# Patient Record
Sex: Male | Born: 1950 | Race: Black or African American | Hispanic: No | Marital: Married | Smoking: Former smoker
Health system: Southern US, Community
[De-identification: ages and names within clinical notes are randomized; demographics above are authoritative.]

## PROBLEM LIST (undated history)

## (undated) DIAGNOSIS — E119 Type 2 diabetes mellitus without complications: Secondary | ICD-10-CM

## (undated) DIAGNOSIS — H43399 Other vitreous opacities, unspecified eye: Secondary | ICD-10-CM

## (undated) DIAGNOSIS — I219 Acute myocardial infarction, unspecified: Secondary | ICD-10-CM

## (undated) DIAGNOSIS — D649 Anemia, unspecified: Secondary | ICD-10-CM

## (undated) DIAGNOSIS — G20 Parkinson's disease: Secondary | ICD-10-CM

## (undated) DIAGNOSIS — G510 Bell's palsy: Secondary | ICD-10-CM

## (undated) DIAGNOSIS — M1990 Unspecified osteoarthritis, unspecified site: Secondary | ICD-10-CM

## (undated) DIAGNOSIS — I10 Essential (primary) hypertension: Secondary | ICD-10-CM

## (undated) HISTORY — DX: Type 2 diabetes mellitus without complications: E11.9

## (undated) HISTORY — DX: Essential (primary) hypertension: I10

## (undated) HISTORY — PX: BACK SURGERY: SHX140

## (undated) HISTORY — DX: Parkinson's disease: G20

## (undated) HISTORY — PX: KNEE SURGERY: SHX244

## (undated) HISTORY — PX: APPENDECTOMY: SHX54

---

## 1998-10-26 ENCOUNTER — Encounter: Payer: Self-pay | Admitting: Gastroenterology

## 1998-10-26 ENCOUNTER — Ambulatory Visit (HOSPITAL_COMMUNITY): Admission: RE | Admit: 1998-10-26 | Discharge: 1998-10-26 | Payer: Self-pay | Admitting: Gastroenterology

## 1999-11-09 ENCOUNTER — Ambulatory Visit (HOSPITAL_COMMUNITY): Admission: RE | Admit: 1999-11-09 | Discharge: 1999-11-09 | Payer: Self-pay | Admitting: *Deleted

## 1999-11-09 ENCOUNTER — Encounter: Payer: Self-pay | Admitting: Internal Medicine

## 2003-07-19 ENCOUNTER — Emergency Department (HOSPITAL_COMMUNITY): Admission: EM | Admit: 2003-07-19 | Discharge: 2003-07-19 | Payer: Self-pay | Admitting: Emergency Medicine

## 2003-07-27 ENCOUNTER — Emergency Department (HOSPITAL_COMMUNITY): Admission: EM | Admit: 2003-07-27 | Discharge: 2003-07-27 | Payer: Self-pay | Admitting: Emergency Medicine

## 2003-08-28 ENCOUNTER — Observation Stay (HOSPITAL_COMMUNITY): Admission: EM | Admit: 2003-08-28 | Discharge: 2003-08-29 | Payer: Self-pay | Admitting: Emergency Medicine

## 2004-08-02 ENCOUNTER — Ambulatory Visit (HOSPITAL_COMMUNITY): Admission: RE | Admit: 2004-08-02 | Discharge: 2004-08-02 | Payer: Self-pay | Admitting: Gastroenterology

## 2005-12-06 ENCOUNTER — Encounter: Admission: RE | Admit: 2005-12-06 | Discharge: 2005-12-06 | Payer: Self-pay | Admitting: Internal Medicine

## 2006-11-11 ENCOUNTER — Inpatient Hospital Stay (HOSPITAL_COMMUNITY): Admission: EM | Admit: 2006-11-11 | Discharge: 2006-11-14 | Payer: Self-pay | Admitting: Emergency Medicine

## 2012-06-14 DIAGNOSIS — G510 Bell's palsy: Secondary | ICD-10-CM | POA: Insufficient documentation

## 2012-08-19 HISTORY — PX: CORONARY ANGIOPLASTY WITH STENT PLACEMENT: SHX49

## 2012-09-21 DIAGNOSIS — I259 Chronic ischemic heart disease, unspecified: Secondary | ICD-10-CM | POA: Insufficient documentation

## 2013-09-05 DIAGNOSIS — N3281 Overactive bladder: Secondary | ICD-10-CM | POA: Insufficient documentation

## 2016-02-26 DIAGNOSIS — I252 Old myocardial infarction: Secondary | ICD-10-CM | POA: Insufficient documentation

## 2016-02-26 DIAGNOSIS — I25118 Atherosclerotic heart disease of native coronary artery with other forms of angina pectoris: Secondary | ICD-10-CM | POA: Insufficient documentation

## 2016-02-26 DIAGNOSIS — R0602 Shortness of breath: Secondary | ICD-10-CM | POA: Insufficient documentation

## 2016-04-27 ENCOUNTER — Encounter: Payer: Self-pay | Admitting: Neurology

## 2016-04-27 ENCOUNTER — Ambulatory Visit (INDEPENDENT_AMBULATORY_CARE_PROVIDER_SITE_OTHER): Payer: Medicare HMO | Admitting: Neurology

## 2016-04-27 VITALS — BP 142/78 | HR 72 | Resp 16 | Ht 69.0 in | Wt 200.0 lb

## 2016-04-27 DIAGNOSIS — G20 Parkinson's disease: Secondary | ICD-10-CM | POA: Diagnosis not present

## 2016-04-27 NOTE — Progress Notes (Signed)
Subjective:    Patient ID: Ronald [name] is a 65 y.o. male.  HPI     Ronald [name] , MD, PhD [facility] [address], [address] [address] [place], [place] [address]  Dear Dr. Melvyn [name],  I saw your patient, Ronald [name], upon your kind request in my neurologic clinic today for initial consultation of his Parkinson's disease. The patient is unaccompanied today. As you know, Mr. Ronald [name] is a 65 yo Left-handed gentleman with an underlying medical history of diabetes, Bell's palsy some 10 years ago, overweight state, and hypertension who was diagnosed with Parkinson's disease in 2014. Symptoms date back to beginning of 2014, and started with speech changes, was noted to dragging his feet.  He has been on Sinemet for the past 3 years, with gradual increase, currently 2 pills in the morning, 2 in the afternoon and 1 at night, was asked to reduce it, He is not sure why, seems to tolerate it well and believes it has helped. He is wondering however if there is any new medication available. He has not been on a dopamine agonist before from what I understand, no Azilect, no other MAO B inhibitor either.  He exercises regularly and is enrolled in the PD spinning class at 2 different [employer]. Lives with his wife of 37 years in [place], and they have 3 grown daughters, 2 GC. He is a nonsmoker, drinks alcohol infrequently, is a retired Environmental managerphotographer.  I reviewed your office note from 02/08/2016. He used to see Dr. Ala [name], neurologist out of [facility] by Dr. Ala [name] retired. He then saw another neurologist in the same office but she left. Prior office records from his previous neurologists are not available for my review today. We will request office records from Dr. Marily [name]'s office, patient signed a ROI form today and is agreeable.  He denies any major memory or mood issues or sleep issues, does snore some, but no apneas reported, no hx of RBD. No FHx of PD.  He varies his C/L dose, 1st dose  6-9 AM, second dose around 12 or 1 PM and last dose around 8 PM.   He also plays golf, once or twice a week. He tries to stay well-hydrated, may not always drink enough water however.  His Past Medical History Is Significant For: Past Medical History:  Diagnosis Date  . Diabetes mellitus without complication (HCC)   . Hypertension   . Parkinson's disease (HCC)     His Past Surgical History Is Significant For: Past Surgical History:  Procedure Laterality Date  . APPENDECTOMY    . CORONARY ANGIOPLASTY WITH STENT PLACEMENT  08/19/2012  . KNEE SURGERY      His Family History Is Significant For: Family History  Problem Relation Age of Onset  . Diabetes Mother   . Stroke Father     His Social History Is Significant For: Social History   Social History  . Marital status: Married    Spouse name: Ronald [name]   . Number of children: 3  . Years of education: N/A   Occupational History  . Retired     Social History Main Topics  . Smoking status: Former Games developermoker  . Smokeless tobacco: None  . Alcohol use Yes  . Drug use: No  . Sexual activity: Not Asked   Other Topics Concern  . None   Social History Narrative   Drinks about 1 cup of coffee a day     His Allergies Are:  No Known Allergies:  His Current Medications Are:  Outpatient Encounter Prescriptions as of 04/27/2016  Medication Sig  . aspirin 81 MG chewable tablet Chew by mouth.  . carbidopa-levodopa (SINEMET IR) 25-100 MG tablet Take 2 tablets by mouth. Take 2 in am, 2 afternoon, and 1 at night.  . Cholecalciferol (VITAMIN D3) 2000 units TABS Take by mouth.  Marland Kitchen lisinopril (PRINIVIL,ZESTRIL) 5 MG tablet Take 1/2 tablet daily  . meloxicam (MOBIC) 7.5 MG tablet   . metFORMIN (GLUCOPHAGE) 500 MG tablet TAKE ONE TABLET BY MOUTH ONCE DAILY WITH BREAKFAST  . metoprolol succinate (TOPROL-XL) 25 MG 24 hr tablet Take by mouth.  . oxybutynin (DITROPAN) 5 MG tablet TAKE ONE TABLET BY MOUTH TWICE DAILY NEEDS  APPT  . simvastatin  (ZOCOR) 40 MG tablet TAKE ONE TABLET BY MOUTH IN THE EVENING   No facility-administered encounter medications on file as of 04/27/2016.   :   Review of Systems:  Out of a complete 14 point review of systems, all are reviewed and negative with the exception of these symptoms as listed below:  Review of Systems  Neurological:       Patient states that he was diagnosed with Parkinson's Disease in 2014 by Ronald [name] at [facility]. Ronald [name] has since retired.  Patient currently takes Sinemet 25-100, 2 tabs in the morning, 2 in the afternoon, and 1 at night.  Patient would like to ask if there are any new treatments for Parkinson's? Patient participates in 2 cycling classes for people with Parkinson's/     Objective:  Neurologic Exam  Physical Exam Physical Examination:   Vitals:   04/27/16 1054  BP: (!) 142/78  Pulse: 72  Resp: 16    General Examination: The patient is a very pleasant 65 y.o. male in no acute distress.  HEENT: Normocephalic, atraumatic, pupils are equal, round and reactive to light and accommodation. Funduscopic exam is normal with sharp disc margins noted. Extraocular tracking shows mild saccadic breakdown without nystagmus noted. There is limitation to upper gaze. There is mild decrease in eye blink rate. Hearing is intact. Face is asymmetric with mild L facial weakness and intermittent L facial twitching. There is mild facial masking and normal facial sensation. There is no lip, neck or jaw tremor. Neck is mildly rigid with intact passive ROM. There are no carotid bruits on auscultation. Oropharynx exam reveals moderate mouth dryness. No significant airway crowding is noted. Mallampati is class II. Tongue protrudes centrally and palate elevates symmetrically. There is no drooling.   Chest: is clear to auscultation without wheezing, rhonchi or crackles noted.  Heart: sounds are regular and normal without murmurs, rubs or gallops noted.   Abdomen: is soft, non-tender  and non-distended with normal bowel sounds appreciated on auscultation.  Extremities: There is no pitting edema in the distal lower extremities bilaterally. Pedal pulses are intact.   Skin: is warm and dry with no trophic changes noted.   Musculoskeletal: exam reveals no obvious joint deformities, tenderness, joint swelling or erythema.  Neurologically:  Mental status: The patient is awake and alert, paying good  attention. He is able to completely provide the history. He is oriented to: person, place, time/date, situation, day of week, month of year and year. His memory, attention, language and knowledge are intact. There is no aphasia, agnosia, apraxia or anomia. There is a no significant bradyphrenia. Speech is mildly hypophonic with no dysarthria noted. Mood is congruent and affect is normal.   Cranial nerves are as described above under HEENT exam. In  addition, shoulder shrug is normal with equal shoulder height noted.  Motor exam: Normal bulk, and strength for age is noted. There are no dyskinesias noted.   he has a mild increase in tone on the right, left muscle tone is fairly normal appearing. He has no significant tremor today, in particular, no resting tremor is noted. Fine motor-wise, he has mild to moderate difficulties on the right with finger taps, hand movements, rapid alternating patting, foot taps and foot agility. Findings arm mild on the left side. Romberg is negative. Reflexes are 1+ throughout. Sensory exam is intact to all modalities in the upper and lower extremities.  Cerebellar testing shows no dysmetria or intention tremor on finger to nose testing. Heel to shin is unremarkable bilaterally. There is no truncal or gait ataxia.   Gait, station and balance: He stands up from the seated position withvery mild difficulty and posture is mildly stooped for age. He has decreased in arm swing while walking, right side more pronounced than left, he has no significant difficulty with  turns and balance is preserved. Tandem walk however is difficult for him.   Assessment and Plan:    In summary, Ronald [name] is a very pleasant 65 y.o.-year old male with an underlying medical history of diabetes, Bell's palsy some 10 years ago, overweight state, and hypertension who  presents for initial consultation and transfer of care for his parkinsonism. His history and physical exam are in keeping with right-sided predominant Parkinson's disease with overall fairly mild to moderate findings, fairly preserved balance, no significant mood disorder, or associated sleep disorder or motor complications. I had a long discussion with the patient about his symptoms, his diagnosis and physical findings. We talked about prognosis as well and treatment options in the future. He has been on levodopa therapy a total of 5 pills at this time. He does not take his medication on a set schedule. We talked about medical treatments and non-pharmacological approaches. We talked about maintaining a healthy lifestyle in general. I encouraged the patient to eat healthy, exercise daily and keep well hydrated, to keep a scheduled bedtime and wake time routine, to not skip any meals and eat healthy snacks in between meals and to have protein with every meal. In particular, I stressed the importance of regular exercise, within of course the patient's own mobility limitations.   As far as further diagnostic testing is concerned, I suggested:I will request office records from his previous neurologist. We may consider doing an MRI in the near future. He may have had an MRI in the remote past when he had his Bell's palsy. He recalls that he was treated for his Bell's palsy with medication including steroids at the time and he has evidence of residual facial weakness.  As far as medications are concerned, I recommended the following at this time: I suggested he continue with Sinemet at the current dose but suggested he try to adhere  to a more set schedule. To that end, I asked him to take 2 pills at 9 AM, 2 pills at 2 PM, and 2 pills at 7 PM. He is advised to stay well-hydrated with water.  There are some additional medications we can consider in the near future. For now, we mutually agreed to not rock the boat. In addition, I would like to see what he may have tried in the past. I would like to see him back in 4 months, sooner as needed. I answered all his questions today and  he was in agreement.   Thank you very much for allowing me to participate in the care of this nice patient. If I can be of any further assistance to you please do not hesitate to call me at [phone].  Sincerely,   Ronald Foley, MD, PhD

## 2016-04-27 NOTE — Patient Instructions (Addendum)
I think your Parkinson's disease has remained fairly stable, which is reassuring. Nevertheless, as you know, this disease does progress with time. It can affect your balance, your memory, your mood, your bowel and bladder function, your posture, balance and walking and your activities of daily living. However, there are good supportive treatments and symptomatic treatments available, so most patients have a change to a good quality life and life expectancy is not typically altered. Overall you are doing fairly well but I do want to suggest a few things today:  Remember to drink plenty of fluid at least 6 glasses (8 oz each), eat healthy meals and do not skip any meals. Try to eat protein with a every meal and eat a healthy snack such as fruit or nuts in between meals. Try to keep a regular sleep-wake schedule and try to exercise daily, particularly in the form of walking, 20-30 minutes a day, if you can.   Taking your medication on schedule is key.   Try to stay active physically and mentally. Engage in social activities in your community and with your family and try to keep up with current events by reading the newspaper or watching the news. Try to do word puzzles and you may like to do puzzles and brain games on the computer such as on [email or web address].   As far as your medications are concerned, I would like to suggest that you take your current medication with the following additional changes:  Take your sinemet at 9 AM, 2 PM and 7 PM.    As far as diagnostic testing, I will order: no test as yet, we may do a brain MRI in the near future.   We will request records from Dr. Marily LenteFord's office.   I would like to see you back in 4 months, sooner if we need to. Please call us with any interim questions, concerns, problems, updates or refill requests.  Our phone number is [phone]. We also have an after hours call service for urgent matters and there is a physician on-call for urgent questions, that  cannot wait till the next work day. For any emergencies you know to call 911 or go to the nearest emergency room.   You can email me through my chart and also leave a phone message for Lafonda Mossesiana, my nurse.

## 2016-06-30 DIAGNOSIS — M5136 Other intervertebral disc degeneration, lumbar region: Secondary | ICD-10-CM | POA: Insufficient documentation

## 2016-07-06 ENCOUNTER — Telehealth: Payer: Self-pay | Admitting: Neurology

## 2016-07-06 MED ORDER — CARBIDOPA-LEVODOPA 25-100 MG PO TABS: 2.0000 | ORAL_TABLET | Freq: Three times a day (TID) | ORAL | 5 refills | Status: DC

## 2016-07-06 NOTE — Telephone Encounter (Signed)
I called patient back and confirmed that he is taking Car/Levo 25-100 mg 2 tab 3 times a day. Ok to refill? Pharmacy is correct in chart.

## 2016-07-06 NOTE — Telephone Encounter (Signed)
Patient requesting refill of carb/levo 25-100mg  qty 180. This was originally filled by Rosario JacksElaine Feraru, MD. but left the practise and he now sees Dr. Frances FurbishAthar. He is good until the end of the week then will run out. He uses [facility] in [place]. Best call back is [phone]

## 2016-07-06 NOTE — Telephone Encounter (Signed)
Patient is returning your call.  

## 2016-07-06 NOTE — Telephone Encounter (Signed)
Rx placed for C/L 2 pills tid.

## 2016-07-06 NOTE — Addendum Note (Signed)
Addended by: Huston FoleyATHAR,  on: 07/06/2016 02:06 PM   Modules accepted: Orders

## 2016-07-06 NOTE — Telephone Encounter (Signed)
I LM for patient to call back. I need to confirm how the patient is taking the Carb/Levo. Qty below does not match with what he told us he was taking at last office visit.

## 2016-08-29 ENCOUNTER — Ambulatory Visit: Payer: Medicare HMO | Admitting: Neurology

## 2016-09-08 ENCOUNTER — Encounter (INDEPENDENT_AMBULATORY_CARE_PROVIDER_SITE_OTHER): Payer: Self-pay | Admitting: Orthopaedic Surgery

## 2016-09-08 ENCOUNTER — Ambulatory Visit (INDEPENDENT_AMBULATORY_CARE_PROVIDER_SITE_OTHER): Payer: Medicare HMO | Admitting: Orthopaedic Surgery

## 2016-09-08 ENCOUNTER — Ambulatory Visit (INDEPENDENT_AMBULATORY_CARE_PROVIDER_SITE_OTHER): Payer: Medicare HMO

## 2016-09-08 VITALS — Ht 69.5 in | Wt 195.0 lb

## 2016-09-08 DIAGNOSIS — M25551 Pain in right hip: Secondary | ICD-10-CM

## 2016-09-08 NOTE — Progress Notes (Signed)
   Office Visit Note   Patient: Ronald [name]           Date of Birth: 11/19/1950           MRN: [id number] Visit Date: 09/08/2016              Requested by: Ronald Chenerrance [name], MD [address] [place], [place] [address] PCP: Ronald ShearerJOHNS, TERRENCE, MD   Assessment & Plan: Visit Diagnoses: Osteoarthritis right hip. We will plan to have Dr. Alvester [name] perform right intra-articular cortisone injection. We'll see back in 1 month  Orders:  No orders of the defined types were placed in this encounter.  No orders of the defined types were placed in this encounter.     Procedures: No procedures performed   Clinical Data: No additional findings.   Subjective: No chief complaint on file.   Pt having right groin pain x 2 months. Hard to walk and all the time.  Pt also has Parkinson's    Right groin pain was insidious in onset without history of injury or trauma. Pain seems to be worse after being on his feet or walking for any length of time. There is no referred pain from his lumbar spine nor is there any pain into the right thigh or knee. Prior history of knee osteoarthritis which presently is asymptomatic.Marland Kitchen. No history of injury or trauma  Ronald [name] has a history of Parkinson's which is relatively well controlled. Review of Systems   Objective: Vital Signs: There were no vitals taken for this visit.  Physical Exam  Ortho Exam straight leg raise is negative bilaterally reflexes were symmetrical. Mild pain at the extreme of internal/external rotation of the right hip but not the left no crepitation. No obvious atrophy of one side compared to the other.  Specialty Comments:  No specialty comments available.  Imaging: No results found.   PMFS History: There are no active problems to display for this patient.  Past Medical History:  Diagnosis Date  . Diabetes mellitus without complication (HCC)   . Hypertension   . Parkinson's disease (HCC)     Family History  Problem  Relation Age of Onset  . Diabetes Mother   . Stroke Father     Past Surgical History:  Procedure Laterality Date  . APPENDECTOMY    . CORONARY ANGIOPLASTY WITH STENT PLACEMENT  08/19/2012  . KNEE SURGERY     Social History   Occupational History  . Retired     Social History Main Topics  . Smoking status: Former Games developermoker  . Smokeless tobacco: Not on file  . Alcohol use Yes  . Drug use: No  . Sexual activity: Not on file

## 2016-09-26 ENCOUNTER — Ambulatory Visit (INDEPENDENT_AMBULATORY_CARE_PROVIDER_SITE_OTHER): Payer: Medicare HMO | Admitting: Physical Medicine and Rehabilitation

## 2016-09-26 ENCOUNTER — Encounter (INDEPENDENT_AMBULATORY_CARE_PROVIDER_SITE_OTHER): Payer: Self-pay | Admitting: Physical Medicine and Rehabilitation

## 2016-09-26 VITALS — BP 155/87 | HR 61

## 2016-09-26 DIAGNOSIS — M25551 Pain in right hip: Secondary | ICD-10-CM | POA: Diagnosis not present

## 2016-09-26 NOTE — Progress Notes (Signed)
   Ronald [name] - 66 y.o. male MRN [id number]  Date of birth: 10/31/1950  Office Visit Note: Visit DatCharlett [name]: 09/26/2016 PCP: Ronald [name], TERRENCE, MD Referred by: Ronald ChenJohns, [name], MD  Subjective: Chief Complaint  Patient presents with  . Right Hip - Pain   HPI: [name] is a 66 year old gentleman with chronic worsening Right hip and groin pain for 2 to 3 months. He reports that this has become worse over time. Worse with increased activity and with getting up from seated position. "throbs like a  Toothache". His case is complicated by Parkinson's disease.    ROS Otherwise per HPI.  Assessment & Plan: Visit Diagnoses:  1. Right hip pain     Plan: Findings:  Diagnostic and hopefully therapeutic right hip anesthetic arthrogram.    Meds & Orders: No orders of the defined types were placed in this encounter.   Orders Placed This Encounter  Procedures  . Large Joint Injection/Arthrocentesis    Follow-up: Return for Scheduled follow-up with Ronald CampbellPeter [name].   Procedures: Anesthetic hip arthrogram Date/Time: 09/26/2016 4:01 PM Performed by: Ronald [name],  Authorized by: Ronald [name],    Consent Given by:  Patient Site marked: the procedure site was marked   Timeout: prior to procedure the correct patient, procedure, and site was verified   Indications:  Pain and diagnostic evaluation Location:  Hip Site:  R hip joint Prep: patient was prepped and draped in usual sterile fashion   Needle Size:  22 G Needle Length:  3.5 inches Approach:  Anterior Ultrasound Guidance: No   Fluoroscopic Guidance: No   Arthrogram: Yes   Medications:  4 mL lidocaine 2 %; 80 mg triamcinolone acetonide 40 MG/ML Aspiration Attempted: Yes   Patient tolerance:  Patient tolerated the procedure well with no immediate complications  Arthrogram demonstrated excellent flow of contrast throughout the joint surface without extravasation or obvious defect.  The patient had relief of symptoms during the  anesthetic phase of the injection.     No notes on file   Clinical History: No specialty comments available.  He reports that he has quit smoking. He has never used smokeless tobacco. No results for input(s): HGBA1C, LABURIC in the last 8760 hours.  Objective:  VS:  HT:    WT:   BMI:     BP:(!) 155/87  HR:61bpm  TEMP: ( )  RESP:  Physical Exam  Musculoskeletal:  Decreased range of motion of the right hip with concordant pain with internal rotation.    Ortho Exam Imaging: No results found.  Past Medical/Family/Surgical/Social History: Medications & Allergies reviewed per EMR There are no active problems to display for this patient.  Past Medical History:  Diagnosis Date  . Diabetes mellitus without complication (HCC)   . Hypertension   . Parkinson's disease (HCC)    Family History  Problem Relation Age of Onset  . Diabetes Mother   . Stroke Father    Past Surgical History:  Procedure Laterality Date  . APPENDECTOMY    . CORONARY ANGIOPLASTY WITH STENT PLACEMENT  08/19/2012  . KNEE SURGERY     Social History   Occupational History  . Retired     Social History Main Topics  . Smoking status: Former Games developermoker  . Smokeless tobacco: Never Used  . Alcohol use Yes  . Drug use: No  . Sexual activity: Not on file

## 2016-09-26 NOTE — Patient Instructions (Signed)

## 2016-09-27 MED ORDER — LIDOCAINE HCL 2 % IJ SOLN
4.0000 mL | INTRAMUSCULAR | Status: AC | PRN
  Administered 2016-09-26: 4 mL

## 2016-09-27 MED ORDER — TRIAMCINOLONE ACETONIDE 40 MG/ML IJ SUSP
80.0000 mg | INTRAMUSCULAR | Status: AC | PRN
  Administered 2016-09-26: 80 mg via INTRA_ARTICULAR

## 2016-10-04 ENCOUNTER — Ambulatory Visit (INDEPENDENT_AMBULATORY_CARE_PROVIDER_SITE_OTHER): Payer: Medicare HMO | Admitting: Orthopaedic Surgery

## 2016-10-04 ENCOUNTER — Encounter (INDEPENDENT_AMBULATORY_CARE_PROVIDER_SITE_OTHER): Payer: Self-pay | Admitting: Orthopaedic Surgery

## 2016-10-04 VITALS — BP 113/69 | HR 65 | Resp 14 | Ht 69.5 in | Wt 195.0 lb

## 2016-10-04 DIAGNOSIS — M1611 Unilateral primary osteoarthritis, right hip: Secondary | ICD-10-CM | POA: Diagnosis not present

## 2016-10-04 DIAGNOSIS — M25551 Pain in right hip: Secondary | ICD-10-CM | POA: Diagnosis not present

## 2016-10-04 NOTE — Progress Notes (Signed)
Office Visit Note   Patient: Ronald [name]           Date of Birth: 03-Nov-1950           MRN: [id number] Visit Date: 10/04/2016              Requested by: Ronald Chen, MD [address] [place], [place] [address] PCP: Ronald Shearer, MD   Assessment & Plan: Visit Diagnoses:  1. Unilateral primary osteoarthritis, right hip   2. Pain of right hip joint     Plan:  He will give [place] a call if you would like to have another injection. Otherwise he can follow back up with [place] when necessary.  Follow-Up Instructions: Return if symptoms worsen or fail to improve.   Orders:  No orders of the defined types were placed in this encounter.  No orders of the defined types were placed in this encounter.     Procedures: No procedures performed   Clinical Data: No additional findings.   Subjective: Chief Complaint  Patient presents with  . Lower Back - Follow-up     History is that Ronald [name] is a 66 year old gentleman with chronic worsening Right hip and groin pain for 2 to 3 months. He reported that this had become worse over time. Worse with increased activity and with getting up from seated position. "throbs like a  Toothache". His case is complicated by Parkinson's disease.  Pt referred to Dr. Alvester [name] for  right Hip injection and was seen by  Dr. Alvester [name] on 09/26/16 . Since the injection he states the pain is minimal but it gets better every day since his injection.    Review of Systems  Constitutional: Negative.   HENT: Negative.   Respiratory: Negative.   Cardiovascular:       History of myocardial infarction past  Gastrointestinal: Negative.   Endocrine:       History of diabetes mellitus  Genitourinary: Negative.   Skin: Negative.   Neurological:       History of Parkinson's  Hematological: Negative.   Psychiatric/Behavioral: Negative.      Objective: Vital Signs: BP 113/69   Pulse 65   Resp 14   Ht 5' 9.5" (1.765 m)   Wt 195 lb (88.5 kg)   BMI  28.38 kg/m   Physical Exam  Constitutional: He is oriented to person, place, and time. He appears well-developed and well-nourished.  HENT:  Head: Normocephalic and atraumatic.  Eyes: EOM are normal. Pupils are equal, round, and reactive to light.  Neck:  No carotid bruits  Cardiovascular: Normal rate.   Pulmonary/Chest: Effort normal.  Neurological: He is alert and oriented to person, place, and time.  Skin: Skin is warm and dry.  Psychiatric: He has a normal mood and affect. His behavior is normal. Judgment and thought content normal.    Right Hip Exam   Range of Motion  Flexion: 110  Internal Rotation: 15  External Rotation: 30   Comments:  Minimal pain with motion at this time.      Specialty Comments:  No specialty comments available.  Imaging: No results found.   PMFS History: There are no active problems to display for this patient.  Past Medical History:  Diagnosis Date  . Diabetes mellitus without complication (HCC)   . Hypertension   . Parkinson's disease (HCC)     Family History  Problem Relation Age of Onset  . Diabetes Mother   . Stroke Father     Past Surgical  History:  Procedure Laterality Date  . APPENDECTOMY    . CORONARY ANGIOPLASTY WITH STENT PLACEMENT  08/19/2012  . KNEE SURGERY     Social History   Occupational History  . Retired     Social History Main Topics  . Smoking status: Former Games developermoker  . Smokeless tobacco: Never Used  . Alcohol use Yes  . Drug use: No  . Sexual activity: Not on file

## 2016-10-10 ENCOUNTER — Ambulatory Visit (INDEPENDENT_AMBULATORY_CARE_PROVIDER_SITE_OTHER): Payer: Medicare HMO | Admitting: Orthopaedic Surgery

## 2016-10-27 ENCOUNTER — Ambulatory Visit: Payer: Medicare HMO | Admitting: Neurology

## 2016-10-27 ENCOUNTER — Telehealth: Payer: Self-pay | Admitting: Neurology

## 2016-10-27 DIAGNOSIS — E785 Hyperlipidemia, unspecified: Secondary | ICD-10-CM | POA: Insufficient documentation

## 2016-10-27 NOTE — Telephone Encounter (Signed)
Pt called said he is having chest pains, he has made an appt with cardiologist this morning at 9am. Says he cannot wait until next available 3/6 to see Dr Frances FurbishAthar. I suggested NP but he wants to see Dr Frances FurbishAthar. Pt said he's had to wait 3 mths to see her today. Please call

## 2016-10-27 NOTE — Telephone Encounter (Signed)
There is nothing available at this time but I will keep an eye out for cancellations.

## 2016-10-31 ENCOUNTER — Encounter: Payer: Self-pay | Admitting: Neurology

## 2016-10-31 ENCOUNTER — Ambulatory Visit (INDEPENDENT_AMBULATORY_CARE_PROVIDER_SITE_OTHER): Payer: Medicare HMO | Admitting: Neurology

## 2016-10-31 VITALS — Resp 20 | Ht 69.0 in | Wt 190.0 lb

## 2016-10-31 DIAGNOSIS — I951 Orthostatic hypotension: Secondary | ICD-10-CM | POA: Diagnosis not present

## 2016-10-31 DIAGNOSIS — G20 Parkinson's disease: Secondary | ICD-10-CM | POA: Diagnosis not present

## 2016-10-31 NOTE — Progress Notes (Signed)
Subjective:    Patient ID: Ronald [name] is a 66 y.o. male.  HPI     Interim history:    Ronald [name] is a 66 yo Left-handed gentleman with an underlying medical history of diabetes, Bell's palsy some 10 years ago, overweight state, CAD with MI in 2013 and 3 stents placed at the time, and hypertension who presents for follow-up consultation of his Parkinson's disease. The patient is unaccompanied today. I first met him on 04/27/2016 at the request of his primary care physician, at which time he reported a prior diagnosis of Parkinson's disease in 2014 and symptoms dating back to early 2014. He was exercising regularly, he was doing well on symptomatic treatment with Sinemet and I suggested he continue with the medication, at 2 pills 3 times a day. He was reminded to stay active physically and mentally and stable hydrated and well-nourished.  Today, 10/31/2016 (all dictated new, as well as above notes, some dictation done in note pad or Word, outside of chart, may appear as copied):  He reports doing okay, no significant issues today, no lightheadedness or chest pain. He denies any new symptoms with respect to his Parkinson's disease such as mood disorder, memory loss, constipation. He tries to hydrate well. Of note, he had an episode of chest pain last week. He had to cancel his appointment with [place] here because of the chest pain. He saw his Cardiologist and had an EKG. He was a little dehydrated, BP in the 110s/60s at the time and Ronald [name] was noted, started on low dose northera 100 mg tid, but has not filled it yet, waiting for ins auth. I reviewed Ronald [name] note from 10/27/16. He has an appointment with outpatient cardiology next week for FU. He has been [facility]ing[facility]. Lives in [place] and goes to the [employer] and [employer]. Looked into doing the boxing classes for PD. He has some loss of appetite and lost weight.   The patient's allergies, current medications,  family history, past medical history, past social history, past surgical history and problem list were reviewed and updated as appropriate.   Previously (copied from previous notes for reference):   04/27/2016: He was diagnosed with Parkinson's disease in 2014. Symptoms date back to beginning of 2014, and started with speech changes, was noted to dragging his feet.  He has been on Sinemet for the past 3 years, with gradual increase, currently 2 pills in the morning, 2 in the afternoon and 1 at night, was asked to reduce it, He is not sure why, seems to tolerate it well and believes it has helped. He is wondering however if there is any new medication available. He has not been on a dopamine agonist before from what I understand, no Azilect, no other MAO B inhibitor either.  He exercises regularly and is enrolled in the PD spinning class at [facility] [employer]. Lives with his wife of 93 years in [place], and they have 3 grown daughters, 2 GC. He is a nonsmoker, drinks alcohol infrequently, is a retired Geophysicist/field seismologist.  I reviewed your office note from 02/08/2016. He used to see Dr. Marijean Bravo, neurologist out of [facility] by Dr. Marijean Bravo retired. He then saw another neurologist in the same office but she left. Prior office records from his previous neurologists are not available for my review today. We will request office records from Dr. Barbera Setters office, patient signed a ROI form today and is agreeable.   He denies any major  memory or mood issues or sleep issues, does snore some, but no apneas reported, no hx of RBD. No FHx of PD.  He varies his C/L dose, 1st dose 6-9 AM, second dose around 12 or 1 PM and last dose around 8 PM.    He also plays golf, once or twice a week. He tries to stay well-hydrated, may not always drink enough water however.  His Past Medical History Is Significant For: Past Medical History:  Diagnosis Date  . Diabetes mellitus without complication ([place])   . Hypertension   . Parkinson's  disease ([place])     His Past Surgical History Is Significant For: Past Surgical History:  Procedure Laterality Date  . APPENDECTOMY    . CORONARY ANGIOPLASTY WITH STENT PLACEMENT  08/19/2012  . KNEE SURGERY      His Family History Is Significant For: Family History  Problem Relation Age of Onset  . Diabetes Mother   . Stroke Father     His Social History Is Significant For: Social History   Social History  . Marital status: Married    Spouse name: Memory Dance   . Number of children: 3  . Years of education: N/A   Occupational History  . Retired     Social History Main Topics  . Smoking status: Former Research scientist (life sciences)  . Smokeless tobacco: Never Used  . Alcohol use Yes  . Drug use: No  . Sexual activity: Not Asked   Other Topics Concern  . None   Social History Narrative   Drinks about 1 cup of coffee a day     His Allergies Are:  No Known Allergies:   His Current Medications Are:  Outpatient Encounter Prescriptions as of 10/31/2016  Medication Sig  . aspirin 81 MG chewable tablet Chew by mouth.  . carbidopa-levodopa (SINEMET IR) 25-100 MG tablet Take 2 tablets by mouth 3 (three) times daily.  Marland Kitchen lisinopril (PRINIVIL,ZESTRIL) 5 MG tablet Take 1 tablet daily.  . meloxicam (MOBIC) 7.5 MG tablet   . metFORMIN (GLUCOPHAGE) 500 MG tablet TAKE ONE TABLET BY MOUTH ONCE DAILY WITH BREAKFAST  . metoprolol succinate (TOPROL-XL) 25 MG 24 hr tablet Take by mouth.  . oxybutynin (DITROPAN) 5 MG tablet TAKE ONE TABLET BY MOUTH TWICE DAILY NEEDS  APPT  . simvastatin (ZOCOR) 40 MG tablet TAKE ONE TABLET BY MOUTH IN THE EVENING  . [DISCONTINUED] Cholecalciferol (VITAMIN D3) 2000 units TABS Take by mouth.   No facility-administered encounter medications on file as of 10/31/2016.   :  Review of Systems:  Out of a complete 14 point review of systems, all are reviewed and negative with the exception of these symptoms as listed below: Review of Systems  Neurological:       Pt presents today to  discuss his parkinson's disease. Pt says that he has not noticed any changes with his parkinson's. Pt did have chest pain last week.    Objective:  Neurologic Exam  Physical Exam Physical Examination:   Vitals:   10/31/16 0920  Resp: 20   On orthostatic blood pressure and pulse testing: lying down blood pressure was 178/84 with a pulse of 88, sitting 185/92 with a pulse of 57, standing 162/92 with a pulse of 62.   General Examination: The patient is a very pleasant 66 y.o. male in no acute distress. He appears well-developed and well-nourished and very well groomed.   HEENT: Normocephalic, atraumatic, pupils are equal, round and reactive to light and accommodation. Extraocular tracking is good without  limitation to gaze excursion or nystagmus noted. Normal smooth pursuit is noted. Hearing is grossly intact. Face is mildly asymmetric with left-sided facial weakness noted an intermittent left lower facial twitching. This appears to be stable. Overall, he has mild facial masking and normal facial sensation. Neck is mildly rigid with good range of motion passively and actively. He has mild hypophonia, no dysarthria. There is no lip, neck/head, jaw or voice tremor. Oropharynx exam reveals: mild mouth dryness, adequate dental hygiene and mild airway crowding. Mallampati is class II. Tongue protrudes centrally and palate elevates symmetrically.   Chest: Clear to auscultation without wheezing, rhonchi or crackles noted.  Heart: S1+S2+0, regular and normal without murmurs, rubs or gallops noted.   Abdomen: Soft, non-tender and non-distended with normal bowel sounds appreciated on auscultation.  Extremities: There is no pitting edema in the distal lower extremities bilaterally. Pedal pulses are intact.  Skin: Warm and dry without trophic changes noted.  Musculoskeletal: exam reveals no obvious joint deformities, tenderness or joint swelling or erythema.   Neurologically:  Mental status: The  patient is awake, alert and oriented in all 4 spheres. His immediate and remote memory, attention, language skills and fund of knowledge are appropriate. There is no evidence of aphasia, agnosia, apraxia or anomia. Speech is clear with normal prosody and enunciation. Thought process is linear. Mood is normal and affect is normal.  Cranial nerves II - XII are as described above under HEENT exam. In addition: shoulder shrug is normal with equal shoulder height noted. Motor exam: Normal bulk, strength for age, no drift or rebound, very mild intermittent resting tremor in the left upper extremity, no other resting tremor noted. He has mild to moderate difficulty with fine motor skills on the right including finger taps, hand movements and rapid alternating patting, foot taps and foot agility as well. On the left, he has mild to minimal findings. Romberg is negative. Reflexes are 1+. Sensory exam intact to light touch throughout. He stands without significant difficulty and denies any lightheadedness. Earlier with orthostatic blood pressure testing he had mild lightheadedness. He has a decreased arm swing when walking, posture is age-appropriate or mildly stooped. He has mild trouble turning, tandem walk is not testable safely.  Assessment and Plan:   In summary, Othniel [name] is a very pleasant 66 y.o.-year oldLeft-handed gentleman with an underlying medical history of coronary artery disease, status post stent placement, history of left-sided Bell's palsy, overweight state, hypertension, type 2 diabetes, who presents for follow-up consultation of his right-sided predominant Parkinson's disease, stable findings noted today but evidence of orthostatic hypotension, most likely linked to his underlying PD. We talked about this at length today. He recently saw his cardiologist a week ago and is supposed to start Northera 100 mg tid, per cards. He is still waiting on insurance authorization for this. He is also  advised to stay well-hydrated and change positions slowly. He is also encouraged to try over-the-counter compression stockings up to the knees. He is very active physically and mentally. He is commended for his overall healthy lifestyle. He has lost about 10 pounds in the past 6 months. This may very well be secondary to his Parkinson's disease and that patients with PD tends to eat slower and have slower GI motility. He may feel fuller more easily and may not be eating as much as before. We will monitor. I would not recommend any supplementation as yet. He is advised to eat healthy and focus on healthy protein intake as well.  He has been taking Sinemet 3 times a day for a total of 5 pills. He is encouraged to take 2 pills 3 times a day, at 9 AM, 2 PM and 7 PM. We can provide a 90 day prescription next time he is a refill. He has no need for a refill at this time.  I suggested a 6 month follow-up, sooner as needed. I answered all his questions today and he was in agreement.  I spent 25 minutes in total face-to-face time with the patient, more than 50% of which was spent in counseling and coordination of care, reviewing test results, reviewing medication and discussing or reviewing the diagnosis of PD, its prognosis and treatment options. Pertinent laboratory and imaging test results that were available during this visit with the patient were reviewed by me and considered in my medical decision making (see chart for details).

## 2016-10-31 NOTE — Telephone Encounter (Signed)
Patient has called office to confirm he will be at the office for an appt with Dr. Frances FurbishAthar at 9:30am.

## 2016-10-31 NOTE — Telephone Encounter (Signed)
There is an opening this morning at 9:30am for Dr. Frances FurbishAthar. I called pt, left a detailed message advising him of this opening, and asked that he call me back if he would like this appt.

## 2016-10-31 NOTE — Patient Instructions (Addendum)
Your exam is stable, I would like to continue with the Sinemet at 2 pills 3 times a day at 9 AM, 2 PM and 7 PM.  You do have a drop in the systolic blood pressure number (the top number), which can cause symptoms such as feeling dizzy, lightheaded, lethargic, or just generally unwell. Stay well hydrated and change position slowly, may consider using over the counter compression socks.  You are supposed to start Northera per Dr. Rhona Leavenshiu.

## 2016-11-01 ENCOUNTER — Encounter: Payer: Self-pay | Admitting: Neurology

## 2016-11-10 DIAGNOSIS — R42 Dizziness and giddiness: Secondary | ICD-10-CM | POA: Insufficient documentation

## 2017-01-30 ENCOUNTER — Other Ambulatory Visit: Payer: Self-pay | Admitting: Neurology

## 2017-05-02 ENCOUNTER — Ambulatory Visit (INDEPENDENT_AMBULATORY_CARE_PROVIDER_SITE_OTHER): Payer: Medicare HMO | Admitting: Neurology

## 2017-05-02 ENCOUNTER — Encounter: Payer: Self-pay | Admitting: Neurology

## 2017-05-02 VITALS — BP 120/72 | HR 72 | Ht 69.0 in | Wt 197.0 lb

## 2017-05-02 DIAGNOSIS — G8929 Other chronic pain: Secondary | ICD-10-CM | POA: Diagnosis not present

## 2017-05-02 DIAGNOSIS — G20 Parkinson's disease: Secondary | ICD-10-CM

## 2017-05-02 DIAGNOSIS — M545 Low back pain: Secondary | ICD-10-CM

## 2017-05-02 DIAGNOSIS — G20A1 Parkinson's disease without dyskinesia, without mention of fluctuations: Secondary | ICD-10-CM

## 2017-05-02 MED ORDER — CARBIDOPA-LEVODOPA 25-100 MG PO TABS: 2.0000 | ORAL_TABLET | Freq: Three times a day (TID) | ORAL | 3 refills | Status: DC

## 2017-05-02 NOTE — Progress Notes (Signed)
Subjective:    Patient ID: Ronald [name] is a 66 y.o. male.  HPI     Interim history:   Ronald [name] is a 66 yo Left-handed gentleman with an underlying medical history of diabetes, Bell's palsy some 10 years ago, overweight state, CAD with MI in 2013 and 3 stents placed at the time, and hypertension who presents for follow-up consultation of his Parkinson's disease. The patient is accompanied by 2 younger Ronald [name] today. I last saw him on 10/31/2016, at which time he reported doing okay, no recent new symptoms, in particular no significant Ronald loss or mood disorder constipation. He did have an episode of chest pain recently. He saw his cardiologist. He felt to be a little dehydrated and blood pressure was on the lower end of normal. He was noted to have some orthostatic hypotension. He was started on northera 100 mg tid, but had not filled it yet. He was trying to stay active and going to the [facility] 3 times a week. He was also interested in pursuing boxing classes for PD. He had lost a little bit of weight and reported some loss of appetite. I suggested he continue with Sinemet 2 pills 3 times a day. Advised to change positions slowly and be mindful of his blood pressure dropping potentially with sudden changes of position and he was reminded to stay well-hydrated.  Today, 05/02/2017: He reports doing okay, but has had some aching in the lower back area bilaterally, not much in the way of lateralization or sciatica type symptoms. He had lower back epidural injection last year around December. He has seen Ronald [name] for this. He also had injection into his right knee in the past. Knee does not bother him very much, he had an x-ray of both hips in December 2017 which I reviewed, had some arthritic changes on the right. He has not fallen. Overall from the Parkinson's standpoint he feels stable, takes Sinemet 2 pills 3 times a day, doesn't look like he ever started northera per cardiologist. He has no other  new symptoms, in particular no mood or Ronald related complaints. He stays active, likes to play golf. Nevertheless, low back pain does bother him and he takes Aleve nearly on a daily basis.   The patient's allergies, current medications, family history, past medical history, past social history, past surgical history and problem list were reviewed and updated as appropriate.    Previously (copied from previous notes for reference):     I first met him on 04/27/2016 at the request of his primary care physician, at which time he reported a prior diagnosis of Parkinson's disease in 2014 and symptoms dating back to early 2014. He was exercising regularly, he was doing well on symptomatic treatment with Sinemet and I suggested he continue with the medication, at 2 pills 3 times a day. He was reminded to stay active physically and mentally and stable hydrated and well-nourished.     04/27/2016: He was diagnosed with Parkinson's disease in 2014. Symptoms date back to beginning of 2014, and started with speech changes, was noted to dragging his feet.  He has been on Sinemet for the past 3 years, with gradual increase, currently 2 pills in the morning, 2 in the afternoon and 1 at night, was asked to reduce it, He is not sure why, seems to tolerate it well and believes it has helped. He is wondering however if there is any new medication available. He has not been on a dopamine agonist  before from what I understand, no Azilect, no other MAO B inhibitor either.  He exercises regularly and is enrolled in the PD spinning class [facility] [employer]. Lives with his wife of 20 years in [place], and they have 3 grown daughters, 2 GC. He is a nonsmoker, drinks alcohol infrequently, is a retired Geophysicist/field seismologist.  I reviewed your office note from 02/08/2016. He used to see Dr. Marijean Bravo, neurologist out of [facility] by Dr. Marijean Bravo retired. He then saw another neurologist in the same office but she left. Prior office records  from his previous neurologists are not available for my review today. We will request office records from Dr. Barbera Setters office, patient signed a ROI form today and is agreeable.   He denies any major Ronald or mood issues or sleep issues, does snore some, but no apneas reported, no hx of RBD. No FHx of PD.  He varies his C/L dose, 1st dose 6-9 AM, second dose around 12 or 1 PM and last dose around 8 PM.    He also plays golf, once or twice a week. He tries to stay well-hydrated, may not always drink enough water however.   His Past Medical History Is Significant For: Past Medical History:  Diagnosis Date  . Diabetes mellitus without complication ([place])   . Hypertension   . Parkinson's disease ([place])     His Past Surgical History Is Significant For: Past Surgical History:  Procedure Laterality Date  . APPENDECTOMY    . CORONARY ANGIOPLASTY WITH STENT PLACEMENT  08/19/2012  . KNEE SURGERY      His Family History Is Significant For: Family History  Problem Relation Age of Onset  . Diabetes Mother   . Stroke Father     His Social History Is Significant For: Social History   Social History  . Marital status: Married    Spouse name: Ronald [name]   . Number of children: 3  . Years of education: N/A   Occupational History  . Retired     Social History Main Topics  . Smoking status: Former Research scientist (life sciences)  . Smokeless tobacco: Never Used  . Alcohol use Yes  . Drug use: No  . Sexual activity: Not Asked   Other Topics Concern  . None   Social History Narrative   Drinks about 1 cup of coffee a day     His Allergies Are:  No Known Allergies:   His Current Medications Are:  Outpatient Encounter Prescriptions as of 05/02/2017  Medication Sig  . aspirin 81 MG chewable tablet Chew by mouth.  . carbidopa-levodopa (SINEMET IR) 25-100 MG tablet TAKE TWO TABLETS BY MOUTH THREE TIMES DAILY  . lisinopril (PRINIVIL,ZESTRIL) 5 MG tablet Take 1 tablet daily.  . meloxicam (MOBIC) 7.5 MG tablet   .  metFORMIN (GLUCOPHAGE) 500 MG tablet TAKE ONE TABLET BY MOUTH ONCE DAILY WITH BREAKFAST  . metoprolol succinate (TOPROL-XL) 25 MG 24 hr tablet Take by mouth.  . oxybutynin (DITROPAN) 5 MG tablet TAKE ONE TABLET BY MOUTH TWICE DAILY NEEDS  APPT  . sertraline (ZOLOFT) 50 MG tablet Take 50 mg by mouth at bedtime.  . simvastatin (ZOCOR) 40 MG tablet TAKE ONE TABLET BY MOUTH IN THE EVENING   No facility-administered encounter medications on file as of 05/02/2017.   :  Review of Systems:  Out of a complete 14 point review of systems, all are reviewed and negative with the exception of these symptoms as listed below:  Review of Systems  Neurological:  Pt presents today to discuss his PD. Pt reports muscle aches and pains.    Objective:  Neurological Exam  Physical Exam Physical Examination:   Vitals:   05/02/17 0929  BP: 120/72  Pulse: 72    General Examination: The patient is a very pleasant 66 y.o. male in no acute distress. He appears well-developed and well-nourished and well groomed.   HEENT: Normocephalic, atraumatic, pupils are equal, round and reactive to light and accommodation. Extraocular tracking is good without limitation to gaze excursion or nystagmus noted. Normal smooth pursuit is noted. Hearing is grossly intact. Face is mildly asymmetric with left-sided facial weakness noted an intermittent left lower facial twitching. This appears to be stable from before. Overall, he has mild facial masking and normal facial sensation. Neck is mildly rigid with good range of motion passively and actively. He has mild hypophonia, no dysarthria. There is no lip, neck/head, jaw or voice tremor. Oropharynx exam reveals: mild mouth dryness, adequate dental hygiene and mild airway crowding. Mallampati is class II. Tongue protrudes centrally and palate elevates symmetrically.   Chest: Clear to auscultation without wheezing, rhonchi or crackles noted.  Heart: S1+S2+0, regular and normal  without murmurs, rubs or gallops noted.   Abdomen: Soft, non-tender and non-distended with normal bowel sounds appreciated on auscultation.  Extremities: There is no pitting edema in the distal lower extremities bilaterally. Pedal pulses are intact.  Skin: Warm and dry without trophic changes noted.  Musculoskeletal: exam reveals no obvious joint deformities, tenderness or joint swelling or erythema, with the exception of slightly larger appearing right knee but it is not tender and midline low back pain, paraspinal muscle areas particularly.   Neurologically:  Mental status: The patient is awake, alert and oriented in all 4 spheres. His immediate and remote Ronald, attention, language skills and fund of knowledge are appropriate. There is no evidence of aphasia, agnosia, apraxia or anomia. Speech is clear with normal prosody and enunciation. Thought process is linear. Mood is normal and affect is normal.  Cranial nerves II - XII are as described above under HEENT exam. In addition: shoulder shrug is normal with equal shoulder height noted. Motor exam: Normal bulk, strength for age, no drift or rebound, no resting tremor today. He has mild to moderate difficulty with fine motor skills on the right including finger taps, hand movements and rapid alternating patting, foot taps and foot agility as well. On the left, he has mild to minimal findings. Romberg is negative. Reflexes are 1+. Sensory exam intact to light touch throughout. He stands without significant difficulty and denies any lightheadedness. Earlier with orthostatic blood pressure testing he had mild lightheadedness. He has a decreased arm swing when walking, posture is age-appropriate or mildly stooped. He has mild trouble turning, tandem walk is not testable safely.  Assessment and Plan:   In summary, Maliq Pilley is a very pleasant 66 year old left-handed gentleman with an underlying medical history of coronary artery disease,  status post stent placement, history of left-sided Bell's palsy, overweight state, degenerative back d/s, with s/p ESI about a year ago, s/p R knee injection, hypertension, type 2 diabetes, who presents for follow-up consultation of his right-sided predominant Parkinson's disease, With overall stable findings. He had some issues with orthostatic hypotension, was supposed to start Northera 100 mg tid, per his cardiologist but it looks like he never did start the medication. His blood pressure looks good today, he does not have any significant symptoms of orthostatic lightheadedness. He is advised to continue  to stay well-hydrated and physically active. For his low back pain he is encouraged to make another appointment with Ronald [name]. I suggested he continue with Sinemet 2 pills 3 times a day and I renewed his prescription for 90 days with refills. I suggested a 6 month follow-up, sooner as needed. I answered all his questions today and he was in agreement.  I spent 25 minutes in total face-to-face time with the patient, more than 50% of which was spent in counseling and coordination of care, reviewing test results, reviewing medication and discussing or reviewing the diagnosis of PD, its prognosis and treatment options. Pertinent laboratory and imaging test results that were available during this visit with the patient were reviewed by me and considered in my medical decision making (see chart for details).

## 2017-05-02 NOTE — Patient Instructions (Signed)
Good to see you again today. You look well!  For your low back pain I would recommend you make another appointment with Dr. Cleophas DunkerWhitfield. He may be able to do another epidural steroid injection.  The way you are describing her symptoms may indicate degenerative lower back disease. Nevertheless, I would recommend you stay off your cholesterol medicine as recommended by her primary care physician for now.   From from the Parkinson's standpoint you look stable and have mild signs of Parkinson's disease affecting primarily the right side. Your blood pressure looks good. I would recommend we continue with your Sinemet as is, 2 pills 3 times a day, I renewed your prescription for 90 days supply with refills. I will see you back in about 6 months, sooner as needed.

## 2017-06-19 ENCOUNTER — Ambulatory Visit (INDEPENDENT_AMBULATORY_CARE_PROVIDER_SITE_OTHER): Payer: Medicare HMO | Admitting: Orthopaedic Surgery

## 2017-07-03 DIAGNOSIS — D638 Anemia in other chronic diseases classified elsewhere: Secondary | ICD-10-CM | POA: Insufficient documentation

## 2017-08-04 DIAGNOSIS — M62830 Muscle spasm of back: Secondary | ICD-10-CM | POA: Insufficient documentation

## 2017-08-04 DIAGNOSIS — M47819 Spondylosis without myelopathy or radiculopathy, site unspecified: Secondary | ICD-10-CM | POA: Insufficient documentation

## 2017-08-04 DIAGNOSIS — G894 Chronic pain syndrome: Secondary | ICD-10-CM | POA: Insufficient documentation

## 2017-08-19 HISTORY — PX: OTHER SURGICAL HISTORY: SHX169

## 2017-08-21 DIAGNOSIS — M5416 Radiculopathy, lumbar region: Secondary | ICD-10-CM | POA: Insufficient documentation

## 2017-09-08 ENCOUNTER — Ambulatory Visit (INDEPENDENT_AMBULATORY_CARE_PROVIDER_SITE_OTHER): Payer: Medicare HMO | Admitting: Orthopaedic Surgery

## 2017-09-08 ENCOUNTER — Other Ambulatory Visit (INDEPENDENT_AMBULATORY_CARE_PROVIDER_SITE_OTHER): Payer: Self-pay

## 2017-09-08 ENCOUNTER — Encounter (INDEPENDENT_AMBULATORY_CARE_PROVIDER_SITE_OTHER): Payer: Self-pay | Admitting: Orthopaedic Surgery

## 2017-09-08 VITALS — BP 141/74 | HR 66 | Resp 18

## 2017-09-08 DIAGNOSIS — M5442 Lumbago with sciatica, left side: Secondary | ICD-10-CM

## 2017-09-08 DIAGNOSIS — M5441 Lumbago with sciatica, right side: Secondary | ICD-10-CM

## 2017-09-08 DIAGNOSIS — G8929 Other chronic pain: Secondary | ICD-10-CM

## 2017-09-08 DIAGNOSIS — M545 Low back pain: Principal | ICD-10-CM

## 2017-09-08 NOTE — Progress Notes (Signed)
Office Visit Note   Patient: Ronald [name] Anes           Date of Birth: 05/04/1951           MRN: [id number] Visit Date: 09/08/2017              Requested by: Woodroe ChenJohns, [name], MD [address] [place], [place] [id number] PCP: Woodroe ChenJohns, [name], MD   Assessment & Plan: Visit Diagnoses:  1. Chronic bilateral low back pain with bilateral sciatica     Plan: Repeat epidural steroid injection. Ronald [name] has degenerative changes lumbar spine associated with spinal stenosis. He has a back support. He's artery had one injection and is on appropriate medicines. He's also been seen [facility]. Plan to see him back over the next 4 weeks or so. Follow-Up Instructions: Return if symptoms worsen or fail to improve.   Orders:  No orders of the defined types were placed in this encounter.  No orders of the defined types were placed in this encounter.     Procedures: No procedures performed   Clinical Data: No additional findings.   Subjective: Chief Complaint  Patient presents with  . Back Pain  Ronald [name] is a long-term friend that presents with chronic low back and bilateral lower extremity pain. 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 More recently has had a chronic problem with his back with referred discomfort into both lower extremities. He appears to have claudication. He's been evaluated by a spine surgeons within of on health system and had an MRI scan recently that demonstrated extensive diffuse degenerative changes throughout the lumbar spine. He has central canal stenosis greatest between L2 and 3 wear was moderate to advanced secondary to disc bulge and posterior hypertrophic changes. He also had mild to moderate central canal stenosis at L3-4 secondary to disc bulge and posterior disc protrusion and extensive foraminal stenosis at virtually every level spine. He's had 1 epidural steroid  injection that he notes may have helped "10%. He wears a back support. He's been through a course of physical therapy. He does have tramadol for pain. He like to consider another epidural steroid injection but they don't have any time until well after February through the [facility].. Noris also has history of diabetes, Bell's palsy affecting the left side of his face and Parkinson's  HPI  Review of Systems  Constitutional: Positive for fatigue.  Gastrointestinal: Positive for abdominal pain.  Musculoskeletal: Positive for back pain and gait problem.  Neurological: Positive for dizziness and light-headedness.  All other systems reviewed and are negative.    Objective: Vital Signs: BP (!) 141/74 (BP Location: Right Arm, Patient Position: Sitting, Cuff Size: Normal)   Pulse 66   Resp 18   Physical Exam  Ortho Exam awake alert and oriented 3 comfortable sitting. Some distortion of the left side of his face. Not having any obvious shaking of pill-rolling tremor recurrent related to his Parkinson's today. Straight leg raise is negative bilaterally. Painless range of motion of both hips. No percussible tenderness of the lumbar spine. He doesn't have any loss of sensibility in either foot does not experience any neuropathy related to his diabetes. No knee pain today.  Specialty Comments:  No specialty comments available.  Imaging: No results found.   PMFS History: There are no active problems to display for this patient.  Past Medical History:  Diagnosis Date  .  Diabetes mellitus without complication (HCC)   . Hypertension   . Parkinson's disease (HCC)     Family History  Problem Relation Age of Onset  . Diabetes Mother   . Stroke Father     Past Surgical History:  Procedure Laterality Date  . APPENDECTOMY    . back epidural   08/2017  . CORONARY ANGIOPLASTY WITH STENT PLACEMENT  08/19/2012  . KNEE SURGERY     Social History   Occupational History  . Occupation:  Retired   Tobacco Use  . Smoking status: Former Games developermoker  . Smokeless tobacco: Never Used  Substance and Sexual Activity  . Alcohol use: Yes  . Drug use: No  . Sexual activity: Not on file

## 2017-10-04 ENCOUNTER — Encounter (INDEPENDENT_AMBULATORY_CARE_PROVIDER_SITE_OTHER): Payer: Self-pay | Admitting: Physical Medicine and Rehabilitation

## 2017-10-04 ENCOUNTER — Ambulatory Visit (INDEPENDENT_AMBULATORY_CARE_PROVIDER_SITE_OTHER): Payer: Medicare HMO | Admitting: Physical Medicine and Rehabilitation

## 2017-10-04 ENCOUNTER — Ambulatory Visit (INDEPENDENT_AMBULATORY_CARE_PROVIDER_SITE_OTHER): Payer: Medicare HMO

## 2017-10-04 VITALS — BP 144/82 | HR 76 | Temp 97.7°F

## 2017-10-04 DIAGNOSIS — M48062 Spinal stenosis, lumbar region with neurogenic claudication: Secondary | ICD-10-CM

## 2017-10-04 MED ORDER — BETAMETHASONE SOD PHOS & ACET 6 (3-3) MG/ML IJ SUSP
12.0000 mg | Freq: Once | INTRAMUSCULAR | Status: AC
  Administered 2017-10-04: 12 mg

## 2017-10-04 NOTE — Progress Notes (Deleted)
Pt states sharp pain in his lower back that radiates to both legs, more on the left leg than right leg. Pt states pain has been going on for about 6 months. Pt states last injection helped for about 6 months, pt states in injection in [place] in December 2018 that did not help. Pt states getting up makes it worse, sitting down makes pain better. +Driver, -BT, -Dye Allergies.

## 2017-10-04 NOTE — Patient Instructions (Signed)

## 2017-10-05 NOTE — Progress Notes (Signed)
Ronald [name] - 67 y.o. male MRN [id number]  Date of birth: 11-Jul-1951  Office Visit Note: Visit Date: 10/04/2017 PCP: Ronald Chen, MD Referred by: Ronald Chen, MD  Subjective: Chief Complaint  Patient presents with  . Lower Back - Pain  . Right Thigh - Pain  . Left Thigh - Pain   HPI: Ronald [name] is a 67 year old gentleman very pleasant who is here with his wife who I have seen in the past.  He is followed by Ronald [name] in our practice who he has known for quite some time.  He is also followed by Ronald [name], [facility] neurosurgeon who used to be at [place] and also Ronald [name] who is a anesthesiology pain management physician that used to be in [place] but who is now in [place] for [facility].  The story is a little bit convoluted basically his primary care physician had referred him to Ronald [name] who reviewed his problems with his back and lumbar spine and suggested facet joint/medial branch blocks diagnostically.  The patient then was seen by Ronald [name] and at that time instead of doing facet blocks or medial branch blocks they decided to complete epidural injection do to the fact that the patient was scheduled to go to [place] on a trip.  This decision was made because the medial branch blocks are obviously a temporary test if you will to ultimately complete radiofrequency ablation of the same joints.  Patient states that the epidural injection which was a bilateral L3 transforaminal injection did not give him much relief at all.  He states that it really did not help throughout the trip.  Reading the notes physician assistant associated with Ronald [name] injection sometime in February.  Whether this was going to be a repeat over the medial branch blocks is equivocal.  The patient is having mainly axial low back pain worse with trying to get up in the mornings and going from sit to stand.  He has a hard time standing for any length of time and does get better at rest.  He  does report some pain that radiates worse into the posterior lateral anterior lateral legs somewhat of an L3 distribution but also somewhat of an L5 distribution.  His wife indicates that he complains mainly of his back pain.  We did review the MRI with the patient today and this is reviewed below.  He reports this onset of pain sometime around 6 months ago without specific injury.  Does report the left leg bothers him more than the right.  He has felt weak at times in the legs but no foot drop or focal weakness.  He has had no other red flag symptoms.    Review of Systems  Constitutional: Negative for chills, fever, malaise/fatigue and weight loss.  HENT: Negative for hearing loss and sinus pain.   Eyes: Negative for blurred vision, double vision and photophobia.  Respiratory: Negative for cough and shortness of breath.   Cardiovascular: Negative for chest pain, palpitations and leg swelling.  Gastrointestinal: Negative for abdominal pain, nausea and vomiting.  Genitourinary: Negative for flank pain.  Musculoskeletal: Positive for back pain and joint pain. Negative for myalgias.       Lateral left more than right leg pain  Skin: Negative for itching and rash.  Neurological: Negative for tremors, focal weakness and weakness.  Endo/Heme/Allergies: Negative.   Psychiatric/Behavioral: Negative for depression.  All other systems reviewed and are negative.  Otherwise per HPI.  Assessment & Plan:  Visit Diagnoses:  1. Spinal stenosis of lumbar region with neurogenic claudication     Plan: Findings:  After speaking to the patient directly today for more than 25 minutes about his general pain complaints and the work that has been done to this point with his spine.  We discussed the type of condition that he has standard management of this.  This basically confirmed what Dr. Petra KubaKilpatrick essentially told him at the time.  He will continue to follow with her from a surgical standpoint.  At this point the  patient has symptoms consistent both with lumbar stenosis but also lumbar facet arthropathy.  It is discussion of the events thus far end of his admission and MRI do think it would be pertinent to try an L3-4 interlaminar epidural injection just to see diagnostically how much relief he gets.  He gets a lot of relief with this and will point to the stenosis is being more of the issue.  If it is 1 level stenosis and they can do a decompression procedure that would be definitive.  If he does not get much relief with this I would be happy to complete diagnostic medial branch blocks of the lower facet joints from a diagnostic perspective and follow through with radiofrequency ablation.  We could obviously have that done by Dr. Courtney ParisSchutte as well.  He basically wanted to get in with us because we could see him quicker.  Dr. Cleophas DunkerWhitfield has seen the patient for quite some time and we did try to get the patient in in a timely fashion.  Her Ronald DunkerWhitfield unfortunately was under the impression that he had had facet joint blocks that were not helpful and suggested epidural injection.  Nonetheless, we have cleared up the issue at this point and I do think will give him some relief pursuing 1 of these procedures.  His case is further complicated by Parkinson's disease as well as diabetes.    Meds & Orders:  Meds ordered this encounter  Medications  . betamethasone acetate-betamethasone sodium phosphate (CELESTONE) injection 12 mg    Orders Placed This Encounter  Procedures  . XR C-ARM NO REPORT  . Epidural Steroid injection    Follow-up: Return if symptoms worsen or fail to improve, for Consider medial branch blocks.   Procedures: No procedures performed  Lumbar Epidural Steroid Injection - Interlaminar Approach with Fluoroscopic Guidance  Patient: Ronald [name]      Date of Birth: 08/24/1951 MRN: [id number] PCP: Ronald [name], [name], MD      Visit Date: 10/04/2017   Universal Protocol:     Consent Given By: the  patient  Position: PRONE  Additional Comments: Vital signs were monitored before and after the procedure. Patient was prepped and draped in the usual sterile fashion. The correct patient, procedure, and site was verified.   Injection Procedure Details:  Procedure Site One Meds Administered:  Meds ordered this encounter  Medications  . betamethasone acetate-betamethasone sodium phosphate (CELESTONE) injection 12 mg     Laterality: Left  Location/Site:  L3-L4  Needle size: 20 G  Needle type: Tuohy  Needle Placement: Paramedian epidural  Findings:   -Comments: Excellent flow of contrast into the epidural space.  Procedure Details: Using a paramedian approach from the side mentioned above, the region overlying the inferior lamina was localized under fluoroscopic visualization and the soft tissues overlying this structure were infiltrated with 4 ml. of 1% Lidocaine without Epinephrine. The Tuohy needle was inserted into the epidural space using a paramedian approach.  The epidural space was localized using loss of resistance along with lateral and bi-planar fluoroscopic views.  After negative aspirate for air, blood, and CSF, a 2 ml. volume of Isovue-250 was injected into the epidural space and the flow of contrast was observed. Radiographs were obtained for documentation purposes.    The injectate was administered into the level noted above.   Additional Comments:  The patient tolerated the procedure well.  Post procedure the patient did have some increased pain in the lower back and difficulty going from sit to stand was made a little bit worse.  We did let him rest for a little while this was something that recovered pretty quickly and he was able to leave.  I think this was probably a fluid issue with trying to put a fair amount of injectate fluid through a small stenotic area. Dressing: Band-Aid    Post-procedure details: Patient was observed during the  procedure. Post-procedure instructions were reviewed.  Patient left the clinic in stable condition.    Clinical History: 06/12/2017 Lumbar Spine MRI FINDINGS:  Bones:  Vertebral body heights are maintained. No acute fracture. No focal lesions. Spinal cord/conus: No abnormal signal or mass.  Conus terminates at normal level. Alignment: No significant subluxation.  Degenerative changes: T12-L1: Mild disc space height loss. Broad-based disc bulge. Bilateral facet degenerative changes. Moderate bilateral foraminal stenosis. Partial effacement of ventral thecal sac.  L1-L2: Mild facet degenerative changes. Mild to moderate left and mild right foraminal stenosis. No significant central canal stenosis.  L2-L3: Broad-based disc bulge. Moderate facet degenerative changes with trace facet joint effusions. Moderate to advanced central canal stenosis. Marked left with impingement of exiting left-sided nerve root and at least moderate right foraminal  stenosis. Wavy appearance nerve roots within the spinal canal above this level likely related to the degree significant central canal stenosis.    L3-L4: Broad-based disc bulge with central disc extrusion extending cephalad along the lower aspect of the L3 vertebral body. Mild to moderate central canal stenosis when coupled with moderate facet degenerative changes. Marked left and right foraminal  stenosis. Likely impingement of exiting nerve roots bilaterally.   L4-L5: Broad based disc bulge. Moderate bilateral facet degenerative changes. Disc space height loss. Mild central canal stenosis. Marked bilateral foraminal stenosis with disc material likely abutting the exiting L4 nerve roots. Moderate to advanced  endplate degenerative changes.  L5-S1: Marked bilateral facet degenerative changes. Small disc bulge. Mild effacement of the anterior aspect of the thecal sac. Moderate to advanced right and marked left foraminal stenosis.  Paraspinal soft tissues:  Unremarkable  IMPRESSION: Extensive diffuse degenerative changes throughout the lumbar spine.  Central canal stenosis greatest L2/L3 where it is moderate to advanced secondary to disc bulge and posterior hypertrophic changes. Mild-to-moderate central canal stenosis L3/L4 secondary to disc bulge and superimposed central disc extrusion.  Extensive foraminal stenosis L2/L3, L3/L4, L4/L5, and L5/S1 with likely abutment of the exiting nerve roots.  He reports that he has quit smoking. he has never used smokeless tobacco. No results for input(s): HGBA1C, LABURIC in the last 8760 hours.  Objective:  VS:  HT:    WT:   BMI:     BP:(!) 144/82  HR:76bpm  TEMP:97.7 F (36.5 C)(Oral)  RESP:98 % Physical Exam  Constitutional: He is oriented to person, place, and time. He appears well-developed and well-nourished. No distress.  HENT:  Head: Normocephalic and atraumatic.  Nose: Nose normal.  Mouth/Throat: Oropharynx is clear and moist.  Eyes: Conjunctivae are normal. Pupils  are equal, round, and reactive to light.  Neck: Normal range of motion. Neck supple.  Cardiovascular: Regular rhythm and intact distal pulses.  Pulmonary/Chest: Effort normal and breath sounds normal.  Abdominal: Soft. He exhibits no distension.  Musculoskeletal: He exhibits no deformity.  Patient is very slow to rise from a seated position and does stand with a forward flexed spine.  He has pain with extension of the lumbar spine.  He has no pain with hip rotation no pain over the greater trochanter.  He has good strength in both extremities without deficits.  He has no clonus.  Neurological: He is alert and oriented to person, place, and time. He exhibits abnormal muscle tone. Coordination normal.  On exam patient did have some increased tone with some cogwheeling but it was mild  Skin: Skin is warm. No rash noted.  Psychiatric: He has a normal mood and affect. His behavior is normal.  Nursing note and vitals reviewed.     Ortho Exam Imaging: Xr C-arm No Report  Result Date: 10/04/2017 Please see Notes or Procedures tab for imaging impression.   Past Medical/Family/Surgical/Social History: Medications & Allergies reviewed per EMR There are no active problems to display for this patient.  Past Medical History:  Diagnosis Date  . Diabetes mellitus without complication (HCC)   . Hypertension   . Parkinson's disease (HCC)    Family History  Problem Relation Age of Onset  . Diabetes Mother   . Stroke Father    Past Surgical History:  Procedure Laterality Date  . APPENDECTOMY    . back epidural   08/2017  . CORONARY ANGIOPLASTY WITH STENT PLACEMENT  08/19/2012  . KNEE SURGERY     Social History   Occupational History  . Occupation: Retired   Tobacco Use  . Smoking status: Former Games developer  . Smokeless tobacco: Never Used  Substance and Sexual Activity  . Alcohol use: Yes  . Drug use: No  . Sexual activity: Not on file

## 2017-10-05 NOTE — Procedures (Signed)
Lumbar Epidural Steroid Injection - Interlaminar Approach with Fluoroscopic Guidance  Patient: Ronald [name]      Date of Birth: 09/15/1951 MRN: [id number] PCP: Woodroe ChenJohns, [name], MD      Visit Date: 10/04/2017   Universal Protocol:     Consent Given By: the patient  Position: PRONE  Additional Comments: Vital signs were monitored before and after the procedure. Patient was prepped and draped in the usual sterile fashion. The correct patient, procedure, and site was verified.   Injection Procedure Details:  Procedure Site One Meds Administered:  Meds ordered this encounter  Medications  . betamethasone acetate-betamethasone sodium phosphate (CELESTONE) injection 12 mg     Laterality: Left  Location/Site:  L3-L4  Needle size: 20 G  Needle type: Tuohy  Needle Placement: Paramedian epidural  Findings:   -Comments: Excellent flow of contrast into the epidural space.  Procedure Details: Using a paramedian approach from the side mentioned above, the region overlying the inferior lamina was localized under fluoroscopic visualization and the soft tissues overlying this structure were infiltrated with 4 ml. of 1% Lidocaine without Epinephrine. The Tuohy needle was inserted into the epidural space using a paramedian approach.   The epidural space was localized using loss of resistance along with lateral and bi-planar fluoroscopic views.  After negative aspirate for air, blood, and CSF, a 2 ml. volume of Isovue-250 was injected into the epidural space and the flow of contrast was observed. Radiographs were obtained for documentation purposes.    The injectate was administered into the level noted above.   Additional Comments:  The patient tolerated the procedure well.  Post procedure the patient did have some increased pain in the lower back and difficulty going from sit to stand was made a little bit worse.  We did let him rest for a little while this was something that  recovered pretty quickly and he was able to leave.  I think this was probably a fluid issue with trying to put a fair amount of injectate fluid through a small stenotic area. Dressing: Band-Aid    Post-procedure details: Patient was observed during the procedure. Post-procedure instructions were reviewed.  Patient left the clinic in stable condition.

## 2017-10-12 ENCOUNTER — Telehealth (INDEPENDENT_AMBULATORY_CARE_PROVIDER_SITE_OTHER): Payer: Self-pay | Admitting: Physical Medicine and Rehabilitation

## 2017-10-12 NOTE — Telephone Encounter (Signed)
He unfortunately has significant stenosis in this area require surgical decompression.  However the other option would be to perform those diagnostic medial branch blocks or facet joint blocks that we talked about.  This would be for the arthritis in his back and not the stenosis.

## 2017-10-16 NOTE — Telephone Encounter (Signed)
Left message for patient to call back to schedule/ discuss. 

## 2017-10-17 ENCOUNTER — Telehealth (INDEPENDENT_AMBULATORY_CARE_PROVIDER_SITE_OTHER): Payer: Self-pay | Admitting: Physical Medicine and Rehabilitation

## 2017-10-30 NOTE — Telephone Encounter (Signed)
Closing encounter

## 2017-11-02 ENCOUNTER — Ambulatory Visit: Payer: Medicare HMO | Admitting: Neurology

## 2017-11-02 ENCOUNTER — Encounter: Payer: Self-pay | Admitting: Neurology

## 2017-11-02 ENCOUNTER — Encounter (INDEPENDENT_AMBULATORY_CARE_PROVIDER_SITE_OTHER): Payer: Self-pay

## 2017-11-02 VITALS — BP 126/70 | HR 68 | Ht 69.0 in | Wt 187.0 lb

## 2017-11-02 DIAGNOSIS — M545 Low back pain, unspecified: Secondary | ICD-10-CM

## 2017-11-02 DIAGNOSIS — D6489 Other specified anemias: Secondary | ICD-10-CM | POA: Diagnosis not present

## 2017-11-02 DIAGNOSIS — G8929 Other chronic pain: Secondary | ICD-10-CM

## 2017-11-02 DIAGNOSIS — G20 Parkinson's disease: Secondary | ICD-10-CM

## 2017-11-02 DIAGNOSIS — G20A1 Parkinson's disease without dyskinesia, without mention of fluctuations: Secondary | ICD-10-CM

## 2017-11-02 NOTE — Progress Notes (Signed)
Subjective:    Patient ID: Ronald [name] is a 66 y.o. male.  HPI     Interim history:   Ronald [name] is a 66 yo Left-handed gentleman with an underlying medical history of diabetes, Bell's palsy some 10 years ago, overweight state, CAD with MI in 2013 and 3 stents placed at the time, and hypertension who presents for follow-up consultation of his Parkinson's disease. The patient is accompanied by 2 younger GC today. I last saw him on 05/02/2017, at which time he was complaining of lower back pain. He had seen orthopedics for this. He also had right knee pain, status post injection. He had not fallen thankfully. He was taking Sinemet 2 pills 3 times a day.  Today, 11/02/2017: He reports that he is having more back pain issues. He has had injection in the lower back under Dr. Shute in 12/18, but unfortunately, had no relief. Traveled to [place] for about 10 days. Had another injection under Dr. Newton in January, which helped and is due to go back for another injection later this month. Unfortunately, because of the back pain his mobility has been left. He has not played golf in a few months. He had to take a hiatus from going to the bicycling class he was going to. Of note, he reports that he is taking iron supplements. His wife wanted him to start this. He then goes on to report that he was told his hemoglobin was low. I checked outside records and he has had indeed a slow reduction in his hemoglobin and red blood cell count. He has seen hematology for this. Of note, he takes Mobic every day. I'm a little worried about him taking Mobic and having possibly a slow degree of blood loss over time. He is advised to consider seeing a GI specialist and discuss this with his primary care physician. He has had blood work through hematology. He is per se not iron deficient.   The patient's allergies, current medications, family history, past medical history, past social history, past surgical history and problem  list were reviewed and updated as appropriate.    Previously (copied from previous notes for reference):    I saw him on 10/31/2016, at which time he reported doing okay, no recent new symptoms, in particular no significant memory loss or mood disorder constipation. He did have an episode of chest pain recently. He saw his cardiologist. He felt to be a little dehydrated and blood pressure was on the lower end of normal. He was noted to have some orthostatic hypotension. He was started on northera 100 mg tid, but had not filled it yet. He was trying to stay active and going to the [employer] 3 times a week. He was also interested in pursuing boxing classes for PD. He had lost a little bit of weight and reported some loss of appetite. I suggested he continue with Sinemet 2 pills 3 times a day. Advised to change positions slowly and be mindful of his blood pressure dropping potentially with sudden changes of position and he was reminded to stay well-hydrated.     I first met him on 04/27/2016 at the request of his primary care physician, at which time he reported a prior diagnosis of Parkinson's disease in 2014 and symptoms dating back to early 2014. He was exercising regularly, he was doing well on symptomatic treatment with Sinemet and I suggested he continue with the medication, at 2 pills 3 times a day. He was reminded to   stay active physically and mentally and stable hydrated and well-nourished.      04/27/2016: He was diagnosed with Parkinson's disease in 2014. Symptoms date back to beginning of 2014, and started with speech changes, was noted to dragging his feet.  He has been on Sinemet for the past 3 years, with gradual increase, currently 2 pills in the morning, 2 in the afternoon and 1 at night, was asked to reduce it, He is not sure why, seems to tolerate it well and believes it has helped. He is wondering however if there is any new medication available. He has not been on a dopamine agonist before  from what I understand, no Azilect, no other MAO B inhibitor either.  He exercises regularly and is enrolled in the PD spinning class at 2 different [employer]. Lives with his wife of 37 years in [place], and they have 3 grown daughters, 2 GC. He is a nonsmoker, drinks alcohol infrequently, is a retired photographer.  I reviewed your office note from 02/08/2016. He used to see Dr. Ford, neurologist out of [facility] by Dr. Ford retired. He then saw another neurologist in the same office but she left. Prior office records from his previous neurologists are not available for my review today. We will request office records from Dr. Ford's office, patient signed a ROI form today and is agreeable.   He denies any major memory or mood issues or sleep issues, does snore some, but no apneas reported, no hx of RBD. No FHx of PD.  He varies his C/L dose, 1st dose 6-9 AM, second dose around 12 or 1 PM and last dose around 8 PM.    He also plays golf, once or twice a week. He tries to stay well-hydrated, may not always drink enough water however.  His Past Medical History Is Significant For: Past Medical History:  Diagnosis Date  . Diabetes mellitus without complication (HCC)   . Hypertension   . Parkinson's disease (HCC)     His Past Surgical History Is Significant For: Past Surgical History:  Procedure Laterality Date  . APPENDECTOMY    . back epidural   08/2017  . CORONARY ANGIOPLASTY WITH STENT PLACEMENT  08/19/2012  . KNEE SURGERY      His Family History Is Significant For: Family History  Problem Relation Age of Onset  . Diabetes Mother   . Stroke Father     His Social History Is Significant For: Social History   Socioeconomic History  . Marital status: Married    Spouse name: Jakie   . Number of children: 3  . Years of education: None  . Highest education level: None  Social Needs  . Financial resource strain: None  . Food insecurity - worry: None  . Food insecurity - inability:  None  . Transportation needs - medical: None  . Transportation needs - non-medical: None  Occupational History  . Occupation: Retired   Tobacco Use  . Smoking status: Former Smoker  . Smokeless tobacco: Never Used  Substance and Sexual Activity  . Alcohol use: Yes  . Drug use: No  . Sexual activity: None  Other Topics Concern  . None  Social History Narrative   Drinks about 1 cup of coffee a day     His Allergies Are:  No Known Allergies:   His Current Medications Are:  Outpatient Encounter Medications as of 11/02/2017  Medication Sig  . aspirin 81 MG chewable tablet Chew by mouth.  . baclofen (LIORESAL) 10   MG tablet 2 (two) times daily.   . carbidopa-levodopa (SINEMET IR) 25-100 MG tablet Take 2 tablets by mouth 3 (three) times daily.  Marland Kitchen lisinopril (PRINIVIL,ZESTRIL) 5 MG tablet Take 1 tablet daily.  . meloxicam (MOBIC) 7.5 MG tablet   . metFORMIN (GLUCOPHAGE) 500 MG tablet TAKE ONE TABLET BY MOUTH ONCE DAILY WITH BREAKFAST  . metoprolol succinate (TOPROL-XL) 25 MG 24 hr tablet Take by mouth.  . oxybutynin (DITROPAN) 5 MG tablet TAKE ONE TABLET BY MOUTH TWICE DAILY NEEDS  APPT  . pioglitazone (ACTOS) 15 MG tablet daily.  . sertraline (ZOLOFT) 50 MG tablet Take 50 mg by mouth at bedtime.  . simvastatin (ZOCOR) 40 MG tablet TAKE ONE TABLET BY MOUTH IN THE EVENING  . traMADol (ULTRAM) 50 MG tablet as needed.   No facility-administered encounter medications on file as of 11/02/2017.   :  Review of Systems:  Out of a complete 14 point review of systems, all are reviewed and negative with the exception of these symptoms as listed below: Review of Systems  Neurological:       Pt presents today to discuss his PD. Pt says since he was last here, he has had several back issues, including spinal stenosis anda pinched nerve.    Objective:  Neurological Exam  Physical Exam Physical Examination:   Vitals:   11/02/17 0937  BP: 126/70  Pulse: 68    [name] Examination: The  patient is a very pleasant 67 y.o. male in no acute distress. He appears well-developed and well-nourished and well groomed. Quieter and almost a little frail looking today.  HEENT:Normocephalic, atraumatic, pupils are equal, round and reactive to light and accommodation. Extraocular tracking is slightly impaired. His face is slightly asymmetric with stable left facial weakness noted, less twitching today. He has mild to moderate facial masking, has become a little worse. Neck is mildly rigid, stable. He has mild to moderate hypophonia which is a little worse. No dysarthria. Airway examination revealed mild to moderate mouth dryness, adequate dental hygiene, no other changes.  Chest:Clear to auscultation without wheezing, rhonchi or crackles noted.  Heart:S1+S2+0, regular and normal without murmurs, rubs or gallops noted.   Abdomen:Soft, non-tender and non-distended with normal bowel sounds appreciated on auscultation.  Extremities:There is Trace edema in both lower extremities around the ankles mainly, right more so than leftnopitting edema in the distal lower extremities bilaterally. Pedal pulses are intact.  Skin: Warm and dry without trophic changes noted.  Musculoskeletal: exam reveals no obvious joint deformities, tenderness or joint swelling or erythema, with the exception of slightly larger appearing right knee but it is not tender and midline low back pain, paraspinal muscle areas particularly.   Neurologically:  Mental status: The patient is awake, alert and oriented in all 4 spheres. Hisimmediate and remote memory, attention, language skills and fund of knowledge are appropriate. There is no evidence of aphasia, agnosia, apraxia or anomia. Speech is clear with normal prosody and enunciation. Thought process is linear. Mood is normaland affect is normal.  Cranial nerves II - XII are as described above under HEENT exam. In addition: shoulder shrug is normal with equal  shoulder height noted. Motor exam: Normal bulk, strength for age, no drift or rebound, no resting tremor today. He has mild to moderate difficulty with fine motor skills on the right including finger taps, hand movements and rapid alternating patting, foot taps and foot agility as well. On the left, he has milder findings. Reflexes are 1+. Sensory exam intact to  light touch throughout.mild to moderate bradykinesia, no dyskinesias. He stands without significant difficulty and denies any lightheadedness. He has a decreased arm swing when walking, posture is age-appropriate or mildly stooped. He has mild trouble turning, tandem walk is not testable safely.  Assessment and Plan:   In summary, Ronald Garciais a very pleasant 66-year old left-handed gentleman with an underlying medical history of coronary artery disease, status post stent placement, history of left-sided Bell's palsy, overweight state, degenerative back d/s, with s/p ESI about a year ago, s/p R knee injection, hypertension, type 2 diabetes, who presents for follow-up consultation of his right-sided predominant Parkinson's disease,with overall fairly stable findings, increase in facial masking and slowness noted. He would like to avoid increasing his Sinemet at this time. We mutually agreed to keep it at 2 pills 3 times a day. Nevertheless, he has had more back pain and has started getting more regular injections, he is due for facet injection later this month under Dr. Newton. In addition, he has been diagnosed with anemia. I'm a little worried about him taking Mobic on a daily basis, his latest hemoglobin is still a little lower than 3 months prior. Latest hemoglobin in January 2019 was 10.3. He has seen hematology. He is encouraged to talk to his primary care physician about potentially seeing a GI specialist. From my end of things I suggested a six-month follow-up but I asked him to keep us posted as to his back pain and results from the  injection as well as his anemia. I answered all his questions today and he was in agreement. He did not need a refill on the Sinemet as he has a 90 day prescription with refills. I spent 25 minutes in total face-to-face time with the patient, more than 50% of which was spent in counseling and coordination of care, reviewing test results, reviewing medication and discussing or reviewing the diagnosis of PD, its prognosis and treatment options. Pertinent laboratory and imaging test results that were available during this visit with the patient were reviewed by me and considered in my medical decision making (see chart for details).    

## 2017-11-02 NOTE — Patient Instructions (Signed)
I am sorry your are still struggling with your back pain issues, hopefully this month's back injection will help some more. Please try to continue to stay active as best as you can. We will keep your Sinemet the same for now, we can consider increasing it down the Road. For now, I would like for you to focus on your back issues and also your anemia. Your latest hemoglobin was 10.3 in January 2019 which is still a little lower than the number in October.I am a little worried that your meloxicam/Mobic may be causing you stomach trouble and you may be losing a little bit of blood. I would like for you to discuss with your primary care physician a referral to GI (stomach/intestine specialist). You have seen a hematologist recently.

## 2017-11-07 ENCOUNTER — Ambulatory Visit (INDEPENDENT_AMBULATORY_CARE_PROVIDER_SITE_OTHER): Payer: Medicare HMO | Admitting: Physical Medicine and Rehabilitation

## 2017-11-07 ENCOUNTER — Ambulatory Visit (INDEPENDENT_AMBULATORY_CARE_PROVIDER_SITE_OTHER): Payer: Medicare HMO

## 2017-11-07 ENCOUNTER — Encounter (INDEPENDENT_AMBULATORY_CARE_PROVIDER_SITE_OTHER): Payer: Self-pay | Admitting: Physical Medicine and Rehabilitation

## 2017-11-07 VITALS — BP 160/88 | HR 61 | Temp 98.0°F

## 2017-11-07 DIAGNOSIS — M47816 Spondylosis without myelopathy or radiculopathy, lumbar region: Secondary | ICD-10-CM

## 2017-11-07 DIAGNOSIS — M5442 Lumbago with sciatica, left side: Secondary | ICD-10-CM

## 2017-11-07 DIAGNOSIS — M48062 Spinal stenosis, lumbar region with neurogenic claudication: Secondary | ICD-10-CM

## 2017-11-07 DIAGNOSIS — M5441 Lumbago with sciatica, right side: Secondary | ICD-10-CM

## 2017-11-07 DIAGNOSIS — G8929 Other chronic pain: Secondary | ICD-10-CM

## 2017-11-07 DIAGNOSIS — M5416 Radiculopathy, lumbar region: Secondary | ICD-10-CM

## 2017-11-07 MED ORDER — BUPIVACAINE HCL 0.5 % IJ SOLN: 3.0000 mL | Freq: Once | INTRAMUSCULAR | Status: DC

## 2017-11-07 NOTE — Progress Notes (Deleted)
Pt states a constant sharp pain in lower back that radiates through both right and left leg with some numbness and tingling. Pt states pain began summer 2018. Pt states getting up in general makes the pain worse especially in the morning. Pt states a heating pad makes pain better. +Driver, -BT,-Dye Allergies.

## 2017-11-07 NOTE — Patient Instructions (Signed)

## 2017-11-08 ENCOUNTER — Telehealth (INDEPENDENT_AMBULATORY_CARE_PROVIDER_SITE_OTHER): Payer: Self-pay | Admitting: Physical Medicine and Rehabilitation

## 2017-11-08 NOTE — Procedures (Signed)
Lumbar Diagnostic Facet Joint Nerve Block with Fluoroscopic Guidance   Patient: Ronald [name]      Date of Birth: 11/08/1950 MRN: [id number] PCP: Woodroe ChenJohns, [name], MD      Visit Date: 11/07/2017   Universal Protocol:    Date/Time: 02/20/195:28 AM  Consent Given By: the patient  Position: PRONE  Additional Comments: Vital signs were monitored before and after the procedure. Patient was prepped and draped in the usual sterile fashion. The correct patient, procedure, and site was verified.   Injection Procedure Details:  Procedure Site One Meds Administered:  Meds ordered this encounter  Medications  . bupivacaine (MARCAINE) 0.5 % (with pres) injection 3 mL     Laterality: Left  Location/Site:  L2-L3 L3-L4 L4-L5  Needle size: 22 ga.  Needle type:spinal  Needle Placement: Oblique pedical  Findings:   -Comments: There was excellent flow of contrast along the articular pillars without intravascular flow.  Procedure Details: The fluoroscope beam is vertically oriented in AP and then obliqued 15 to 20 degrees to the ipsilateral side of the desired nerve to achieve the "Scotty dog" appearance.  The skin over the target area of the junction of the superior articulating process and the transverse process (sacral ala if blocking the L5 dorsal rami) was locally anesthetized with a 1 ml volume of 1% Lidocaine without Epinephrine.  The spinal needle was inserted and advanced in a trajectory view down to the target.   After contact with periosteum and negative aspirate for blood and CSF, correct placement without intravascular or epidural spread was confirmed by injecting 0.5 ml. of Isovue-250.  A spot radiograph was obtained of this image.    Next, a 0.5 ml. volume of the injectate described above was injected. The needle was then redirected to the other facet joint nerves mentioned above if needed.  Prior to the procedure, the patient was given a Pain Diary which was completed  for baseline measurements.  After the procedure, the patient rated their pain every 30 minutes and will continue rating at this frequency for a total of 5 hours.  The patient has been asked to complete the Diary and return to us by mail, fax or hand delivered as soon as possible.   Additional Comments:  The patient tolerated the procedure well Dressing: Band-Aid    Post-procedure details: Patient was observed during the procedure. Post-procedure instructions were reviewed.  Patient left the clinic in stable condition.

## 2017-11-08 NOTE — Progress Notes (Signed)
Ronald [name] - 67 y.o. male MRN [id number]  Date of birth: 02-02-1951  Office Visit Note: Visit Date: 11/07/2017 PCP: Woodroe Chen, MD Referred by: Woodroe Chen, MD  Subjective: Chief Complaint  Patient presents with  . Lower Back - Pain  . Right Leg - Pain  . Left Leg - Pain   HPI: Mr. Ronald [name] is a pleasant 67 year old gentleman who comes in today with his wife.  I do want to take the first bit of this note to detail his somewhat complicated history and it is somewhat made complicated by the patient's history and at least recently seeing multiple physicians for the same problem.  Nonetheless, I had seen the patient's wife in the past and so he has been seeing Dr. Cleophas Dunker for more of his orthopedic care.  He was referred to Dr. Petra Kuba for neurosurgical consultation.  There is a very detailed and thorough note from her and October of last year.  She commented about the severe stenosis at L2-3 and the multilevel facet arthropathy.  She wanted him to undergo aggressive physical therapy and try some management with injections to see if he had any relief.  She did not really specify particular injections at that point but did refer him to their pain clinic.  He did see a person in the pain clinic who recommended medial branch blocks of L3-4, L4-5 and L5-S1.  He went on to see Dr. Ignacia Marvel.  At that time he was having a planned trip to [place] I believe he did not want to undergo diagnostic timing trying to perform a radiofrequency ablation would take a while.  He ended up having a bilateral transforaminal injection I believe at L3.  He reported that this did not help very much.  The notes say that it helped some.  Had actually set him up for a repeat injection.  He was also displeased with the cost associated with having the injections done there at the surgery center.  At that point he saw Dr. Cleophas Dunker who recommended repeat epidural injection as well.  Because the patient had seen  me in the past for an intra-articular hip injection and I had seen his wife he was kindly what we pretty much went over the entire history with the patient again on that visit and decided to complete an intralaminar epidural steroid injection below the level of stenosis.  Interestingly he tells me he got 1 day of almost the symptoms came back rather abruptly.  His symptoms are such that they are axial low back pain worse in the morning and has to lean forward to get any relief.  If he stands for any length of time he gets pain in the left hip and leg somewhat L5 distribution and L4 distribution.  His main complaint is his left hip and leg and not necessarily his back.  He does have some back pain and his wife says he complains of his back.  He tries to stay active and loves to play golf.  He has been playing golf twice a week up until this pain level hit him.  Medications have been only somewhat beneficial.  He has done physical therapy.  He denies any focal weakness or other red flag complaints.  He has not been back to see Dr. Petra Kuba.  I spent probably notes detailing clinical scenarios of his pathology of his spine.    Review of Systems  Musculoskeletal: Positive for back pain and joint pain.  Left hip and leg pain   Otherwise per HPI.  Assessment & Plan: Visit Diagnoses:  1. Spondylosis without myelopathy or radiculopathy, lumbar region   2. Chronic bilateral low back pain with bilateral sciatica   3. Spinal stenosis of lumbar region with neurogenic claudication   4. Lumbar radiculopathy     Plan: Findings:  Chronic worsening left hip and leg pain predominantly with along with axial low back pain which is just worsened over the last year.  He has been a fairly active individual with playing golf and is also complicated by history of Parkinson's disease.  He has MRI findings of very severe advanced central canal stenosis which is multifactorial L2-3.  I do feel like this is the main  source of his pain.  He does have facet arthropathy and some back pain but I just do not feel that is the main source.  However after a long discussion with him today we decided to complete medial branch blocks of the left lower facets just to see if he gets relief of the left hip pain.  If he does we could look at radiofrequency ablation.  I tried to explain the rationale for how that gets approved in the process there.  The patient obviously wants things to move faster because of his pain.  I also want him to regroup and see Dr. Petra Kuba again as I do feel like this is going to lead to a surgical situation for him.  He is 67 years old though he has Parkinson's he tries to remain pretty active.  He has severe stenosis at that L2-3 level.  He did get almost 100% relief with epidural injection is very short-lived.    Meds & Orders:  Meds ordered this encounter  Medications  . bupivacaine (MARCAINE) 0.5 % (with pres) injection 3 mL    Orders Placed This Encounter  Procedures  . Facet Injection  . XR C-ARM NO REPORT    Follow-up: Return if symptoms worsen or fail to improve.  I also want him to follow-up with Dr. Petra Kuba.  Procedures: No procedures performed  Lumbar Diagnostic Facet Joint Nerve Block with Fluoroscopic Guidance   Patient: Ronald [name]      Date of Birth: 09-Dec-1950 MRN: [id number] PCP: Woodroe Chen, MD      Visit Date: 11/07/2017   Universal Protocol:    Date/Time: 02/20/195:28 AM  Consent Given By: the patient  Position: PRONE  Additional Comments: Vital signs were monitored before and after the procedure. Patient was prepped and draped in the usual sterile fashion. The correct patient, procedure, and site was verified.   Injection Procedure Details:  Procedure Site One Meds Administered:  Meds ordered this encounter  Medications  . bupivacaine (MARCAINE) 0.5 % (with pres) injection 3 mL     Laterality: Left  Location/Site:   L2-L3 L3-L4 L4-L5  Needle size: 22 ga.  Needle type:spinal  Needle Placement: Oblique pedical  Findings:   -Comments: There was excellent flow of contrast along the articular pillars without intravascular flow.  Procedure Details: The fluoroscope beam is vertically oriented in AP and then obliqued 15 to 20 degrees to the ipsilateral side of the desired nerve to achieve the "Scotty dog" appearance.  The skin over the target area of the junction of the superior articulating process and the transverse process (sacral ala if blocking the L5 dorsal rami) was locally anesthetized with a 1 ml volume of 1% Lidocaine without Epinephrine.  The spinal needle was inserted and  advanced in a trajectory view down to the target.   After contact with periosteum and negative aspirate for blood and CSF, correct placement without intravascular or epidural spread was confirmed by injecting 0.5 ml. of Isovue-250.  A spot radiograph was obtained of this image.    Next, a 0.5 ml. volume of the injectate described above was injected. The needle was then redirected to the other facet joint nerves mentioned above if needed.  Prior to the procedure, the patient was given a Pain Diary which was completed for baseline measurements.  After the procedure, the patient rated their pain every 30 minutes and will continue rating at this frequency for a total of 5 hours.  The patient has been asked to complete the Diary and return to [place] by mail, fax or hand delivered as soon as possible.   Additional Comments:  The patient tolerated the procedure well Dressing: Band-Aid    Post-procedure details: Patient was observed during the procedure. Post-procedure instructions were reviewed.  Patient left the clinic in stable condition.   Clinical History: 06/12/2017 Lumbar Spine MRI FINDINGS:  Bones:  Vertebral body heights are maintained. No acute fracture. No focal lesions. Spinal cord/conus: No abnormal signal or mass.   Conus terminates at normal level. Alignment: No significant subluxation.  Degenerative changes: T12-L1: Mild disc space height loss. Broad-based disc bulge. Bilateral facet degenerative changes. Moderate bilateral foraminal stenosis. Partial effacement of ventral thecal sac.  L1-L2: Mild facet degenerative changes. Mild to moderate left and mild right foraminal stenosis. No significant central canal stenosis.  L2-L3: Broad-based disc bulge. Moderate facet degenerative changes with trace facet joint effusions. Moderate to advanced central canal stenosis. Marked left with impingement of exiting left-sided nerve root and at least moderate right foraminal  stenosis. Wavy appearance nerve roots within the spinal canal above this level likely related to the degree significant central canal stenosis.    L3-L4: Broad-based disc bulge with central disc extrusion extending cephalad along the lower aspect of the L3 vertebral body. Mild to moderate central canal stenosis when coupled with moderate facet degenerative changes. Marked left and right foraminal  stenosis. Likely impingement of exiting nerve roots bilaterally.   L4-L5: Broad based disc bulge. Moderate bilateral facet degenerative changes. Disc space height loss. Mild central canal stenosis. Marked bilateral foraminal stenosis with disc material likely abutting the exiting L4 nerve roots. Moderate to advanced  endplate degenerative changes.  L5-S1: Marked bilateral facet degenerative changes. Small disc bulge. Mild effacement of the anterior aspect of the thecal sac. Moderate to advanced right and marked left foraminal stenosis.  Paraspinal soft tissues: Unremarkable  IMPRESSION: Extensive diffuse degenerative changes throughout the lumbar spine.  Central canal stenosis greatest L2/L3 where it is moderate to advanced secondary to disc bulge and posterior hypertrophic changes. Mild-to-moderate central canal stenosis L3/L4 secondary to disc bulge  and superimposed central disc extrusion.  Extensive foraminal stenosis L2/L3, L3/L4, L4/L5, and L5/S1 with likely abutment of the exiting nerve roots.  He reports that he has quit smoking. he has never used smokeless tobacco. No results for input(s): HGBA1C, LABURIC in the last 8760 hours.  Objective:  VS:  HT:    WT:   BMI:     BP:(!) 160/88  HR:61bpm  TEMP:98 F (36.7 C)(Oral)  RESP:100 % Physical Exam  Constitutional: He is oriented to person, place, and time. He appears well-developed and well-nourished. No distress.  HENT:  Head: Normocephalic and atraumatic.  Eyes: Conjunctivae are normal. Pupils are equal, round, and reactive  to light.  Neck: Normal range of motion. Neck supple.  Cardiovascular: Regular rhythm and intact distal pulses.  Pulmonary/Chest: Effort normal. No respiratory distress.  Musculoskeletal:  Patient is slow to rise from a seated position and does walk and stand with a forward flexed lumbar spine.  He is very stiff with extension and does have some pain with extension of the lumbar spine.  He has no pain over the greater trochanters.  He has no pain of the hips with internal or external rotation.  He has good distal strength.  Again he has some increased tone.  Neurological: He is alert and oriented to person, place, and time. He exhibits abnormal muscle tone. Coordination normal.  Patient is very stiff to the lumbar spine and legs.  He has mild cogwheeling.  Skin: Skin is warm and dry. No rash noted. No erythema.  Psychiatric: He has a normal mood and affect.  Nursing note and vitals reviewed.   Ortho Exam Imaging: Xr C-arm No Report  Result Date: 11/07/2017 Please see Notes or Procedures tab for imaging impression.   Past Medical/Family/Surgical/Social History: Medications & Allergies reviewed per EMR There are no active problems to display for this patient.  Past Medical History:  Diagnosis Date  . Diabetes mellitus without complication (HCC)    . Hypertension   . Parkinson's disease (HCC)    Family History  Problem Relation Age of Onset  . Diabetes Mother   . Stroke Father    Past Surgical History:  Procedure Laterality Date  . APPENDECTOMY    . back epidural   08/2017  . CORONARY ANGIOPLASTY WITH STENT PLACEMENT  08/19/2012  . KNEE SURGERY     Social History   Occupational History  . Occupation: Retired   Tobacco Use  . Smoking status: Former Games developermoker  . Smokeless tobacco: Never Used  Substance and Sexual Activity  . Alcohol use: Yes  . Drug use: No  . Sexual activity: Not on file

## 2017-11-09 NOTE — Telephone Encounter (Signed)
Will need auth for repeat medial branch blocks when we receive pain diary.

## 2017-11-09 NOTE — Telephone Encounter (Signed)
Do one more then RFA

## 2017-11-28 ENCOUNTER — Encounter (INDEPENDENT_AMBULATORY_CARE_PROVIDER_SITE_OTHER): Payer: Self-pay | Admitting: Physical Medicine and Rehabilitation

## 2017-11-28 ENCOUNTER — Ambulatory Visit (INDEPENDENT_AMBULATORY_CARE_PROVIDER_SITE_OTHER): Payer: Medicare HMO

## 2017-11-28 ENCOUNTER — Ambulatory Visit (INDEPENDENT_AMBULATORY_CARE_PROVIDER_SITE_OTHER): Payer: Medicare HMO | Admitting: Physical Medicine and Rehabilitation

## 2017-11-28 VITALS — BP 155/88 | HR 64 | Temp 98.1°F

## 2017-11-28 DIAGNOSIS — M47816 Spondylosis without myelopathy or radiculopathy, lumbar region: Secondary | ICD-10-CM

## 2017-11-28 DIAGNOSIS — G8929 Other chronic pain: Secondary | ICD-10-CM

## 2017-11-28 DIAGNOSIS — M5442 Lumbago with sciatica, left side: Secondary | ICD-10-CM

## 2017-11-28 DIAGNOSIS — M5441 Lumbago with sciatica, right side: Secondary | ICD-10-CM

## 2017-11-28 MED ORDER — BUPIVACAINE HCL 0.5 % IJ SOLN
3.0000 mL | Freq: Once | INTRAMUSCULAR | Status: AC
  Administered 2017-11-28: 3 mL

## 2017-11-28 NOTE — Patient Instructions (Signed)

## 2017-11-28 NOTE — Progress Notes (Signed)
Numeric Pain Rating Scale and Functional Assessment Average Pain 10   In the last MONTH (on 0-10 scale) has pain interfered with the following?  1. General activity like being  able to carry out your everyday physical activities such as walking, climbing stairs, carrying groceries, or moving a chair?  Rating(10)   +Driver, -BT, -Dye Allergies. 

## 2017-12-14 NOTE — Progress Notes (Signed)
Ronald [name] - 67 y.o. male MRN [id number]  Date of birth: May 21, 1951  Office Visit Note: Visit Date: 11/28/2017 PCP: Woodroe Chen, MD Referred by: Woodroe Chen, MD  Subjective: Chief Complaint  Patient presents with  . Lower Back - Pain  . Right Leg - Pain  . Left Leg - Pain   HPI: Ronald [name] is a 67 year old gentleman very pleasant fellow who returns with his wife who provides some of the history as well.  His case is somewhat complicated and can be reviewed with my other notes.  Nonetheless she comes in today after a first set of medial branch blocks with pain diary which shows he got really great relief during the length of time of the anesthetic.  His biggest complaint is his lower back worse with going from sit to stand and standing with extension.  He does get some hip and thigh pain which appears to be essentially unrelated.  He does have some level of stenosis higher up in the lumbar spine.  Epidural injections have been performed on 2 occasions one by myself in 1 by Dr. Courtney Paris without much relief.  He has gotten good relief with the medial branch blocks.  We had a long discussion today again with the patient regarding the steps that need to be taken in order to try to get approval for radiofrequency ablation.  We are going to complete a second set of diagnostic medial branch blocks today.  He meets all other criteria with physical therapy and medication management and time and chronic pain.   ROS Otherwise per HPI.  Assessment & Plan: Visit Diagnoses:  1. Spondylosis without myelopathy or radiculopathy, lumbar region   2. Chronic bilateral low back pain with bilateral sciatica     Plan: No additional findings.   Meds & Orders:  Meds ordered this encounter  Medications  . bupivacaine (MARCAINE) 0.5 % (with pres) injection 3 mL    Orders Placed This Encounter  Procedures  . Facet Injection  . XR C-ARM NO REPORT    Follow-up: Return if symptoms worsen or fail  to improve, for Pain Diary.   Procedures: No procedures performed  Lumbar Diagnostic Facet Joint Nerve Block with Fluoroscopic Guidance   Patient: Ronald [name]      Date of Birth: 1950/10/05 MRN: [id number] PCP: Woodroe Chen, MD      Visit Date: 11/28/2017   Universal Protocol:    Date/Time: 03/28/195:47 AM  Consent Given By: the patient  Position: PRONE  Additional Comments: Vital signs were monitored before and after the procedure. Patient was prepped and draped in the usual sterile fashion. The correct patient, procedure, and site was verified.   Injection Procedure Details:  Procedure Site One Meds Administered:  Meds ordered this encounter  Medications  . bupivacaine (MARCAINE) 0.5 % (with pres) injection 3 mL     Laterality: Bilateral  Location/Site:  L3-L4 L4-L5 L5-S1  Needle size: 22 ga.  Needle type:spinal  Needle Placement: Oblique pedical  Findings:   -Comments: There was excellent flow of contrast along the articular pillars without intravascular flow.  Procedure Details: The fluoroscope beam is vertically oriented in AP and then obliqued 15 to 20 degrees to the ipsilateral side of the desired nerve to achieve the "Scotty dog" appearance.  The skin over the target area of the junction of the superior articulating process and the transverse process (sacral ala if blocking the L5 dorsal rami) was locally anesthetized with a 1 ml volume of 1% Lidocaine  without Epinephrine.  The spinal needle was inserted and advanced in a trajectory view down to the target.   After contact with periosteum and negative aspirate for blood and CSF, correct placement without intravascular or epidural spread was confirmed by injecting 0.5 ml. of Isovue-250.  A spot radiograph was obtained of this image.    Next, a 0.5 ml. volume of the injectate described above was injected. The needle was then redirected to the other facet joint nerves mentioned above if  needed.  Prior to the procedure, the patient was given a Pain Diary which was completed for baseline measurements.  After the procedure, the patient rated their pain every 30 minutes and will continue rating at this frequency for a total of 5 hours.  The patient has been asked to complete the Diary and return to [place] by mail, fax or hand delivered as soon as possible.   Additional Comments:  The patient tolerated the procedure well Dressing: Band-Aid    Post-procedure details: Patient was observed during the procedure. Post-procedure instructions were reviewed.  Patient left the clinic in stable condition.   Clinical History: 06/12/2017 Lumbar Spine MRI FINDINGS:  Bones:  Vertebral body heights are maintained. No acute fracture. No focal lesions. Spinal cord/conus: No abnormal signal or mass.  Conus terminates at normal level. Alignment: No significant subluxation.  Degenerative changes: T12-L1: Mild disc space height loss. Broad-based disc bulge. Bilateral facet degenerative changes. Moderate bilateral foraminal stenosis. Partial effacement of ventral thecal sac.  L1-L2: Mild facet degenerative changes. Mild to moderate left and mild right foraminal stenosis. No significant central canal stenosis.  L2-L3: Broad-based disc bulge. Moderate facet degenerative changes with trace facet joint effusions. Moderate to advanced central canal stenosis. Marked left with impingement of exiting left-sided nerve root and at least moderate right foraminal  stenosis. Wavy appearance nerve roots within the spinal canal above this level likely related to the degree significant central canal stenosis.    L3-L4: Broad-based disc bulge with central disc extrusion extending cephalad along the lower aspect of the L3 vertebral body. Mild to moderate central canal stenosis when coupled with moderate facet degenerative changes. Marked left and right foraminal  stenosis. Likely impingement of exiting nerve roots  bilaterally.   L4-L5: Broad based disc bulge. Moderate bilateral facet degenerative changes. Disc space height loss. Mild central canal stenosis. Marked bilateral foraminal stenosis with disc material likely abutting the exiting L4 nerve roots. Moderate to advanced  endplate degenerative changes.  L5-S1: Marked bilateral facet degenerative changes. Small disc bulge. Mild effacement of the anterior aspect of the thecal sac. Moderate to advanced right and marked left foraminal stenosis.  Paraspinal soft tissues: Unremarkable  IMPRESSION: Extensive diffuse degenerative changes throughout the lumbar spine.  Central canal stenosis greatest L2/L3 where it is moderate to advanced secondary to disc bulge and posterior hypertrophic changes. Mild-to-moderate central canal stenosis L3/L4 secondary to disc bulge and superimposed central disc extrusion.  Extensive foraminal stenosis L2/L3, L3/L4, L4/L5, and L5/S1 with likely abutment of the exiting nerve roots.   He reports that he has quit smoking. He has never used smokeless tobacco. No results for input(s): HGBA1C, LABURIC in the last 8760 hours.  Objective:  VS:  HT:    WT:   BMI:     BP:(!) 155/88  HR:64bpm  TEMP:98.1 F (36.7 C)(Oral)  RESP:100 % Physical Exam  Ortho Exam Imaging: No results found.  Past Medical/Family/Surgical/Social History: Medications & Allergies reviewed per EMR, new medications updated. There are no active problems  to display for this patient.  Past Medical History:  Diagnosis Date  . Diabetes mellitus without complication (HCC)   . Hypertension   . Parkinson's disease (HCC)    Family History  Problem Relation Age of Onset  . Diabetes Mother   . Stroke Father    Past Surgical History:  Procedure Laterality Date  . APPENDECTOMY    . back epidural   08/2017  . CORONARY ANGIOPLASTY WITH STENT PLACEMENT  08/19/2012  . KNEE SURGERY     Social History   Occupational History  . Occupation: Retired     Tobacco Use  . Smoking status: Former Games developermoker  . Smokeless tobacco: Never Used  Substance and Sexual Activity  . Alcohol use: Yes  . Drug use: No  . Sexual activity: Not on file

## 2017-12-14 NOTE — Procedures (Signed)
Lumbar Diagnostic Facet Joint Nerve Block with Fluoroscopic Guidance   Patient: Ronald [name]      Date of Birth: 04/24/1951 MRN: [id number] PCP: Woodroe ChenJohns, [name], MD      Visit Date: 11/28/2017   Universal Protocol:    Date/Time: 03/28/195:47 AM  Consent Given By: the patient  Position: PRONE  Additional Comments: Vital signs were monitored before and after the procedure. Patient was prepped and draped in the usual sterile fashion. The correct patient, procedure, and site was verified.   Injection Procedure Details:  Procedure Site One Meds Administered:  Meds ordered this encounter  Medications  . bupivacaine (MARCAINE) 0.5 % (with pres) injection 3 mL     Laterality: Bilateral  Location/Site:  L3-L4 L4-L5 L5-S1  Needle size: 22 ga.  Needle type:spinal  Needle Placement: Oblique pedical  Findings:   -Comments: There was excellent flow of contrast along the articular pillars without intravascular flow.  Procedure Details: The fluoroscope beam is vertically oriented in AP and then obliqued 15 to 20 degrees to the ipsilateral side of the desired nerve to achieve the "Scotty dog" appearance.  The skin over the target area of the junction of the superior articulating process and the transverse process (sacral ala if blocking the L5 dorsal rami) was locally anesthetized with a 1 ml volume of 1% Lidocaine without Epinephrine.  The spinal needle was inserted and advanced in a trajectory view down to the target.   After contact with periosteum and negative aspirate for blood and CSF, correct placement without intravascular or epidural spread was confirmed by injecting 0.5 ml. of Isovue-250.  A spot radiograph was obtained of this image.    Next, a 0.5 ml. volume of the injectate described above was injected. The needle was then redirected to the other facet joint nerves mentioned above if needed.  Prior to the procedure, the patient was given a Pain Diary which was  completed for baseline measurements.  After the procedure, the patient rated their pain every 30 minutes and will continue rating at this frequency for a total of 5 hours.  The patient has been asked to complete the Diary and return to us by mail, fax or hand delivered as soon as possible.   Additional Comments:  The patient tolerated the procedure well Dressing: Band-Aid    Post-procedure details: Patient was observed during the procedure. Post-procedure instructions were reviewed.  Patient left the clinic in stable condition.

## 2017-12-26 ENCOUNTER — Ambulatory Visit (INDEPENDENT_AMBULATORY_CARE_PROVIDER_SITE_OTHER): Payer: Medicare HMO | Admitting: Physical Medicine and Rehabilitation

## 2017-12-26 ENCOUNTER — Encounter (INDEPENDENT_AMBULATORY_CARE_PROVIDER_SITE_OTHER): Payer: Self-pay | Admitting: Physical Medicine and Rehabilitation

## 2017-12-26 VITALS — BP 160/90 | HR 61

## 2017-12-26 DIAGNOSIS — G8929 Other chronic pain: Secondary | ICD-10-CM

## 2017-12-26 DIAGNOSIS — M47816 Spondylosis without myelopathy or radiculopathy, lumbar region: Secondary | ICD-10-CM

## 2017-12-26 DIAGNOSIS — M545 Low back pain, unspecified: Secondary | ICD-10-CM

## 2017-12-26 NOTE — Progress Notes (Signed)
Numeric Pain Rating Scale and Functional Assessment Average Pain 5 Pain Right Now 2 My pain is constant, sharp, stabbing and aching Pain is worse with: walking, bending and standing Pain improves with: rest and heat/ice   In the last MONTH (on 0-10 scale) has pain interfered with the following?  1. General activity like being  able to carry out your everyday physical activities such as walking, climbing stairs, carrying groceries, or moving a chair?  Rating(7)  2. Relation with others like being able to carry out your usual social activities and roles such as  activities at home, at work and in your community. Rating(3)  3. Enjoyment of life such that you have  been bothered by emotional problems such as feeling anxious, depressed or irritable?  Rating(4)

## 2017-12-26 NOTE — Progress Notes (Signed)
Ronald [name] - 67 y.o. male MRN [id number]  Date of birth: 12/10/1950  Office Visit Note: Visit Date: 12/26/2017 PCP: Woodroe ChenJohns, [name], MD Referred by: Woodroe ChenJohns, [name], MD  Subjective: Chief Complaint  Patient presents with  . Lower Back - Pain   HPI: Mr. Ronald [name] is a very pleasant 67 year old gentleman that we have seen on a few occasions now for mainly axial low back pain which is chronic that he has had for several years worsening over the last 6-7 months.  His case is complicated by history of Parkinson's disease.  He has been on Sinemet since 2014.  He actually moves fairly well but continues to have axial low back pain.  His history is dictated completely in several prior notes and these can be looked at for reference.  Basically he is followed for his orthopedic care by Dr. Cleophas DunkerWhitfield in our office.  He was seen by [facility]ilpatrick [facility] who is a Midwifeneurosurgeon.  She suggested medial branch blocks of the lower facet joints do to his axial low back pain and pain with standing and no real radicular complaints.  MRI had been obtained this is reviewed again below.  He has had physical therapy and this is documented.  He had initial relief with therapy but continues to have pretty significant back pain that does limit what he can do.  Is worse with going from sit to stand and lying down and twisting.  He says it is especially bad the first few steps in the morning.  He has had an epidural injection performed by Dr. Courtney ParisSchutte at [facility].  This was a bilateral L3 transforaminal injection that did not give him much relief.  We actually completed an intralaminar injection as well that gave him some mild relief.  We then went on to complete a diagnostic medial branch blocks which gave him great relief and he had more than 60% relief and felt like a new person for several hours and even longer.  This only lasted a day or 2.  We have had multiple office visits with the patient and his wife concerning his low  back.  He comes in today with his daughter.  She helps provide some of the history as well.  He now has had a second block and a double block paradigm with pain diary and he did achieve much more than 60% relief and had really good relief initially for the first hour.  This is documented.  We tried to get approval for radiofrequency ablation through his insurance which is [employer] and a third party which is [employer].  We did receive a denial that basically said the denial was because there was no documented physical exam finding showing pain from the facet joints.   Review of Systems  Constitutional: Negative for chills, fever, malaise/fatigue and weight loss.  HENT: Negative for hearing loss and sinus pain.   Eyes: Negative for blurred vision, double vision and photophobia.  Respiratory: Negative for cough and shortness of breath.   Cardiovascular: Negative for chest pain, palpitations and leg swelling.  Gastrointestinal: Negative for abdominal pain, nausea and vomiting.  Genitourinary: Negative for flank pain.  Musculoskeletal: Positive for back pain. Negative for myalgias.  Skin: Negative for itching and rash.  Neurological: Negative for tremors, focal weakness and weakness.  Endo/Heme/Allergies: Negative.   Psychiatric/Behavioral: Negative for depression.  All other systems reviewed and are negative.  Otherwise per HPI.  Assessment & Plan: Visit Diagnoses:  1. Spondylosis without myelopathy or radiculopathy,  lumbar region   2. Chronic bilateral low back pain without sciatica     Plan: Findings:  Chronic worsening axial low back pain which has not been relieved with physical therapy and medication management including muscle relaxers and pain medications and anti-inflammatory.  She has been evaluated by an orthopedic surgeon as well as a neurosurgeon.  Neurosurgeon suggested medial branch blocks which I ultimately performed in the double block paradigm he did get good relief which is  documented with those.  Clinical exam finding today shows that he has concordant low back pain with facet loading maneuver which is extension rotation of the lumbar spine.  He has no pain that would indicate any other source of pain including no pain with hip rotation no nerve tension signs.  Having said that we all know that there is no specific physical exam finding that has a good sensitivity or specificity to indicate specific pain generator.  The gold standard is diagnostic facet block using fluoroscopic guidance which he has had.  I respectfully request to be able to complete radiofrequency ablation of his lumbar spine as noted in the prior notes.    Meds & Orders: No orders of the defined types were placed in this encounter.  No orders of the defined types were placed in this encounter.   Follow-up: Return if symptoms worsen or fail to improve, for Hopefully for radiofrequency ablation.   Procedures: No procedures performed  No notes on file   Clinical History: 06/12/2017 Lumbar Spine MRI FINDINGS:  Bones:  Vertebral body heights are maintained. No acute fracture. No focal lesions. Spinal cord/conus: No abnormal signal or mass.  Conus terminates at normal level. Alignment: No significant subluxation.  Degenerative changes: T12-L1: Mild disc space height loss. Broad-based disc bulge. Bilateral facet degenerative changes. Moderate bilateral foraminal stenosis. Partial effacement of ventral thecal sac.  L1-L2: Mild facet degenerative changes. Mild to moderate left and mild right foraminal stenosis. No significant central canal stenosis.  L2-L3: Broad-based disc bulge. Moderate facet degenerative changes with trace facet joint effusions. Moderate to advanced central canal stenosis. Marked left with impingement of exiting left-sided nerve root and at least moderate right foraminal  stenosis. Wavy appearance nerve roots within the spinal canal above this level likely related to the degree  significant central canal stenosis.    L3-L4: Broad-based disc bulge with central disc extrusion extending cephalad along the lower aspect of the L3 vertebral body. Mild to moderate central canal stenosis when coupled with moderate facet degenerative changes. Marked left and right foraminal  stenosis. Likely impingement of exiting nerve roots bilaterally.   L4-L5: Broad based disc bulge. Moderate bilateral facet degenerative changes. Disc space height loss. Mild central canal stenosis. Marked bilateral foraminal stenosis with disc material likely abutting the exiting L4 nerve roots. Moderate to advanced  endplate degenerative changes.  L5-S1: Marked bilateral facet degenerative changes. Small disc bulge. Mild effacement of the anterior aspect of the thecal sac. Moderate to advanced right and marked left foraminal stenosis.  Paraspinal soft tissues: Unremarkable  IMPRESSION: Extensive diffuse degenerative changes throughout the lumbar spine.  Central canal stenosis greatest L2/L3 where it is moderate to advanced secondary to disc bulge and posterior hypertrophic changes. Mild-to-moderate central canal stenosis L3/L4 secondary to disc bulge and superimposed central disc extrusion.  Extensive foraminal stenosis L2/L3, L3/L4, L4/L5, and L5/S1 with likely abutment of the exiting nerve roots.   He reports that he has quit smoking. He has never used smokeless tobacco. No results for input(s): HGBA1C,  LABURIC in the last 8760 hours.  Objective:  VS:  HT:    WT:   BMI:     BP:(!) 160/90  HR:61bpm  TEMP: ( )  RESP:  Physical Exam  Constitutional: He is oriented to person, place, and time. He appears well-developed and well-nourished. No distress.  HENT:  Head: Normocephalic and atraumatic.  Eyes: Pupils are equal, round, and reactive to light. Conjunctivae are normal.  Neck: Neck supple. No tracheal deviation present.  Cardiovascular: Regular rhythm and intact distal pulses.    Pulmonary/Chest: Effort normal. No respiratory distress.  Abdominal: Soft. There is no tenderness.  Musculoskeletal:  Patient ambulates with a forward flexed spine but without aid.  He ambulates slowly but does not have a shuffling gait.  He does have pain going from sit to stand.  Clinical exam of the lumbar spine shows concordant low back pain with facet joint loading and extension and rotation bilaterally left and right.  He has no pain with hip rotations.  He has no pain over the PSIS.  He has no paraspinal tenderness but he does have some pain walking over the vertebral bodies of the lower spine.  He has a negative slump test bilaterally without any cogwheeling.  He has no clonus bilaterally.  He has good strength in the legs bilaterally with 5 out of 5 strength in hip flexion and extension as well as knee flexion extension and dorsiflexion and EHL.  He has a negative Patrick's exam bilaterally.  Neurological: He is alert and oriented to person, place, and time.  Skin: Skin is warm and dry. No rash noted. No erythema.  Psychiatric: He has a normal mood and affect.  Nursing note and vitals reviewed.   Ortho Exam Imaging: No results found.  Past Medical/Family/Surgical/Social History: Medications & Allergies reviewed per EMR, new medications updated. There are no active problems to display for this patient.  Past Medical History:  Diagnosis Date  . Diabetes mellitus without complication (HCC)   . Hypertension   . Parkinson's disease (HCC)    Family History  Problem Relation Age of Onset  . Diabetes Mother   . Stroke Father    Past Surgical History:  Procedure Laterality Date  . APPENDECTOMY    . back epidural   08/2017  . CORONARY ANGIOPLASTY WITH STENT PLACEMENT  08/19/2012  . KNEE SURGERY     Social History   Occupational History  . Occupation: Retired   Tobacco Use  . Smoking status: Former Games developer  . Smokeless tobacco: Never Used  Substance and Sexual Activity  .  Alcohol use: Yes  . Drug use: No  . Sexual activity: Not on file

## 2018-01-24 ENCOUNTER — Ambulatory Visit (INDEPENDENT_AMBULATORY_CARE_PROVIDER_SITE_OTHER): Payer: Medicare HMO | Admitting: Physical Medicine and Rehabilitation

## 2018-01-24 ENCOUNTER — Encounter (INDEPENDENT_AMBULATORY_CARE_PROVIDER_SITE_OTHER): Payer: Self-pay | Admitting: Physical Medicine and Rehabilitation

## 2018-01-24 ENCOUNTER — Ambulatory Visit (INDEPENDENT_AMBULATORY_CARE_PROVIDER_SITE_OTHER): Payer: Self-pay

## 2018-01-24 VITALS — BP 146/78 | HR 69

## 2018-01-24 DIAGNOSIS — M47816 Spondylosis without myelopathy or radiculopathy, lumbar region: Secondary | ICD-10-CM

## 2018-01-24 MED ORDER — METHYLPREDNISOLONE ACETATE 80 MG/ML IJ SUSP: 80.0000 mg | Freq: Once | INTRAMUSCULAR | Status: DC

## 2018-01-24 NOTE — Progress Notes (Signed)
 .  Numeric Pain Rating Scale and Functional Assessment Average Pain 9   In the last MONTH (on 0-10 scale) has pain interfered with the following?  1. General activity like being  able to carry out your everyday physical activities such as walking, climbing stairs, carrying groceries, or moving a chair?  Rating(9)   +Driver, -BT, -Dye Allergies.  

## 2018-01-31 ENCOUNTER — Ambulatory Visit (INDEPENDENT_AMBULATORY_CARE_PROVIDER_SITE_OTHER): Payer: Medicare HMO | Admitting: Physical Medicine and Rehabilitation

## 2018-01-31 ENCOUNTER — Encounter (INDEPENDENT_AMBULATORY_CARE_PROVIDER_SITE_OTHER): Payer: Self-pay | Admitting: Physical Medicine and Rehabilitation

## 2018-01-31 ENCOUNTER — Ambulatory Visit (INDEPENDENT_AMBULATORY_CARE_PROVIDER_SITE_OTHER): Payer: Self-pay

## 2018-01-31 VITALS — BP 124/71 | HR 66

## 2018-01-31 DIAGNOSIS — M545 Low back pain: Secondary | ICD-10-CM

## 2018-01-31 DIAGNOSIS — G8929 Other chronic pain: Secondary | ICD-10-CM

## 2018-01-31 DIAGNOSIS — M47816 Spondylosis without myelopathy or radiculopathy, lumbar region: Secondary | ICD-10-CM | POA: Diagnosis not present

## 2018-01-31 MED ORDER — METHYLPREDNISOLONE ACETATE 80 MG/ML IJ SUSP
80.0000 mg | Freq: Once | INTRAMUSCULAR | Status: AC
  Administered 2018-01-31: 80 mg

## 2018-01-31 NOTE — Progress Notes (Signed)
 .  Numeric Pain Rating Scale and Functional Assessment Average Pain 8   In the last MONTH (on 0-10 scale) has pain interfered with the following?  1. General activity like being  able to carry out your everyday physical activities such as walking, climbing stairs, carrying groceries, or moving a chair?  Rating(6)   +Driver, -BT, -Dye Allergies.  

## 2018-01-31 NOTE — Patient Instructions (Signed)

## 2018-02-06 NOTE — Patient Instructions (Signed)

## 2018-02-06 NOTE — Progress Notes (Signed)
Mohamed Portlock - 67 y.o. male MRN [id number]  Date of birth: 20-Apr-1951  Office Visit Note: Visit Date: 01/24/2018 PCP: Woodroe Chen, MD Referred by: Woodroe Chen, MD  Subjective: Chief Complaint  Patient presents with  . Lower Back - Pain  . Right Leg - Pain  . Left Leg - Pain   HPI: Mr. Vise is a very pleasant 67 year old gentleman who is present with his wife again who provides some of the history.  He comes in today for planned left-sided radiofrequency ablation of the L3-4, L4-5 and L5-S1 facet joints.  This would be the left elbow 2, L3 and L4 medial branches and left L5 dorsal rami.  He is having axial low back pain worse on the left than right but bilateral.  His history is well-documented and documented ad infinitum.  This is been a relatively long road for him given the fact that he has had 2 practitioners looking at his lumbar spine from an interventional standpoint as well as difficulty getting approval through [employer] and there is third-party designate her, [employer].  Nonetheless he is here today finally for the ablation.  He has axial low back pain again he is failed conservative care which is all documented.  He has had evaluation by a neurosurgeon Dr.  Petra Kuba at [place].  He does have some level of stenosis.  He does have referral into the hip on the left.  He has no radicular pain or paresthesias.  His case is complicated by Parkinson's disease and some rigidity.   ROS Otherwise per HPI.  Assessment & Plan: Visit Diagnoses:  1. Spondylosis without myelopathy or radiculopathy, lumbar region     Plan: No additional findings.   Meds & Orders:  Meds ordered this encounter  Medications  . DISCONTD: methylPREDNISolone acetate (DEPO-MEDROL) injection 80 mg    Orders Placed This Encounter  Procedures  . Radiofrequency,Lumbar  . XR C-ARM NO REPORT    Follow-up: No follow-ups on file.   Procedures: No procedures performed  Lumbar Facet Joint Nerve  Denervation  Patient: Argyle Gustafson      Date of Birth: 12/05/1950 MRN: [id number] PCP: Woodroe Chen, MD      Visit Date: 01/24/2018   Universal Protocol:    Date/Time: 05/21/196:22 AM  Consent Given By: the patient  Position: PRONE  Additional Comments: Vital signs were monitored before and after the procedure. Patient was prepped and draped in the usual sterile fashion. The correct patient, procedure, and site was verified.   Injection Procedure Details:  Procedure Site One Meds Administered:  Meds ordered this encounter  Medications  . DISCONTD: methylPREDNISolone acetate (DEPO-MEDROL) injection 80 mg     Laterality: Left  Location/Site: Left L2, L3 and L4 medial branches and left L5 dorsal rami L3-L4 L4-L5 L5-S1  Needle size: 18 G  Needle type: Radiofrequency cannula  Needle Placement: Along juncture of superior articular process and transverse pocess  Findings:  -Comments:  Procedure Details: For each desired target nerve, the corresponding transverse process (sacral ala for the L5 dorsal rami) was identified and the fluoroscope was positioned to square off the endplates of the corresponding vertebral body to achieve a true AP midline view.  The beam was then obliqued 15 to 20 degrees and caudally tilted 15 to 20 degrees to line up a trajectory along the target nerves. The skin over the target of the junction of superior articulating process and transverse process (sacral ala for the L5 dorsal rami) was infiltrated with 1ml of  1% Lidocaine without Epinephrine.  The 18 gauge 10mm active tip outer cannula was advanced in trajectory view to the target.  This procedure was repeated for each target nerve.  Then, for all levels, the outer cannula placement was fine-tuned and the position was then confirmed with bi-planar imaging.    Test stimulation was done both at sensory and motor levels to ensure there was no radicular stimulation. The target tissues were then  infiltrated with 1 ml of 1% Lidocaine without Epinephrine. Subsequently, a percutaneous neurotomy was carried out for 90 seconds at 80 degrees Celsius.   After the completion of the lesions, 1 ml of injectate was delivered. It was then repeated for each facet joint nerve mentioned above. Appropriate radiographs were obtained to verify the probe placement during the neurotomy.   Additional Comments:  The patient tolerated the procedure well Dressing: Band-Aid    Post-procedure details: Patient was observed during the procedure. Post-procedure instructions were reviewed.  Patient left the clinic in stable condition.      Clinical History: 06/12/2017 Lumbar Spine MRI FINDINGS:  Bones:  Vertebral body heights are maintained. No acute fracture. No focal lesions. Spinal cord/conus: No abnormal signal or mass.  Conus terminates at normal level. Alignment: No significant subluxation.  Degenerative changes: T12-L1: Mild disc space height loss. Broad-based disc bulge. Bilateral facet degenerative changes. Moderate bilateral foraminal stenosis. Partial effacement of ventral thecal sac.  L1-L2: Mild facet degenerative changes. Mild to moderate left and mild right foraminal stenosis. No significant central canal stenosis.  L2-L3: Broad-based disc bulge. Moderate facet degenerative changes with trace facet joint effusions. Moderate to advanced central canal stenosis. Marked left with impingement of exiting left-sided nerve root and at least moderate right foraminal  stenosis. Wavy appearance nerve roots within the spinal canal above this level likely related to the degree significant central canal stenosis.    L3-L4: Broad-based disc bulge with central disc extrusion extending cephalad along the lower aspect of the L3 vertebral body. Mild to moderate central canal stenosis when coupled with moderate facet degenerative changes. Marked left and right foraminal  stenosis. Likely impingement of exiting  nerve roots bilaterally.   L4-L5: Broad based disc bulge. Moderate bilateral facet degenerative changes. Disc space height loss. Mild central canal stenosis. Marked bilateral foraminal stenosis with disc material likely abutting the exiting L4 nerve roots. Moderate to advanced  endplate degenerative changes.  L5-S1: Marked bilateral facet degenerative changes. Small disc bulge. Mild effacement of the anterior aspect of the thecal sac. Moderate to advanced right and marked left foraminal stenosis.  Paraspinal soft tissues: Unremarkable  IMPRESSION: Extensive diffuse degenerative changes throughout the lumbar spine.  Central canal stenosis greatest L2/L3 where it is moderate to advanced secondary to disc bulge and posterior hypertrophic changes. Mild-to-moderate central canal stenosis L3/L4 secondary to disc bulge and superimposed central disc extrusion.  Extensive foraminal stenosis L2/L3, L3/L4, L4/L5, and L5/S1 with likely abutment of the exiting nerve roots.   He reports that he has quit smoking. He has never used smokeless tobacco. No results for input(s): HGBA1C, LABURIC in the last 8760 hours.  Objective:  VS:  HT:    WT:   BMI:     BP:(!) 146/78  HR:69bpm  TEMP: ( )  RESP:  Physical Exam  Ortho Exam Imaging: No results found.  Past Medical/Family/Surgical/Social History: Medications & Allergies reviewed per EMR, new medications updated. There are no active problems to display for this patient.  Past Medical History:  Diagnosis Date  . Diabetes  mellitus without complication (HCC)   . Hypertension   . Parkinson's disease (HCC)    Family History  Problem Relation Age of Onset  . Diabetes Mother   . Stroke Father    Past Surgical History:  Procedure Laterality Date  . APPENDECTOMY    . back epidural   08/2017  . CORONARY ANGIOPLASTY WITH STENT PLACEMENT  08/19/2012  . KNEE SURGERY     Social History   Occupational History  . Occupation: Retired   Tobacco Use    . Smoking status: Former Games developer  . Smokeless tobacco: Never Used  Substance and Sexual Activity  . Alcohol use: Yes  . Drug use: No  . Sexual activity: Not on file

## 2018-02-06 NOTE — Procedures (Signed)
Lumbar Facet Joint Nerve Denervation  Patient: Ronald [name]      Date of Birth: 1950-11-24 MRN: [id number] PCP: Woodroe Chen, MD      Visit Date: 01/24/2018   Universal Protocol:    Date/Time: 05/21/196:22 AM  Consent Given By: the patient  Position: PRONE  Additional Comments: Vital signs were monitored before and after the procedure. Patient was prepped and draped in the usual sterile fashion. The correct patient, procedure, and site was verified.   Injection Procedure Details:  Procedure Site One Meds Administered:  Meds ordered this encounter  Medications  . DISCONTD: methylPREDNISolone acetate (DEPO-MEDROL) injection 80 mg     Laterality: Left  Location/Site: Left L2, L3 and L4 medial branches and left L5 dorsal rami L3-L4 L4-L5 L5-S1  Needle size: 18 G  Needle type: Radiofrequency cannula  Needle Placement: Along juncture of superior articular process and transverse pocess  Findings:  -Comments:  Procedure Details: For each desired target nerve, the corresponding transverse process (sacral ala for the L5 dorsal rami) was identified and the fluoroscope was positioned to square off the endplates of the corresponding vertebral body to achieve a true AP midline view.  The beam was then obliqued 15 to 20 degrees and caudally tilted 15 to 20 degrees to line up a trajectory along the target nerves. The skin over the target of the junction of superior articulating process and transverse process (sacral ala for the L5 dorsal rami) was infiltrated with 1ml of 1% Lidocaine without Epinephrine.  The 18 gauge 10mm active tip outer cannula was advanced in trajectory view to the target.  This procedure was repeated for each target nerve.  Then, for all levels, the outer cannula placement was fine-tuned and the position was then confirmed with bi-planar imaging.    Test stimulation was done both at sensory and motor levels to ensure there was no radicular stimulation. The  target tissues were then infiltrated with 1 ml of 1% Lidocaine without Epinephrine. Subsequently, a percutaneous neurotomy was carried out for 90 seconds at 80 degrees Celsius.   After the completion of the lesions, 1 ml of injectate was delivered. It was then repeated for each facet joint nerve mentioned above. Appropriate radiographs were obtained to verify the probe placement during the neurotomy.   Additional Comments:  The patient tolerated the procedure well Dressing: Band-Aid    Post-procedure details: Patient was observed during the procedure. Post-procedure instructions were reviewed.  Patient left the clinic in stable condition.

## 2018-02-09 NOTE — Procedures (Signed)
Lumbar Facet Joint Nerve Denervation  Patient: Ronald [name]      Date of Birth: [birth year] MRN: [id number] PCP: Woodroe Chen, MD      Visit Date: 01/31/2018   Universal Protocol:    Date/Time: 05/24/192:40 PM  Consent Given By: the patient  Position: PRONE  Additional Comments: Vital signs were monitored before and after the procedure. Patient was prepped and draped in the usual sterile fashion. The correct patient, procedure, and site was verified.   Injection Procedure Details:  Procedure Site One Meds Administered:  Meds ordered this encounter  Medications  . methylPREDNISolone acetate (DEPO-MEDROL) injection 80 mg     Laterality: Right  Location/Site:  L3-L4 L4-L5 L5-S1  Needle size: 18 G  Needle type: Radiofrequency cannula  Needle Placement: Along juncture of superior articular process and transverse pocess  Findings:  -Comments:  Procedure Details: For each desired target nerve, the corresponding transverse process (sacral ala for the L5 dorsal rami) was identified and the fluoroscope was positioned to square off the endplates of the corresponding vertebral body to achieve a true AP midline view.  The beam was then obliqued 15 to 20 degrees and caudally tilted 15 to 20 degrees to line up a trajectory along the target nerves. The skin over the target of the junction of superior articulating process and transverse process (sacral ala for the L5 dorsal rami) was infiltrated with 1ml of 1% Lidocaine without Epinephrine.  The 18 gauge 10mm active tip outer cannula was advanced in trajectory view to the target.  This procedure was repeated for each target nerve.  Then, for all levels, the outer cannula placement was fine-tuned and the position was then confirmed with bi-planar imaging.    Test stimulation was done both at sensory and motor levels to ensure there was no radicular stimulation. The target tissues were then infiltrated with 1 ml of 1% Lidocaine  without Epinephrine. Subsequently, a percutaneous neurotomy was carried out for 60 seconds at 80 degrees Celsius. The procedure was repeated with the cannula rotated 90 degrees, for duration of 60 seconds, one additional time at each level for a total of two lesions per level.  After the completion of the two lesions, 1 ml of injectate was delivered. It was then repeated for each facet joint nerve mentioned above. Appropriate radiographs were obtained to verify the probe placement during the neurotomy.   Additional Comments:  The patient tolerated the procedure well Dressing: Band-Aid    Post-procedure details: Patient was observed during the procedure. Post-procedure instructions were reviewed.  Patient left the clinic in stable condition.

## 2018-02-09 NOTE — Progress Notes (Signed)
Karson Reede - 67 y.o. male MRN [id number]  Date of birth: January 03, 1951  Office Visit Note: Visit Date: 01/31/2018 PCP: Woodroe Chen, MD Referred by: Woodroe Chen, MD  Subjective: Chief Complaint  Patient presents with  . Lower Back - Pain   HPI: Mr. Schipani is a very pleasant 67 year old gentleman who is present with his wife again who provides some of the history.  He comes in today for planned right-sided radiofrequency ablation of the L3-4, L4-5 and L5-S1 facet joints.  This would be the right 2, L3 and L4 medial branches and left L5 dorsal rami.  He is having axial low back pain worse on the right but bilateral.  Has undergone left-sided ablation with some relief of back pain but continued hip pain and thigh pain with standing.  His history is well-documented and documented ad infinitum.  This is been a relatively long road for him given the fact that he has had 2 practitioners looking at his lumbar spine from an interventional standpoint as well as difficulty getting approval through [employer] and there is third-party designate her, [employer].  Nonetheless he is here today finally for the ablation.  He has axial low back pain again he is failed conservative care which is all documented.  He has had evaluation by a neurosurgeon Dr.  Petra Kuba at [place].  He does have some level of stenosis.  He does have referral into the hip on the left.  His case is complicated by Parkinson's disease and some rigidity.   ROS Otherwise per HPI.  Assessment & Plan: Visit Diagnoses:  1. Spondylosis without myelopathy or radiculopathy, lumbar region   2. Chronic bilateral low back pain without sciatica     Plan: No additional findings.   Meds & Orders:  Meds ordered this encounter  Medications  . methylPREDNISolone acetate (DEPO-MEDROL) injection 80 mg    Orders Placed This Encounter  Procedures  . Radiofrequency,Lumbar  . XR C-ARM NO REPORT    Follow-up: Return in about 1 month  (around 02/28/2018), or if symptoms worsen or fail to improve.   Procedures: No procedures performed  Lumbar Facet Joint Nerve Denervation  Patient: Jeyren Danowski      Date of Birth: [birth year] MRN: [id number] PCP: Woodroe Chen, MD      Visit Date: 01/31/2018   Universal Protocol:    Date/Time: 05/24/192:40 PM  Consent Given By: the patient  Position: PRONE  Additional Comments: Vital signs were monitored before and after the procedure. Patient was prepped and draped in the usual sterile fashion. The correct patient, procedure, and site was verified.   Injection Procedure Details:  Procedure Site One Meds Administered:  Meds ordered this encounter  Medications  . methylPREDNISolone acetate (DEPO-MEDROL) injection 80 mg     Laterality: Right  Location/Site:  L3-L4 L4-L5 L5-S1  Needle size: 18 G  Needle type: Radiofrequency cannula  Needle Placement: Along juncture of superior articular process and transverse pocess  Findings:  -Comments:  Procedure Details: For each desired target nerve, the corresponding transverse process (sacral ala for the L5 dorsal rami) was identified and the fluoroscope was positioned to square off the endplates of the corresponding vertebral body to achieve a true AP midline view.  The beam was then obliqued 15 to 20 degrees and caudally tilted 15 to 20 degrees to line up a trajectory along the target nerves. The skin over the target of the junction of superior articulating process and transverse process (sacral ala for the L5 dorsal rami)  was infiltrated with 1ml of 1% Lidocaine without Epinephrine.  The 18 gauge 10mm active tip outer cannula was advanced in trajectory view to the target.  This procedure was repeated for each target nerve.  Then, for all levels, the outer cannula placement was fine-tuned and the position was then confirmed with bi-planar imaging.    Test stimulation was done both at sensory and motor levels to ensure  there was no radicular stimulation. The target tissues were then infiltrated with 1 ml of 1% Lidocaine without Epinephrine. Subsequently, a percutaneous neurotomy was carried out for 60 seconds at 80 degrees Celsius. The procedure was repeated with the cannula rotated 90 degrees, for duration of 60 seconds, one additional time at each level for a total of two lesions per level.  After the completion of the two lesions, 1 ml of injectate was delivered. It was then repeated for each facet joint nerve mentioned above. Appropriate radiographs were obtained to verify the probe placement during the neurotomy.   Additional Comments:  The patient tolerated the procedure well Dressing: Band-Aid    Post-procedure details: Patient was observed during the procedure. Post-procedure instructions were reviewed.  Patient left the clinic in stable condition.      Clinical History: 06/12/2017 Lumbar Spine MRI FINDINGS:  Bones:  Vertebral body heights are maintained. No acute fracture. No focal lesions. Spinal cord/conus: No abnormal signal or mass.  Conus terminates at normal level. Alignment: No significant subluxation.  Degenerative changes: T12-L1: Mild disc space height loss. Broad-based disc bulge. Bilateral facet degenerative changes. Moderate bilateral foraminal stenosis. Partial effacement of ventral thecal sac.  L1-L2: Mild facet degenerative changes. Mild to moderate left and mild right foraminal stenosis. No significant central canal stenosis.  L2-L3: Broad-based disc bulge. Moderate facet degenerative changes with trace facet joint effusions. Moderate to advanced central canal stenosis. Marked left with impingement of exiting left-sided nerve root and at least moderate right foraminal  stenosis. Wavy appearance nerve roots within the spinal canal above this level likely related to the degree significant central canal stenosis.    L3-L4: Broad-based disc bulge with central disc extrusion  extending cephalad along the lower aspect of the L3 vertebral body. Mild to moderate central canal stenosis when coupled with moderate facet degenerative changes. Marked left and right foraminal  stenosis. Likely impingement of exiting nerve roots bilaterally.   L4-L5: Broad based disc bulge. Moderate bilateral facet degenerative changes. Disc space height loss. Mild central canal stenosis. Marked bilateral foraminal stenosis with disc material likely abutting the exiting L4 nerve roots. Moderate to advanced  endplate degenerative changes.  L5-S1: Marked bilateral facet degenerative changes. Small disc bulge. Mild effacement of the anterior aspect of the thecal sac. Moderate to advanced right and marked left foraminal stenosis.  Paraspinal soft tissues: Unremarkable  IMPRESSION: Extensive diffuse degenerative changes throughout the lumbar spine.  Central canal stenosis greatest L2/L3 where it is moderate to advanced secondary to disc bulge and posterior hypertrophic changes. Mild-to-moderate central canal stenosis L3/L4 secondary to disc bulge and superimposed central disc extrusion.  Extensive foraminal stenosis L2/L3, L3/L4, L4/L5, and L5/S1 with likely abutment of the exiting nerve roots.   He reports that he has quit smoking. He has never used smokeless tobacco. No results for input(s): HGBA1C, LABURIC in the last 8760 hours.  Objective:  VS:  HT:    WT:   BMI:     BP:124/71  HR:66bpm  TEMP: ( )  RESP:  Physical Exam  Ortho Exam Imaging: No results found.  Past Medical/Family/Surgical/Social History: Medications & Allergies reviewed per EMR, new medications updated. There are no active problems to display for this patient.  Past Medical History:  Diagnosis Date  . Diabetes mellitus without complication (HCC)   . Hypertension   . Parkinson's disease (HCC)    Family History  Problem Relation Age of Onset  . Diabetes Mother   . Stroke Father    Past Surgical History:    Procedure Laterality Date  . APPENDECTOMY    . back epidural   08/2017  . CORONARY ANGIOPLASTY WITH STENT PLACEMENT  08/19/2012  . KNEE SURGERY     Social History   Occupational History  . Occupation: Retired   Tobacco Use  . Smoking status: Former Games developer  . Smokeless tobacco: Never Used  Substance and Sexual Activity  . Alcohol use: Yes  . Drug use: No  . Sexual activity: Not on file

## 2018-03-20 ENCOUNTER — Other Ambulatory Visit: Payer: Self-pay

## 2018-03-20 MED ORDER — CARBIDOPA-LEVODOPA 25-100 MG PO TABS: 2.0000 | ORAL_TABLET | Freq: Three times a day (TID) | ORAL | 3 refills | Status: DC

## 2018-05-02 ENCOUNTER — Ambulatory Visit: Payer: Medicare HMO | Admitting: Neurology

## 2018-05-29 ENCOUNTER — Encounter: Payer: Self-pay | Admitting: Neurology

## 2018-05-29 ENCOUNTER — Ambulatory Visit: Payer: Medicare HMO | Admitting: Neurology

## 2018-05-29 VITALS — BP 126/72 | HR 60 | Ht 69.0 in | Wt 180.0 lb

## 2018-05-29 DIAGNOSIS — G20 Parkinson's disease: Secondary | ICD-10-CM

## 2018-05-29 NOTE — Patient Instructions (Addendum)
I would suggest you change or at least try for a few weeks your Sinemet Regimen to take 1 1/2 pills 4 times daily, namely at: 9 AM, 1 PM, 5 PM and 9 PM. You will still take a total of 6 pills a day.   Please monitor your weight, as you have lost some weight gradually. Please make sure you eat enough protein, may want to add Boost or Ensure on a regular basis.   Please check with your prescribing doctor whether you are supposed to take Mobic high dose daily.   Fall risk is real! Please remember to stand up slowly and get your bearings first turn slowly, no bending down to pick anything, no heavy lifting, be extra careful at night and first thing in the morning. Also, be careful in the Bathroom and the kitchen.

## 2018-05-29 NOTE — Progress Notes (Signed)
Subjective:    Patient ID: Ronald [name] is a 67 y.o. male.  HPI    Interim history:   Ronald [name] is a 66 yo Left-handed gentleman with an underlying medical history of diabetes, Bell's palsy some 10 years ago, overweight state, CAD with MI in 2013 and 3 stents placed at the time, and hypertension who presents for follow-up consultation of his Parkinson's disease. The patient is unaccompanied today. I last saw him on 11/02/2017, at which time he reported more back pain. He had undergone an injection in December 2018 but had found no relief. [place]. He was active physically but had to take a break because of back pain. He was taking Mobic for back pain. He had a slow reduction and his hemoglobin and red cell counts and I counseled him on the daily use of meloxicam.  Today, 05/29/2018: He reports doing better after back surgery. He sees a Publishing rights manager in [place]. He had interim back surgery, bilateral L2/3 decompression surgery on 03/06/2018. He has had physical therapy. Prior to that he had radiofrequency under Dr. Ernestina Patches, maybe 3 times. He has occasional dizziness and mild nausea, some loss of appetite. He feels that his weight has been fairly stable but looking back, he has lost about 17 pounds in the past year, 7 pounds since our last visit in February 2019. He does take the occasional nutrition supplements such as boost. He tries to go to the [facility] for his bicycling class and also plays golf. Unfortunately, he has fallen a few times in the past few months. Thankfully no major injuries, he admits that he tends to turn too quickly.   The patient's allergies, current medications, family history, past medical history, past social history, past surgical history and problem list were reviewed and updated as appropriate.    Previously (copied from previous notes for reference):   I saw him on 05/02/2017, at which time he was complaining of lower back pain. He had seen orthopedics  for this. He also had right knee pain, status post injection. He had not fallen thankfully. He was taking Sinemet 2 pills 3 times a day.   I saw him on 10/31/2016, at which time he reported doing okay, no recent new symptoms, in particular no significant memory loss or mood disorder constipation. He did have an episode of chest pain recently. He saw his cardiologist. He felt to be a little dehydrated and blood pressure was on the lower end of normal. He was noted to have some orthostatic hypotension. He was started on northera 100 mg tid, but had not filled it yet. He was trying to stay active and going to the [facility] 3 times a week. He was also interested in pursuing boxing classes for PD. He had lost a little bit of weight and reported some loss of appetite. I suggested he continue with Sinemet 2 pills 3 times a day. Advised to change positions slowly and be mindful of his blood pressure dropping potentially with sudden changes of position and he was reminded to stay well-hydrated.   I first met him on 04/27/2016 at the request of his primary care physician, at which time he reported a prior diagnosis of Parkinson's disease in 2014 and symptoms dating back to early 2014. He was exercising regularly, he was doing well on symptomatic treatment with Sinemet and I suggested he continue with the medication, at 2 pills 3 times a day. He was reminded to stay active physically and mentally and stable hydrated  and well-nourished.    04/27/2016: He was diagnosed with Parkinson's disease in 2014. Symptoms date back to beginning of 2014, and started with speech changes, was noted to dragging his feet.  He has been on Sinemet for the past 3 years, with gradual increase, currently 2 pills in the morning, 2 in the afternoon and 1 at night, was asked to reduce it, He is not sure why, seems to tolerate it well and believes it has helped. He is wondering however if there is any new medication available. He has not been on a  dopamine agonist before from what I understand, no Azilect, no other MAO B inhibitor either.  He exercises regularly and is enrolled in the PD spinning class at 2 different [employer]. Lives with his wife of 62 years in [place], and they have 3 grown daughters, 2 GC. He is a nonsmoker, drinks alcohol infrequently, is a retired Geophysicist/field seismologist.  I reviewed your office note from 02/08/2016. He used to see Dr. Marijean Bravo, neurologist out of [facility] by Dr. Marijean Bravo retired. He then saw another neurologist in the same office but she left. Prior office records from his previous neurologists are not available for my review today. We will request office records from Dr. Barbera Setters office, patient signed a ROI form today and is agreeable.   He denies any major memory or mood issues or sleep issues, does snore some, but no apneas reported, no hx of RBD. No FHx of PD.  He varies his C/L dose, 1st dose 6-9 AM, second dose around 12 or 1 PM and last dose around 8 PM.    He also plays golf, once or twice a week. He tries to stay well-hydrated, may not always drink enough water however.   His Past Medical History Is Significant For: Past Medical History:  Diagnosis Date  . Diabetes mellitus without complication ([place])   . Hypertension   . Parkinson's disease ([place])     His Past Surgical History Is Significant For: Past Surgical History:  Procedure Laterality Date  . APPENDECTOMY    . back epidural   08/2017  . CORONARY ANGIOPLASTY WITH STENT PLACEMENT  08/19/2012  . KNEE SURGERY      His Family History Is Significant For: Family History  Problem Relation Age of Onset  . Diabetes Mother   . Stroke Father     His Social History Is Significant For: Social History   Socioeconomic History  . Marital status: Married    Spouse name: Memory Dance   . Number of children: 3  . Years of education: Not on file  . Highest education level: Not on file  Occupational History  . Occupation: Retired   Scientific laboratory technician  . Financial  resource strain: Not on file  . Food insecurity:    Worry: Not on file    Inability: Not on file  . Transportation needs:    Medical: Not on file    Non-medical: Not on file  Tobacco Use  . Smoking status: Former Research scientist (life sciences)  . Smokeless tobacco: Never Used  Substance and Sexual Activity  . Alcohol use: Yes  . Drug use: No  . Sexual activity: Not on file  Lifestyle  . Physical activity:    Days per week: Not on file    Minutes per session: Not on file  . Stress: Not on file  Relationships  . Social connections:    Talks on phone: Not on file    Gets together: Not on file  Attends religious service: Not on file    Active member of club or organization: Not on file    Attends meetings of clubs or organizations: Not on file    Relationship status: Not on file  Other Topics Concern  . Not on file  Social History Narrative   Drinks about 1 cup of coffee a day     His Allergies Are:  No Known Allergies:   His Current Medications Are:  Outpatient Encounter Medications as of 05/29/2018  Medication Sig  . aspirin 81 MG chewable tablet Chew by mouth.  . baclofen (LIORESAL) 10 MG tablet 2 (two) times daily.   . carbidopa-levodopa (SINEMET IR) 25-100 MG tablet Take 2 tablets by mouth 3 (three) times daily.  Marland Kitchen lisinopril (PRINIVIL,ZESTRIL) 5 MG tablet Take 1 tablet daily.  . meloxicam (MOBIC) 7.5 MG tablet   . metoprolol succinate (TOPROL-XL) 25 MG 24 hr tablet Take by mouth.  . oxybutynin (DITROPAN) 5 MG tablet TAKE ONE TABLET BY MOUTH TWICE DAILY NEEDS  APPT  . pioglitazone (ACTOS) 15 MG tablet daily.  . sertraline (ZOLOFT) 50 MG tablet Take 50 mg by mouth at bedtime.  . simvastatin (ZOCOR) 40 MG tablet TAKE ONE TABLET BY MOUTH IN THE EVENING  . [DISCONTINUED] metFORMIN (GLUCOPHAGE) 500 MG tablet TAKE ONE TABLET BY MOUTH ONCE DAILY WITH BREAKFAST  . [DISCONTINUED] traMADol (ULTRAM) 50 MG tablet as needed.   No facility-administered encounter medications on file as of 05/29/2018.    :  Review of Systems:  Out of a complete 14 point review of systems, all are reviewed and negative with the exception of these symptoms as listed below:  Review of Systems  Neurological:       Pt presents today to discuss his PD. Pt has had back surgery since his last visit. Pt reports a few falls with no injuries.    Objective:  Neurological Exam  Physical Exam Physical Examination:   Vitals:   05/29/18 1257  BP: 126/72  Pulse: 60    General Examination: The patient is a very pleasant 67 y.o. male in no acute distress. He appears well-developed and well-nourished and well groomed. Good spirits.   HEENT:Normocephalic, atraumatic, pupils are equal, round and reactive to light and accommodation. Extraocular tracking is slightly impaired. His face is slightly asymmetric with stable left facial weakness noted, less twitching today. He has mild to moderate facial masking, has become a little worse. Neck is mildly rigid, stable. He has moderate hypophonia which is a little worse. No dysarthria. Airway examination revealed mild to moderate mouth dryness, adequate dental hygiene, no other changes.  Chest:Clear to auscultation without wheezing, rhonchi or crackles noted.  Heart:S1+S2+0, regular and normal without murmurs, rubs or gallops noted.   Abdomen:Soft, non-tender and non-distended with normal bowel sounds appreciated on auscultation.  Extremities:There is Trace edema in both lower extremities around the ankles mainly, stable.   Skin: Warm and dry without trophic changes noted.  Musculoskeletal: exam reveals no obvious joint deformities, tenderness or joint swelling or erythema, with the exception of slightly larger appearing right knee but it is not tender.   Neurologically:  Mental status: The patient is awake, alert and oriented in all 4 spheres. Hisimmediate and remote memory, attention, language skills and fund of knowledge are appropriate. There is no  evidence of aphasia, agnosia, apraxia or anomia. Speech is clear with normal prosody and enunciation. Thought process is linear. Mood is normaland affect is normal.  Cranial nerves II - XII are as  described above under HEENT exam. Motor exam: Normal bulk, strength for age, no drift or rebound,noresting tremortoday. He has mild to moderate difficulty with fine motor skills on the right including finger taps, hand movements and rapid alternating patting, foot taps and foot agility as well. On the left, he has overall milder findings. Reflexes are 1+. Sensory exam intact to light touch [email or web address] to moderate bradykinesia, no dyskinesias. He stands without significant difficulty and denies any lightheadedness. He has a decreased arm swing when walking, posture is age-appropriate or mildly stooped. He has mild trouble turning, tandem walk is not testable safely.  Assessment and Plan:   In summary, Ronald [name] a very pleasant 77 year oldleft-handed gentleman with an underlying medical history of coronary artery disease, status post stent placement, history of left-sided Bell's palsy, overweight state,degenerative back d/s, with s/p ESI about a year ago, s/p R knee injection,hypertension, type 2 diabetes, and status post lumbar spine surgery in June 2019, who presents for follow-up consultation of his right-sided predominant Parkinson's disease, with overall fairly stable findings. He has been on Sinemet 2 pills 3 times a day. He has had some nausea and appetite loss. Sometimes he feels dizzy. I suggested we break up the individual dose and spread out the Sinemet to 4 doses per day, therefore he will take 1-1/2 pills 4 times a day at 9 AM, 1 PM, 5 PM and 9 PM. He's willing to try this. He is advised to monitor his weight and his nutrition, add supplemental protein milkshake such as boost or ensure if needed. He is advised to talk to his prescribing doctor whether he still needs to be on high-dose  mobic especially since his back pain has improved. He takes generic mobic 15 mg daily. I suggested a six-month follow-up. I answered all his questions today and he was in agreement. He did not need a refill on the Sinemet as he has a 90 day prescription with refills. I spent 25 minutes in total face-to-face time with the patient, more than 50% of which was spent in counseling and coordination of care, reviewing test results, reviewing medication and discussing or reviewing the diagnosis of PD, its prognosis and treatment options. Pertinent laboratory and imaging test results that were available during this visit with the patient were reviewed by me and considered in my medical decision making (see chart for details).

## 2018-06-21 ENCOUNTER — Encounter (INDEPENDENT_AMBULATORY_CARE_PROVIDER_SITE_OTHER): Payer: Self-pay | Admitting: Orthopaedic Surgery

## 2018-06-21 ENCOUNTER — Ambulatory Visit (INDEPENDENT_AMBULATORY_CARE_PROVIDER_SITE_OTHER): Payer: Medicare HMO

## 2018-06-21 ENCOUNTER — Ambulatory Visit (INDEPENDENT_AMBULATORY_CARE_PROVIDER_SITE_OTHER): Payer: Medicare HMO | Admitting: Orthopaedic Surgery

## 2018-06-21 VITALS — BP 159/79 | Ht 69.0 in | Wt 180.0 lb

## 2018-06-21 DIAGNOSIS — M25562 Pain in left knee: Secondary | ICD-10-CM

## 2018-06-21 DIAGNOSIS — M1712 Unilateral primary osteoarthritis, left knee: Secondary | ICD-10-CM | POA: Diagnosis not present

## 2018-06-21 DIAGNOSIS — G8929 Other chronic pain: Secondary | ICD-10-CM

## 2018-06-21 MED ORDER — METHYLPREDNISOLONE ACETATE 40 MG/ML IJ SUSP
80.0000 mg | INTRAMUSCULAR | Status: AC | PRN
  Administered 2018-06-21: 80 mg

## 2018-06-21 MED ORDER — BUPIVACAINE HCL 0.5 % IJ SOLN
2.0000 mL | INTRAMUSCULAR | Status: AC | PRN
  Administered 2018-06-21: 2 mL via INTRA_ARTICULAR

## 2018-06-21 MED ORDER — LIDOCAINE HCL 1 % IJ SOLN
2.0000 mL | INTRAMUSCULAR | Status: AC | PRN
  Administered 2018-06-21: 2 mL

## 2018-06-21 NOTE — Progress Notes (Signed)
Office Visit Note   Patient: Markeith Jue           Date of Birth: 08-Sep-1951           MRN: [id number] Visit Date: 06/21/2018              Requested by: Woodroe Chen, MD [address] [place], [place] [address] PCP: Woodroe Chen, MD   Assessment & Plan: Visit Diagnoses:  1. Chronic pain of left knee   2. Unilateral primary osteoarthritis, left knee     Plan: Advanced osteoarthritis left knee.  We will plan on aspirating and injecting with cortisone.  Also having some feeling of instability and will try a spider brace.  Follow-Up Instructions: Return if symptoms worsen or fail to improve.   Orders:  Orders Placed This Encounter  Procedures  . Large Joint Inj: L knee  . XR KNEE 3 VIEW LEFT   No orders of the defined types were placed in this encounter.     Procedures: Large Joint Inj: L knee on 06/21/2018 1:42 PM Indications: pain and diagnostic evaluation Details: 25 G 1.5 in needle, anteromedial approach  Arthrogram: No  Medications: 2 mL lidocaine 1 %; 2 mL bupivacaine 0.5 %; 80 mg methylPREDNISolone acetate 40 MG/ML Aspirate: 30 mL clear Outcome: tolerated well, no immediate complications Procedure, treatment alternatives, risks and benefits explained, specific risks discussed. Consent was given by the patient. Patient was prepped and draped in the usual sterile fashion.       Clinical Data: No additional findings.   Subjective: Chief Complaint  Patient presents with  . Follow-up    RE CURRENT L KNEE PAIN FOR SEV YRS HAD FLUID REMOVED FEW YRS AGO. HAS SWELLING  Mr. Mapps is been seen a number of occasions in the past for osteoarthritis of his knees.  He recently developed some swelling of his left knee to the point of compromise.  He has had prior x-rays demonstrating osteoarthritis and is done well with cortisone injection.  He has a number of medical issues that compromise his activities including Parkinson's and a recent history of lumbar  laminectomy for apparent ruptured disc in June.  He has had several episodes where he feels like he might want to pass out apparently related some of his medicines.  He developed swelling and pain in his left knee without injury or trauma.  He has been using a cane.  He is tried some over-the-counter medicines.  HPI  Review of Systems  Constitutional: Negative for fatigue and fever.  HENT: Positive for ear pain.   Eyes: Negative for pain.  Respiratory: Positive for shortness of breath. Negative for cough.   Cardiovascular: Positive for leg swelling.  Gastrointestinal: Negative for constipation and diarrhea.  Genitourinary: Negative for difficulty urinating.  Musculoskeletal: Positive for back pain. Negative for neck pain.  Allergic/Immunologic: Negative for food allergies.  Neurological: Positive for weakness. Negative for numbness.  Hematological: Does not bruise/bleed easily.  Psychiatric/Behavioral: Positive for sleep disturbance.     Objective: Vital Signs: BP (!) 159/79 (BP Location: Right Arm, Patient Position: Sitting, Cuff Size: Normal)   Ht 5\' 9"  (1.753 m)   Wt 180 lb (81.6 kg)   BMI 26.58 kg/m   Physical Exam  Constitutional: He is oriented to person, place, and time. He appears well-developed and well-nourished.  HENT:  Mouth/Throat: Oropharynx is clear and moist.  Eyes: Pupils are equal, round, and reactive to light. EOM are normal.  Pulmonary/Chest: Effort normal.  Neurological: He is alert  and oriented to person, place, and time.  Skin: Skin is warm and dry.  Psychiatric: He has a normal mood and affect. His behavior is normal.    Ortho Exam awake alert and oriented x3.  Comfortable sitting.  Speech is quite slow.  A little bit of shaking consistent with Parkinson's.  Left knee with a positive effusion.  Slight increased varus with weightbearing.  Slow deliberate gait using a cane.  Pain mostly along the medial compartment.  Positive patellar crepitation.  No  popliteal pain.  No calf discomfort.  Pulses intact.  No instability  Specialty Comments:  No specialty comments available.  Imaging: Xr Knee 3 View Left  Result Date: 06/21/2018 Films of the left knee were obtained in several projections standing.  There is significant arthritis in all 3 compartments but particularly medially.  There is near bone-on-bone in the medial compartment associated with subchondral sclerosis and peripheral osteophytes.  Some narrowing at the patellofemoral joint with osteophytes maintenance of the lateral joint space but with subchondral sclerosis and osteophyte formation.  No ectopic calcification    PMFS History: There are no active problems to display for this patient.  Past Medical History:  Diagnosis Date  . Diabetes mellitus without complication (HCC)   . Hypertension   . Parkinson's disease (HCC)     Family History  Problem Relation Age of Onset  . Diabetes Mother   . Stroke Father     Past Surgical History:  Procedure Laterality Date  . APPENDECTOMY    . back epidural   08/2017  . BACK SURGERY    . CORONARY ANGIOPLASTY WITH STENT PLACEMENT  08/19/2012  . KNEE SURGERY     Social History   Occupational History  . Occupation: Retired   Tobacco Use  . Smoking status: Former Games developer  . Smokeless tobacco: Never Used  Substance and Sexual Activity  . Alcohol use: Yes  . Drug use: No  . Sexual activity: Not on file

## 2018-07-27 ENCOUNTER — Encounter (INDEPENDENT_AMBULATORY_CARE_PROVIDER_SITE_OTHER): Payer: Self-pay | Admitting: Orthopaedic Surgery

## 2018-07-27 ENCOUNTER — Ambulatory Visit (INDEPENDENT_AMBULATORY_CARE_PROVIDER_SITE_OTHER): Payer: Medicare HMO

## 2018-07-27 ENCOUNTER — Ambulatory Visit (INDEPENDENT_AMBULATORY_CARE_PROVIDER_SITE_OTHER): Payer: Medicare HMO | Admitting: Orthopaedic Surgery

## 2018-07-27 VITALS — BP 140/80 | HR 59 | Ht 69.0 in | Wt 180.0 lb

## 2018-07-27 DIAGNOSIS — M545 Low back pain, unspecified: Secondary | ICD-10-CM

## 2018-07-27 DIAGNOSIS — M25552 Pain in left hip: Secondary | ICD-10-CM

## 2018-07-27 DIAGNOSIS — G8929 Other chronic pain: Secondary | ICD-10-CM | POA: Diagnosis not present

## 2018-07-27 MED ORDER — LIDOCAINE HCL 1 % IJ SOLN
2.0000 mL | INTRAMUSCULAR | Status: AC | PRN
  Administered 2018-07-27: 2 mL

## 2018-07-27 MED ORDER — METHYLPREDNISOLONE ACETATE 40 MG/ML IJ SUSP
40.0000 mg | INTRAMUSCULAR | Status: AC | PRN
  Administered 2018-07-27: 40 mg via INTRAMUSCULAR

## 2018-07-27 NOTE — Progress Notes (Signed)
Office Visit Note   Patient: Ronald [name]           Date of Birth: 10-09-1950           MRN: [id number] Visit Date: 07/27/2018              Requested by: Woodroe Chen, MD [address] [place], [place] [address] PCP: Woodroe Chen, MD   Assessment & Plan: Visit Diagnoses:  1. Pain in left hip   2. Chronic midline low back pain, unspecified whether sciatica present     Plan: Left hip pain appears to originate from lumbar spine.  There is significant degenerative disc disease at L4-5.  Pain is localized so I injected it with Xylocaine and Depo-Medrol with significant relief of his pain.  See back as needed.  Follow-Up Instructions: No follow-ups on file.   Orders:  Orders Placed This Encounter  Procedures  . XR HIP UNILAT W OR W/O PELVIS 2-3 VIEWS LEFT  . XR Lumbar Spine 2-3 Views   No orders of the defined types were placed in this encounter.     Procedures: Trigger Point Inj Date/Time: 07/27/2018 1:26 PM Performed by: Valeria Batman, MD Authorized by: Valeria Batman, MD   Consent Given by:  Patient Indications:  Pain Total # of Trigger Points:  1 Location: back   Needle Size:  25 G Medications #1:  2 mL lidocaine 1 %; 40 mg methylPREDNISolone acetate 40 MG/ML     Clinical Data: No additional findings.   Subjective: Chief Complaint  Patient presents with  . Follow-up    L HIP PAIN 2-3 MO RADIATES DOWN LEFT LEG TO LEFT KNEE, NO INJURY OR INJECTIONS OR PRIOR SURGERY  Ronald [name] noted insidious onset of left hip pain within the last 2 to 3 months.. I saw him last month for evaluation of left knee pain.  Cortisone injection was performed with relief of his pain.  He still has some swelling but feeling better.  He does wear the spider brace.  Does have history of Parkinson's and uses a cane for ambulation.  As his left knee is feeling better he is experiencing more of the pain in the superior left hip and superior buttock area.  He has had some back  discomfort.  Oftentimes the pain will radiate along the entire aspect of his left leg to his foot.  No right-sided symptoms.  Has had recent lumbar laminectomy as previously identified.  Does use a lumbar support.  HPI  Review of Systems  Constitutional: Negative for fatigue and fever.  HENT: Negative for ear pain.   Eyes: Negative for pain.  Respiratory: Negative for cough and shortness of breath.   Cardiovascular: Positive for leg swelling.  Gastrointestinal: Negative for constipation and diarrhea.  Genitourinary: Negative for difficulty urinating.  Musculoskeletal: Positive for back pain. Negative for neck pain.  Skin: Negative for rash.  Allergic/Immunologic: Negative for food allergies.  Neurological: Positive for weakness and numbness.  Hematological: Does not bruise/bleed easily.  Psychiatric/Behavioral: Negative for sleep disturbance.     Objective: Vital Signs: BP 140/80 (BP Location: Right Arm, Patient Position: Sitting, Cuff Size: Normal)   Pulse (!) 59   Ht 5\' 9"  (1.753 m)   Wt 180 lb (81.6 kg)   BMI 26.58 kg/m   Physical Exam  Constitutional: He is oriented to person, place, and time. He appears well-developed and well-nourished.  HENT:  Mouth/Throat: Oropharynx is clear and moist.  Pulmonary/Chest: Effort normal.  Neurological: He is alert  and oriented to person, place, and time.  Skin: Skin is warm and dry.  Psychiatric: He has a normal mood and affect. His behavior is normal.    Ortho Exam history of Bell's palsy with some residual left  facial distortion.  Speech intact.  No shortness of breath or chest pain.  Awake alert and oriented x3.  No significant pain left knee but small effusion.  No instability.  Obvious weakness of both quads and hamstrings.  Painless range of motion left hip with internal/external rotation.  Subjectively having pain over the iliac crest and superior buttock.  Some percussible back pain along the left parasacral region  Specialty  Comments:  No specialty comments available.  Imaging: Xr Hip Unilat W Or W/o Pelvis 2-3 Views Left  Result Date: 07/27/2018 AP pelvis and lateral left hip were obtained.  Left hip joint appears to be clean.  Joint spaces are symmetrical between right and left hip.  No ectopic calcification.  Sacroiliac joints intact.  Lateral left hip without obvious arthritis    PMFS History: There are no active problems to display for this patient.  Past Medical History:  Diagnosis Date  . Diabetes mellitus without complication (HCC)   . Hypertension   . Parkinson's disease (HCC)     Family History  Problem Relation Age of Onset  . Diabetes Mother   . Stroke Father     Past Surgical History:  Procedure Laterality Date  . APPENDECTOMY    . back epidural   08/2017  . BACK SURGERY    . CORONARY ANGIOPLASTY WITH STENT PLACEMENT  08/19/2012  . KNEE SURGERY     Social History   Occupational History  . Occupation: Retired   Tobacco Use  . Smoking status: Former Games developer  . Smokeless tobacco: Never Used  Substance and Sexual Activity  . Alcohol use: Not Currently  . Drug use: No  . Sexual activity: Not on file

## 2018-11-27 ENCOUNTER — Encounter: Payer: Self-pay | Admitting: Neurology

## 2018-11-27 ENCOUNTER — Other Ambulatory Visit: Payer: Self-pay

## 2018-11-27 ENCOUNTER — Ambulatory Visit: Payer: Medicare HMO | Admitting: Neurology

## 2018-11-27 VITALS — BP 146/78 | HR 59 | Ht 69.0 in | Wt 182.0 lb

## 2018-11-27 DIAGNOSIS — M79604 Pain in right leg: Secondary | ICD-10-CM | POA: Diagnosis not present

## 2018-11-27 DIAGNOSIS — M545 Low back pain: Secondary | ICD-10-CM | POA: Diagnosis not present

## 2018-11-27 DIAGNOSIS — G8929 Other chronic pain: Secondary | ICD-10-CM

## 2018-11-27 DIAGNOSIS — M79605 Pain in left leg: Secondary | ICD-10-CM

## 2018-11-27 DIAGNOSIS — G20 Parkinson's disease: Secondary | ICD-10-CM

## 2018-11-27 MED ORDER — GABAPENTIN 100 MG PO CAPS: 100.0000 mg | ORAL_CAPSULE | Freq: Three times a day (TID) | ORAL | 3 refills | Status: DC

## 2018-11-27 NOTE — Progress Notes (Signed)
Subjective:    Patient ID: Ronald [name] is a 68 y.o. male.  HPI    Interim history:   Mr. Ronald [name] is a 68 yo Left-handed gentleman with an underlying medical history of diabetes, Bell's palsy some 10 years ago, overweight state, CAD with MI in 2013 and 3 stents placed at the time, and hypertension who presents for follow-up consultation of his Parkinson's disease. The patient is unaccompanied today. I last saw him on 05/29/2018, at which time he reported having done well after interim back surgery on 03/06/2018. He also had radiofrequency surgery prior to that. He reported occasional dizziness and nausea, some loss of appetite. I suggested we change his Sinemet from 2 pills 3 times a day to 1-1/2 pills 4 times a day. He reported falls. He was physically quite active, was going to the [facility]. His falls primarily occurred when he was turning.   Today, 11/27/2018: He reports shooting pains into his legs when he first gets up in the morning. He has a history of low back pain. He had lumbar spine x-rays in November 2019 which showed degenerative changes especially at L4-5. He has seen Dr. Durward [name]. Had an interim injection into the lower back maybe in December which did not help very much. Has aching in the muscles in the calves and also some sharp shooting pains in the distal legs. He believes his diabetes control is okay. He has regular blood work through his primary care physician. The pain has been debilitating enough at times to limit his exercise. He has not made any pens recently for that reason is he has to sit down.    The patient's allergies, current medications, family history, past medical history, past social history, past surgical history and problem list were reviewed and updated as appropriate.    Previously (copied from previous notes for reference):  11/02/2017, at which time he reported more back pain. He had undergone an injection in December 2018 but had found no relief. [place]  [place] He was active physically but had to take a break because of back pain. He was taking Mobic for back pain. He had a slow reduction and his hemoglobin and red cell counts and I counseled him on the daily use of meloxicam.     I saw him on 05/02/2017, at which time he was complaining of lower back pain. He had seen orthopedics for this. He also had right knee pain, status post injection. He had not fallen thankfully. He was taking Sinemet 2 pills 3 times a day.   I saw him on 10/31/2016, at which time he reported doing okay, no recent new symptoms, in particular no significant memory loss or mood disorder constipation. He did have an episode of chest pain recently. He saw his cardiologist. He felt to be a little dehydrated and blood pressure was on the lower end of normal. He was noted to have some orthostatic hypotension. He was started on northera 100 mg tid, but had not filled it yet. He was trying to stay active and going to the [facility] 3 times a week. He was also interested in pursuing boxing classes for PD. He had lost a little bit of weight and reported some loss of appetite. I suggested he continue with Sinemet 2 pills 3 times a day. Advised to change positions slowly and be mindful of his blood pressure dropping potentially with sudden changes of position and he was reminded to stay well-hydrated.   I first met him on  04/27/2016 at the request of his primary care physician, at which time he reported a prior diagnosis of Parkinson's disease in 2014 and symptoms dating back to early 2014. He was exercising regularly, he was doing well on symptomatic treatment with Sinemet and I suggested he continue with the medication, at 2 pills 3 times a day. He was reminded to stay active physically and mentally and stable hydrated and well-nourished.    04/27/2016: He was diagnosed with Parkinson's disease in 2014. Symptoms date back to beginning of 2014, and started with speech changes, was noted  to dragging his feet.  He has been on Sinemet for the past 3 years, with gradual increase, currently 2 pills in the morning, 2 in the afternoon and 1 at night, was asked to reduce it, He is not sure why, seems to tolerate it well and believes it has helped. He is wondering however if there is any new medication available. He has not been on a dopamine agonist before from what I understand, no Azilect, no other MAO B inhibitor either.  He exercises regularly and is enrolled in the PD spinning class at 2 different [employer]. Lives with his wife of 1 years in [place], and they have 3 grown daughters, 2 GC. He is a nonsmoker, drinks alcohol infrequently, is a retired Geophysicist/field seismologist.  I reviewed your office note from 02/08/2016. He used to see Dr. Marijean Bravo, neurologist out of [facility] but Dr. Marijean Bravo retired. He then saw another neurologist in the same office but she left. Prior office records from his previous neurologists are not available for my review today. We will request office records from Dr. Barbera [name] office, patient signed a ROI form today and is agreeable.   He denies any major memory or mood issues or sleep issues, does snore some, but no apneas reported, no hx of RBD. No FHx of PD.  He varies his C/L dose, 1st dose 6-9 AM, second dose around 12 or 1 PM and last dose around 8 PM.    He also plays golf, once or twice a week. He tries to stay well-hydrated, may not always drink enough water however.  His Past Medical History Is Significant For: Past Medical History:  Diagnosis Date  . Diabetes mellitus without complication ([place])   . Hypertension   . Parkinson's disease ([place])     His Past Surgical History Is Significant For: Past Surgical History:  Procedure Laterality Date  . APPENDECTOMY    . back epidural   08/2017  . BACK SURGERY    . CORONARY ANGIOPLASTY WITH STENT PLACEMENT  08/19/2012  . KNEE SURGERY      His Family History Is Significant For: Family History  Problem Relation Age of  Onset  . Diabetes Mother   . Stroke Father     His Social History Is Significant For: Social History   Socioeconomic History  . Marital status: Married    Spouse name: Memory Dance   . Number of children: 3  . Years of education: Not on file  . Highest education level: Not on file  Occupational History  . Occupation: Retired   Scientific laboratory technician  . Financial resource strain: Not on file  . Food insecurity:    Worry: Not on file    Inability: Not on file  . Transportation needs:    Medical: Not on file    Non-medical: Not on file  Tobacco Use  . Smoking status: Former Research scientist (life sciences)  . Smokeless tobacco: Never Used  Substance and Sexual  Activity  . Alcohol use: Not Currently  . Drug use: No  . Sexual activity: Not on file  Lifestyle  . Physical activity:    Days per week: Not on file    Minutes per session: Not on file  . Stress: Not on file  Relationships  . Social connections:    Talks on phone: Not on file    Gets together: Not on file    Attends religious service: Not on file    Active member of club or organization: Not on file    Attends meetings of clubs or organizations: Not on file    Relationship status: Not on file  Other Topics Concern  . Not on file  Social History Narrative   Drinks about 1 cup of coffee a day     His Allergies Are:  No Known Allergies:   His Current Medications Are:  Outpatient Encounter Medications as of 11/27/2018  Medication Sig  . aspirin 81 MG chewable tablet Chew by mouth.  . baclofen (LIORESAL) 10 MG tablet 2 (two) times daily.   . carbidopa-levodopa (SINEMET IR) 25-100 MG tablet Take 2 tablets by mouth 3 (three) times daily.  Marland Kitchen lisinopril (PRINIVIL,ZESTRIL) 5 MG tablet Take 1 tablet daily.  . meloxicam (MOBIC) 7.5 MG tablet   . metoprolol succinate (TOPROL-XL) 25 MG 24 hr tablet Take by mouth.  . oxybutynin (DITROPAN) 5 MG tablet TAKE ONE TABLET BY MOUTH TWICE DAILY NEEDS  APPT  . pioglitazone (ACTOS) 15 MG tablet daily.  . sertraline  (ZOLOFT) 50 MG tablet Take 50 mg by mouth at bedtime.  . simvastatin (ZOCOR) 40 MG tablet TAKE ONE TABLET BY MOUTH IN THE EVENING   No facility-administered encounter medications on file as of 11/27/2018.   :  Review of Systems:  Out of a complete 14 point review of systems, all are reviewed and negative with the exception of these symptoms as listed below: Review of Systems  Neurological:       Pt presents today to discuss his PD. Pt reports aches and pains. Pt is also complaining of shooting pains in his legs when he gets up in the morning.    Objective:  Neurological Exam  Physical Exam Physical Examination:   Vitals:   11/27/18 1357  BP: (!) 146/78  Pulse: (!) 59  SpO2: 98%   General Examination: The patient is a very pleasant 68 y.o. male in no acute distress. He appears well-developed and well-nourished and well groomed.   HEENT:Normocephalic, atraumatic, pupils are equal, round and reactive to light and accommodation. Extraocular tracking is slightly impaired. His face is slightly asymmetric with stable left facial weakness noted, less twitching today. Stable left facial weakness. He has mild to moderate facial masking. He has moderate hypophonia, maybe slight dysarthria at times today. Neck is mildly rigid, stable. He has moderate hypophonia which is a little worse. No dysarthria. Airway examination revealed mild to moderate mouth dryness, adequate dental hygiene, no other changes.  Chest:Clear to auscultation without wheezing, rhonchi or crackles noted.  Heart:S1+S2+0, regular and normal without murmurs, rubs or gallops noted.   Abdomen:Soft, non-tender and non-distended.  Extremities:There isno edema in both lower extremities today.   Skin: Warm and dry without trophic changes noted.  Musculoskeletal: exam reveals no obvious joint deformities, tenderness or joint swelling or erythema.   Neurologically:  Mental status: The patient is awake, alert and  oriented in all 4 spheres. Hisimmediate and remote memory, attention, language skills and fund of knowledge are  appropriate. There is no evidence of aphasia, agnosia, apraxia or anomia. Speech is clear with normal prosody and enunciation. Thought process is linear. Mood is normaland affect is normal.  Cranial nerves II - XII are as described above under HEENT exam. Motor exam: Normal bulk, strength for age, noresting tremortoday, except right hand intermittent tremor, slight dyskinesia of the right foot. He has mild to moderate difficulty with fine motor skills on the right including finger taps, hand movements and rapid alternating patting, foot taps and foot agility as well. On the left, he has overall milderfindings.Reflexes are 1+. Sensory exam intact to light touch [email or web address] to moderate bradykinesia, no other dyskinesias. He stands without significant difficulty but reports discomfort in the mid lower back. He walks with a slight limp on the left. He has a decreased arm swing when walking, posture is age-appropriate or mildly stooped. He has mild trouble turning, tandem walk is not testable safely.  Assessment and Plan:   In summary, Jovontae Banko a very pleasant 77 year oldleft-handed gentleman with an underlying medical history of coronary artery disease, status post stent placement, history of left-sided Bell's palsy, overweight state,degenerative back d/s, with s/p ESI, arthritis with s/p R knee injection,hypertension, type 2 diabetes, and status post lumbar spine surgery in June 2019, who presents for follow-up consultation of his right-sided predominant Parkinson's disease, with overall fairly stable findings. He has been on Sinemet 2 pills 3 times a day. Due to dizziness and nausea reported I suggested he change his medication regimen to Take Sinemet 1-1/2 pills 4 times a day. He reports that he tried it and it did not make much of a difference so for convenience he went back  to 2 pills 3 times a day. He has had more difficulty with low back pain and radiating pain to the legs bilaterally. He also has sharp/shooting pains in the legs distally. He had a recent lower back injection in December which he believes has not helped very much. He is encouraged to try gabapentin. I suggested a low dose with gradual increase. We talked about potential side effects of this medication, in particular, he is cautioned about the sedating effects of it and that it could affect his balance. He will start low at 100 mg strength at night only and gradually build up to 1 pill 3 times a day if needed and if tolerated. I provided written instructions and a new prescription. I would like for him to keep his Sinemet the same. He is encouraged to try to stay active physically. I suggested a six-month follow-up. I answered all his questions today and he was in agreement. I spent 25 minutes in total face-to-face time with the patient, more than 50% of which was spent in counseling and coordination of care, reviewing test results, reviewing medication and discussing or reviewing the diagnosis of PD, its prognosis and treatment options. Pertinent laboratory and imaging test results that were available during this visit with the patient were reviewed by me and considered in my medical decision making (see chart for details).

## 2018-11-27 NOTE — Patient Instructions (Addendum)
For your back pain and nerve pain in the legs: let's try you on Neurontin (gabapentin) 100 mg strength: Take 1 pill at night as needed, you can gradually increase to 1 pill three times a day.   The most common side effects reported are sedation or sleepiness. Rare side effects include balance problems, confusion.  You can continue with the Sinemet 2 pills 3 times a day.  Please follow up in 6 months.

## 2019-04-01 ENCOUNTER — Encounter: Payer: Self-pay | Admitting: Orthopaedic Surgery

## 2019-04-01 ENCOUNTER — Other Ambulatory Visit: Payer: Self-pay

## 2019-04-01 ENCOUNTER — Ambulatory Visit: Payer: Medicare HMO | Admitting: Orthopaedic Surgery

## 2019-04-01 VITALS — BP 190/72 | HR 56 | Resp 16 | Ht 69.0 in | Wt 182.0 lb

## 2019-04-01 DIAGNOSIS — G8929 Other chronic pain: Secondary | ICD-10-CM | POA: Diagnosis not present

## 2019-04-01 DIAGNOSIS — M1712 Unilateral primary osteoarthritis, left knee: Secondary | ICD-10-CM

## 2019-04-01 DIAGNOSIS — M5442 Lumbago with sciatica, left side: Secondary | ICD-10-CM | POA: Diagnosis not present

## 2019-04-01 NOTE — Progress Notes (Signed)
Office Visit Note   Patient: Ronald [name]           Date of Birth: June 13, 1951           MRN: [id number] Visit Date: 04/01/2019              Requested by: Marjory Sneddon, MD [address] [place],  [place] [address] PCP: Marjory Sneddon, MD   Assessment & Plan: Visit Diagnoses:  1. Chronic left-sided low back pain with left-sided sciatica   2. Unilateral primary osteoarthritis, left knee     Plan: Recurrent symptoms of osteoarthritis left knee.  Will inject with cortisone.  Also experiencing recurrent back pain particularly on the left side interfering with his walking.  Had prior surgery.  Will order MRI scan and return after scan  Follow-Up Instructions: Return After MRI scan lumbar spine.   Orders:  Orders Placed This Encounter  Procedures  . MR Lumbar Spine w/o contrast   No orders of the defined types were placed in this encounter.     Procedures: No procedures performed   Clinical Data: No additional findings.   Subjective: Chief Complaint  Patient presents with  . Left Knee - Pain   Ronald [name] is a 68 year old male who presents with left knee pain x 3-4 years. He has not had an injury. He had arthroscopic surgery by Dr. Durward Fortes in the 1990's. He doesn't have any swelling. He has popping, clicking and grinding noises. He has difficulty walking and difficulty sleeping at night. He has weakness and has fallen x 1 week. He has leg swelling throughout the day. He is diabetic.  Prior films in October 2019 demonstrated significant osteoarthritis in all 3 compartments.  There was significant narrowing of the medial compartment with near end-stage changes.  Has had good response to cortisone in the past.  Has had prior low back surgery.  Presently having considerable discomfort to the left side of his back and referred discomfort to his left lower extremity has history of Parkinson's and diabetes.  Uses a cane to assist with ambulation  HPI  Review of Systems   Constitutional: Positive for fatigue.  HENT: Positive for trouble swallowing.   Eyes: Positive for visual disturbance.  Respiratory: Positive for shortness of breath.   Cardiovascular: Positive for leg swelling.  Gastrointestinal: Positive for constipation.  Endocrine: Negative for cold intolerance.  Genitourinary: Positive for difficulty urinating.  Musculoskeletal: Positive for back pain, gait problem and joint swelling.  Skin: Negative for rash.  Allergic/Immunologic: Negative for food allergies.  Neurological: Positive for weakness and numbness.  Hematological: Does not bruise/bleed easily.  Psychiatric/Behavioral: Positive for sleep disturbance.     Objective: Vital Signs: BP (!) 190/72 (BP Location: Right Arm, Patient Position: Sitting, Cuff Size: Normal)   Pulse (!) 56   Resp 16   Ht 5\' 9"  (1.753 m)   Wt 182 lb (82.6 kg)   BMI 26.88 kg/m   Physical Exam Constitutional:      Appearance: He is well-developed.  Eyes:     Pupils: Pupils are equal, round, and reactive to light.  Pulmonary:     Effort: Pulmonary effort is normal.  Skin:    General: Skin is warm and dry.  Neurological:     Mental Status: He is alert and oriented to person, place, and time.  Psychiatric:        Behavior: Behavior normal.     Ortho Exam and oriented x3.  Comfortable sitting.  Does have a halting  gait related to his Parkinson's.  Uses a cane to assist with ambulation.  Left knee without effusion but palpable osteophytes medially associated with patellar crepitation.  Full extension flexed over 105 degrees without instability.  No calf pain.  No distal edema.  No hip pain.  Straight leg raise minimally positive for some low back discomfort.  Does have some percussible tenderness in the left paralumbar region but not in the flank  Specialty Comments:  No specialty comments available.  Imaging: No results found.   PMFS History: Patient Active Problem List   Diagnosis Date Noted  .  Unilateral primary osteoarthritis, left knee 04/01/2019  . Chronic left-sided low back pain with left-sided sciatica 04/01/2019   Past Medical History:  Diagnosis Date  . Diabetes mellitus without complication (HCC)   . Hypertension   . Parkinson's disease ([facility])     Family History  Problem Relation Age of Onset  . Diabetes Mother   . Stroke Father     Past Surgical History:  Procedure Laterality Date  . APPENDECTOMY    . back epidural   08/2017  . BACK SURGERY    . CORONARY ANGIOPLASTY WITH STENT PLACEMENT  08/19/2012  . KNEE SURGERY     Social History   Occupational History  . Occupation: Retired   Tobacco Use  . Smoking status: Former Games developermoker  . Smokeless tobacco: Never Used  Substance and Sexual Activity  . Alcohol use: Not Currently  . Drug use: No  . Sexual activity: Not on file

## 2019-04-09 ENCOUNTER — Other Ambulatory Visit: Payer: Self-pay

## 2019-04-09 MED ORDER — CARBIDOPA-LEVODOPA 25-100 MG PO TABS: 2.0000 | ORAL_TABLET | Freq: Three times a day (TID) | ORAL | 3 refills | Status: DC

## 2019-04-18 ENCOUNTER — Ambulatory Visit: Payer: Medicare HMO | Admitting: Orthopaedic Surgery

## 2019-05-03 ENCOUNTER — Other Ambulatory Visit: Payer: Self-pay

## 2019-05-03 ENCOUNTER — Ambulatory Visit
Admission: RE | Admit: 2019-05-03 | Discharge: 2019-05-03 | Disposition: A | Discharge status: Home / Self Care | Payer: Medicare HMO | Source: Ambulatory Visit | Attending: Orthopaedic Surgery | Admitting: Orthopaedic Surgery

## 2019-05-03 DIAGNOSIS — M5442 Lumbago with sciatica, left side: Secondary | ICD-10-CM

## 2019-05-03 DIAGNOSIS — G8929 Other chronic pain: Secondary | ICD-10-CM

## 2019-05-07 ENCOUNTER — Ambulatory Visit: Payer: Medicare HMO | Admitting: Orthopaedic Surgery

## 2019-05-07 ENCOUNTER — Other Ambulatory Visit: Payer: Self-pay

## 2019-05-07 ENCOUNTER — Encounter: Payer: Self-pay | Admitting: Orthopaedic Surgery

## 2019-05-07 VITALS — BP 172/85 | HR 56

## 2019-05-07 DIAGNOSIS — M5442 Lumbago with sciatica, left side: Secondary | ICD-10-CM | POA: Diagnosis not present

## 2019-05-07 DIAGNOSIS — G8929 Other chronic pain: Secondary | ICD-10-CM | POA: Diagnosis not present

## 2019-05-07 NOTE — Progress Notes (Signed)
Office Visit Note   Patient: Ronald [name]           Date of Birth: 01/03/1951           MRN: [id number] Visit Date: 05/07/2019              Requested by: Woodroe ChenJohns, [name], MD [address] [place],  [place] [id number] PCP: Woodroe ChenJohns, [name], MD   Assessment & Plan: Visit Diagnoses:  1. Chronic left-sided low back pain with left-sided sciatica     Plan: MRI scan demonstrated a prior decompression at L3.  Grade 1 anterolisthesis of L2 on 3.  Lumbar spondylosis most notable at L2-3 with a left paracentral lateral disc extrusion contacting and impinging the exiting left L1 nerve root there is also a 5 mm posterior right facet cyst causing compression of the thecal sac and severe bilateral neural foraminal narrowing and superior central canal stenosis at that same level.  There was also L3-S1 severe bilateral neural foraminal narrowing.  Endorses having back pain and bilateral lower extremity radicular pain more on the left than the right.  A lot of his pain is in the area of his left groin which would be consistent with an L1 nerve root.  Had prior films of his left hip that revealed little if any arthritic change and no acute changes.  Long discussion with [name] and his wife by phone regarding the above.  I would like them to call his spine physician and set up another appointment.  I will give him a copy of the MRI scan report.  Also will schedule an epidural steroid injection.  Office visit nearly 30 minutes 50% of the time in counseling would like to see him again in 3 to 4 weeks after he has had the epidural steroid injection or have him seen by a spine surgeon should that be sooner than 3 to 4 weeks  Follow-Up Instructions: Return in about 4 weeks (around 06/04/2019).   Orders:  Orders Placed This Encounter  Procedures  . Epidural Steroid Injection - Lumbar/Sacral (Ancillary Performed)   No orders of the defined types were placed in this encounter.     Procedures: No procedures  performed   Clinical Data: No additional findings.   Subjective: No chief complaint on file. [name] had an MRI scan of his lumbar spine is here for the results.  Still having considerable back and bilateral lower extremity pain with what appears to be claudication.  He continues to have more trouble on the left than the right.  He does have history of Parkinson's..  Had prior lumbar surgery through [facility] in June of last year presumably at L3-4  HPI  Review of Systems   Objective: Vital Signs: BP (!) 172/85 (BP Location: Right Arm, Patient Position: Sitting, Cuff Size: Normal)   Pulse (!) 56   Physical Exam Constitutional:      Appearance: He is well-developed.  Eyes:     Pupils: Pupils are equal, round, and reactive to light.  Pulmonary:     Effort: Pulmonary effort is normal.  Skin:    General: Skin is warm and dry.  Neurological:     Mental Status: He is alert and oriented to person, place, and time.  Psychiatric:        Behavior: Behavior normal.     Ortho Exam evaluated in a wheelchair.  Does have weakness in his lower extremities but is able to dorsiflex  and plantarflex both feet.  Notes that he  also has good feeling.  Does have some rigidity and stiffness based on his Parkinson's disease.  Straight leg raise is negative.  Does not have any midline percussible tenderness.  Painless range of motion of both hips and specifically the left  Specialty Comments:  No specialty comments available.  Imaging: No results found.   PMFS History: Patient Active Problem List   Diagnosis Date Noted  . Unilateral primary osteoarthritis, left knee 04/01/2019  . Chronic left-sided low back pain with left-sided sciatica 04/01/2019   Past Medical History:  Diagnosis Date  . Diabetes mellitus without complication ([place])   . Hypertension   . Parkinson's disease ([place])     Family History  Problem Relation Age of Onset  . Diabetes Mother   . Stroke Father      Past Surgical History:  Procedure Laterality Date  . APPENDECTOMY    . back epidural   08/2017  . BACK SURGERY    . CORONARY ANGIOPLASTY WITH STENT PLACEMENT  08/19/2012  . KNEE SURGERY     Social History   Occupational History  . Occupation: Retired   Tobacco Use  . Smoking status: Former Research scientist (life sciences)  . Smokeless tobacco: Never Used  Substance and Sexual Activity  . Alcohol use: Not Currently  . Drug use: No  . Sexual activity: Not on file     Garald Balding, MD   Note - This record has been created using [employer].  Chart creation errors have been sought, but may not always  have been located. Such creation errors do not reflect on  the standard of medical care.

## 2019-05-09 ENCOUNTER — Other Ambulatory Visit: Payer: Self-pay

## 2019-05-09 ENCOUNTER — Ambulatory Visit
Admission: RE | Admit: 2019-05-09 | Discharge: 2019-05-09 | Disposition: A | Discharge status: Home / Self Care | Payer: Medicare HMO | Source: Ambulatory Visit | Attending: Orthopaedic Surgery | Admitting: Orthopaedic Surgery

## 2019-05-09 DIAGNOSIS — G8929 Other chronic pain: Secondary | ICD-10-CM

## 2019-05-09 DIAGNOSIS — M5442 Lumbago with sciatica, left side: Secondary | ICD-10-CM

## 2019-05-09 MED ORDER — IOPAMIDOL (ISOVUE-M 200) INJECTION 41%
1.0000 mL | Freq: Once | INTRAMUSCULAR | Status: AC
  Administered 2019-05-09: 1 mL via EPIDURAL

## 2019-05-09 MED ORDER — METHYLPREDNISOLONE ACETATE 40 MG/ML INJ SUSP (RADIOLOG
120.0000 mg | Freq: Once | INTRAMUSCULAR | Status: AC
  Administered 2019-05-09: 120 mg via EPIDURAL

## 2019-05-09 NOTE — Discharge Instructions (Signed)

## 2019-05-16 ENCOUNTER — Ambulatory Visit: Payer: Medicare HMO | Admitting: Orthopaedic Surgery

## 2019-05-16 ENCOUNTER — Other Ambulatory Visit: Payer: Self-pay

## 2019-05-16 ENCOUNTER — Encounter: Payer: Self-pay | Admitting: Orthopaedic Surgery

## 2019-05-16 VITALS — BP 145/63 | HR 65 | Ht 69.0 in | Wt 182.0 lb

## 2019-05-16 DIAGNOSIS — G8929 Other chronic pain: Secondary | ICD-10-CM | POA: Diagnosis not present

## 2019-05-16 DIAGNOSIS — M5442 Lumbago with sciatica, left side: Secondary | ICD-10-CM | POA: Diagnosis not present

## 2019-05-16 MED ORDER — HYDROCODONE-ACETAMINOPHEN 5-325 MG PO TABS: 1.0000 | ORAL_TABLET | Freq: Two times a day (BID) | ORAL | 0 refills | Status: DC | PRN

## 2019-05-16 NOTE — Progress Notes (Signed)
Office Visit Note   Patient: Ronald [name]           Date of Birth: 04/25/1951           MRN: [id number] Visit Date: 05/16/2019              Requested by: Woodroe ChenJohns, [name], MD [address] [place],  [place] [id number] PCP: Woodroe ChenJohns, [name], MD   Assessment & Plan: Visit Diagnoses:  1. Chronic left-sided low back pain with left-sided sciatica     Plan: Ronald [name]  has had an MRI scan of his lumbar spine that was previously reviewed with him.  That demonstrated areas of significant foraminal and central stenosis.  1 week ago he had an epidural steroid injection is not sure it made much of a difference.  He does have an appointment to [facility] [place] [facility] [facility] next [place].  He is on baclofen for spasm which he should take at night as he is having trouble sleeping and I will add hydrocodone for pain.  He has a number of comorbidities that could be adding to his problem including his diabetes and his Parkinson's.  Hopefully the neurosurgeon will be able to help  Follow-Up Instructions: Return in about 1 month (around 06/16/2019).   Orders:  No orders of the defined types were placed in this encounter.  Meds ordered this encounter  Medications  . HYDROcodone-acetaminophen (NORCO/VICODIN) 5-325 MG tablet    Sig: Take 1-2 tablets by mouth 2 (two) times daily as needed for moderate pain.    Dispense:  30 tablet    Refill:  0      Procedures: No procedures performed   Clinical Data: No additional findings.   Subjective: Chief Complaint  Patient presents with  . Lower Back - Follow-up  Patient presents today for a follow up on his lower back pain. He is now one week out from [facility]. Patient states that the injection helped some, but still hurting.  He at night with spasm.  He does have baclofen.  His history is complicated that he does have diabetes and is taking Actos.  He also has Parkinson's and I am sure contributes to some of his  weakness and altered gait.  However, he does have significant foraminal and spinal stenosis on the MRI scan  HPI  Review of Systems   Objective: Vital Signs: BP (!) 145/63   Pulse 65   Ht 5\' 9"  (1.753 m)   Wt 182 lb (82.6 kg)   BMI 26.88 kg/m   Physical Exam Constitutional:      Appearance: He is well-developed.  Eyes:     Pupils: Pupils are equal, round, and reactive to light.  Pulmonary:     Effort: Pulmonary effort is normal.  Skin:    General: Skin is warm and dry.  Neurological:     Mental Status: He is alert and oriented to person, place, and time.  Psychiatric:        Behavior: Behavior normal.     Ortho Exam abnormal gait probably related to his Parkinson's.  He does use a cane.  He walks with flexed knees.  He is able to dorsiflex and plantarflex both of his feet although I thought he had some weakness.  He notes he has good sensibility.  No percussible back pain.  Straight leg raise is negative.  Specialty Comments:  No specialty comments available.  Imaging: No results found.   PMFS History: Patient Active  Problem List   Diagnosis Date Noted  . Unilateral primary osteoarthritis, left knee 04/01/2019  . Chronic left-sided low back pain with left-sided sciatica 04/01/2019   Past Medical History:  Diagnosis Date  . Diabetes mellitus without complication ([place])   . Hypertension   . Parkinson's disease ([place])     Family History  Problem Relation Age of Onset  . Diabetes Mother   . Stroke Father     Past Surgical History:  Procedure Laterality Date  . APPENDECTOMY    . back epidural   08/2017  . BACK SURGERY    . CORONARY ANGIOPLASTY WITH STENT PLACEMENT  08/19/2012  . KNEE SURGERY     Social History   Occupational History  . Occupation: Retired   Tobacco Use  . Smoking status: Former Research scientist (life sciences)  . Smokeless tobacco: Never Used  Substance and Sexual Activity  . Alcohol use: Not Currently  . Drug use: No  . Sexual activity: Not on file

## 2019-05-30 ENCOUNTER — Ambulatory Visit: Payer: Medicare HMO | Admitting: Neurology

## 2019-05-30 ENCOUNTER — Encounter: Payer: Self-pay | Admitting: Neurology

## 2019-05-30 ENCOUNTER — Other Ambulatory Visit: Payer: Self-pay

## 2019-05-30 VITALS — BP 170/87 | HR 58 | Ht 69.0 in | Wt 175.0 lb

## 2019-05-30 DIAGNOSIS — K5909 Other constipation: Secondary | ICD-10-CM | POA: Diagnosis not present

## 2019-05-30 DIAGNOSIS — M545 Low back pain: Secondary | ICD-10-CM | POA: Diagnosis not present

## 2019-05-30 DIAGNOSIS — G8929 Other chronic pain: Secondary | ICD-10-CM | POA: Diagnosis not present

## 2019-05-30 DIAGNOSIS — G20 Parkinson's disease: Secondary | ICD-10-CM

## 2019-05-30 NOTE — Progress Notes (Signed)
Subjective:    Patient ID: Ronald [name] is a 68 y.o. male.  HPI     Interim history:   Ronald [name] is a 68 yo Left-handed gentleman with an underlying medical history of diabetes, Bell's palsy some 10 years ago, overweight state, CAD with MI in 2013 and 3 stents placed at the time, and hypertension who presents for follow-up consultation of his Parkinson's disease, with right-sided predominance noted. The patient is unaccompanied today. I last saw him on 11/27/2018, at which time he was complaining of low back pain.  He had lumbar spine x-rays in November 2019 which showed some degenerative changes.  He had received a lumbar spine injection under Dr. Durward Fortes in December which he felt was not that helpful.  He felt that the back pain was enough to limit his exercise.  He had not pursued his hobby of making pens very much either.  He was advised to continue with Sinemet 2 pills 3 times daily.  He was advised to try gabapentin low dose with gradual increase to up to 100 mg 3 times daily.   Today, 05/30/2019: He reports ongoing issues with low back pain, he has had a lower back injection recently with limited success and is pending a neurosurgical evaluation through [facility] next week.  He has a prescription through Dr. Durward Fortes for hydrocodone but does not use it regularly, similarly he also has baclofen available and he has tried the gabapentin but has not found any great relief with it, does use all of his medications only if needed.  He is worried about the side effect profile of these medications, all of which list dizziness as a potential side effect.  As far as the Parkinson's regimen, he feels that the Sinemet is still effective 2 pills 3 times a day.  He has had more constipation.  He can go 2 days even without a bowel movement, generally every other day is his more consistent pattern.  He tries to hydrate well with water.  He has a treadmill but it is painful for him to walk particularly with  radiating pain to the left leg.  He has noticed a big setback when he was not able to go to the [facility] any longer, he would go to the pool for water exercises and he would use the stationary bike.  His brother has a stationary bike and a treadmill and he is wondering if he could borrow his brother stationary bike.  His middle daughter had a baby in April, his other daughter is a Catering manager for [employer] and has been traveling internationally.  Another daughter is an Forensic psychologist.  He is worried about his own health and his family.  Nevertheless, overall he is holding up.   The patient's allergies, current medications, family history, past medical history, past social history, past surgical history and problem list were reviewed and updated as appropriate.    Previously (copied from previous notes for reference):     I saw him on 05/29/2018, at which time he reported having done well after interim back surgery on 03/06/2018. He also had radiofrequency surgery prior to that. He reported occasional dizziness and nausea, some loss of appetite. I suggested we change his Sinemet from 2 pills 3 times a day to 1-1/2 pills 4 times a day. He reported falls. He was physically quite active, was going to the [facility]. His falls primarily occurred when he was turning.    11/02/2017, at which time he reported more back pain.  He had undergone an injection in December 2018 but had found no relief. He traveled [place]. He was active physically but had to take a break because of back pain. He was taking Mobic for back pain. He had a slow reduction and his hemoglobin and red cell counts and I counseled him on the daily use of meloxicam.     I saw him on 05/02/2017, at which time he was complaining of lower back pain. He had seen orthopedics for this. He also had right knee pain, status post injection. He had not fallen thankfully. He was taking Sinemet 2 pills 3 times a day.   I saw him on 10/31/2016, at which time he reported  doing okay, no recent new symptoms, in particular no significant memory loss or mood disorder constipation. He did have an episode of chest pain recently. He saw his cardiologist. He felt to be a little dehydrated and blood pressure was on the lower end of normal. He was noted to have some orthostatic hypotension. He was started on northera 100 mg tid, but had not filled it yet. He was trying to stay active and going to the [facility] 3 times a week. He was also interested in pursuing boxing classes for PD. He had lost a little bit of weight and reported some loss of appetite. I suggested he continue with Sinemet 2 pills 3 times a day. Advised to change positions slowly and be mindful of his blood pressure dropping potentially with sudden changes of position and he was reminded to stay well-hydrated.   I first met him on 04/27/2016 at the request of his primary care physician, at which time he reported a prior diagnosis of Parkinson's disease in 2014 and symptoms dating back to early 2014. He was exercising regularly, he was doing well on symptomatic treatment with Sinemet and I suggested he continue with the medication, at 2 pills 3 times a day. He was reminded to stay active physically and mentally and stable hydrated and well-nourished.    04/27/2016: He was diagnosed with Parkinson's disease in 2014. Symptoms date back to beginning of 2014, and started with speech changes, was noted to dragging his feet.  He has been on Sinemet for the past 3 years, with gradual increase, currently 2 pills in the morning, 2 in the afternoon and 1 at night, was asked to reduce it, He is not sure why, seems to tolerate it well and believes it has helped. He is wondering however if there is any new medication available. He has not been on a dopamine agonist before from what I understand, no Azilect, no other MAO B inhibitor either.  He exercises regularly and is enrolled in the PD spinning class at 2 different [employer]. Lives with his  wife of 7 years in [place], and they have 3 grown daughters, 2 GC. He is a nonsmoker, drinks alcohol infrequently, is a retired Geophysicist/field seismologist.  I reviewed your office note from 02/08/2016. He used to see Dr. Marijean Bravo, neurologist out of [facility] but Dr. Marijean Bravo retired. He then saw another neurologist in the same office but she left. Prior office records from his previous neurologists are not available for my review today. We will request office records from Dr. Barbera Setters office, patient signed a ROI form today and is agreeable.   He denies any major memory or mood issues or sleep issues, does snore some, but no apneas reported, no hx of RBD. No FHx of PD.  He varies his C/L dose, 1st  dose 6-9 AM, second dose around 12 or 1 PM and last dose around 8 PM.    He also plays golf, once or twice a week. He tries to stay well-hydrated, may not always drink enough water however.  His Past Medical History Is Significant For: Past Medical History:  Diagnosis Date  . Diabetes mellitus without complication ([place])   . Hypertension   . Parkinson's disease ([place])     His Past Surgical History Is Significant For: Past Surgical History:  Procedure Laterality Date  . APPENDECTOMY    . back epidural   08/2017  . BACK SURGERY    . CORONARY ANGIOPLASTY WITH STENT PLACEMENT  08/19/2012  . KNEE SURGERY      His Family History Is Significant For: Family History  Problem Relation Age of Onset  . Diabetes Mother   . Stroke Father     His Social History Is Significant For: Social History   Socioeconomic History  . Marital status: Married    Spouse name: Memory Dance   . Number of children: 3  . Years of education: Not on file  . Highest education level: Not on file  Occupational History  . Occupation: Retired   Scientific laboratory technician  . Financial resource strain: Not on file  . Food insecurity    Worry: Not on file    Inability: Not on file  . Transportation needs    Medical: Not on file    Non-medical: Not on file   Tobacco Use  . Smoking status: Former Research scientist (life sciences)  . Smokeless tobacco: Never Used  Substance and Sexual Activity  . Alcohol use: Not Currently  . Drug use: No  . Sexual activity: Not on file  Lifestyle  . Physical activity    Days per week: Not on file    Minutes per session: Not on file  . Stress: Not on file  Relationships  . Social Herbalist on phone: Not on file    Gets together: Not on file    Attends religious service: Not on file    Active member of club or organization: Not on file    Attends meetings of clubs or organizations: Not on file    Relationship status: Not on file  Other Topics Concern  . Not on file  Social History Narrative   Drinks about 1 cup of coffee a day     His Allergies Are:  No Known Allergies:   His Current Medications Are:  Outpatient Encounter Medications as of 05/30/2019  Medication Sig  . aspirin 81 MG chewable tablet Chew by mouth.  . baclofen (LIORESAL) 10 MG tablet 2 (two) times daily.   . carbidopa-levodopa (SINEMET IR) 25-100 MG tablet Take 2 tablets by mouth 3 (three) times daily.  Marland Kitchen gabapentin (NEURONTIN) 100 MG capsule Take 1 capsule (100 mg total) by mouth 3 (three) times daily.  Marland Kitchen HYDROcodone-acetaminophen (NORCO/VICODIN) 5-325 MG tablet Take 1-2 tablets by mouth 2 (two) times daily as needed for moderate pain.  Marland Kitchen lisinopril (ZESTRIL) 10 MG tablet Take by mouth.  . meloxicam (MOBIC) 15 MG tablet TAKE 1 TABLET BY MOUTH ONCE DAILY  . metoprolol succinate (TOPROL-XL) 25 MG 24 hr tablet Take by mouth.  . naproxen sodium (ALEVE) 220 MG tablet Take 220 mg by mouth as needed.  Marland Kitchen oxybutynin (DITROPAN) 5 MG tablet TAKE ONE TABLET BY MOUTH TWICE DAILY NEEDS  APPT  . pioglitazone (ACTOS) 15 MG tablet daily.  . sertraline (ZOLOFT) 50 MG tablet Take  50 mg by mouth at bedtime.  . simvastatin (ZOCOR) 40 MG tablet TAKE ONE TABLET BY MOUTH IN THE EVENING  . [DISCONTINUED] lisinopril (PRINIVIL,ZESTRIL) 5 MG tablet Take 1 tablet daily.   . [DISCONTINUED] meloxicam (MOBIC) 7.5 MG tablet    No facility-administered encounter medications on file as of 05/30/2019.   :  Review of Systems:  Out of a complete 14 point review of systems, all are reviewed and negative with the exception of these symptoms as listed below: Review of Systems  Neurological:       Pt presents today to discuss her PD. Pt reports that he has back pain.    Objective:  Neurological Exam  Physical Exam Physical Examination:   Vitals:   05/30/19 1308  BP: (!) 170/87  Pulse: (!) 58   General Examination: The patient is a very pleasant 68 y.o. male in no acute distress. He appears well-developed and well-nourished and well groomed.   HEENT:Normocephalic, atraumatic, pupils are equal, round and reactive to light and accommodation. Extraocular tracking is slightly impaired. His face is slightly asymmetric with stable left facial weakness noted, less twitching today. He has moderate facial masking. He has moderate hypophonia, maybe slight dysarthria at times. Neck is mildly rigid, stable. Airway examination revealed mild to moderate mouth dryness, adequate dental hygiene, no other changes.   Chest:Clear to auscultation without wheezing, rhonchi or crackles noted.  Heart:S1+S2+0, regular and normal without murmurs, rubs or gallops noted.   Abdomen:Soft, non-tender and non-distended.  Extremities:There isno edema in both lower extremities today.   Skin: Warm and dry without trophic changes noted.  Musculoskeletal: exam reveals no obvious joint deformities, tenderness or joint swelling or erythema. Reports tenderness in the lateral left thigh.  Neurologically:  Mental status: The patient is awake, alert and oriented in all 4 spheres. Hisimmediate and remote memory, attention, language skills and fund of knowledge are appropriate. There is no evidence of aphasia, agnosia, apraxia or anomia. Thought process is linear. Mood is normaland  affect is normal.  Cranial nerves II - XII are as described above under HEENT exam. Motor exam: Normal bulk, strength for age, noresting tremortoday, except right hand intermittent tremor, slight dyskinesia of the right foot, intermittent. He has moderate difficulty with fine motor skills on the right including finger taps, hand movements and rapid alternating patting, foot taps and foot agility as well. On the left, he hasoverallmilderfindings.Reflexes are 1+. Sensory exam intact to light touch throughout. Overall moderate bradykinesia. He stands without Mild difficulty, stands up with slightly wider stance, he has a more stooped posture and right tilt with the upper body, he walks slowly and cautiously, has a single-point cane, mild shuffling noted, decreased arm swing bilaterally.  He turns slowly and cautiously.The more he walks, the more he seems to have a left leg limp.   Assessment and Plan:   In summary, Mister Krahenbuhl a very pleasant 68 year oldleft-handed gentleman with an underlying medical history of coronary artery disease, status post stent placement, history of left-sided Bell's palsy, overweight state,degenerative back d/s, with s/p ESI, arthritis with s/p R knee injection,hypertension, type 2 diabetes,and status post lumbar spine surgery in June 2019,who presents for follow-up consultation of his right-sided predominant Parkinson's disease, with Some progression noted.  He has had ongoing issues with low back pain which is a big setback for him, in addition, he has not been able to seek any formal exercise but also has been hurting more with walking.  This is a big dilemma for  him.  He has had some more issues with constipation as well.  This is probably secondary to a combination of factors, occasionally using a narcotic pain medication, less activity physically is also a contributor most likely.  He is advised to continue with Sinemet 2 pills 3 times a day.  He is  furthermore advised to be very proactive about constipation and add Metamucil or Benefiber or senna to his regimen, try to get a Reasonable bowel movement every other day.  He is advised to consider using stationary bike rather than his treadmill.  He reports he could borrow his brother's.  He has had some dizziness, we mutually agreed to not change his medication regimen, he can continue to try gabapentin if needed.  Unfortunately, a lot of his medications can also cause dizziness as a potential side effect.  He is scheduled to see a neurosurgeon in [place] next week.  He is encouraged to keep [place] posted as to the outcome of the appointment.  I plan to see him back in about 4 months, sooner if needed.  I answered all his questions today and he was in agreement. I spent 25 minutes in total face-to-face time with the patient, more than 50% of which was spent in counseling and coordination of care, reviewing test results, reviewing medication and discussing or reviewing the diagnosis of PD, its prognosis and treatment options. Pertinent laboratory and imaging test results that were available during this visit with the patient were reviewed by me and considered in my medical decision making (see chart for details).

## 2019-05-30 NOTE — Patient Instructions (Signed)
Hang in there, having significant back pain is certainly a big setback for you.  Try to exercise within what ever limitations you have, may be a recumbent bike borrowed from your brother is a good idea.  I do not believe the treadmill is a good idea for you currently. Please try to hydrate well with water and be very proactive about constipation issues, I really want you to work towards having a reasonable bowel movement at least every other day, utilize Metamucil, or Benefiber, or probiotic in terms of yogurt or oral capsules, and of course MiraLAX or Dulcolax as needed. Lets keep your Sinemet the same, 2 pills 3 times a day.  You can continue to utilize gabapentin 100 mg strength as needed if you find it beneficial for your back pain and leg pain.  Keep [place] posted regarding your appointment with the neurosurgeon through [facility] next week. Follow up in 4 months.

## 2019-09-30 ENCOUNTER — Encounter: Payer: Self-pay | Admitting: Neurology

## 2019-09-30 ENCOUNTER — Other Ambulatory Visit: Payer: Self-pay

## 2019-09-30 ENCOUNTER — Ambulatory Visit: Payer: Medicare HMO | Admitting: Neurology

## 2019-09-30 VITALS — BP 155/76 | HR 60 | Temp 98.0°F | Ht 69.0 in | Wt 175.0 lb

## 2019-09-30 DIAGNOSIS — M545 Low back pain: Secondary | ICD-10-CM | POA: Diagnosis not present

## 2019-09-30 DIAGNOSIS — G20A1 Parkinson's disease without dyskinesia, without mention of fluctuations: Secondary | ICD-10-CM

## 2019-09-30 DIAGNOSIS — G20 Parkinson's disease: Secondary | ICD-10-CM | POA: Diagnosis not present

## 2019-09-30 DIAGNOSIS — M25562 Pain in left knee: Secondary | ICD-10-CM | POA: Diagnosis not present

## 2019-09-30 DIAGNOSIS — K5909 Other constipation: Secondary | ICD-10-CM

## 2019-09-30 DIAGNOSIS — G8929 Other chronic pain: Secondary | ICD-10-CM

## 2019-09-30 NOTE — Patient Instructions (Signed)
Please try to increase your Sinemet to 2 pills 4 times a day, at 9, 1 PM, 5 PM and 9 PM.  You just filled your normal prescription and have enough to try to increase for now, I will not change your prescription yet.  Please call us in a couple of weeks for an update as to how you are doing on the current regimen and the increase in Sinemet.  Please use your cane at all times, Consider starting to use a walker for gait safety.  Fall prevention is going to be so critical.  Please try to hydrate better with water, 6 to 8 cups/day are recommended. Please try to exercise with the help of your stationary bike.  I would not recommend you try to use your treadmill.

## 2019-09-30 NOTE — Progress Notes (Signed)
Subjective:    [name] ID: Ronald [name] is a 69 y.o. male.  HPI     Interim history:   Ronald [name] is a 69 year old left-handed gentleman with an underlying medical history of diabetes, Bell's palsy some 10 years ago, overweight state, CAD with MI in 2013 and 3 stents placed at Ronald time, and hypertension who presents for follow-up consultation of his Parkinson's disease, with right-sided predominance noted. Ronald [name] is unaccompanied today. I last saw Ronald [name] on 05/30/2019, at which time Ronald [name] reported ongoing problems with his low back pain.  Ronald [name] had tried medication including narcotic pain medication, muscle relaxer and gabapentin.  Ronald [name] was supposed to have a neurosurgical evaluation soon.  Ronald [name] had problems with constipation.  Ronald [name] was still taking Sinemet 2 pills 3 times daily with reasonably good results.  Ronald [name] also reported overall feeling more stressed.  Ronald [name] was advised to be more proactive about constipation issues and try to be more active physically with Ronald help of a recumbent stationary bike if possible.  Today, 09/30/2019: Ronald [name] reports that Ronald [name] is slower, his balance is worse, Ronald [name] has fallen 2 or 3 times, twice because his left knee gave out.  Ronald [name] has arthritis and has seen Dr. Durward Fortes for this, is status post cortisone injection some 2 or 3 months ago.  Ronald [name] also saw his neurosurgeon out of [place] in September 2020.  Ronald [name] is status post lumbar L2-3 decompression surgery in June 2019.  Ronald [name] has ongoing problems with low back pain.  His neurosurgeon discussed Ronald possibility of more extensive low back surgery but explained to Ronald [name] that Ronald [name] would have a much longer recuperation time.  Ronald [name] has been using his stationary bike.  Ronald [name] has not used his treadmill.  Ronald [name] uses a single-point cane on Ronald left.  Ronald [name] has intermittent constipation, uses a stool softener and laxative as needed.  Ronald [name] admits that Ronald [name] may not hydrate well enough with water, estimates that Ronald [name] drinks about 4 to 5 cups of water per day.  Ronald [name] takes Sinemet  2 pills 3 times daily at 9 AM, 12 and 4 or 5 PM.  Ronald [name] still has intermittent dizziness.  Ronald [name] is worried that his medication is contributing to dizziness.  Ronald [name] has had some lower extremity swelling. On a positive note, his daughter, who is a flight attendant, is expecting her first baby in Ronald next coming weeks.  Ronald [name] is very excited.  His daughter has a place in [place] which she is able to rent out currently and she is going to take some time off, Ronald [name] is very excited to have a new addition to Ronald family soon.   Ronald [name]'s allergies, current medications, family history, past medical history, past social history, past surgical history and problem list were reviewed and updated as appropriate.    Previously (copied from previous notes for reference):        I saw Ronald [name] on 11/27/2018, at which time Ronald [name] was complaining of low back pain.  Ronald [name] had lumbar spine x-rays in November 2019 which showed some degenerative changes.  Ronald [name] had received a lumbar spine injection under Dr. Durward Fortes in December which Ronald [name] felt was not that helpful.  Ronald [name] felt that Ronald back pain was enough to limit his exercise.  Ronald [name] had not pursued his hobby of making pens very much either.  Ronald [name] was advised to continue with Sinemet 2 pills 3 times daily.  Ronald [name] was advised to try gabapentin low dose with gradual increase to up to  100 mg 3 times daily.    I saw Ronald [name] on 05/29/2018, at which time Ronald [name] reported having done well after interim back surgery on 03/06/2018. Ronald [name] also had radiofrequency surgery prior to that. Ronald [name] reported occasional dizziness and nausea, some loss of appetite. I suggested we change his Sinemet from 2 pills 3 times a day to 1-1/2 pills 4 times a day. Ronald [name] reported falls. Ronald [name] was physically quite active, was going to [facility]. His falls primarily occurred when Ronald [name] was turning.    11/02/2017, at which time Ronald [name] reported more back pain. Ronald [name] had undergone an injection in December 2018 but had found no relief. Ronald [name] traveled [place]. Ronald [name] was active  physically but had to take a break because of back pain. Ronald [name] was taking Mobic for back pain. Ronald [name] had a slow reduction and his hemoglobin and red cell counts and I counseled Ronald [name] on Ronald daily use of meloxicam.     I saw Ronald [name] on 05/02/2017, at which time Ronald [name] was complaining of lower back pain. Ronald [name] had seen orthopedics for this. Ronald [name] also had right knee pain, status post injection. Ronald [name] had not fallen thankfully. Ronald [name] was taking Sinemet 2 pills 3 times a day.   I saw Ronald [name] on 10/31/2016, at which time Ronald [name] reported doing okay, no recent new symptoms, in particular no significant memory loss or mood disorder constipation. Ronald [name] did have an episode of chest pain recently. Ronald [name] saw his cardiologist. Ronald [name] felt to be a little dehydrated and blood pressure was on Ronald lower end of normal. Ronald [name] was noted to have some orthostatic hypotension. Ronald [name] was started on northera 100 mg tid, but had not filled it yet. Ronald [name] was trying to stay active and going to Ronald [facility] 3 times a week. Ronald [name] was also interested in pursuing boxing classes for PD. Ronald [name] had lost a little bit of weight and reported some loss of appetite. I suggested Ronald [name] continue with Sinemet 2 pills 3 times a day. Advised to change positions slowly and be mindful of his blood pressure dropping potentially with sudden changes of position and Ronald [name] was reminded to stay well-hydrated.   I first met Ronald [name] on 04/27/2016 at Ronald request of his primary care physician, at which time Ronald [name] reported a prior diagnosis of Parkinson's disease in 2014 and symptoms dating back to early 2014. Ronald [name] was exercising regularly, Ronald [name] was doing well on symptomatic treatment with Sinemet and I suggested Ronald [name] continue with Ronald medication, at 2 pills 3 times a day. Ronald [name] was reminded to stay active physically and mentally and stable hydrated and well-nourished.    04/27/2016: Ronald [name] was diagnosed with Parkinson's disease in 2014. Symptoms date back to beginning of 2014, and started with speech changes, was noted to dragging his feet.  Ronald [name] has  been on Sinemet for Ronald past 3 years, with gradual increase, currently 2 pills in Ronald morning, 2 in Ronald afternoon and 1 at night, was asked to reduce it, Ronald [name] is not sure why, seems to tolerate it well and believes it has helped. Ronald [name] is wondering however if there is any new medication available. Ronald [name] has not been on a dopamine agonist before from what I understand, no Azilect, no other MAO B inhibitor either.  Ronald [name] exercises regularly and is enrolled in Ronald PD spinning class at 2 different [employer]. Lives with his wife of 24 years in [place], and they have 3 grown daughters, 2 GC. Ronald [name] is a nonsmoker, drinks alcohol infrequently, is a retired Geophysicist/field seismologist.  I reviewed  your office note from 02/08/2016. Ronald [name] used to see Dr. Marijean Bravo, neurologist out of [facility] but Dr. Marijean Bravo retired. Ronald [name] then saw another neurologist in Ronald same office but she left. Prior office records from his previous neurologists are not available for my review today. We will request office records from Dr. Barbera Setters office, [name] signed a ROI form today and is agreeable.   Ronald [name] denies any major memory or mood issues or sleep issues, does snore some, but no apneas reported, no hx of RBD. No FHx of PD.  Ronald [name] varies his C/L dose, 1st dose 6-9 AM, second dose around 12 or 1 PM and last dose around 8 PM.    Ronald [name] also plays golf, once or twice a week. Ronald [name] tries to stay well-hydrated, may not always drink enough water however.  His Past Medical History Is Significant For: Past Medical History:  Diagnosis Date  . Diabetes mellitus without complication ([place])   . Hypertension   . Parkinson's disease ([place])     His Past Surgical History Is Significant For: Past Surgical History:  Procedure Laterality Date  . APPENDECTOMY    . back epidural   08/2017  . BACK SURGERY    . CORONARY ANGIOPLASTY WITH STENT PLACEMENT  08/19/2012  . KNEE SURGERY      His Family History Is Significant For: Family History  Problem Relation Age of Onset  . Diabetes Mother   .  Stroke Father     His Social History Is Significant For: Social History   Socioeconomic History  . Marital status: Married    Spouse name: Memory Dance   . Number of children: 3  . Years of education: Not on file  . Highest education level: Not on file  Occupational History  . Occupation: Retired   Tobacco Use  . Smoking status: Former Research scientist (life sciences)  . Smokeless tobacco: Never Used  Substance and Sexual Activity  . Alcohol use: Not Currently  . Drug use: No  . Sexual activity: Not on file  Other Topics Concern  . Not on file  Social History Narrative   Drinks about 1 cup of coffee a day    Social Determinants of Health   Financial Resource Strain:   . Difficulty of Paying Living Expenses: Not on file  Food Insecurity:   . Worried About Charity fundraiser in Ronald Last Year: Not on file  . Ran Out of Food in Ronald Last Year: Not on file  Transportation Needs:   . Lack of Transportation (Medical): Not on file  . Lack of Transportation (Non-Medical): Not on file  Physical Activity:   . Days of Exercise per Week: Not on file  . Minutes of Exercise per Session: Not on file  Stress:   . Feeling of Stress : Not on file  Social Connections:   . Frequency of Communication with Friends and Family: Not on file  . Frequency of Social Gatherings with Friends and Family: Not on file  . Attends Religious Services: Not on file  . Active Member of Clubs or Organizations: Not on file  . Attends Archivist Meetings: Not on file  . Marital Status: Not on file    His Allergies Are:  No Known Allergies:   His Current Medications Are:  Outpatient Encounter Medications as of 09/30/2019  Medication Sig  . aspirin 81 MG chewable tablet Chew by mouth.  . carbidopa-levodopa (SINEMET IR) 25-100 MG tablet Take 2 tablets by mouth 3 (three) times daily.  Marland Kitchen HYDROcodone-acetaminophen (  NORCO/VICODIN) 5-325 MG tablet Take 1-2 tablets by mouth 2 (two) times daily as needed for moderate pain.  Marland Kitchen  lisinopril (ZESTRIL) 10 MG tablet Take by mouth.  . metoprolol succinate (TOPROL-XL) 25 MG 24 hr tablet Take by mouth.  . naproxen sodium (ALEVE) 220 MG tablet Take 220 mg by mouth as needed.  Marland Kitchen oxybutynin (DITROPAN) 5 MG tablet TAKE ONE TABLET BY MOUTH TWICE DAILY NEEDS  APPT  . pioglitazone (ACTOS) 15 MG tablet daily.  . sertraline (ZOLOFT) 50 MG tablet Take 50 mg by mouth at bedtime.  . simvastatin (ZOCOR) 40 MG tablet TAKE ONE TABLET BY MOUTH IN Ronald EVENING  . gabapentin (NEURONTIN) 100 MG capsule Take 1 capsule (100 mg total) by mouth 3 (three) times daily. ([name] not taking: Reported on 09/30/2019)  . meloxicam (MOBIC) 15 MG tablet TAKE 1 TABLET BY MOUTH ONCE DAILY  . [DISCONTINUED] baclofen (LIORESAL) 10 MG tablet 2 (two) times daily.    No facility-administered encounter medications on file as of 09/30/2019.  :  Review of Systems:  Out of a complete 14 point review of systems, all are reviewed and negative with Ronald exception of these symptoms as listed below: Review of Systems  Neurological:       4 month f/u on Parkinson's Disease- Pt sts Ronald [name] has been moving slower since his last visit. Reports 2-3 falls since last visit, no serious injuries.     Objective:  Neurological Exam  Physical Exam Physical Examination:   Vitals:   09/30/19 1302  BP: (!) 155/76  Pulse: 60  Temp: 98 F (36.7 C)    General Examination: Ronald [name] is a very pleasant 69 y.o. male in no acute distress. Ronald [name] appears well-developed and well-nourished and well groomed. Ronald [name] is a little more frail and deconditioned appearing today.   HEENT:Normocephalic, atraumatic, pupils are equal, round and reactive to light, extraocular tracking is slightly impaired. His face is slightly asymmetric with stable left facial weakness noted. Ronald [name] has moderate facial masking. Ronald [name] has moderate hypophonia, maybe slight dysarthria at times.Neck is moderately rigid, slightly worse. Airway examination revealed mild to moderate  mouth dryness, adequate dental hygiene, no other changes.   Chest:Clear to auscultation without wheezing, rhonchi or crackles noted.  Heart:S1+S2+0, regular and normal without murmurs, rubs or gallops noted.   Abdomen:Soft, non-tender and non-distended.  Extremities:There is traceedema in both lower extremitiestoday.  Skin: Warm and dry without trophic changes noted.  Musculoskeletal: exam reveals L knee pain and LBP.  Neurologically:  Mental status: Ronald [name] is awake, alert and oriented in all 4 spheres. Hisimmediate and remote memory, attention, language skills and fund of knowledge are appropriate. There is no evidence of aphasia, agnosia, apraxia or anomia. Thought process is linear. Mood is normaland affect is normal.  Cranial nerves II - XII are as described above under HEENT exam. Motor exam: Normal bulk, strength for age, noresting tremortoday, except right hand intermittent tremor, mild dyskinesia of Ronald right foot, intermittent. Ronald [name] has moderate difficulty with fine motor skills on Ronald right including finger taps, hand movements and rapid alternating patting, foot taps and foot agility as well. On Ronald left, Ronald [name] hasoverallmilderfindings. Sensory exam intact to light touch throughout. Overall moderate bradykinesia. Ronald [name] stands without Mild difficulty, stands up with slightly wider stance, Ronald [name] has a stooped posture and right tilt with Ronald upper body, Ronald [name] walks slowly and cautiously, has a single-point cane, mild shuffling noted, decreased arm swing, cane on Ronald L.  Ronald [name] turns slowly and cautiously.  Assessment and Plan:   In summary, Karla Vines a very pleasant 52 year oldleft-handed gentleman with an underlying medical history of coronary artery disease, status post stent placement, history of left-sided Bell's palsy, overweight state,degenerative back d/s, with s/p ESI, arthritis withs/p R knee injection,hypertension, type 2 diabetes,and status post lumbar  spine surgery in June 2019,who presents for follow-up consultation of his right-sided predominant Parkinson's disease, with progression noted.  Ronald [name] has had ongoing issues with low back pain and Left knee pain, reports that Ronald left knee gave out twice in Ronald recent past. Ronald [name] has fallen 2 or 3 times in Ronald past 3 or 4 months. Ronald [name] has ongoing issues with intermittent constipation.  Ronald [name] is advised to stay better hydrated with Water and use his cane at all times, consider starting to use a walker actually.Low back surgery was discussed with Ronald [name] in September when Ronald [name] saw his neurosurgeon out of [place].Ronald [name] had a steroid injection into Ronald left knee some 2 to 3 months ago without telltale effect.Ronald [name] is advised to consider increasing his Sinemet to 2 pills 4 times a day, on a 4 hourly basis, starting at 9 AM.  Ronald [name] is worried somewhat about side effects including dizziness and swelling.  Ronald [name] is advised to try at least 1 week with Ronald increased dose and call us back in Ronald next week or 2 for an update, we can consider increasing Ronald prescription at Ronald time if needed.  We talked about utilizing a dopamine agonist.  Ronald [name] is advised that there is Ronald possibility of side effects including dizziness and swelling and sleepiness from taking a dopamine agonist.  We mutually agreed to increase his Sinemet at this time.  Ronald [name] is advised to give [place] a call and I plan to follow Ronald [name] In Ronald next 3 to 4 months routinely, sooner if needed. We talked about Ronald importance of fall prevention.  Ronald [name] is encouraged to continue to use his stationary bike, avoid Ronald treadmill. I answered all his questions today and Ronald [name] was in agreement. I spent 25 minutes in total face-to-face time with Ronald [name], more than 50% of which was spent in counseling and coordination of care, reviewing test results, reviewing medication and discussing or reviewing Ronald diagnosis of PD, its prognosis and treatment options. Pertinent laboratory and imaging test results that were  available during this visit with Ronald [name] were reviewed by me and considered in my medical decision making (see chart for details).

## 2019-11-14 DIAGNOSIS — D509 Iron deficiency anemia, unspecified: Secondary | ICD-10-CM | POA: Insufficient documentation

## 2020-01-28 ENCOUNTER — Other Ambulatory Visit: Payer: Self-pay

## 2020-01-28 ENCOUNTER — Encounter: Payer: Self-pay | Admitting: Neurology

## 2020-01-28 ENCOUNTER — Ambulatory Visit: Payer: Medicare HMO | Admitting: Neurology

## 2020-01-28 VITALS — BP 124/80 | HR 55 | Temp 98.0°F | Ht 69.0 in | Wt 174.0 lb

## 2020-01-28 DIAGNOSIS — G20 Parkinson's disease: Secondary | ICD-10-CM

## 2020-01-28 DIAGNOSIS — M25562 Pain in left knee: Secondary | ICD-10-CM

## 2020-01-28 DIAGNOSIS — M545 Low back pain, unspecified: Secondary | ICD-10-CM

## 2020-01-28 DIAGNOSIS — G8929 Other chronic pain: Secondary | ICD-10-CM | POA: Diagnosis not present

## 2020-01-28 MED ORDER — CARBIDOPA-LEVODOPA 25-100 MG PO TABS: 2.0000 | ORAL_TABLET | Freq: Three times a day (TID) | ORAL | 3 refills | Status: DC

## 2020-01-28 NOTE — Patient Instructions (Signed)
As discussed, we will continue with your Sinemet 2 pills 3 times a day.  Unfortunately, you had side effects when you went up to 2 pills 4 times a day.  Please use your cane at all times for gait safety.  Please monitor your weight and your appetite, it is important that you eat enough and nutritious food, enough protein in your diet as well.  Please try to stay active and proactive about constipation issues.  Please follow-up routinely in 6 months, sooner if needed, call us if you have any interim questions or concerns.

## 2020-01-28 NOTE — Progress Notes (Signed)
Subjective:    Patient ID: Ronald [name] is a 69 y.o. male.  HPI     Interim history:   Ronald [name] is a 69 year old left-handed gentleman with an underlying medical history of diabetes, Bell's palsy some 10 years ago, overweight state, CAD with MI in 2013 and 3 stents placed at the time, and hypertension who presents for follow-up consultation of his Parkinson's disease, with right-sided predominance noted. The patient is unaccompanied today. I last saw him on 09/30/2019, at which time he reported that his balance was worse and he had 2 falls because his left knee gave out.  He had arthritis issues with the knees and is seen orthopedics.  He had seen neurosurgery in [place] in September 2020.  He felt that overall his slowness was worse.  He was advised to increase his Sinemet to 2 pills 4 times daily from tid.   Today, 01/28/2020: He reports feeling about the same.  When you increase the Sinemet to 2 pills 4 times a day he felt more side effects, particularly felt more dizzy, after about a week he went back to taking 2 pills 3 times a day.  He has had a couple of falls, thankfully without injuries, typically when he turns too quickly or if the left leg gives out.  He often ends up using the cane but not always.  He forgot to bring his cane today.  He has lost a little bit of weight, reports loss of appetite and the food does not taste as good.  He tries to stay active but has not played golf and several months.  He has a new [place], his daughter and the baby are staying with them since she is on leave.  He has had his Covid shots.  He tries to hydrate well, constipation is currently not a major issue, takes a laxative if needed.   The patient's allergies, current medications, family history, past medical history, past social history, past surgical history and problem list were reviewed and updated as appropriate.    Previously (copied from previous notes for reference):    I saw him on  05/30/2019, at which time he reported ongoing problems with his low back pain.  He had tried medication including narcotic pain medication, muscle relaxer and gabapentin.  He was supposed to have a neurosurgical evaluation soon.  He had problems with constipation.  He was still taking Sinemet 2 pills 3 times daily with reasonably good results.  He also reported overall feeling more stressed.  He was advised to be more proactive about constipation issues and try to be more active physically with the help of a recumbent stationary bike if possible.   I saw him on 11/27/2018, at which time he was complaining of low back pain.  He had lumbar spine x-rays in November 2019 which showed some degenerative changes.  He had received a lumbar spine injection under Dr. Durward Fortes in December which he felt was not that helpful.  He felt that the back pain was enough to limit his exercise.  He had not pursued his hobby of making pens very much either.  He was advised to continue with Sinemet 2 pills 3 times daily.  He was advised to try gabapentin low dose with gradual increase to up to 100 mg 3 times daily.    I saw him on 05/29/2018, at which time he reported having done well after interim back surgery on 03/06/2018. He also had radiofrequency surgery prior to that.  He reported occasional dizziness and nausea, some loss of appetite. I suggested we change his Sinemet from 2 pills 3 times a day to 1-1/2 pills 4 times a day. He reported falls. He was physically quite active, was going to the [facility]. His falls primarily occurred when he was turning.    11/02/2017, at which time he reported more back pain. He had undergone an injection in December 2018 but had found no relief. [place]. He was active physically but had to take a break because of back pain. He was taking Mobic for back pain. He had a slow reduction and his hemoglobin and red cell counts and I counseled him on the daily use of meloxicam.     I saw him  on 05/02/2017, at which time he was complaining of lower back pain. He had seen orthopedics for this. He also had right knee pain, status post injection. He had not fallen thankfully. He was taking Sinemet 2 pills 3 times a day.   I saw him on 10/31/2016, at which time he reported doing okay, no recent new symptoms, in particular no significant Ronald loss or mood disorder constipation. He did have an episode of chest pain recently. He saw his cardiologist. He felt to be a little dehydrated and blood pressure was on the lower end of normal. He was noted to have some orthostatic hypotension. He was started on northera 100 mg tid, but had not filled it yet. He was trying to stay active and going to the [facility] 3 times a week. He was also interested in pursuing boxing classes for PD. He had lost a little bit of weight and reported some loss of appetite. I suggested he continue with Sinemet 2 pills 3 times a day. Advised to change positions slowly and be mindful of his blood pressure dropping potentially with sudden changes of position and he was reminded to stay well-hydrated.   I first met him on 04/27/2016 at the request of his primary care physician, at which time he reported a prior diagnosis of Parkinson's disease in 2014 and symptoms dating back to early 2014. He was exercising regularly, he was doing well on symptomatic treatment with Sinemet and I suggested he continue with the medication, at 2 pills 3 times a day. He was reminded to stay active physically and mentally and stable hydrated and well-nourished.    04/27/2016: He was diagnosed with Parkinson's disease in 2014. Symptoms date back to beginning of 2014, and started with speech changes, was noted to dragging his feet.  He has been on Sinemet for the past 3 years, with gradual increase, currently 2 pills in the morning, 2 in the afternoon and 1 at night, was asked to reduce it, He is not sure why, seems to tolerate it well and believes it has  helped. He is wondering however if there is any new medication available. He has not been on a dopamine agonist before from what I understand, no Azilect, no other MAO B inhibitor either.  He exercises regularly and is enrolled in the PD spinning class at 2 different [employer]. Lives with his wife of 53 years in [place], and they have 3 grown daughters, 2 GC. He is a nonsmoker, drinks alcohol infrequently, is a retired Geophysicist/field seismologist.  I reviewed your office note from 02/08/2016. He used to see Dr. Marijean Bravo, neurologist out of [facility] but Dr. Marijean Bravo retired. He then saw another neurologist in the same office but she left. Prior office records from  his previous neurologists are not available for my review today. We will request office records from Dr. Barbera Setters office, patient signed a ROI form today and is agreeable.   He denies any major Ronald or mood issues or sleep issues, does snore some, but no apneas reported, no hx of RBD. No FHx of PD.  He varies his C/L dose, 1st dose 6-9 AM, second dose around 12 or 1 PM and last dose around 8 PM.    He also plays golf, once or twice a week. He tries to stay well-hydrated, may not always drink enough water however.  His Past Medical History Is Significant For: Past Medical History:  Diagnosis Date  . Diabetes mellitus without complication ([place])   . Hypertension   . Parkinson's disease ([place])     His Past Surgical History Is Significant For: Past Surgical History:  Procedure Laterality Date  . APPENDECTOMY    . back epidural   08/2017  . BACK SURGERY    . CORONARY ANGIOPLASTY WITH STENT PLACEMENT  08/19/2012  . KNEE SURGERY      His Family History Is Significant For: Family History  Problem Relation Age of Onset  . Diabetes Mother   . Stroke Father     His Social History Is Significant For: Social History   Socioeconomic History  . Marital status: Married    Spouse name: Ronald [name]   . Number of children: 3  . Years of education: Not on file  .  Highest education level: Not on file  Occupational History  . Occupation: Retired   Tobacco Use  . Smoking status: Former Research scientist (life sciences)  . Smokeless tobacco: Never Used  Substance and Sexual Activity  . Alcohol use: Not Currently  . Drug use: No  . Sexual activity: Not on file  Other Topics Concern  . Not on file  Social History Narrative   Drinks about 1 cup of coffee a day    Social Determinants of Health   Financial Resource Strain:   . Difficulty of Paying Living Expenses:   Food Insecurity:   . Worried About Charity fundraiser in the Last Year:   . Arboriculturist in the Last Year:   Transportation Needs:   . Film/video editor (Medical):   Marland Kitchen Lack of Transportation (Non-Medical):   Physical Activity:   . Days of Exercise per Week:   . Minutes of Exercise per Session:   Stress:   . Feeling of Stress :   Social Connections:   . Frequency of Communication with Friends and Family:   . Frequency of Social Gatherings with Friends and Family:   . Attends Religious Services:   . Active Member of Clubs or Organizations:   . Attends Archivist Meetings:   Marland Kitchen Marital Status:     His Allergies Are:  No Known Allergies:   His Current Medications Are:  Outpatient Encounter Medications as of 01/28/2020  Medication Sig  . aspirin 81 MG chewable tablet Chew by mouth.  . carbidopa-levodopa (SINEMET IR) 25-100 MG tablet Take 2 tablets by mouth 3 (three) times daily.  Marland Kitchen gabapentin (NEURONTIN) 100 MG capsule Take 1 capsule (100 mg total) by mouth 3 (three) times daily.  Marland Kitchen HYDROcodone-acetaminophen (NORCO/VICODIN) 5-325 MG tablet Take 1-2 tablets by mouth 2 (two) times daily as needed for moderate pain.  Marland Kitchen lisinopril (ZESTRIL) 10 MG tablet Take by mouth.  . meloxicam (MOBIC) 15 MG tablet TAKE 1 TABLET BY MOUTH ONCE DAILY  .  metoprolol succinate (TOPROL-XL) 25 MG 24 hr tablet Take by mouth.  . naproxen sodium (ALEVE) 220 MG tablet Take 220 mg by mouth as needed.  Marland Kitchen oxybutynin  (DITROPAN) 5 MG tablet TAKE ONE TABLET BY MOUTH TWICE DAILY NEEDS  APPT  . pioglitazone (ACTOS) 15 MG tablet daily.  . sertraline (ZOLOFT) 50 MG tablet Take 50 mg by mouth at bedtime.  . simvastatin (ZOCOR) 40 MG tablet TAKE ONE TABLET BY MOUTH IN THE EVENING   No facility-administered encounter medications on file as of 01/28/2020.  :  Review of Systems:  Out of a complete 14 point review of systems, all are reviewed and negative with the exception of these symptoms as listed below: Review of Systems  Neurological:       Here for 4 month f/u. Reports he has been doing ok. 2-3 falls since last visit but no serious injuries. Pt reports medications have been working well for him, no side effects reported.     Objective:  Neurological Exam  Physical Exam Physical Examination:   Vitals:   01/28/20 1301  BP: 124/80  Pulse: (!) 55  Temp: 98 F (36.7 C)  SpO2: 98%    General Examination: The patient is a very pleasant 69 y.o. male in no acute distress. He appears well-developed and well-nourished and well groomed.   HEENT:Normocephalic, atraumatic, pupils are equal, round and reactive to light, extraocular tracking is slightly impaired. His face is slightly asymmetric with stable left facial weakness noted. He has moderate facial masking. He has moderate hypophonia, slight dysarthria at times.Neck is moderately rigid, slightly worse. Airway examination revealed mild mouth dryness, adequate dental hygiene, no other changes.   Chest:Clear to auscultation without wheezing, rhonchi or crackles noted.  Heart:S1+S2+0, regular and normal without murmurs, rubs or gallops noted.   Abdomen:Soft, non-tender and non-distended.  Extremities:There is traceedema in both lower extremitiestoday.  Skin: Warm and dry without trophic changes noted.  Musculoskeletal: exam reveals L knee pain and LBP, not new.  Neurologically:  Mental status: The patient is awake, alert and oriented  in all 4 spheres. Hisimmediate and remote Ronald, attention, language skills and fund of knowledge are appropriate. There is no evidence of aphasia, agnosia, apraxia or anomia. Thought process is linear. Mood is normaland affect is normal.  Cranial nerves II - XII are as described above under HEENT exam. Motor exam: Normal bulk, strength for age, noresting tremortoday, except right hand intermittent tremor, no dyskinesia noted today. He has moderate difficulty with fine motor skills on the right including finger taps, hand movements and rapid alternating patting, foot taps and foot agility as well. On the left, he hasoverallmilderfindings. Sensory exam intact to light touch throughout. Overall moderatebradykinesia. He stands withoutMild difficulty, stands up with slightly wider stance, he has a stooped posture and right tilt with the upper body, he walks slowly and cautiously, no cane today. Decreased arm swing, turns slowly and cautiously.  Assessment and Plan:   In summary, Ronald [name] a very pleasant 22 year oldleft-handed gentleman with an underlying medical history of coronary artery disease, status post stent placement, history of left-sided Bell's palsy, overweight state,degenerative back d/s, with s/p ESI, arthritis withs/p R knee injection,hypertension, type 2 diabetes,and status post lumbar spine surgery in June 2019,who presents for follow-up consultation of his right-sided predominant Parkinson's disease, complicated by a low back pain, knee pain, constipation and falls.  He has had ongoing issues with low back pain and left knee. He has fallen 2 or 3 times in  the past 3 or 4 months, without major injuries, thankfully.  He is advised to use his cane consistently. He has ongoing issues with intermittent constipation.  He is advised to stay well hydrated with Water and use his cane at all times.he increased his Sinemet to 2 pills 4 times daily for about a week after the last  appointment but could not tolerated so is back to taking 2 pills 3 times daily.  He is advised to monitor his weight and appetite, try to eat nutritious food and enough protein.  He is advised to follow-up routinely in 6 months, sooner if needed.  He is advised to try to stay active mentally and physically.  He has typically done a good job with this.  I answered all his questions today and he was in agreement with the plan.   I spent 20 minutes in total face-to-face time and in reviewing records during pre-charting, more than 50% of which was spent in counseling and coordination of care, reviewing test results, reviewing medications and treatment regimen and/or in discussing or reviewing the diagnosis of PD, the prognosis and treatment options. Pertinent laboratory and imaging test results that were available during this visit with the patient were reviewed by me and considered in my medical decision making (see chart for details).

## 2020-04-01 DIAGNOSIS — F320 Major depressive disorder, single episode, mild: Secondary | ICD-10-CM | POA: Insufficient documentation

## 2020-04-26 IMAGING — XA Imaging study
2 series · 2 of 2 positions shown · non-contrast
Comparison: none

CLINICAL DATA: Lumbosacral spondylosis without myelopathy with
radiculopathy. Chronic severe low back pain radiating into the left
greater than right legs. Severe spinal canal stenosis at L2-L3 with
evidence of prior decompression. Severe bilateral neuroforaminal
stenosis from L2-L3 through L5-S1. Good relief from prior epidural
injections at outside facility.

[Series 1: ortho standard · 1 of 1 slices shown (1 of 2)]
[im 1/1]
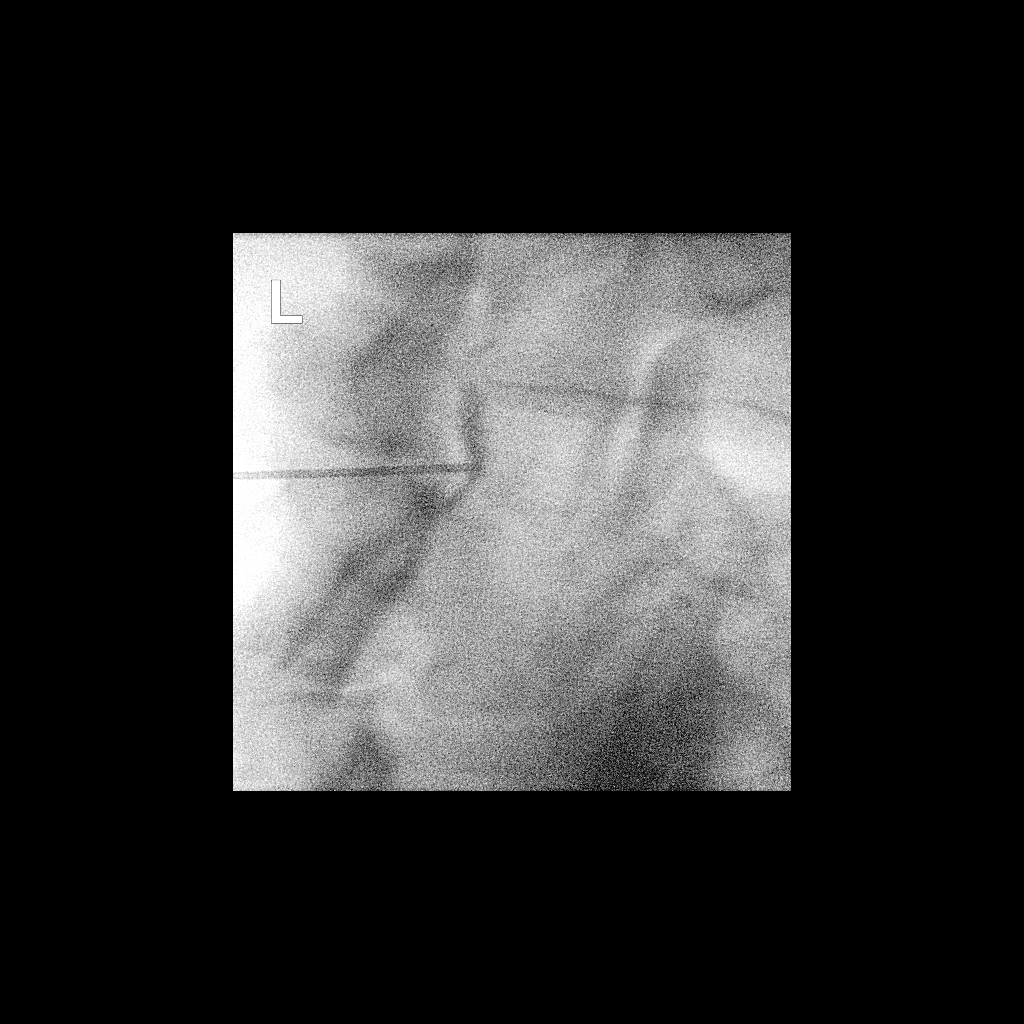

[Series 2: ortho standard · 1 of 1 slices shown (2 of 2)]
[im 1/1]
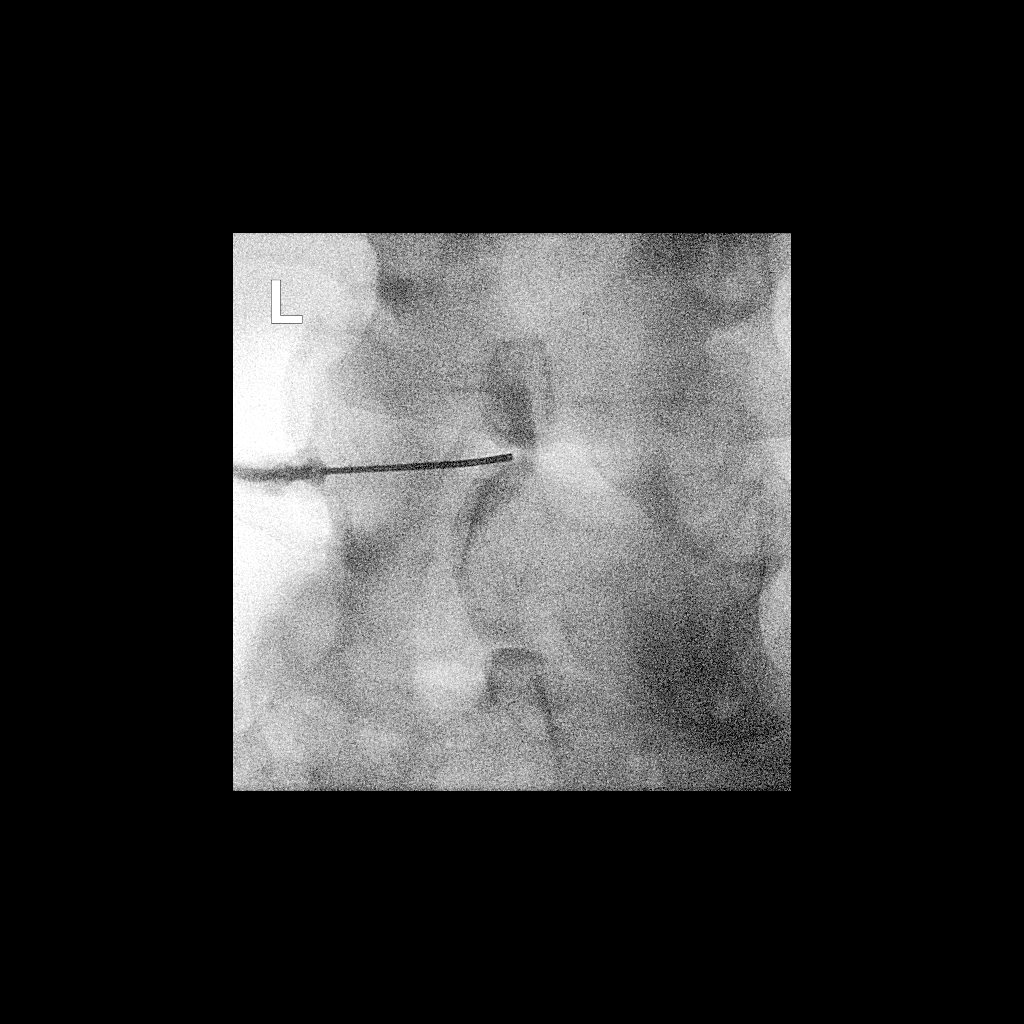

[2 of 2 positions shown; findings below may reference images not displayed]

FLUOROSCOPY TIME:  Radiation Exposure Index (as provided by the
fluoroscopic device): 1.9 mGy

Fluoroscopy Time:  4 seconds

Number of Acquired Images:  0

PROCEDURE:
The procedure, risks, benefits, and alternatives were explained to
the patient. Questions regarding the procedure were encouraged and
answered. The patient understands and consents to the procedure.

LUMBAR EPIDURAL INJECTION:

An interlaminar approach was performed on the left at L3-L4. The
overlying skin was cleansed and anesthetized. A 3.5 inch 20 gauge
epidural needle was advanced using loss-of-resistance technique.

DIAGNOSTIC EPIDURAL INJECTION:

Injection of Isovue-M 200 shows a good epidural pattern with spread
above and below the level of needle placement, primarily on the the
left. No vascular opacification is seen.

THERAPEUTIC EPIDURAL INJECTION:

120 mg of Depo-Medrol mixed with 3 mL of 1% lidocaine were
instilled. The procedure was well-tolerated, and the patient was
discharged thirty minutes following the injection in good condition.

COMPLICATIONS:
None immediate.
IMPRESSION: Technically successful interlaminar epidural injection on the left
at L3-L4.

## 2020-04-29 ENCOUNTER — Ambulatory Visit: Payer: Medicare HMO | Admitting: Cardiology

## 2020-05-12 ENCOUNTER — Ambulatory Visit: Payer: Medicare HMO | Admitting: Cardiovascular Disease

## 2020-06-04 ENCOUNTER — Other Ambulatory Visit: Payer: Self-pay

## 2020-06-04 ENCOUNTER — Ambulatory Visit: Payer: Medicare HMO | Admitting: Cardiovascular Disease

## 2020-06-04 ENCOUNTER — Encounter: Payer: Self-pay | Admitting: Cardiovascular Disease

## 2020-06-04 VITALS — BP 122/78 | HR 58 | Ht 69.0 in | Wt 182.0 lb

## 2020-06-04 DIAGNOSIS — R011 Cardiac murmur, unspecified: Secondary | ICD-10-CM

## 2020-06-04 DIAGNOSIS — G20 Parkinson's disease: Secondary | ICD-10-CM

## 2020-06-04 DIAGNOSIS — G20A1 Parkinson's disease without dyskinesia, without mention of fluctuations: Secondary | ICD-10-CM

## 2020-06-04 DIAGNOSIS — I10 Essential (primary) hypertension: Secondary | ICD-10-CM

## 2020-06-04 DIAGNOSIS — E7800 Pure hypercholesterolemia, unspecified: Secondary | ICD-10-CM | POA: Diagnosis not present

## 2020-06-04 DIAGNOSIS — R600 Localized edema: Secondary | ICD-10-CM | POA: Diagnosis not present

## 2020-06-04 DIAGNOSIS — I2510 Atherosclerotic heart disease of native coronary artery without angina pectoris: Secondary | ICD-10-CM

## 2020-06-04 DIAGNOSIS — E119 Type 2 diabetes mellitus without complications: Secondary | ICD-10-CM

## 2020-06-04 HISTORY — DX: Atherosclerotic heart disease of native coronary artery without angina pectoris: I25.10

## 2020-06-04 HISTORY — DX: Pure hypercholesterolemia, unspecified: E78.00

## 2020-06-04 HISTORY — DX: Parkinson's disease without dyskinesia, without mention of fluctuations: G20.A1

## 2020-06-04 HISTORY — DX: Type 2 diabetes mellitus without complications: E11.9

## 2020-06-04 HISTORY — DX: Essential (primary) hypertension: I10

## 2020-06-04 HISTORY — DX: Parkinson's disease: G20

## 2020-06-04 NOTE — Patient Instructions (Signed)
Medication Instructions:  HOLD YOUR SIMVASTATIN FOR 2 WEEKS AND CALL [phone]) WITH UPDATE ON YOUR ACHES  *If you need a refill on your cardiac medications before your next appointment, please call your pharmacy*  Lab Work: NONE  Testing/Procedures: Your physician has requested that you have an echocardiogram. Echocardiography is a painless test that uses sound waves to create images of your heart. It provides your doctor with information about the size and shape of your heart and how well your heart's chambers and valves are working. This procedure takes approximately one hour. There are no restrictions for this procedure. [facility] AT [address]   Follow-Up: At [facility], you and your health needs are our priority.  As part of our continuing mission to provide you with exceptional heart care, we have created designated Provider Care Teams.  These Care Teams include your primary Cardiologist (physician) and Advanced Practice Providers (APPs -  Physician Assistants and Nurse Practitioners) who all work together to provide you with the care you need, when you need it.  We recommend signing up for the patient portal called "MyChart".  Sign up information is provided on this After Visit Summary.  MyChart is used to connect with patients for Virtual Visits (Telemedicine).  Patients are able to view lab/test results, encounter notes, upcoming appointments, etc.  Non-urgent messages can be sent to your provider as well.   To learn more about what you can do with MyChart, go to [email or web address].    Your next appointment:   12 month(s) You will receive a reminder letter in the mail two months in advance. If you don't receive a letter, please call our office to schedule the follow-up appointment.  The format for your next appointment:   In Person  Provider:   [facility] or one of the following Advanced Practice Providers on your designated Care Team:     Corine Shelter, PA-C  [place], [place]  Edd Fabian, [place]

## 2020-06-04 NOTE — Progress Notes (Signed)
Cardiology Office Note   Date:  06/04/2020   ID:  Ronald [name], DOB [birth year], MRN [id number]  PCP:  Woodroe Chen, MD  Cardiologist:   Chilton Si, MD   No chief complaint on file.    History of Present Illness: Ronald [name] is a 69 y.o. male with diabetes, hypertension, hyperlipidemia, Parkinson's diseases and depression who is here to establish care at the request of Woodroe Chen, MD.  Mr. Yurko had an MI in 2013.  He had three stents placed at that time in the RCA and OM.  He has done well and has no angina.  He was receiving care at [facility] and elected to transfer.  He had a [employer] 02/2018 that revealed mild posterolateral ischemia and LVEF 65%.  He struggles with chronic back pain that limits his ability to exercise.  He liked golf but is no longer able to do so.  He had surgery on his back but still has weakness and pain.  He does try to get some exercise by walking.  He has no exertional chest pain or shortness of breath.  He also denies claudication.  He does note some muscle cramping in his thighs and pain in his left knee.  He has chronic lower extremity edema without orthopnea or PND.  He tries to elevate his legs when sitting but does not use compression socks.  He quit smoking in the early 1990s after smoking less than 1 pack/week for 20 years.  He likes to travel to [place] often but has not been able to do so lately due to COVID-19.  He had a long career as a Sports coach.   Past Medical History:  Diagnosis Date  . CAD in native artery 06/04/2020   RCA infarct 2013.  S/p RCA and OM PCI  . Diabetes mellitus type 2 in nonobese (HCC) 06/04/2020  . Diabetes mellitus without complication (HCC)   . Essential hypertension 06/04/2020  . Hypertension   . Parkinson's disease (HCC)   . Parkinson's disease (HCC) 06/04/2020  . Pure hypercholesterolemia 06/04/2020    Past Surgical History:  Procedure Laterality  Date  . APPENDECTOMY    . back epidural   08/2017  . BACK SURGERY    . CORONARY ANGIOPLASTY WITH STENT PLACEMENT  08/19/2012  . KNEE SURGERY       Current Outpatient Medications  Medication Sig Dispense Refill  . aspirin 81 MG chewable tablet Chew by mouth.    . carbidopa-levodopa (SINEMET IR) 25-100 MG tablet Take 2 tablets by mouth 3 (three) times daily. 540 tablet 3  . lisinopril (ZESTRIL) 10 MG tablet Take by mouth.    . metoprolol succinate (TOPROL-XL) 25 MG 24 hr tablet Take by mouth.    . oxybutynin (DITROPAN) 5 MG tablet TAKE ONE TABLET BY MOUTH TWICE DAILY NEEDS  APPT    . pioglitazone (ACTOS) 15 MG tablet daily.    . sertraline (ZOLOFT) 50 MG tablet Take 50 mg by mouth at bedtime.    . simvastatin (ZOCOR) 40 MG tablet TAKE ONE TABLET BY MOUTH IN THE EVENING     No current facility-administered medications for this visit.    Allergies:   Patient has no known allergies.    Social History:  The patient  reports that he has quit smoking. He has never used smokeless tobacco. He reports previous alcohol use. He reports that he does not use drugs.   Family History:  The patient's family history includes  Diabetes in his mother; Sarcoidosis in his mother; Stroke in his father.    ROS:  Please see the history of present illness.   Otherwise, review of systems are positive for none.   All other systems are reviewed and negative.    PHYSICAL EXAM: VS:  BP 122/78   Pulse (!) 58   Ht 5\' 9"  (1.753 m)   Wt 182 lb (82.6 kg)   SpO2 99%   BMI 26.88 kg/m  , BMI Body mass index is 26.88 kg/m. GENERAL:  Well appearing HEENT:  Pupils equal round and reactive, fundi not visualized, oral mucosa unremarkable NECK:  No jugular venous distention, waveform within normal limits, carotid upstroke brisk and symmetric, no bruits, no thyromegaly LYMPHATICS:  No cervical adenopathy LUNGS:  Clear to auscultation bilaterally HEART:  RRR.  PMI not displaced or sustained,S1 and S2 within normal  limits, no S3, no S4, no clicks, no rubs, II/VI early-peaking systolic murmur at the LUSB ABD:  Flat, positive bowel sounds normal in frequency in pitch, no bruits, no rebound, no guarding, no midline pulsatile mass, no hepatomegaly, no splenomegaly EXT:  2 plus pulses throughout, 2+ LE edema to the lower tibia, no cyanosis no clubbing SKIN:  No rashes no nodules NEURO:  Cranial nerves II through XII grossly intact, motor grossly intact throughout PSYCH:  Cognitively intact, oriented to person place and time   EKG:  EKG is ordered today. The ekg ordered today demonstrates sinus bradycardia.  Rate 58 bpm.  Prior inferior infarct.  LVH.   Recent Labs: No results found for requested labs within last 8760 hours.   05/06/2020: Sodium 139, potassium 5.1, BUN 23, creatinine 1.17 AST 19, ALT 10  03/25/2020: Total cholesterol 140, triglycerides 55, HDL 60, LDL 68 TSH 1.7  Lipid Panel No results found for: CHOL, TRIG, HDL, CHOLHDL, VLDL, LDLCALC, LDLDIRECT    Wt Readings from Last 3 Encounters:  06/04/20 182 lb (82.6 kg)  01/28/20 174 lb (78.9 kg)  09/30/19 175 lb (79.4 kg)      ASSESSMENT AND PLAN:  # CAD s/p MI:  # Hyperlipidemia:  Prior PCI of the RCA and OM.  Stable.  He did have a Lexiscan Myoview in 2019 that had evidence of ischemia.  Clinically he has no angina.  Continue secondary prevention with aspirin, metoprolol, and simvastatin.  We will hold the simvastatin for 2 weeks given his report of muscle cramps to see if this helps.  If it does, we will likely switch simvastatin to rosuvastatin.  # Essential hypertension:  BP controlled today.  He reports that it has been elevated at home at times.  Continue to monitor for now.  Continue lisinopril and metoprolol.   # Murmur:  # LE edema: Sounds like aortic valve sclerosis versus mild aortic stenosis.  We will get an echo, especially given his lower extremity edema.  Recommended elevation and compression socks.   Current  medicines are reviewed at length with the patient today.  The patient does not have concerns regarding medicines.  The following changes have been made:  no change  Labs/ tests ordered today include:   Orders Placed This Encounter  Procedures  . EKG 12-Lead  . ECHOCARDIOGRAM COMPLETE     Disposition:   FU with  C. 2020, MD, [facility] in 1 year     Signed,  C. [facility], MD, [facility]  06/04/2020 11:23 AM    [facility]

## 2020-06-18 ENCOUNTER — Telehealth: Payer: Self-pay | Admitting: Cardiovascular Disease

## 2020-06-18 DIAGNOSIS — I2510 Atherosclerotic heart disease of native coronary artery without angina pectoris: Secondary | ICD-10-CM

## 2020-06-18 DIAGNOSIS — E7800 Pure hypercholesterolemia, unspecified: Secondary | ICD-10-CM

## 2020-06-18 NOTE — Telephone Encounter (Signed)
Patient called to report that he has been off of simvastatin (ZOCOR) 40 MG tablet for two weeks as advised per Dr. Duke Salvia and his ankles are not swollen anymore.  He wants to know if Dr. Duke Salvia is going to switch his medication.

## 2020-06-22 NOTE — Telephone Encounter (Signed)
Left message to call back  

## 2020-06-22 NOTE — Telephone Encounter (Signed)
Let's try rosuvastatin 20mg .  Repeat lipids/CMP in 3 months.  Let me know if the discomfort recurs.

## 2020-06-26 MED ORDER — ROSUVASTATIN CALCIUM 20 MG PO TABS: 20.0000 mg | ORAL_TABLET | Freq: Every day | ORAL | 3 refills | Status: DC

## 2020-06-26 NOTE — Telephone Encounter (Signed)
Spoke to patient Dr.Comfort's recommendation given.Rosuvastatin prescription sent to pharmacy.Advised to repeat fasting cmet,lipid panel in 3 months.Lab orders mailed.

## 2020-06-26 NOTE — Telephone Encounter (Signed)
Patient returning call.

## 2020-06-29 ENCOUNTER — Other Ambulatory Visit: Payer: Self-pay

## 2020-06-29 ENCOUNTER — Ambulatory Visit (HOSPITAL_COMMUNITY): Payer: Medicare HMO | Attending: Cardiology

## 2020-06-29 DIAGNOSIS — R011 Cardiac murmur, unspecified: Secondary | ICD-10-CM | POA: Diagnosis not present

## 2020-06-29 DIAGNOSIS — R600 Localized edema: Secondary | ICD-10-CM | POA: Insufficient documentation

## 2020-06-29 LAB — ECHOCARDIOGRAM COMPLETE
Area-P 1/2: 2.39 cm2
P 1/2 time: 990 msec
S' Lateral: 3.2 cm

## 2020-07-30 ENCOUNTER — Ambulatory Visit: Payer: Medicare HMO | Admitting: Neurology

## 2020-07-30 ENCOUNTER — Encounter: Payer: Self-pay | Admitting: Neurology

## 2020-07-30 VITALS — BP 143/76 | HR 53 | Ht 69.0 in | Wt 178.5 lb

## 2020-07-30 DIAGNOSIS — G20 Parkinson's disease: Secondary | ICD-10-CM

## 2020-07-30 NOTE — Patient Instructions (Signed)
It was good to see you again today.  Your exam is stable, thankfully.  Your prescription for Sinemet is up-to-date, you can continue to take 2 pills 3 times daily.  Please follow-up routinely in 6 months, sooner if needed.  Please call us with any interim questions or concerns that may arise.

## 2020-07-30 NOTE — Progress Notes (Signed)
Subjective:    Patient ID: Ronald [name] is a 69 y.o. male.  HPI     Interim history:   Ronald [name] is a 69 year old left-handed gentleman with an underlying medical history of diabetes, Bell's palsy some 10 years ago, overweight state, CAD with MI in 2013 and 3 stents placed at the time, and hypertension who presents for follow-up consultation of his Parkinson's disease, with right-sided predominance noted. The patient is unaccompanied today and presents for his 63-monthcheckup.  I last saw him on 01/28/2020, at which time he reported ongoing issues with intermittent constipation.  He had fallen a couple of times but thankfully without any major injuries.  We talked about the importance of fall prevention and constipation control.  He was not able to tolerate Sinemet at 2 pills 4 times daily and had scaled back to 2 pills 3 times daily.  He was advised to continue with this. He was advised to use his cane consistently.  Today, 07/30/2020: He reports feeling fairly stable overall, no recent falls.  Uses a cane.  He has been tolerating Sinemet 2 pills 3 times daily.  He has no significant issues with constipation, appetite and weight are stable, he tries to hydrate well with water and tries to exercise on a regular basis, [facility]  He has had occasional coughing when drinking water.  He does not feel the need to get a formal evaluation of his swallowing and declines a referral to speech therapy at this time, we can revisit next time.  He had noticed some swelling around the ankles.  His cardiologist switched him from Zocor to Crestor.  He seems to tolerate this well.  The patient's allergies, current medications, family history, past medical history, past social history, past surgical history and problem list were reviewed and updated as appropriate.    Previously (copied from previous notes for reference):   I saw him on 09/30/2019, at which time he reported  that his balance was worse and he had 2 falls because his left knee gave out.  He had arthritis issues with the knees and is seen orthopedics.  [place]  He felt that overall his slowness was worse.  He was advised to increase his Sinemet to 2 pills 4 times daily from tid.      I saw him on 05/30/2019, at which time he reported ongoing problems with his low back pain.  He had tried medication including narcotic pain medication, muscle relaxer and gabapentin.  He was supposed to have a neurosurgical evaluation soon.  He had problems with constipation.  He was still taking Sinemet 2 pills 3 times daily with reasonably good results.  He also reported overall feeling more stressed.  He was advised to be more proactive about constipation issues and try to be more active physically with the help of a recumbent stationary bike if possible.   I saw him on 11/27/2018, at which time he was complaining of low back pain.  He had lumbar spine x-rays in November 2019 which showed some degenerative changes.  He had received a lumbar spine injection under Dr. WDurward Fortesin December which he felt was not that helpful.  He felt that the back pain was enough to limit his exercise.  He had not pursued his hobby of making pens very much either.  He was advised to continue with Sinemet 2 pills 3 times  daily.  He was advised to try gabapentin low dose with gradual increase to up to 100 mg 3 times daily.    I saw him on 05/29/2018, at which time he reported having done well after interim back surgery on 03/06/2018. He also had radiofrequency surgery prior to that. He reported occasional dizziness and nausea, some loss of appetite. I suggested we change his Sinemet from 2 pills 3 times a day to 1-1/2 pills 4 times a day. He reported falls. He was physically quite active, was going to the [facility]. His falls primarily occurred when he was turning.    11/02/2017, at which time he reported  more back pain. He had undergone an injection in December 2018 but had found no relief. [place]. He was active physically but had to take a break because of back pain. He was taking Mobic for back pain. He had a slow reduction and his hemoglobin and red cell counts and I counseled him on the daily use of meloxicam.     I saw him on 05/02/2017, at which time he was complaining of lower back pain. He had seen orthopedics for this. He also had right knee pain, status post injection. He had not fallen thankfully. He was taking Sinemet 2 pills 3 times a day.   I saw him on 10/31/2016, at which time he reported doing okay, no recent new symptoms, in particular no significant memory loss or mood disorder constipation. He did have an episode of chest pain recently. He saw his cardiologist. He felt to be a little dehydrated and blood pressure was on the lower end of normal. He was noted to have some orthostatic hypotension. He was started on northera 100 mg tid, but had not filled it yet. He was trying to stay active and going to the [facility] 3 times a week. He was also interested in pursuing boxing classes for PD. He had lost a little bit of weight and reported some loss of appetite. I suggested he continue with Sinemet 2 pills 3 times a day. Advised to change positions slowly and be mindful of his blood pressure dropping potentially with sudden changes of position and he was reminded to stay well-hydrated.   I first met him on 04/27/2016 at the request of his primary care physician, at which time he reported a prior diagnosis of Parkinson's disease in 2014 and symptoms dating back to early 2014. He was exercising regularly, he was doing well on symptomatic treatment with Sinemet and I suggested he continue with the medication, at 2 pills 3 times a day. He was reminded to stay active physically and mentally and stable hydrated and well-nourished.    04/27/2016: He was diagnosed with Parkinson's disease in  2014. Symptoms date back to beginning of 2014, and started with speech changes, was noted to dragging his feet.  He has been on Sinemet for the past 3 years, with gradual increase, currently 2 pills in the morning, 2 in the afternoon and 1 at night, was asked to reduce it, He is not sure why, seems to tolerate it well and believes it has helped. He is wondering however if there is any new medication available. He has not been on a dopamine agonist before from what I understand, no Azilect, no other MAO B inhibitor either.  He exercises regularly and is enrolled in the PD spinning class at 2 different [employer]. Lives with his wife of 29 years in [place], and they have 3 grown daughters,  2 GC. He is a nonsmoker, drinks alcohol infrequently, is a retired Geophysicist/field seismologist.  I reviewed your office note from 02/08/2016. He used to see Dr. Marijean Bravo, neurologist out of [facility] but Dr. Marijean Bravo retired. He then saw another neurologist in the same office but she left. Prior office records from his previous neurologists are not available for my review today. We will request office records from Dr. Barbera Setters office, patient signed a ROI form today and is agreeable.   He denies any major memory or mood issues or sleep issues, does snore some, but no apneas reported, no hx of RBD. No FHx of PD.  He varies his C/L dose, 1st dose 6-9 AM, second dose around 12 or 1 PM and last dose around 8 PM.    He also plays golf, once or twice a week. He tries to stay well-hydrated, may not always drink enough water however.  His Past Medical History Is Significant For: Past Medical History:  Diagnosis Date  . CAD in native artery 06/04/2020   RCA infarct 2013.  S/p RCA and OM PCI  . Diabetes mellitus type 2 in nonobese ([place]) 06/04/2020  . Diabetes mellitus without complication ([place])   . Essential hypertension 06/04/2020  . Hypertension   . Parkinson's disease ([place])   . Parkinson's disease ([place]) 06/04/2020  . Pure hypercholesterolemia  06/04/2020    His Past Surgical History Is Significant For: Past Surgical History:  Procedure Laterality Date  . APPENDECTOMY    . back epidural   08/2017  . BACK SURGERY    . CORONARY ANGIOPLASTY WITH STENT PLACEMENT  08/19/2012  . KNEE SURGERY      His Family History Is Significant For: Family History  Problem Relation Age of Onset  . Diabetes Mother   . Sarcoidosis Mother   . Stroke Father     His Social History Is Significant For: Social History   Socioeconomic History  . Marital status: Married    Spouse name: Memory Dance   . Number of children: 3  . Years of education: Not on file  . Highest education level: Not on file  Occupational History  . Occupation: Retired   Tobacco Use  . Smoking status: Former Research scientist (life sciences)  . Smokeless tobacco: Never Used  Vaping Use  . Vaping Use: Never used  Substance and Sexual Activity  . Alcohol use: Not Currently  . Drug use: No  . Sexual activity: Not on file  Other Topics Concern  . Not on file  Social History Narrative   Drinks about 1 cup of coffee a day    Social Determinants of Health   Financial Resource Strain:   . Difficulty of Paying Living Expenses: Not on file  Food Insecurity:   . Worried About Charity fundraiser in the Last Year: Not on file  . Ran Out of Food in the Last Year: Not on file  Transportation Needs:   . Lack of Transportation (Medical): Not on file  . Lack of Transportation (Non-Medical): Not on file  Physical Activity:   . Days of Exercise per Week: Not on file  . Minutes of Exercise per Session: Not on file  Stress:   . Feeling of Stress : Not on file  Social Connections:   . Frequency of Communication with Friends and Family: Not on file  . Frequency of Social Gatherings with Friends and Family: Not on file  . Attends Religious Services: Not on file  . Active Member of Clubs or Organizations: Not on  file  . Attends Archivist Meetings: Not on file  . Marital Status: Not on file     His Allergies Are:  No Known Allergies:   His Current Medications Are:  Outpatient Encounter Medications as of 07/30/2020  Medication Sig  . aspirin 81 MG chewable tablet Chew by mouth.  . carbidopa-levodopa (SINEMET IR) 25-100 MG tablet Take 2 tablets by mouth 3 (three) times daily.  Marland Kitchen lisinopril (ZESTRIL) 10 MG tablet Take by mouth.  . metoprolol succinate (TOPROL-XL) 25 MG 24 hr tablet Take by mouth.  . oxybutynin (DITROPAN) 5 MG tablet TAKE ONE TABLET BY MOUTH TWICE DAILY NEEDS  APPT  . pioglitazone (ACTOS) 15 MG tablet daily.  . rosuvastatin (CRESTOR) 20 MG tablet Take 1 tablet (20 mg total) by mouth daily.  . sertraline (ZOLOFT) 50 MG tablet Take 50 mg by mouth at bedtime.  . simvastatin (ZOCOR) 40 MG tablet TAKE ONE TABLET BY MOUTH IN THE EVENING   No facility-administered encounter medications on file as of 07/30/2020.  :  Review of Systems:  Out of a complete 14 point review of systems, all are reviewed and negative with the exception of these symptoms as listed below: Review of Systems  Neurological:       RM 2, alone. Last seen 01/28/2020. Here to follow up on PD, LBP. Ambulates with cane. Does not remember if he has fallen since last seen. However, he denies any injuries. Denies any new sx since last seen.    Objective:  Neurological Exam  Physical Exam Physical Examination:   Vitals:   07/30/20 1306  BP: (!) 143/76  Pulse: (!) 53    General Examination: The patient is a very pleasant 69 y.o. male in no acute distress. He appears well-developed and well-nourished and well groomed.   HEENT:Normocephalic, atraumatic, pupils are equal, round and reactive to light, extraocular tracking is slightly impaired. His face is slightly asymmetric with stable left facial weakness noted.  Very occasional twitching on the left side.  He has moderate facial masking. He has moderate hypophonia, slight dysarthria at times.Neck is moderately rigid,stable. Airway examination  revealed mild mouth dryness, adequate dental hygiene, no other changes.   Chest:Clear to auscultation without wheezing, rhonchi or crackles noted.  Heart:S1+S2+0, regular and normal without murmurs, rubs or gallops noted.   Abdomen:Soft, non-tender and non-distended.  Extremities:There isno pitting edema in the lower extremitiestoday.  Skin: Warm and dry without trophic changes noted.  Musculoskeletal: exam revealsL knee pain and LBP, not new.  Mild left knee swelling.  Neurologically:  Mental status: The patient is awake, alert and oriented in all 4 spheres. Hisimmediate and remote memory, attention, language skills and fund of knowledge are appropriate. There is no evidence of aphasia, agnosia, apraxia or anomia. Thought process is linear. Mood is normaland affect is normal.  Cranial nerves II - XII are as described above under HEENT exam. Motor exam: Normal bulk, strength for age, noresting tremortoday, except right hand intermittent tremor,no dyskinesia noted today. He has moderate difficulty with fine motor skills on the right including finger taps, hand movements and rapid alternating patting, foot taps and foot agility as well. On the left, hehasoverallmilderfindings. Sensory exam intact to light touch throughout. Overall moderatebradykinesia. He stands withoutMild difficulty, stands up with slightly wider stance, he has a stooped posture and right tilt with the upper body, he walks slowly and cautiously, no cane today. Decreased arm swing, turns slowly and cautiously.  Today you brought a single-point cane.  Assessment  and Plan:   In summary, Trevaun Rendleman a very pleasant58 year oldleft-handed gentleman with an underlying medical history of coronary artery disease, status post stent placement, history of left-sided Bell's palsy, overweight state,degenerative back d/s, with s/p ESI, arthritis withs/p R knee injection,hypertension, type 2 diabetes,and  status post lumbar spine surgery in June 2019,who presents for follow-up consultation of his right-sided predominant Parkinson's disease, complicated by a low back pain, knee pain, constipation and prior falls.  He has had ongoing issues with low back painandleft knee pain.  He sees Dr. Durward Fortes for this he has been using a cane more consistently.  He has had intermittent constipation but no significant problems lately.  He tries to stay active mentally and physically.  He tries to go to the [facility] about twice a week. He increased his Sinemet to 2 pills 4 times daily for about a week several months ago when we talked about his residual symptoms but he could not tolerate it.  He has been on Sinemet 2 pills 3 times daily and is fairly stable. He is advised to monitor his swallowing issues.  He may benefit from getting a formal evaluation through speech therapy and barium swallow at some point soon, we will revisit this at the next visit.  He is advised to follow-up routinely in 6 months, sooner if needed.  I answered all his questions today and he was in agreement.   I spent 20 minutes in total face-to-face time and in reviewing records during pre-charting, more than 50% of which was spent in counseling and coordination of care, reviewing test results, reviewing medications and treatment regimen and/or in discussing or reviewing the diagnosis of PD, the prognosis and treatment options. Pertinent laboratory and imaging test results that were available during this visit with the patient were reviewed by me and considered in my medical decision making (see chart for details).

## 2020-09-23 ENCOUNTER — Ambulatory Visit (INDEPENDENT_AMBULATORY_CARE_PROVIDER_SITE_OTHER): Payer: Medicare HMO

## 2020-09-23 ENCOUNTER — Encounter: Payer: Self-pay | Admitting: Orthopaedic Surgery

## 2020-09-23 ENCOUNTER — Other Ambulatory Visit: Payer: Self-pay

## 2020-09-23 ENCOUNTER — Ambulatory Visit (INDEPENDENT_AMBULATORY_CARE_PROVIDER_SITE_OTHER): Payer: Medicare HMO | Admitting: Orthopaedic Surgery

## 2020-09-23 VITALS — Ht 69.0 in | Wt 170.0 lb

## 2020-09-23 DIAGNOSIS — G8929 Other chronic pain: Secondary | ICD-10-CM

## 2020-09-23 DIAGNOSIS — M25562 Pain in left knee: Secondary | ICD-10-CM

## 2020-09-23 DIAGNOSIS — M25512 Pain in left shoulder: Secondary | ICD-10-CM

## 2020-09-23 DIAGNOSIS — M1712 Unilateral primary osteoarthritis, left knee: Secondary | ICD-10-CM

## 2020-09-23 MED ORDER — BUPIVACAINE HCL 0.25 % IJ SOLN
2.0000 mL | INTRAMUSCULAR | Status: AC | PRN
  Administered 2020-09-23: 2 mL via INTRA_ARTICULAR

## 2020-09-23 NOTE — Progress Notes (Signed)
Office Visit Note   Patient: Ronald [name]           Date of Birth: 06-19-1951           MRN: [id number] Visit Date: 09/23/2020              Requested by: Ronald Chen, MD [address] [place],  [place] [address] PCP: Ronald Chen, MD   Assessment & Plan: Visit Diagnoses:  1. Chronic pain of left knee   2. Chronic left shoulder pain   3. Unilateral primary osteoarthritis, left knee     Plan: Ronald [name] has advanced end-stage osteoarthritis of his left knee and left shoulder.  Had a long discussion regarding all of the above.  I am going to inject the more symptomatic left knee with betamethasone today and see him back in 2 weeks and inject the left shoulder  Follow-Up Instructions: Return in about 2 weeks (around 10/07/2020).   Orders:  Orders Placed This Encounter  Procedures  . XR KNEE 3 VIEW LEFT  . XR Shoulder Left   No orders of the defined types were placed in this encounter.     Procedures: Large Joint Inj: L knee on 09/23/2020 5:01 PM Indications: pain and diagnostic evaluation Details: 25 G 1.5 in needle, anteromedial approach  Arthrogram: No  Medications: 2 mL bupivacaine 0.25 %  2 mL betamethasone injected with Marcaine into the left knee medially Procedure, treatment alternatives, risks and benefits explained, specific risks discussed. Consent was given by the patient. Patient was prepped and draped in the usual sterile fashion.       Clinical Data: No additional findings.   Subjective: Chief Complaint  Patient presents with  . Left Knee - Pain  . Left Shoulder - Pain  Patient presents today for left shoulder and left knee pain. He said that his left knee has been hurting for years. He said that his pain is all throughout. He feels like his left knee is weak. He cannot go upstairs and lead with his left leg. Any activity causes pain. He rides an exercise bike daily and states that it is swollen afterwards. He takes Aleve for pain relief. He  states that the thinks he may have had a scope on this knee years with Dr.. He also has complaints today of left shoulder pain. He said that it has been hurting for two months. No known injury. He said that when he moves his arm across his body he can hear it "crunch". He has decreased range of motion. He is left hand dominant.  Multiple comorbidities including diabetes with peripheral neuropathy and Parkinson's.  Has had prior CABG procedure HPI  Review of Systems   Objective: Vital Signs: Ht 5\' 9"  (1.753 m)   Wt 170 lb (77.1 kg)   BMI 25.10 kg/m   Physical Exam Constitutional:      Appearance: He is well-developed and well-nourished.  HENT:     Mouth/Throat:     Mouth: Oropharynx is clear and moist.  Eyes:     Extraocular Movements: EOM normal.     Pupils: Pupils are equal, round, and reactive to light.  Pulmonary:     Effort: Pulmonary effort is normal.  Skin:    General: Skin is warm and dry.  Neurological:     Mental Status: He is alert and oriented to person, place, and time.  Psychiatric:        Mood and Affect: Mood and affect normal.  Behavior: Behavior normal.     Ortho Exam awake alert and oriented x3.  Comfortable sitting.  No use of ambulatory aid.  Does have somewhat of a halting gait probably based on his Parkinson's.  Left knee with increased varus.  Very minimal effusion.  Full extension of flexed about 105 degrees without instability.  Pain along the medial compartment diffusely.  No popping or clicking.  Left shoulder with somewhat limited motion and external rotation and some grating and crepitation with internal and external rotation consistent with his arthritis.  Biceps intact.  Good grip and good release.  Able to slowly place his arm fully over his head  Specialty Comments:  No specialty comments available.  Imaging: XR KNEE 3 VIEW LEFT  Result Date: 09/23/2020 Films of the left knee were obtained in 3 projections standing.  There is  some progress of arthritis particularly in the medial compartment where there is bone-on-bone.  Prior films from several years ago were used for comparison.  Are some degenerative changes in the lateral compartment and patella femoral compartment as well.  Films are consistent with advanced end-stage osteoarthritis with some progression over the last several years.  No evidence of acute change.  Films in the PACS were mistakenly titled from a prior patient with knee replacement  XR Shoulder Left  Result Date: 09/23/2020 Films of the left shoulder obtained in several projections.  There is significant osteoarthritis of the glenohumeral joint with inferiorly directed humeral head spurring, flattening of the humeral head with sclerosis.  There is pseudosubluxation.  There also is a large ectopic area of calcification in the superior glenoid system with osteoarthritis.  AC joint arthritis is identified as well.  No acute changes    PMFS History: Patient Active Problem List   Diagnosis Date Noted  . Essential hypertension 06/04/2020  . Pure hypercholesterolemia 06/04/2020  . CAD in native artery 06/04/2020  . Parkinson's disease (HCC) 06/04/2020  . Diabetes mellitus type 2 in nonobese (HCC) 06/04/2020  . Unilateral primary osteoarthritis, left knee 04/01/2019  . Chronic left-sided low back pain with left-sided sciatica 04/01/2019   Past Medical History:  Diagnosis Date  . CAD in native artery 06/04/2020   RCA infarct 2013.  S/p RCA and OM PCI  . Diabetes mellitus type 2 in nonobese (HCC) 06/04/2020  . Diabetes mellitus without complication (HCC)   . Essential hypertension 06/04/2020  . Hypertension   . Parkinson's disease (HCC)   . Parkinson's disease (HCC) 06/04/2020  . Pure hypercholesterolemia 06/04/2020    Family History  Problem Relation Age of Onset  . Diabetes Mother   . Sarcoidosis Mother   . Stroke Father     Past Surgical History:  Procedure Laterality Date  . APPENDECTOMY    .  back epidural   08/2017  . BACK SURGERY    . CORONARY ANGIOPLASTY WITH STENT PLACEMENT  08/19/2012  . KNEE SURGERY     Social History   Occupational History  . Occupation: Retired   Tobacco Use  . Smoking status: Former Games developer  . Smokeless tobacco: Never Used  Vaping Use  . Vaping Use: Never used  Substance and Sexual Activity  . Alcohol use: Not Currently  . Drug use: No  . Sexual activity: Not on file

## 2020-10-07 ENCOUNTER — Encounter: Payer: Self-pay | Admitting: Orthopaedic Surgery

## 2020-10-07 ENCOUNTER — Telehealth: Payer: Self-pay

## 2020-10-07 ENCOUNTER — Other Ambulatory Visit: Payer: Self-pay

## 2020-10-07 ENCOUNTER — Ambulatory Visit (INDEPENDENT_AMBULATORY_CARE_PROVIDER_SITE_OTHER): Payer: Medicare HMO | Admitting: Orthopaedic Surgery

## 2020-10-07 VITALS — Ht 69.0 in | Wt 170.0 lb

## 2020-10-07 DIAGNOSIS — M1712 Unilateral primary osteoarthritis, left knee: Secondary | ICD-10-CM

## 2020-10-07 DIAGNOSIS — M19012 Primary osteoarthritis, left shoulder: Secondary | ICD-10-CM | POA: Diagnosis not present

## 2020-10-07 MED ORDER — BUPIVACAINE HCL 0.5 % IJ SOLN
2.0000 mL | INTRAMUSCULAR | Status: AC | PRN
  Administered 2020-10-07: 2 mL via INTRA_ARTICULAR

## 2020-10-07 NOTE — Telephone Encounter (Signed)
Please precert for left knee visco injections. This is Dr.Whitfield's patient. Thanks! 

## 2020-10-07 NOTE — Progress Notes (Signed)
Office Visit Note   Patient: Ronald [name]           Date of Birth: 08/13/1951           MRN: [id number] Visit Date: 10/07/2020              Requested by: Ronald Chen, MD [address] [place],  [place] [address] PCP: Ronald Chen, MD   Assessment & Plan: Visit Diagnoses:  1. Unilateral primary osteoarthritis, left knee   2. Primary osteoarthritis, left shoulder     Plan: Ronald [name]'s was seen recently for evaluation of the arthritis left knee and left shoulder.  I injected his knee with cortisone he relates it is "helped some".  He has end-stage changes.  I think it is worth precertified viscosupplementation.  In addition knee x-rays demonstrate osteoarthritis of the left shoulder.  He is going to have a intra-articular cortisone injection today. This patient is diagnosed with osteoarthritis of the knee(s).    Radiographs show evidence of joint space narrowing, osteophytes, subchondral sclerosis and/or subchondral cysts.  This patient has knee pain which interferes with functional and activities of daily living.    This patient has experienced inadequate response, adverse effects and/or intolerance with conservative treatments such as acetaminophen, NSAIDS, topical creams, physical therapy or regular exercise, knee bracing and/or weight loss.   This patient has experienced inadequate response or has a contraindication to intra articular steroid injections for at least 3 months.   This patient is not scheduled to have a total knee replacement within 6 months of starting treatment with viscosupplementation.   Follow-Up Instructions: Return Precertify viscosupplementation.   Orders:  Orders Placed This Encounter  Procedures  . Large Joint Inj: L glenohumeral   No orders of the defined types were placed in this encounter.     Procedures: Large Joint Inj: L glenohumeral on 10/07/2020 10:12 AM Details: 25 G 1.5 in needle, posterior approach  Arthrogram:  No  Medications: 2 mL bupivacaine 0.5 %  2 mL betamethasone injected into the left glenohumeral joint with the Marcaine Procedure, treatment alternatives, risks and benefits explained, specific risks discussed. Consent was given by the patient. Immediately prior to procedure a time out was called to verify the correct patient, procedure, equipment, support staff and site/side marked as required. Patient was prepped and draped in the usual sterile fashion.       Clinical Data: No additional findings.   Subjective: Chief Complaint  Patient presents with  . Left Knee - Pain  . Left Shoulder - Pain  Patient presents today for a two week follow up on his left shoulder and left knee. He received a left knee injection at his last visit. He states that the injection did help some. He is wanting to have his left shoulder injected today.  History of Parkinson's and diabetes  HPI  Review of Systems   Objective: Vital Signs: Ht 5\' 9"  (1.753 m)   Wt 170 lb (77.1 kg)   BMI 25.10 kg/m   Physical Exam Constitutional:      Appearance: He is well-developed and well-nourished.  HENT:     Mouth/Throat:     Mouth: Oropharynx is clear and moist.  Eyes:     Extraocular Movements: EOM normal.     Pupils: Pupils are equal, round, and reactive to light.  Pulmonary:     Effort: Pulmonary effort is normal.  Skin:    General: Skin is warm and dry.  Neurological:     Mental Status: He  is alert and oriented to person, place, and time.  Psychiatric:        Mood and Affect: Mood and affect normal.        Behavior: Behavior normal.     Ortho Exam awake alert and oriented x3.  Comfortable sitting left knee feeling better after the recent betamethasone injection.  No effusion.  Still having some medial joint pain as expected given the severity of his arthritis.  Does have some discomfort with range of motion of his left shoulder as previously identified.  No crepitation.  Specialty Comments:   No specialty comments available.  Imaging: No results found.   PMFS History: Patient Active Problem List   Diagnosis Date Noted  . Primary osteoarthritis, 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  . Essential hypertension 06/04/2020  . Pure hypercholesterolemia 06/04/2020  . CAD in native artery 06/04/2020  . Parkinson's disease (HCC) 06/04/2020  . Diabetes mellitus type 2 in nonobese (HCC) 06/04/2020  . Unilateral primary osteoarthritis, left knee 04/01/2019  . Chronic left-sided low back pain with left-sided sciatica 04/01/2019   Past Medical History:  Diagnosis Date  . CAD in native artery 06/04/2020   RCA infarct 2013.  S/p RCA and OM PCI  . Diabetes mellitus type 2 in nonobese (HCC) 06/04/2020  . Diabetes mellitus without complication (HCC)   . Essential hypertension 06/04/2020  . Hypertension   . Parkinson's disease (HCC)   . Parkinson's disease (HCC) 06/04/2020  . Pure hypercholesterolemia 06/04/2020    Family History  Problem Relation Age of Onset  . Diabetes Mother   . Sarcoidosis Mother   . Stroke Father     Past Surgical History:  Procedure Laterality Date  . APPENDECTOMY    . back epidural   08/2017  . BACK SURGERY    . CORONARY ANGIOPLASTY WITH STENT PLACEMENT  08/19/2012  . KNEE SURGERY     Social History   Occupational History  . Occupation: Retired   Tobacco Use  . Smoking status: Former Games developer  . Smokeless tobacco: Never Used  Vaping Use  . Vaping Use: Never used  Substance and Sexual Activity  . Alcohol use: Not Currently  . Drug use: No  . Sexual activity: Not on file

## 2020-10-07 NOTE — Telephone Encounter (Signed)
Noted  

## 2020-10-29 ENCOUNTER — Telehealth: Payer: Self-pay

## 2020-10-29 NOTE — Telephone Encounter (Signed)
Submitted VOB for Orthovisc, left knee. 

## 2020-11-06 ENCOUNTER — Telehealth: Payer: Self-pay

## 2020-11-06 NOTE — Telephone Encounter (Signed)
PA required for Orthovisc, left knee. Faxed completed PA form to [employer] at [phone]. 

## 2020-11-11 ENCOUNTER — Telehealth: Payer: Self-pay

## 2020-11-11 NOTE — Telephone Encounter (Signed)
Called and left a Vm advising patient to call back to make an appointment with Dr. Cleophas Dunker gel injection.  Approved for Orthovisc, left knee. Buy & Bill Patient will be responsible for 20% OOP. Co-pay of $35.00 PA Approval# [id number] Valid 11/06/2020- 02/03/2021

## 2020-12-03 ENCOUNTER — Other Ambulatory Visit: Payer: Self-pay

## 2020-12-03 ENCOUNTER — Ambulatory Visit: Payer: Medicare HMO | Admitting: Orthopaedic Surgery

## 2020-12-03 ENCOUNTER — Encounter: Payer: Self-pay | Admitting: Orthopaedic Surgery

## 2020-12-03 VITALS — Ht 69.0 in | Wt 170.0 lb

## 2020-12-03 DIAGNOSIS — M1712 Unilateral primary osteoarthritis, left knee: Secondary | ICD-10-CM | POA: Diagnosis not present

## 2020-12-03 MED ORDER — HYALURONAN 30 MG/2ML IX SOSY
30.0000 mg | PREFILLED_SYRINGE | INTRA_ARTICULAR | Status: AC | PRN
  Administered 2020-12-03: 30 mg via INTRA_ARTICULAR

## 2020-12-03 NOTE — Progress Notes (Signed)
Office Visit Note   Patient: Ronald [name]           Date of Birth: [birth year]           MRN: [id number] Visit Date: 12/03/2020              Requested by: Woodroe Chen, MD [address] [place],  [place] [address] PCP: Woodroe Chen, MD   Assessment & Plan: Visit Diagnoses:  1. Unilateral primary osteoarthritis, left knee     Plan: First Orthovisc injection left knee without problem.  Return weekly for the next 2 weeks to complete the series  Follow-Up Instructions: Return in about 1 week (around 12/10/2020).   Orders:  Orders Placed This Encounter  Procedures  . Large Joint Inj: L knee   No orders of the defined types were placed in this encounter.     Procedures: Large Joint Inj: L knee on 12/03/2020 4:07 PM Indications: pain and joint swelling Details: 25 G 1.5 in needle, anteromedial approach  Arthrogram: No  Medications: 30 mg Hyaluronan 30 MG/2ML Outcome: tolerated well, no immediate complications Procedure, treatment alternatives, risks and benefits explained, specific risks discussed. Consent was given by the patient. Immediately prior to procedure a time out was called to verify the correct patient, procedure, equipment, support staff and site/side marked as required. Patient was prepped and draped in the usual sterile fashion.       Clinical Data: No additional findings.   Subjective: Chief Complaint  Patient presents with  . Left Knee - Follow-up    Orthovisc started 12/03/2020  Patient presents today for the first orthovisc injection in her left knee.  HPI  Review of Systems   Objective: Vital Signs: Ht 5\' 9"  (1.753 m)   Wt 170 lb (77.1 kg)   BMI 25.10 kg/m   Physical Exam  Ortho Exam minimal effusion left knee.  Mild medial joint pain.  No instability.  Specialty Comments:  No specialty comments available.  Imaging: No results found.   PMFS History: Patient Active Problem List   Diagnosis Date Noted  . Primary  osteoarthritis, 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  . Essential hypertension 06/04/2020  . Pure hypercholesterolemia 06/04/2020  . CAD in native artery 06/04/2020  . Parkinson's disease (HCC) 06/04/2020  . Diabetes mellitus type 2 in nonobese (HCC) 06/04/2020  . Unilateral primary osteoarthritis, left knee 04/01/2019  . Chronic left-sided low back pain with left-sided sciatica 04/01/2019   Past Medical History:  Diagnosis Date  . CAD in native artery 06/04/2020   RCA infarct 2013.  S/p RCA and OM PCI  . Diabetes mellitus type 2 in nonobese (HCC) 06/04/2020  . Diabetes mellitus without complication (HCC)   . Essential hypertension 06/04/2020  . Hypertension   . Parkinson's disease (HCC)   . Parkinson's disease (HCC) 06/04/2020  . Pure hypercholesterolemia 06/04/2020    Family History  Problem Relation Age of Onset  . Diabetes Mother   . Sarcoidosis Mother   . Stroke Father     Past Surgical History:  Procedure Laterality Date  . APPENDECTOMY    . back epidural   08/2017  . BACK SURGERY    . CORONARY ANGIOPLASTY WITH STENT PLACEMENT  08/19/2012  . KNEE SURGERY     Social History   Occupational History  . Occupation: Retired   Tobacco Use  . Smoking status: Former 14/09/2011  . Smokeless tobacco: Never Used  Vaping Use  . Vaping Use: Never used  Substance and Sexual Activity  . Alcohol use:  Not Currently  . Drug use: No  . Sexual activity: Not on file

## 2020-12-15 ENCOUNTER — Encounter: Payer: Self-pay | Admitting: Orthopaedic Surgery

## 2020-12-15 ENCOUNTER — Other Ambulatory Visit: Payer: Self-pay

## 2020-12-15 ENCOUNTER — Ambulatory Visit: Payer: Medicare HMO | Admitting: Orthopaedic Surgery

## 2020-12-15 VITALS — Ht 69.0 in | Wt 170.0 lb

## 2020-12-15 DIAGNOSIS — M1712 Unilateral primary osteoarthritis, left knee: Secondary | ICD-10-CM | POA: Diagnosis not present

## 2020-12-15 MED ORDER — HYALURONAN 30 MG/2ML IX SOSY
30.0000 mg | PREFILLED_SYRINGE | INTRA_ARTICULAR | Status: AC | PRN
  Administered 2020-12-15: 30 mg via INTRA_ARTICULAR

## 2020-12-15 NOTE — Progress Notes (Signed)
Office Visit Note   Patient: Ronald [name]           Date of Birth: 1950/12/03           MRN: [id number] Visit Date: 12/15/2020              Requested by: Woodroe Chen, MD [address] [place],  [place] [address] PCP: Woodroe Chen, MD   Assessment & Plan: Visit Diagnoses:  1. Unilateral primary osteoarthritis, left knee     Plan: Second Orthovisc injection left knee.  No problems after the first.  Return in 2 weeks to complete the series of 3  Follow-Up Instructions: Return in about 2 weeks (around 12/29/2020).   Orders:  Orders Placed This Encounter  Procedures  . Large Joint Inj: L knee   No orders of the defined types were placed in this encounter.     Procedures: Large Joint Inj: L knee on 12/15/2020 2:17 PM Indications: pain and joint swelling Details: 25 G 1.5 in needle, anteromedial approach  Arthrogram: No  Medications: 30 mg Hyaluronan 30 MG/2ML Outcome: tolerated well, no immediate complications Procedure, treatment alternatives, risks and benefits explained, specific risks discussed. Consent was given by the patient. Immediately prior to procedure a time out was called to verify the correct patient, procedure, equipment, support staff and site/side marked as required. Patient was prepped and draped in the usual sterile fashion.       Clinical Data: No additional findings.   Subjective: Chief Complaint  Patient presents with  . Left Knee - Follow-up    Orthovisc started 12/03/2020  Patient presents today for the second orthovisc injection in his left knee. He started the series on 12/03/2020. No complications from the first injection and he wishes to proceed with the next injection today.   HPI  Review of Systems   Objective: Vital Signs: Ht 5\' 9"  (1.753 m)   Wt 170 lb (77.1 kg)   BMI 25.10 kg/m   Physical Exam  Ortho Exam minimal effusion left knee.  The knee was not hot warm or red.  No areas of discomfort along the medial or  lateral compartment.  No instability  Specialty Comments:  No specialty comments available.  Imaging: No results found.   PMFS History: Patient Active Problem List   Diagnosis Date Noted  . Primary osteoarthritis, 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  . Essential hypertension 06/04/2020  . Pure hypercholesterolemia 06/04/2020  . CAD in native artery 06/04/2020  . Parkinson's disease (HCC) 06/04/2020  . Diabetes mellitus type 2 in nonobese (HCC) 06/04/2020  . Unilateral primary osteoarthritis, left knee 04/01/2019  . Chronic left-sided low back pain with left-sided sciatica 04/01/2019   Past Medical History:  Diagnosis Date  . CAD in native artery 06/04/2020   RCA infarct 2013.  S/p RCA and OM PCI  . Diabetes mellitus type 2 in nonobese (HCC) 06/04/2020  . Diabetes mellitus without complication (HCC)   . Essential hypertension 06/04/2020  . Hypertension   . Parkinson's disease (HCC)   . Parkinson's disease (HCC) 06/04/2020  . Pure hypercholesterolemia 06/04/2020    Family History  Problem Relation Age of Onset  . Diabetes Mother   . Sarcoidosis Mother   . Stroke Father     Past Surgical History:  Procedure Laterality Date  . APPENDECTOMY    . back epidural   08/2017  . BACK SURGERY    . CORONARY ANGIOPLASTY WITH STENT PLACEMENT  08/19/2012  . KNEE SURGERY     Social History  Occupational History  . Occupation: Retired   Tobacco Use  . Smoking status: Former Games developer  . Smokeless tobacco: Never Used  Vaping Use  . Vaping Use: Never used  Substance and Sexual Activity  . Alcohol use: Not Currently  . Drug use: No  . Sexual activity: Not on file

## 2020-12-29 ENCOUNTER — Encounter: Payer: Self-pay | Admitting: Orthopaedic Surgery

## 2020-12-29 ENCOUNTER — Ambulatory Visit: Payer: Medicare HMO | Admitting: Orthopaedic Surgery

## 2020-12-29 ENCOUNTER — Other Ambulatory Visit: Payer: Self-pay

## 2020-12-29 DIAGNOSIS — M1712 Unilateral primary osteoarthritis, left knee: Secondary | ICD-10-CM

## 2020-12-29 MED ORDER — HYALURONAN 30 MG/2ML IX SOSY
30.0000 mg | PREFILLED_SYRINGE | INTRA_ARTICULAR | Status: AC | PRN
  Administered 2020-12-29: 30 mg via INTRA_ARTICULAR

## 2020-12-29 NOTE — Progress Notes (Signed)
Office Visit Note   Patient: Ronald [name]           Date of Birth: 1951-04-23           MRN: [id number] Visit Date: 12/29/2020              Requested by: Woodroe Chen, MD [address] [place],  [place] [address] PCP: Woodroe Chen, MD   Assessment & Plan: Visit Diagnoses:  1. Unilateral primary osteoarthritis, left knee     Plan: Third and final Orthovisc injection left knee.  Notices not sure at this point that he can tell much of a difference.  We will plan to see him back as needed  Follow-Up Instructions: Return if symptoms worsen or fail to improve.   Orders:  Orders Placed This Encounter  Procedures  . Large Joint Inj: L knee   No orders of the defined types were placed in this encounter.     Procedures: Large Joint Inj: L knee on 12/29/2020 3:06 PM Indications: pain and joint swelling Details: 25 G 1.5 in needle, anteromedial approach  Arthrogram: No  Medications: 30 mg Hyaluronan 30 MG/2ML Outcome: tolerated well, no immediate complications Procedure, treatment alternatives, risks and benefits explained, specific risks discussed. Consent was given by the patient. Immediately prior to procedure a time out was called to verify the correct patient, procedure, equipment, support staff and site/side marked as required. Patient was prepped and draped in the usual sterile fashion.       Clinical Data: No additional findings.   Subjective: Chief Complaint  Patient presents with  . Left Knee - Pain  Has had 2 prior Orthovisc injections left knee not much of a difference.  Certainly no worse.  Uses a single-point cane for his balance related to his Parkinson's  HPI  Review of Systems   Objective: Vital Signs: There were no vitals taken for this visit.  Physical Exam  Ortho Exam awake alert and oriented x3.  Comfortable sitting.  Very minimal effusion left knee.  No significant pain.  Little bit of patella crepitation but no pain with  compression  Specialty Comments:  No specialty comments available.  Imaging: No results found.   PMFS History: Patient Active Problem List   Diagnosis Date Noted  . Primary osteoarthritis, 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  . Essential hypertension 06/04/2020  . Pure hypercholesterolemia 06/04/2020  . CAD in native artery 06/04/2020  . Parkinson's disease (HCC) 06/04/2020  . Diabetes mellitus type 2 in nonobese (HCC) 06/04/2020  . Unilateral primary osteoarthritis, left knee 04/01/2019  . Chronic left-sided low back pain with left-sided sciatica 04/01/2019   Past Medical History:  Diagnosis Date  . CAD in native artery 06/04/2020   RCA infarct 2013.  S/p RCA and OM PCI  . Diabetes mellitus type 2 in nonobese (HCC) 06/04/2020  . Diabetes mellitus without complication (HCC)   . Essential hypertension 06/04/2020  . Hypertension   . Parkinson's disease (HCC)   . Parkinson's disease (HCC) 06/04/2020  . Pure hypercholesterolemia 06/04/2020    Family History  Problem Relation Age of Onset  . Diabetes Mother   . Sarcoidosis Mother   . Stroke Father     Past Surgical History:  Procedure Laterality Date  . APPENDECTOMY    . back epidural   08/2017  . BACK SURGERY    . CORONARY ANGIOPLASTY WITH STENT PLACEMENT  08/19/2012  . KNEE SURGERY     Social History   Occupational History  . Occupation: Retired  Tobacco Use  . Smoking status: Former Games developer  . Smokeless tobacco: Never Used  Vaping Use  . Vaping Use: Never used  Substance and Sexual Activity  . Alcohol use: Not Currently  . Drug use: No  . Sexual activity: Not on file     Valeria Batman, MD   Note - This record has been created using [employer].  Chart creation errors have been sought, but may not always  have been located. Such creation errors do not reflect on  the standard of medical care.

## 2021-01-27 ENCOUNTER — Ambulatory Visit: Payer: Medicare HMO | Admitting: Neurology

## 2021-01-27 ENCOUNTER — Encounter: Payer: Self-pay | Admitting: Neurology

## 2021-01-27 ENCOUNTER — Other Ambulatory Visit: Payer: Self-pay

## 2021-01-27 VITALS — BP 132/76 | HR 57 | Ht 69.0 in | Wt 177.0 lb

## 2021-01-27 DIAGNOSIS — M545 Low back pain, unspecified: Secondary | ICD-10-CM | POA: Diagnosis not present

## 2021-01-27 DIAGNOSIS — M25562 Pain in left knee: Secondary | ICD-10-CM

## 2021-01-27 DIAGNOSIS — R269 Unspecified abnormalities of gait and mobility: Secondary | ICD-10-CM

## 2021-01-27 DIAGNOSIS — G20 Parkinson's disease: Secondary | ICD-10-CM | POA: Diagnosis not present

## 2021-01-27 DIAGNOSIS — Z9181 History of falling: Secondary | ICD-10-CM

## 2021-01-27 DIAGNOSIS — G8929 Other chronic pain: Secondary | ICD-10-CM

## 2021-01-27 MED ORDER — CARBIDOPA-LEVODOPA 25-100 MG PO TABS: 1.5000 | ORAL_TABLET | Freq: Four times a day (QID) | ORAL | 3 refills | Status: DC

## 2021-01-27 NOTE — Progress Notes (Signed)
Subjective:    Patient ID: Ronald [name] is a 70 y.o. male.  HPI     Interim history:   Ronald [name] is a 70 year old left-handed gentleman with an underlying medical history of diabetes, Bell's palsy some 10+ years ago, overweight state, CAD (s/p MI in 2013, s/p 3 stents), and hypertension who presents for follow-up consultation of his Parkinson's disease, with right-sided predominance. The patient is unaccompanied today and presents for his 11-monthcheckup.  I last saw him on 07/30/2020, at which time he reported feeling fairly stable.  He was taking Sinemet 2 pills 3 times daily.  He was trying to stay active and go to the gym about twice a week.  He reported occasional coughing when drinking water but did not want to seek formal evaluation with speech therapy or swallow study.  He had noticed some swelling around his ankles.  He was switched by his cardiologist from Zocor to Crestor. He was advised to continue with generic Sinemet 2 pills 3 times daily.  Today, 01/27/2021: He reports doing fairly well but he has had worsening knee pain and low back pain and has fallen.  Thankfully, he has not sustained any major injuries but has had freezing spells and difficulty with turns, knee gave out at times.  He uses a cane outside the home but often not inside the home.  He has not used a walker.  He does have a walker available, it is his mother-in-law's walker which she currently does not use.  He has not been able to go to the gym as frequently as before because of worsening knee pain.  He had a gel injection into the left knee through Dr. WDurward Fortes  His primary care physician had ordered physical therapy.  [place] this in [place]  It was marginally helpful.   The patient's allergies, current medications, family history, past medical history, past social history, past surgical history and problem list were reviewed and updated as appropriate.    Previously (copied from previous notes for  reference):    I saw him on 01/28/2020, at which time he reported ongoing issues with intermittent constipation.  He had fallen a couple of times but thankfully without any major injuries.  We talked about the importance of fall prevention and constipation control.  He was not able to tolerate Sinemet at 2 pills 4 times daily and had scaled back to 2 pills 3 times daily.  He was advised to continue with this. He was advised to use his cane consistently.   I saw him on 09/30/2019, at which time he reported that his balance was worse and he had 2 falls because his left knee gave out.  He had arthritis issues with the knees and is seen orthopedics.  [place]  He felt that overall his slowness was worse.  He was advised to increase his Sinemet to 2 pills 4 times daily from tid.      I saw him on 05/30/2019, at which time he reported ongoing problems with his low back pain.  He had tried medication including narcotic pain medication, muscle relaxer and gabapentin.  He was supposed to have a neurosurgical evaluation soon.  He had problems with constipation.  He was still taking Sinemet 2 pills 3 times daily with reasonably good results.  He also reported overall feeling more stressed.  He was advised to be more proactive about constipation issues and try to be more active  physically with the help of a recumbent stationary bike if possible.   I saw him on 11/27/2018, at which time he was complaining of low back pain.  He had lumbar spine x-rays in November 2019 which showed some degenerative changes.  He had received a lumbar spine injection under Dr. Durward Fortes in December which he felt was not that helpful.  He felt that the back pain was enough to limit his exercise.  He had not pursued his hobby of making pens very much either.  He was advised to continue with Sinemet 2 pills 3 times daily.  He was advised to try gabapentin low dose with gradual increase to up to  100 mg 3 times daily.    I saw him on 05/29/2018, at which time he reported having done well after interim back surgery on 03/06/2018. He also had radiofrequency surgery prior to that. He reported occasional dizziness and nausea, some loss of appetite. I suggested we change his Sinemet from 2 pills 3 times a day to 1-1/2 pills 4 times a day. He reported falls. He was physically quite active, was going to the [facility]. His falls primarily occurred when he was turning.    11/02/2017, at which time he reported more back pain. He had undergone an injection in December 2018 but had found no relief. [place] He was active physically but had to take a break because of back pain. He was taking Mobic for back pain. He had a slow reduction and his hemoglobin and red cell counts and I counseled him on the daily use of meloxicam.     I saw him on 05/02/2017, at which time he was complaining of lower back pain. He had seen orthopedics for this. He also had right knee pain, status post injection. He had not fallen thankfully. He was taking Sinemet 2 pills 3 times a day.   I saw him on 10/31/2016, at which time he reported doing okay, no recent new symptoms, in particular no significant memory loss or mood disorder constipation. He did have an episode of chest pain recently. He saw his cardiologist. He felt to be a little dehydrated and blood pressure was on the lower end of normal. He was noted to have some orthostatic hypotension. He was started on northera 100 mg tid, but had not filled it yet. He was trying to stay active and going to the [facility] 3 times a week. He was also interested in pursuing boxing classes for PD. He had lost a little bit of weight and reported some loss of appetite. I suggested he continue with Sinemet 2 pills 3 times a day. Advised to change positions slowly and be mindful of his blood pressure dropping potentially with sudden changes of position and he was reminded to stay  well-hydrated.   I first met him on 04/27/2016 at the request of his primary care physician, at which time he reported a prior diagnosis of Parkinson's disease in 2014 and symptoms dating back to early 2014. He was exercising regularly, he was doing well on symptomatic treatment with Sinemet and I suggested he continue with the medication, at 2 pills 3 times a day. He was reminded to stay active physically and mentally and stable hydrated and well-nourished.    04/27/2016: He was diagnosed with Parkinson's disease in 2014. Symptoms date back to beginning of 2014, and started with speech changes, was noted to dragging his feet.  He has been on Sinemet for the past 3 years,  with gradual increase, currently 2 pills in the morning, 2 in the afternoon and 1 at night, was asked to reduce it, He is not sure why, seems to tolerate it well and believes it has helped. He is wondering however if there is any new medication available. He has not been on a dopamine agonist before from what I understand, no Azilect, no other MAO B inhibitor either.  He exercises regularly and is enrolled in the PD spinning class at 2 different [employer]. Lives with his wife of 56 years in [place], and they have 3 grown daughters, 2 GC. He is a nonsmoker, drinks alcohol infrequently, is a retired Geophysicist/field seismologist.  I reviewed your office note from 02/08/2016. He used to see Dr. Marijean Bravo, neurologist out of [facility] but Dr. Marijean Bravo retired. He then saw another neurologist in the same office but she left. Prior office records from his previous neurologists are not available for my review today. We will request office records from Dr. Barbera Setters office, patient signed a ROI form today and is agreeable.   He denies any major memory or mood issues or sleep issues, does snore some, but no apneas reported, no hx of RBD. No FHx of PD.  He varies his C/L dose, 1st dose 6-9 AM, second dose around 12 or 1 PM and last dose around 8 PM.    He also plays golf,  once or twice a week. He tries to stay well-hydrated, may not always drink enough water however.  His Past Medical History Is Significant For: Past Medical History:  Diagnosis Date  . CAD in native artery 06/04/2020   RCA infarct 2013.  S/p RCA and OM PCI  . Diabetes mellitus type 2 in nonobese ([place]) 06/04/2020  . Diabetes mellitus without complication ([place])   . Essential hypertension 06/04/2020  . Hypertension   . Parkinson's disease ([place])   . Parkinson's disease ([place]) 06/04/2020  . Pure hypercholesterolemia 06/04/2020    His Past Surgical History Is Significant For: Past Surgical History:  Procedure Laterality Date  . APPENDECTOMY    . back epidural   08/2017  . BACK SURGERY    . CORONARY ANGIOPLASTY WITH STENT PLACEMENT  08/19/2012  . KNEE SURGERY      His Family History Is Significant For: Family History  Problem Relation Age of Onset  . Diabetes Mother   . Sarcoidosis Mother   . Stroke Father     His Social History Is Significant For: Social History   Socioeconomic History  . Marital status: Married    Spouse name: Memory Dance   . Number of children: 3  . Years of education: Not on file  . Highest education level: Not on file  Occupational History  . Occupation: Retired   Tobacco Use  . Smoking status: Former Research scientist (life sciences)  . Smokeless tobacco: Never Used  Vaping Use  . Vaping Use: Never used  Substance and Sexual Activity  . Alcohol use: Not Currently  . Drug use: No  . Sexual activity: Not on file  Other Topics Concern  . Not on file  Social History Narrative   Drinks about 1 cup of coffee a day    Social Determinants of Health   Financial Resource Strain: Not on file  Food Insecurity: Not on file  Transportation Needs: Not on file  Physical Activity: Not on file  Stress: Not on file  Social Connections: Not on file    His Allergies Are:  No Known Allergies:   His Current Medications Are:  Outpatient Encounter Medications as of 01/27/2021  Medication Sig   . aspirin 81 MG chewable tablet Chew by mouth.  . carbidopa-levodopa (SINEMET IR) 25-100 MG tablet Take 2 tablets by mouth 3 (three) times daily.  Marland Kitchen lisinopril (ZESTRIL) 10 MG tablet Take by mouth.  . metoprolol succinate (TOPROL-XL) 25 MG 24 hr tablet Take by mouth.  . oxybutynin (DITROPAN) 5 MG tablet TAKE ONE TABLET BY MOUTH TWICE DAILY NEEDS  APPT  . pioglitazone (ACTOS) 15 MG tablet daily.  . sertraline (ZOLOFT) 50 MG tablet Take 50 mg by mouth at bedtime.  . simvastatin (ZOCOR) 40 MG tablet TAKE ONE TABLET BY MOUTH IN THE EVENING  . rosuvastatin (CRESTOR) 20 MG tablet Take 1 tablet (20 mg total) by mouth daily.   No facility-administered encounter medications on file as of 01/27/2021.  :  Review of Systems:  Out of a complete 14 point review of systems, all are reviewed and negative with the exception of these symptoms as listed below: Review of Systems  Neurological:       Here for 6 month f/u. Reports 3-4 falls since last visit denies any serious injuries. Reports he feels the same since last visit.     Objective:  Neurological Exam  Physical Exam Physical Examination:   Vitals:   01/27/21 1347  BP: 132/76  Pulse: (!) 57  SpO2: 98%    General Examination: The patient is a very pleasant 70 y.o. male in no acute distress. He appears well-developed and well-nourished and well groomed.   HEENT:Normocephalic, atraumatic, pupils are equal, round and reactive to light, extraocular tracking is mildly impaired, face is asymmetric with fairly stable appearing left facial weakness and occasional blepharospasm and hemifacial spasm noted.  He has moderate facial masking overall, moderate to severe hypophonia and mild dysarthria.  Neck is moderately rigid.  Airway examination reveals stable findings, mild mouth dryness, adequate dental hygiene, tongue protrudes centrally and palate elevates symmetrically.  No sialorrhea.   Chest:Clear to auscultation without wheezing, rhonchi or  crackles noted.  Heart:S1+S2+0, regular and normal without murmurs, rubs or gallops noted.   Abdomen:Soft, non-tender and non-distended.  Extremities:There isno pitting edema in the lower extremitiestoday.  Skin: Warm and dry without trophic changes noted.  Musculoskeletal: exam revealsL knee pain and LBP, not new.  Mild left knee swelling.  Neurologically:  Mental status: The patient is awake, alert and oriented in all 4 spheres. Hisimmediate and remote memory, attention, language skills and fund of knowledge are appropriate. There is no evidence of aphasia, agnosia, apraxia or anomia. Thought process is linear. Mood is normaland affect is normal.  Cranial nerves II - XII are as described above under HEENT exam. Motor exam: Normal bulk, strength for age, noresting tremortoday, except right hand intermittent tremor,nodyskinesianoted today.He has moderate difficulty with fine motor skills on the right including finger taps, hand movements and rapid alternating patting, foot taps and foot agility as well. On the left, hehasoverallmilderfindings. Sensory exam intact to light touch throughout. Overall moderatebradykinesia. He stands withoutMild difficulty, stands up with slightly wider stance, he has a stooped posture, seems worse, and right tilt with the upper body, he walks slowly and cautiously, with a single-point cane, does not treatment left knee completely and has a significant limp on the left.    Assessment and Plan:   In summary, Ronald [name] a very pleasant44 year oldleft-handed gentleman with an underlying medical history of coronary artery disease, status post stent placement, history of left-sided Bell's palsy, overweight state,degenerative back d/s,  with s/p ESI, arthritis withs/p R knee injection, status post left knee injection,hypertension, type 2 diabetes,and status post lumbar spine surgery in June 2019,who presents for follow-up  consultation of his right-sided predominant Parkinson's disease,complicated by a low back pain, knee pain, constipation and  recurrent falls.  He sees Dr. Durward Fortes for this he has been using a cane more consistently.  We talked about the importance of fall prevention.  He is advised to consider using a walker.  He may have 1 available to try at home but is reluctant to do so and his wife may not be supportive of a walker, he feels. He has had intermittent constipation but no significant problems lately.  He had physical therapy.  He is not going to the gym as consistently.  He is on Sinemet 2 pills 3 times daily, typically around 9, around 12, and last dose around 6 PM.  When he tried increasing the Sinemet to 2 pills 4 times daily he could not tolerate it.  We talked about adding Nourianz at this time, he is reluctant to add any new medication.  We may be able to increase his Sinemet gradually by changing it to 1-1/2 pills at a time.  For now, we will keep the total dose the same and change the regimen to 1-1/2 pills 4 times a day at 9, 12, 3 PM and 6 PM daily.  We may add at 5th dose later on.  He is advised to consider adding Nourianz.  We will pick up our discussion next time.  He is advised to follow-up routinely in 4 months, sooner if needed.  I answered all his questions today and he was in agreement.  I spent 30 minutes in total face-to-face time and in reviewing records during pre-charting, more than 50% of which was spent in counseling and coordination of care, reviewing test results, reviewing medications and treatment regimen and/or in discussing or reviewing the diagnosis of PD, the prognosis and treatment options. Pertinent laboratory and imaging test results that were available during this visit with the patient were reviewed by me and considered in my medical decision making (see chart for details).

## 2021-01-27 NOTE — Patient Instructions (Signed)
It was nice to see you again today.  I am sorry you have had falls.  Please consider using a walker, as I understand you have a 2 wheeled walker in the house from your mother-in-law who is currently not using her walker.  I do not believe that you are completely safe to use the cane any longer given your low back pain and exacerbation of your left knee pain, you are limping significantly and your posture has changed. Fall risk is real! Please remember to stand up slowly and get your bearings first turn slowly, no bending down to pick anything, no heavy lifting, be extra careful at night and first thing in the morning. Also, be careful in the Bathroom and the kitchen.  As discussed, we will change your Sinemet to 1-1/2 pills 4 times a day.  We may add a 5th dose at the next appointment.  For future consideration, we we may be able to add a new medication called Nourianz 20 mg daily for off times.  You can take it once a day, side effects may include involuntary movements called dyskinesia, constipation, Nausea, dizziness, decreased appetite, rare side effects may include rash, diarrhea and hallucinations.    We will pick up our discussion next time. Please continue to hydrate well with water.  Follow-up in 4 months, or sooner if needed.

## 2021-04-13 DIAGNOSIS — Z8616 Personal history of COVID-19: Secondary | ICD-10-CM | POA: Insufficient documentation

## 2021-04-21 ENCOUNTER — Ambulatory Visit: Payer: Medicare HMO | Admitting: Orthopaedic Surgery

## 2021-04-21 ENCOUNTER — Encounter: Payer: Self-pay | Admitting: Orthopaedic Surgery

## 2021-04-21 ENCOUNTER — Other Ambulatory Visit: Payer: Self-pay

## 2021-04-21 DIAGNOSIS — M7022 Olecranon bursitis, left elbow: Secondary | ICD-10-CM | POA: Diagnosis not present

## 2021-04-21 NOTE — Progress Notes (Signed)
Office Visit Note   Patient: Ronald [name]           Date of Birth: [birth year]           MRN: [id number] Visit Date: 04/21/2021              Requested by: Woodroe Chen, MD [address] [place],  [place] [address] PCP: Woodroe Chen, MD   Assessment & Plan: Visit Diagnoses:  1. Olecranon bursitis, left elbow     Plan: Notice relates that he has had a cyst "swelling" in his left elbow for several months.  I occasionally will be painful but denies any heat.  He notes it is a little uncomfortable when he applies pressure.  He does have history of Parkinson's and uses a cane and falls frequently.  He believes that the swelling occurred after 1 or 2 falls.  He has evidence of olecranon bursa without evidence of inflammation.  There was no redness, heat or pain.  I aspirated about 60 cc of bloody fluid and then injected a cc of Xylocaine and 40 mg Depo-Medrol.  This is atraumatic bursitis.  I have also discussed the possibility of it recurring over time and possibly considering surgical excision should interfere with his activities.  He had full range of motion of his elbow without any pain  Follow-Up Instructions: Return if symptoms worsen or fail to improve.   Orders:  No orders of the defined types were placed in this encounter.  No orders of the defined types were placed in this encounter.     Procedures: Incision & Drainage  Date/Time: 04/21/2021 10:52 AM Performed by: Valeria Batman, MD Authorized by: Valeria Batman, MD   Consent:    Consent obtained:  Verbal   Consent given by:  Patient   Risks, benefits, and alternatives were discussed: yes     Risks discussed:  Incomplete drainage Location:    Type:  Bursa   Location:  Upper extremity   Upper extremity location:  Elbow   Elbow location:  L elbow Pre-procedure details:    Skin preparation:  Alcohol and povidone-iodine Sedation:    Sedation type:  None Anesthesia:    Anesthesia method:  Local  infiltration   Local anesthetic:  Lidocaine 1% w/o epi Procedure type:    Complexity:  Simple Procedure details:    Needle aspiration: yes     Drainage:  Bloody   Drainage amount:  Moderate Post-procedure details:    Procedure completion:  Tolerated well, no immediate complications Comments:     Aspirated 60 cc of bloody fluid and injected 1 cc of 1% Xylocaine with epinephrine after skin infiltration of same and 40 mg Depo-Medrol   Clinical Data: No additional findings.   Subjective: Chief Complaint  Patient presents with   Left Elbow - Pain  Patient presents today for his left elbow. He states that he has fallen multiple times. He has a swollen area on the backside of his left elbow. He said that it only hurts if he applies pressure on his elbow. He is not taking anything for pain. He is left hand dominant.   HPI  Review of Systems   Objective: Vital Signs: Ht 5\' 9"  (1.753 m)   Wt 177 lb (80.3 kg)   BMI 26.14 kg/m   Physical Exam Constitutional:      Appearance: He is well-developed.  Pulmonary:     Effort: Pulmonary effort is normal.  Skin:    General: Skin is warm  and dry.  Neurological:     Mental Status: He is alert and oriented to person, place, and time.  Psychiatric:        Behavior: Behavior normal.    Ortho Exam awake and alert.  Has history of Parkinson's and does have an altered gait uses a cane for stability and balance.  Exam was localized to the left elbow where he has a typical olecranon bursa.  The bursa was not painful.  There was no increased heat or ecchymosis.  Full range of motion of the elbow in pronation supination flexion extension.  Specialty Comments:  No specialty comments available.  Imaging: No results found.   PMFS History: Patient Active Problem List   Diagnosis Date Noted   Olecranon bursitis, left elbow 04/21/2021   Primary osteoarthritis, 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   Essential hypertension 06/04/2020   Pure  hypercholesterolemia 06/04/2020   CAD in native artery 06/04/2020   Parkinson's disease ([facility]) 06/04/2020   Diabetes mellitus type 2 in nonobese ([facility]) 06/04/2020   Unilateral primary osteoarthritis, left knee 04/01/2019   Chronic left-sided low back pain with left-sided sciatica 04/01/2019   Past Medical History:  Diagnosis Date   CAD in native artery 06/04/2020   RCA infarct 2013.  S/p RCA and OM PCI   Diabetes mellitus type 2 in nonobese ([facility]) 06/04/2020   Diabetes mellitus without complication ([facility])    Essential hypertension 06/04/2020   Hypertension    Parkinson's disease ([facility])    Parkinson's disease ([facility]) 06/04/2020   Pure hypercholesterolemia 06/04/2020    Family History  Problem Relation Age of Onset   Diabetes Mother    Sarcoidosis Mother    Stroke Father     Past Surgical History:  Procedure Laterality Date   APPENDECTOMY     back epidural   08/2017   BACK SURGERY     CORONARY ANGIOPLASTY WITH STENT PLACEMENT  08/19/2012   KNEE SURGERY     Social History   Occupational History   Occupation: Retired   Tobacco Use   Smoking status: Former   Smokeless tobacco: Never  Building services engineer Use: Never used  Substance and Sexual Activity   Alcohol use: Not Currently   Drug use: No   Sexual activity: Not on file

## 2021-06-03 ENCOUNTER — Encounter: Payer: Self-pay | Admitting: Neurology

## 2021-06-03 ENCOUNTER — Ambulatory Visit: Payer: Medicare HMO | Admitting: Neurology

## 2021-06-03 VITALS — BP 137/78 | HR 54 | Ht 69.0 in | Wt 175.8 lb

## 2021-06-03 DIAGNOSIS — M25562 Pain in left knee: Secondary | ICD-10-CM | POA: Diagnosis not present

## 2021-06-03 DIAGNOSIS — G8929 Other chronic pain: Secondary | ICD-10-CM

## 2021-06-03 DIAGNOSIS — K5909 Other constipation: Secondary | ICD-10-CM

## 2021-06-03 DIAGNOSIS — G20 Parkinson's disease: Secondary | ICD-10-CM

## 2021-06-03 DIAGNOSIS — Z9181 History of falling: Secondary | ICD-10-CM

## 2021-06-03 MED ORDER — CARBIDOPA-LEVODOPA 25-100 MG PO TABS: ORAL_TABLET | ORAL | 3 refills | Status: DC

## 2021-06-03 NOTE — Patient Instructions (Signed)
It was nice to see you again today.   Fall risk is real! Please remember to stand up slowly and get your bearings first turn slowly, no bending down to pick anything, no heavy lifting, be extra careful at night and first thing in the morning. Also, be careful in the Bathroom and the kitchen.   Constipation can be a big problem in patients with Parkinson's disease. It is important to be proactive: this includes ensuring adequate water intake and mobilization (walking around), utilizing stool softeners as needed, an over-the-counter laxative as needed up to daily if needed, adding a probiotic in pill form or in the form of yogurt can help as well. Sometimes, using a suppository or enema becomes necessary.   Please try to hydrate better with water, try to drink 6 to 8 cups of water per day on average.  Please continue using your cane and knee brace, make a follow-up appointment with your orthopedic surgeon if needed.  As discussed, I would like for you to try to increase your Sinemet to 2 pills in the morning and 2 pills in the evening, continue with 1-1/2 pills at 1 PM and 5 PM.  You will take a total of 7 pills which is an increase from 6 pills daily.

## 2021-06-03 NOTE — Progress Notes (Signed)
Subjective:    Patient ID: Ronald [name] is a 70 y.o. male.  HPI    Interim history:   Ronald [name] is a 70 year old left-handed gentleman with an underlying medical history of diabetes, Bell's palsy some 10+ years ago, overweight state, CAD (s/p MI in 2013, s/p 3 stents), and hypertension who presents for follow-up consultation of his Parkinson's disease, with right-sided predominance. The patient is unaccompanied today and presents for his 70-monthcheckup.  I last saw him on 01/27/2021, at which time he reported doing fairly well, he had ongoing issues with knee pain and low back pain.  He reported having fallen.  He had not used a walker and he was not keen on starting to use a walker.  We talked about potentially utilizing Nourianz.    Today, 06/03/2021: He reports significant left knee pain.  He wears a prescription brace when he goes out and uses a cane.  He wears a elastic knee sleeve at home typically.  He has fallen a couple of times, as he recalls in the kitchen, fell backwards, landed on his back but did not hit his head.  He tries to hydrate well, constipation is under reasonable control with a bowel movement every other day or so, uses MiraLAX as needed.  He has no significant motor fluctuations, no significant time of the day is better than other times.  He is without any cognitive complaints, mood is stable.  He has been taking Sinemet 1-1/2 pills 4 times a day at 9 AM, 1 PM, 5 PM and 9 PM.  He does get up at night to use the bathroom on average 3 times.  He could do better with water intake by self admission.  He is expecting another grandchild in November.  His middle daughter [place] lives in [place] second child.  She has a little boy.  His youngest daughter who is the flight attendant has a little girl and his oldest daughter who is an attorney in [place] boy and a girl.  He [place] travel [place].   The patient's allergies, current  medications, family history, past medical history, past social history, past surgical history and problem list were reviewed and updated as appropriate.    Previously (copied from previous notes for reference):    I saw him on 07/30/2020, at which time he reported feeling fairly stable.  He was taking Sinemet 2 pills 3 times daily.  He was trying to stay active and go to the gym about twice a week.  He reported occasional coughing when drinking water but did not want to seek formal evaluation with speech therapy or swallow study.  He had noticed some swelling around his ankles.  He was switched by his cardiologist from Zocor to Crestor. He was advised to continue with generic Sinemet 2 pills 3 times daily.   I saw him on 01/28/2020, at which time he reported ongoing issues with intermittent constipation.  He had fallen a couple of times but thankfully without any major injuries.  We talked about the importance of fall prevention and constipation control.  He was not able to tolerate Sinemet at 2 pills 4 times daily and had scaled back to 2 pills 3 times daily.  He was advised to continue with this. He was advised to use his cane consistently.   I saw him on 09/30/2019, at which time he reported that his balance was worse and he had 2  falls because his left knee gave out.  He had arthritis issues with the knees and is seen orthopedics.  He had seen neurosurgery in [place] in September 2020.  He felt that overall his slowness was worse.  He was advised to increase his Sinemet to 2 pills 4 times daily from tid.      I saw him on 05/30/2019, at which time he reported ongoing problems with his low back pain.  He had tried medication including narcotic pain medication, muscle relaxer and gabapentin.  He was supposed to have a neurosurgical evaluation soon.  He had problems with constipation.  He was still taking Sinemet 2 pills 3 times daily with reasonably good results.  He also reported overall feeling more  stressed.  He was advised to be more proactive about constipation issues and try to be more active physically with the help of a recumbent stationary bike if possible.   I saw him on 11/27/2018, at which time he was complaining of low back pain.  He had lumbar spine x-rays in November 2019 which showed some degenerative changes.  He had received a lumbar spine injection under Dr. Durward Fortes in December which he felt was not that helpful.  He felt that the back pain was enough to limit his exercise.  He had not pursued his hobby of making pens very much either.  He was advised to continue with Sinemet 2 pills 3 times daily.  He was advised to try gabapentin low dose with gradual increase to up to 100 mg 3 times daily.    I saw him on 05/29/2018, at which time he reported having done well after interim back surgery on 03/06/2018. He also had radiofrequency surgery prior to that. He reported occasional dizziness and nausea, some loss of appetite. I suggested we change his Sinemet from 2 pills 3 times a day to 1-1/2 pills 4 times a day. He reported falls. He was physically quite active, was going to the [facility]. His falls primarily occurred when he was turning.    11/02/2017, at which time he reported more back pain. He had undergone an injection in December 2018 but had found no relief. [place]. He was active physically but had to take a break because of back pain. He was taking Mobic for back pain. He had a slow reduction and his hemoglobin and red cell counts and I counseled him on the daily use of meloxicam.     I saw him on 05/02/2017, at which time he was complaining of lower back pain. He had seen orthopedics for this. He also had right knee pain, status post injection. He had not fallen thankfully. He was taking Sinemet 2 pills 3 times a day.   I saw him on 10/31/2016, at which time he reported doing okay, no recent new symptoms, in particular no significant memory loss or mood disorder  constipation. He did have an episode of chest pain recently. He saw his cardiologist. He felt to be a little dehydrated and blood pressure was on the lower end of normal. He was noted to have some orthostatic hypotension. He was started on northera 100 mg tid, but had not filled it yet. He was trying to stay active and going to the [facility] 3 times a week. He was also interested in pursuing boxing classes for PD. He had lost a little bit of weight and reported some loss of appetite. I suggested he continue with Sinemet 2 pills 3 times a day.  Advised to change positions slowly and be mindful of his blood pressure dropping potentially with sudden changes of position and he was reminded to stay well-hydrated.   I first met him on 04/27/2016 at the request of his primary care physician, at which time he reported a prior diagnosis of Parkinson's disease in 2014 and symptoms dating back to early 2014. He was exercising regularly, he was doing well on symptomatic treatment with Sinemet and I suggested he continue with the medication, at 2 pills 3 times a day. He was reminded to stay active physically and mentally and stable hydrated and well-nourished.    04/27/2016: He was diagnosed with Parkinson's disease in 2014. Symptoms date back to beginning of 2014, and started with speech changes, was noted to dragging his feet.  He has been on Sinemet for the past 3 years, with gradual increase, currently 2 pills in the morning, 2 in the afternoon and 1 at night, was asked to reduce it, He is not sure why, seems to tolerate it well and believes it has helped. He is wondering however if there is any new medication available. He has not been on a dopamine agonist before from what I understand, no Azilect, no other MAO B inhibitor either.  He exercises regularly and is enrolled in the PD spinning class at 2 different [employer]. Lives with his wife of 22 years in [place], and they have 3 grown daughters, 2 GC. He is a nonsmoker,  drinks alcohol infrequently, is a retired Geophysicist/field seismologist.  I reviewed your office note from 02/08/2016. He used to see Dr. Marijean Bravo, neurologist out of [facility] but Dr. Marijean Bravo retired. He then saw another neurologist in the same office but she left. Prior office records from his previous neurologists are not available for my review today. We will request office records from Dr. Barbera Setters office, patient signed a ROI form today and is agreeable.   He denies any major memory or mood issues or sleep issues, does snore some, but no apneas reported, no hx of RBD. No FHx of PD.  He varies his C/L dose, 1st dose 6-9 AM, second dose around 12 or 1 PM and last dose around 8 PM.    He also plays golf, once or twice a week. He tries to stay well-hydrated, may not always drink enough water however.  His Past Medical History Is Significant For: Past Medical History:  Diagnosis Date   CAD in native artery 06/04/2020   RCA infarct 2013.  S/p RCA and OM PCI   Diabetes mellitus type 2 in nonobese ([facility]) 06/04/2020   Diabetes mellitus without complication ([place])    Essential hypertension 06/04/2020   Hypertension    Parkinson's disease ([place])    Parkinson's disease ([place]) 06/04/2020   Pure hypercholesterolemia 06/04/2020    His Past Surgical History Is Significant For: Past Surgical History:  Procedure Laterality Date   APPENDECTOMY     back epidural   08/2017   BACK SURGERY     CORONARY ANGIOPLASTY WITH STENT PLACEMENT  08/19/2012   KNEE SURGERY      His Family History Is Significant For: Family History  Problem Relation Age of Onset   Diabetes Mother    Sarcoidosis Mother    Stroke Father     His Social History Is Significant For: Social History   Socioeconomic History   Marital status: Married    Spouse name: Memory Dance    Number of children: 3   Years of education: Not on file  Highest education level: Not on file  Occupational History   Occupation: Retired   Tobacco Use   Smoking status: Former    Smokeless tobacco: Never  Scientific laboratory technician Use: Never used  Substance and Sexual Activity   Alcohol use: Not Currently   Drug use: No   Sexual activity: Not on file  Other Topics Concern   Not on file  Social History Narrative   Drinks about 1 cup of coffee a day    Social Determinants of Radio broadcast assistant Strain: Not on file  Food Insecurity: Not on file  Transportation Needs: Not on file  Physical Activity: Not on file  Stress: Not on file  Social Connections: Not on file    His Allergies Are:  No Known Allergies:   His Current Medications Are:  Outpatient Encounter Medications as of 06/03/2021  Medication Sig   aspirin 81 MG chewable tablet Chew by mouth.   carbidopa-levodopa (SINEMET IR) 25-100 MG tablet Take 1.5 tablets by mouth 4 (four) times daily. At 9, 12, 3 PM and 6 PM daily   lisinopril (ZESTRIL) 10 MG tablet Take by mouth.   metoprolol succinate (TOPROL-XL) 25 MG 24 hr tablet Take by mouth.   oxybutynin (DITROPAN) 5 MG tablet TAKE ONE TABLET BY MOUTH TWICE DAILY NEEDS  APPT   pioglitazone (ACTOS) 15 MG tablet daily.   sertraline (ZOLOFT) 50 MG tablet Take 50 mg by mouth at bedtime.   simvastatin (ZOCOR) 40 MG tablet TAKE ONE TABLET BY MOUTH IN THE EVENING   rosuvastatin (CRESTOR) 20 MG tablet Take 1 tablet (20 mg total) by mouth daily.   No facility-administered encounter medications on file as of 06/03/2021.  :  Review of Systems:  Out of a complete 14 point review of systems, all are reviewed and negative with the exception of these symptoms as listed below:  Review of Systems  Neurological:        Parkinson-stable. Ok on sinemet. Has had 2 falls, balance, did not require medical attention.    Objective:  Neurological Exam  Physical Exam Physical Examination:   Vitals:   06/03/21 1306  BP: 137/78  Pulse: (!) 54    General Examination: The patient is a very pleasant 70 y.o. male in no acute distress. He appears well-developed and  well-nourished and well groomed.   HEENT: Normocephalic, atraumatic, pupils are equal, round and reactive to light, extraocular tracking is mildly impaired, face is asymmetric with fairly stable appearing left facial weakness and occasional blepharospasm and L hemifacial spasm noted.  He has moderate facial masking overall, moderate to severe hypophonia and mild intermittent dysarthria.  Neck is moderately rigid.  Airway examination reveals stable findings, mild mouth dryness, adequate dental hygiene, tongue protrudes centrally and palate elevates symmetrically.  No obvious sialorrhea.    Chest: Clear to auscultation without wheezing, rhonchi or crackles noted.   Heart: S1+S2+0, regular and normal without murmurs, rubs or gallops noted.    Abdomen: Soft, non-tender and non-distended.   Extremities: There is no pitting edema in the lower extremities today.    Skin: Warm and dry without trophic changes noted.   Musculoskeletal: exam reveals L knee pain and mild left knee swelling.  Wearing left knee brace.   Neurologically:  Mental status: The patient is awake, alert and oriented in all 4 spheres. His immediate and remote memory, attention, language skills and fund of knowledge are appropriate. There is no evidence of aphasia, agnosia, apraxia or anomia. Thought  process is linear. Mood is normal and affect is normal.  Cranial nerves II - XII are as described above under HEENT exam. Motor exam: Normal bulk, strength for age, he has a mild intermittent right upper extremity resting tremor.  No dyskinesia noted today. He has moderate difficulty with fine motor skills on the right including finger taps, hand movements and rapid alternating patting, foot taps and foot agility as well. On the left, he has overall milder findings. Sensory exam intact to light touch throughout. Overall moderate bradykinesia. He stands with mild difficulty, and pushes himself up, slightly wider stance, he has a stooped  posture, fairly stable from last time, perhaps slightly worse, mild right upper body tilt.  He has a single-point cane on the left side, walks with a limp.   Assessment and Plan:    In summary, Ronald [name] is a very pleasant 71 year old left-handed gentleman with an underlying medical history of coronary artery disease, status post stent placement, history of left-sided Bell's palsy, overweight state, degenerative back d/s, with s/p ESI, arthritis with s/p knee injections, hypertension, type 2 diabetes, and status post lumbar spine surgery in June 2019, who presents for follow-up consultation of his right-sided predominant Parkinson's disease, complicated by low back pain, knee pain, constipation and recurrent falls.  He has seen Dr. Durward Fortes for his arthritis. He has been using a cane more consistently and a knee brace. We again talked about his fall risk and the importance of fall prevention.  He was not amenable to trying a walker.  He has had physical therapy.  He has been on Sinemet 1-1/2 pills 4 times a day.  When he increased it to 2 pills 4 times a day he could not tolerate it.  He was reluctant to adding a new medication such as Nourianz.  I suggested he could increase the Sinemet which is currently 1-1/2 pills 4 times a day to 2 pills in the morning and evening around 9 AM and 9 PM and 1-1/2 pills at 1 PM and 5 PM daily.  This would be an increase of 1 pill to a total dose of 7 pills with currently 6 pills daily.  He is agreeable to trying it.  I adjusted his prescription in that regard.  We talked about the importance of constipation management and good hydration as well as staying physically active. He is encouraged to make a follow-up appointment with his orthopedic surgeon if needed.  He is advised to hydrate better with water and be proactive about constipation.  He is advised to follow-up routinely in 4 months, sooner if needed.  I answered all his questions today and he was in agreement.   I spent 30 minutes in total face-to-face time and in reviewing records during pre-charting, more than 50% of which was spent in counseling and coordination of care, reviewing test results, reviewing medications and treatment regimen and/or in discussing or reviewing the diagnosis of PD, the prognosis and treatment options. Pertinent laboratory and imaging test results that were available during this visit with the patient were reviewed by me and considered in my medical decision making (see chart for details).

## 2021-06-09 NOTE — Progress Notes (Incomplete)
Cardiology Office Note   Date:  06/09/2021   ID:  Ronald [name], DOB [birth year], MRN [id number]  PCP:  Ronald Chen, MD  Cardiologist:   Ronald [name]   No chief complaint on file.    History of Present Illness: Ronald [name] is a 70 y.o. male with diabetes, hypertension, hyperlipidemia, Parkinson's diseases and depression who is here follow-up. He initially established care 06/04/2020 at the request of Ronald Chen, MD.  Ronald [name] had an MI in 2013.  He had three stents placed at that time in the RCA and OM.  He was done well and had no angina.  He was receiving care at [facility] and elected to transfer.  He had a [employer] 02/2018 that revealed mild posterolateral ischemia and LVEF 65%.    At his last appointment, he was struggling with chronic back pain and chronic LE edema. He had an Echo 06/2020 that revealed LVEF 60-65%. He struggles with chronic back pain that limits his ability to exercise.  He liked golf but is no longer able to do so.  He had surgery on his back but still has weakness and pain.  He does try to get some exercise by walking.  He has no exertional chest pain or shortness of breath.  He also denies claudication.  He does note some muscle cramping in his thighs and pain in his left knee.  He has chronic lower extremity edema without orthopnea or PND.  He tries to elevate his legs when sitting but does not use compression socks.  He quit smoking in the early 1990s after smoking less than 1 pack/week for 20 years.  He likes to travel to [place] often but has not been able to do so lately due to COVID-19.  He had a long career as a Sports coach.  Today,  He denies any palpitations, chest pain, or shortness of breath. No lightheadedness, headaches, syncope, orthopnea, or PND. Also has no lower extremity edema or exertional symptoms.   Past Medical History:  Diagnosis Date   CAD in native artery 06/04/2020   RCA  infarct 2013.  S/p RCA and OM PCI   Diabetes mellitus type 2 in nonobese ([facility]) 06/04/2020   Diabetes mellitus without complication ([facility])    Essential hypertension 06/04/2020   Hypertension    Parkinson's disease ([facility])    Parkinson's disease ([facility]) 06/04/2020   Pure hypercholesterolemia 06/04/2020    Past Surgical History:  Procedure Laterality Date   APPENDECTOMY     back epidural   08/2017   BACK SURGERY     CORONARY ANGIOPLASTY WITH STENT PLACEMENT  08/19/2012   KNEE SURGERY       Current Outpatient Medications  Medication Sig Dispense Refill   aspirin 81 MG chewable tablet Chew by mouth.     carbidopa-levodopa (SINEMET IR) 25-100 MG tablet Take 2 pills at 9 AM and 9 PM daily, take 1-1/2 pills at 1 PM and 6 PM daily. 630 tablet 3   lisinopril (ZESTRIL) 10 MG tablet Take by mouth.     metoprolol succinate (TOPROL-XL) 25 MG 24 hr tablet Take by mouth.     oxybutynin (DITROPAN) 5 MG tablet TAKE ONE TABLET BY MOUTH TWICE DAILY NEEDS  APPT     pioglitazone (ACTOS) 15 MG tablet daily.     rosuvastatin (CRESTOR) 20 MG tablet Take 1 tablet (20 mg total) by mouth daily. 90 tablet 3   sertraline (ZOLOFT) 50 MG tablet Take 50 mg  by mouth at bedtime.     simvastatin (ZOCOR) 40 MG tablet TAKE ONE TABLET BY MOUTH IN THE EVENING     No current facility-administered medications for this visit.    Allergies:   Patient has no known allergies.    Social History:  The patient  reports that he has quit smoking. He has never used smokeless tobacco. He reports that he does not currently use alcohol. He reports that he does not use drugs.   Family History:  The patient's family history includes Diabetes in his mother; Sarcoidosis in his mother; Stroke in his father.    ROS:   Please see the history of present illness. (+) All other systems are reviewed and negative.    PHYSICAL EXAM: VS:  There were no vitals taken for this visit. , BMI There is no height or weight on file to calculate  BMI. GENERAL:  Well appearing HEENT:  Pupils equal round and reactive, fundi not visualized, oral mucosa unremarkable NECK:  No jugular venous distention, waveform within normal limits, carotid upstroke brisk and symmetric, no bruits, no thyromegaly LYMPHATICS:  No cervical adenopathy LUNGS:  Clear to auscultation bilaterally HEART:  RRR.  PMI not displaced or sustained,S1 and S2 within normal limits, no S3, no S4, no clicks, no rubs, II/VI early-peaking systolic murmur at the LUSB*** ABD:  Flat, positive bowel sounds normal in frequency in pitch, no bruits, no rebound, no guarding, no midline pulsatile mass, no hepatomegaly, no splenomegaly EXT:  2 plus pulses throughout, 2+ LE edema to the lower tibia***, no cyanosis no clubbing SKIN:  No rashes no nodules NEURO:  Cranial nerves II through XII grossly intact, motor grossly intact throughout PSYCH:  Cognitively intact, oriented to person place and time   EKG:   06/10/2021: Sinus ***. Rate *** bpm. 06/04/2020: sinus bradycardia.  Rate 58 bpm.  Prior inferior infarct.  LVH.  Echo 06/29/2020: 1. Left ventricular ejection fraction, by estimation, is 60 to 65%. The  left ventricle has normal function. The left ventricle has no regional  wall motion abnormalities. Left ventricular diastolic parameters were  normal.   2. Right ventricular systolic function is normal. The right ventricular  size is mildly enlarged. There is normal pulmonary artery systolic  pressure. The estimated right ventricular systolic pressure is 25.8 mmHg.   3. The mitral valve is normal in structure. Trivial mitral valve  regurgitation.   4. The aortic valve is tricuspid. Aortic valve regurgitation is trivial.  Mild aortic valve sclerosis is present, with no evidence of aortic valve  stenosis.   5. The inferior vena cava is normal in size with greater than 50%  respiratory variability, suggesting right atrial pressure of 3 mmHg.  Recent Labs: No results found for  requested labs within last 8760 hours.   05/06/2020: Sodium 139, potassium 5.1, BUN 23, creatinine 1.17 AST 19, ALT 10  03/25/2020: Total cholesterol 140, triglycerides 55, HDL 60, LDL 68 TSH 1.7  Lipid Panel No results found for: CHOL, TRIG, HDL, CHOLHDL, VLDL, LDLCALC, LDLDIRECT    Wt Readings from Last 3 Encounters:  06/03/21 175 lb 12.8 oz (79.7 kg)  04/21/21 177 lb (80.3 kg)  01/27/21 177 lb (80.3 kg)      ASSESSMENT AND PLAN: No problem-specific Assessment & Plan notes found for this encounter.  # CAD s/p MI:  # Hyperlipidemia:  Prior PCI of the RCA and OM.  Stable.  He did have a Lexiscan Myoview in 2019 that had evidence of ischemia.  Clinically he has no angina.  Continue secondary prevention with aspirin, metoprolol, and simvastatin.  We will hold the simvastatin for 2 weeks given his report of muscle cramps to see if this helps.  If it does, we will likely switch simvastatin to rosuvastatin.  # Essential hypertension:  BP controlled today.  He reports that it has been elevated at home at times.  Continue to monitor for now.  Continue lisinopril and metoprolol.   # Murmur:  # LE edema: Sounds like aortic valve sclerosis versus mild aortic stenosis.  We will get an echo, especially given his lower extremity edema.  Recommended elevation and compression socks.   Current medicines are reviewed at length with the patient today.  The patient does not have concerns regarding medicines.  The following changes have been made:  no change  Labs/ tests ordered today include:   No orders of the defined types were placed in this encounter.    Disposition:   FU with Tiffany C. Duke Salvia, MD, [facility] in ***1 year   I,Mathew Stumpf,acting as a scribe for Chilton Si, MD.,have documented all relevant documentation on the behalf of Chilton Si, MD,as directed by  Chilton Si, MD while in the presence of Chilton Si, MD.  ***  Signed, Tiffany C. Duke Salvia, MD,  [facility]  06/09/2021 8:47 AM     [facility]

## 2021-06-10 ENCOUNTER — Ambulatory Visit (HOSPITAL_BASED_OUTPATIENT_CLINIC_OR_DEPARTMENT_OTHER): Payer: Medicare HMO | Admitting: Cardiovascular Disease

## 2021-06-15 ENCOUNTER — Other Ambulatory Visit: Payer: Self-pay | Admitting: Cardiovascular Disease

## 2021-07-02 ENCOUNTER — Encounter (HOSPITAL_BASED_OUTPATIENT_CLINIC_OR_DEPARTMENT_OTHER): Payer: Self-pay | Admitting: Cardiovascular Disease

## 2021-07-02 ENCOUNTER — Ambulatory Visit (HOSPITAL_BASED_OUTPATIENT_CLINIC_OR_DEPARTMENT_OTHER): Payer: Medicare HMO | Admitting: Cardiovascular Disease

## 2021-07-02 ENCOUNTER — Other Ambulatory Visit: Payer: Self-pay

## 2021-07-02 VITALS — BP 110/70 | HR 58 | Ht 69.0 in | Wt 180.8 lb

## 2021-07-02 DIAGNOSIS — G20 Parkinson's disease: Secondary | ICD-10-CM

## 2021-07-02 DIAGNOSIS — I2510 Atherosclerotic heart disease of native coronary artery without angina pectoris: Secondary | ICD-10-CM

## 2021-07-02 DIAGNOSIS — E7800 Pure hypercholesterolemia, unspecified: Secondary | ICD-10-CM | POA: Diagnosis not present

## 2021-07-02 DIAGNOSIS — I10 Essential (primary) hypertension: Secondary | ICD-10-CM

## 2021-07-02 MED ORDER — LISINOPRIL 5 MG PO TABS: 5.0000 mg | ORAL_TABLET | Freq: Every day | ORAL | 3 refills | Status: DC

## 2021-07-02 NOTE — Patient Instructions (Addendum)
Medication Instructions:  DECREASE YOUR LISINOPRIL TO 5 MG DAILY   *If you need a refill on your cardiac medications before your next appointment, please call your pharmacy*  Lab Work: NONE   Testing/Procedures: NONE   Follow-Up: At [facility], you and your health needs are our priority.  As part of our continuing mission to provide you with exceptional heart care, we have created designated Provider Care Teams.  These Care Teams include your primary Cardiologist (physician) and Advanced Practice Providers (APPs -  Physician Assistants and Nurse Practitioners) who all work together to provide you with the care you need, when you need it.  We recommend signing up for the patient portal called "MyChart".  Sign up information is provided on this After Visit Summary.  MyChart is used to connect with patients for Virtual Visits (Telemedicine).  Patients are able to view lab/test results, encounter notes, upcoming appointments, etc.  Non-urgent messages can be sent to your provider as well.   To learn more about what you can do with MyChart, go to [email or web address].    Your next appointment:   12 month(s)  The format for your next appointment:   In Person  Provider:   Chilton Si, MD  Your physician recommends that you schedule a follow-up appointment in: 3 MONTHS WITH PHARM D   Other Instructions  MONITOR AND LOG YOUR BLOOD PRESSURE AT HOME, BRING READINGS TO YOUR FOLLOW UP

## 2021-07-02 NOTE — Progress Notes (Signed)
Cardiology Office Note   Date:  07/02/2021   ID:  Ladarien Beeks, DOB 29-Dec-1950, MRN [id number]  PCP:  Woodroe Chen, MD  Cardiologist:   Chilton Si, MD   No chief complaint on file.    History of Present Illness: Jamill Wetmore is a 70 y.o. male with CAD s/p PCI, diabetes, hypertension, hyperlipidemia, Parkinson's diseases and depression who is here for follow up. Mr. Grover had an MI in 2013.  He had three stents placed at that time in the RCA and OM.  He has done well and has no angina.  He was receiving care at [facility] and elected to transfer.  He had a [employer] 02/2018 that revealed mild posterolateral ischemia and LVEF 65%.  He struggles with chronic back pain that limits his ability to exercise.  He liked golf but is no longer able to do so.  He had surgery on his back but still has weakness and pain.  He does try to get some exercise by walking.  He has no exertional chest pain or shortness of breath.  He also denies claudication.  He does note some muscle cramping in his thighs and pain in his left knee.  He has chronic lower extremity edema without orthopnea or PND.  He tries to elevate his legs when sitting but does not use compression socks.  He quit smoking in the early 1990s after smoking less than 1 pack/week for 20 years.  He likes to travel to [place] often but has not been able to do so lately due to COVID-19.  He had a long career as a Sports coach.  Mr. Mcneel had myalgias on simvastatin and was switched to rosuvastatin.  This helped his symptoms.  Lately he is struggled with soreness in his left shoulder is attributable to arthritis and chronic back pain.  He still goes to the [facility] twice per week and does water aerobics.  He has no exertional chest pain or shortness of breath.  He does sometimes have lower extremity edema that improves with elevation of his legs.  He denies orthopnea or PND.  His blood pressure  at home has been well-controlled.  However he struggles with his balance.  He notes that he symptoms gets lightheaded when he first stands up and then stumbles backwards and falls.  He had an echo 06/2020 that revealed LVEF 60 to 65% with normal diastolic function and aortic valve sclerosis without stenosis.  Past Medical History:  Diagnosis Date   CAD in native artery 06/04/2020   RCA infarct 2013.  S/p RCA and OM PCI   Diabetes mellitus type 2 in nonobese (HCC) 06/04/2020   Diabetes mellitus without complication (HCC)    Essential hypertension 06/04/2020   Hypertension    Parkinson's disease (HCC)    Parkinson's disease (HCC) 06/04/2020   Pure hypercholesterolemia 06/04/2020    Past Surgical History:  Procedure Laterality Date   APPENDECTOMY     back epidural   08/2017   BACK SURGERY     CORONARY ANGIOPLASTY WITH STENT PLACEMENT  08/19/2012   KNEE SURGERY       Current Outpatient Medications  Medication Sig Dispense Refill   aspirin 81 MG chewable tablet Chew by mouth.     carbidopa-levodopa (SINEMET IR) 25-100 MG tablet Take 2 pills at 9 AM and 9 PM daily, take 1-1/2 pills at 1 PM and 6 PM daily. 630 tablet 3   metoprolol succinate (TOPROL-XL) 25 MG 24 hr  tablet Take by mouth.     oxybutynin (DITROPAN) 5 MG tablet TAKE ONE TABLET BY MOUTH TWICE DAILY NEEDS  APPT     pioglitazone (ACTOS) 15 MG tablet daily.     rosuvastatin (CRESTOR) 20 MG tablet Take 1 tablet by mouth once daily 30 tablet 1   rosuvastatin (CRESTOR) 20 MG tablet Take 1 tablet by mouth at bedtime.     sertraline (ZOLOFT) 50 MG tablet Take 50 mg by mouth at bedtime.     tadalafil (CIALIS) 5 MG tablet Take 5 mg by mouth daily.     lisinopril (ZESTRIL) 5 MG tablet Take 1 tablet (5 mg total) by mouth daily. 90 tablet 3   No current facility-administered medications for this visit.    Allergies:   Patient has no known allergies.    Social History:  The patient  reports that he has quit smoking. He has never used  smokeless tobacco. He reports that he does not currently use alcohol. He reports that he does not use drugs.   Family History:  The patient's family history includes Diabetes in his mother; Sarcoidosis in his mother; Stroke in his father.    ROS:  Please see the history of present illness.   Otherwise, review of systems are positive for none.   All other systems are reviewed and negative.    PHYSICAL EXAM: VS:  BP 110/70 (BP Location: Left Arm, Patient Position: Sitting, Cuff Size: Normal)   Pulse (!) 58   Ht 5\' 9"  (1.753 m)   Wt 180 lb 12.8 oz (82 kg)   BMI 26.70 kg/m  , BMI Body mass index is 26.7 kg/m. GENERAL:  Well appearing.  Voice soft.  HEENT:  Pupils equal round and reactive, fundi not visualized, oral mucosa unremarkable NECK:  No jugular venous distention, waveform within normal limits, carotid upstroke brisk and symmetric, no bruits LUNGS:  Clear to auscultation bilaterally HEART:  RRR.  PMI not displaced or sustained,S1 and S2 within normal limits, no S3, no S4, no clicks, no rubs, II/VI early-peaking systolic murmur at the LUSB ABD:  Flat, positive bowel sounds normal in frequency in pitch, no bruits, no rebound, no guarding, no midline pulsatile mass, no hepatomegaly, no splenomegaly EXT:  2 plus pulses throughout, trace LE edema to the lower tibia bilaterally.  No cyanosis no clubbing SKIN:  No rashes no nodules NEURO:  Cranial nerves II through XII grossly intact, motor grossly intact throughout PSYCH:  Cognitively intact, oriented to person place and time   EKG:  EKG is ordered today. The ekg ordered today demonstrates sinus bradycardia.  Rate 58 bpm.  Prior inferior infarct.  LVH. 07/02/21: Sinus bradycardia.  Rate 58 bpm.  PAC.    Recent Labs: No results found for requested labs within last 8760 hours.   05/06/2020: Sodium 139, potassium 5.1, BUN 23, creatinine 1.17 AST 19, ALT 10  03/25/2020: Total cholesterol 140, triglycerides 55, HDL 60, LDL 68 TSH  1.7  Lipid Panel No results found for: CHOL, TRIG, HDL, CHOLHDL, VLDL, LDLCALC, LDLDIRECT    Wt Readings from Last 3 Encounters:  07/02/21 180 lb 12.8 oz (82 kg)  06/03/21 175 lb 12.8 oz (79.7 kg)  04/21/21 177 lb (80.3 kg)      ASSESSMENT AND PLAN:  # CAD s/p MI:  # Hyperlipidemia:  Prior PCI of the RCA and OM.  Stable.  He did have a Lexiscan Myoview in 2019 that had no evidence of ischemia.  Myalgias improved with switching simvastatin  to rosuvastatin and his lipids are well-controlled.  Continue aspirin and metoprolol.  He is currently doing well and has no angina.  # Essential hypertension:  BP controlled today.  However his blood pressure has been running low and he has had lightheadedness upon standing as well as falls.  He does have Parkinson's disease as well.  We will reduce the lisinopril to 5 mg and continue metoprolol.  I have asked him to track his blood pressures and bring to follow-up.  # Murmur:  # LE edema: Aortic valve sclerosis noted on echo.  His IVC is not dilated.  I suspect that his edema is more related to venous insufficiency.  Recommend compression socks.   Current medicines are reviewed at length with the patient today.  The patient does not have concerns regarding medicines.  The following changes have been made: Reduce lisinopril as above.  Labs/ tests ordered today include:   Orders Placed This Encounter  Procedures   AMB Referral to [facility]   EKG 12-Lead      Disposition:   FU with  C. Duke Salvia, MD, [facility] in 1 year.  PharmD in 2 months.     Signed,  C. Duke Salvia, MD, [facility]  07/02/2021 12:56 PM    [facility]

## 2021-07-04 ENCOUNTER — Encounter (HOSPITAL_BASED_OUTPATIENT_CLINIC_OR_DEPARTMENT_OTHER): Payer: Self-pay | Admitting: Cardiovascular Disease

## 2021-07-27 ENCOUNTER — Ambulatory Visit (HOSPITAL_BASED_OUTPATIENT_CLINIC_OR_DEPARTMENT_OTHER): Payer: Medicare HMO | Admitting: Cardiovascular Disease

## 2021-08-23 ENCOUNTER — Other Ambulatory Visit: Payer: Self-pay | Admitting: Cardiovascular Disease

## 2021-10-04 ENCOUNTER — Encounter: Payer: Self-pay | Admitting: Pharmacist Clinician (PhC)/ Clinical Pharmacy Specialist

## 2021-10-04 ENCOUNTER — Other Ambulatory Visit: Payer: Self-pay

## 2021-10-04 ENCOUNTER — Ambulatory Visit: Payer: Medicare HMO | Admitting: Pharmacist Clinician (PhC)/ Clinical Pharmacy Specialist

## 2021-10-04 DIAGNOSIS — I10 Essential (primary) hypertension: Secondary | ICD-10-CM

## 2021-10-04 MED ORDER — MIDODRINE HCL 2.5 MG PO TABS: ORAL_TABLET | ORAL | 1 refills | Status: DC

## 2021-10-04 NOTE — Progress Notes (Signed)
10/06/2021 Ronald [name] May 10, 1951 [id number]   HPI:  Ronald [name] is a 71 y.o. male patient of Dr Oval Linsey, with a [place] below who presents today for hypertension clinic evaluation.  He was seen by Dr. Oval Linsey about 3 months ago, at which time his pressure was 110/70 while on lisinopril 10 mg daily.  He was diagnosed with Parkinson's disease in 2014 and has been stable, but now uses a cane for balance.  Dr. Oval Linsey decreased the lisinopril to 5 mg daily in hopes of decreasing the lightheadedness he notes with positional changes.    Today he returns for follow up.  He has not been checking home BP readings regularly, but did check before coming in this afternoon, was at 164/89.  Reports no change in the dizziness associated with positional changes.    Past Medical History: Parkinson's disease Now using cane d/t balance issues  CAD 2013 MI w/stent x 3 RCA and OM  hyperlipidemia 7/22 LDL 51 on rosuvastatin 20  DM2 Currently at 6.2, no medications        Blood Pressure Goal:  130/80  Current Medications:  lisinopril 5 mg qd  Family Hx: mother had DM, sarcoidosis,  died at 37   father had stroke, died at 61; siblings mostly helathy no heart disaseas;  3 daughters healthy  Social Hx: no tobacco, occasional wine; coffee 1/2 cup decaf in the am  Diet:  wilfe does most cooking; occasionally adds salt; good mix of meats, some vegetables, mostly fresh  Exercise: no PT for parkinson's; exercise bike 2-3 times per week  Home BP readings: none with him   Intolerances: nkda  Labs: 11/22:  Na 143, K 4.6, Glu 89, BUN 18, SCr 1.08, GFR 73   Wt Readings from Last 3 Encounters:  10/04/21 176 lb (79.8 kg)  07/02/21 180 lb 12.8 oz (82 kg)  06/03/21 175 lb 12.8 oz (79.7 kg)   BP Readings from Last 3 Encounters:  10/04/21 (!) 152/88  07/02/21 110/70  06/03/21 137/78   Pulse Readings from Last 3 Encounters:  10/04/21 64  07/02/21 (!) 58  06/03/21 (!) 54    Current Outpatient  Medications  Medication Sig Dispense Refill   aspirin 81 MG chewable tablet Chew by mouth.     carbidopa-levodopa (SINEMET IR) 25-100 MG tablet Take 2 pills at 9 AM and 9 PM daily, take 1-1/2 pills at 1 PM and 6 PM daily. (Patient taking differently: 2 tablets. Take 2 pills at 9 AM and 9 PM daily, take 1-1/2 pills at 1 PM and 6 PM daily.) 630 tablet 3   lisinopril (ZESTRIL) 5 MG tablet Take 1 tablet (5 mg total) by mouth daily. 90 tablet 3   metoprolol succinate (TOPROL-XL) 25 MG 24 hr tablet Take by mouth.     midodrine (PROAMATINE) 2.5 MG tablet Take 1 tablet by mouth daily in the morning to help with low BP 30 tablet 1   naproxen sodium (ALEVE) 220 MG tablet Take 1 tablet by mouth as needed.     oxybutynin (DITROPAN) 5 MG tablet TAKE ONE TABLET BY MOUTH TWICE DAILY NEEDS  APPT     pioglitazone (ACTOS) 15 MG tablet daily.     rosuvastatin (CRESTOR) 20 MG tablet Take 1 tablet by mouth once daily 90 tablet 3   sertraline (ZOLOFT) 50 MG tablet Take 50 mg by mouth at bedtime.     tadalafil (CIALIS) 5 MG tablet Take 5 mg by mouth daily.  No current facility-administered medications for this visit.    No Known Allergies  Past Medical History:  Diagnosis Date   CAD in native artery 06/04/2020   RCA infarct 2013.  S/p RCA and OM PCI   Diabetes mellitus type 2 in nonobese ([place]) 06/04/2020   Diabetes mellitus without complication ([place])    Essential hypertension 06/04/2020   Hypertension    Parkinson's disease ([place])    Parkinson's disease ([place]) 06/04/2020   Pure hypercholesterolemia 06/04/2020    Blood pressure (!) 152/88, pulse 64, resp. rate 17, height 5\' 9"  (1.753 m), weight 176 lb (79.8 kg), SpO2 100 %. Orthostatics:  at standing dropped to 110/66, then after 1 minute back to 122/68  Essential hypertension Patient with essential hypertension with overlying orthostatic hypotension due to neurologic disease.  Discussed challenges of hypotension and getting pressure to stabilize without  going too high at other times.  Will have patient do trial of midodrine, starting with 2.5 mg in the mornings with breakfast.  He will monitor home BP readings more closely and we will see him back in 3 weeks.  If midodrine helps with OH, will just need to monitor BP elevations later in the day.     Tommy Medal PharmD CPP [facility] [address] [place], [place] [address] [phone]

## 2021-10-04 NOTE — Progress Notes (Signed)
10/04/2021 Ronald [name] 06-Nov-1950 [id number]   HPI:  Ronald [name] is a 71 y.o. male patient of Dr Duke Salvia, with a PMH below who presents today for hypertension clinic evaluation.  He was seen by Dr. Duke Salvia in October, at which time his BP was noted to be 110/70.  He was taking lisinopril 10 mg and noted to have difficulty with balance and lightheadedness with positional changes.  She decreased the lisinopril to 5 mg daily and monitor home blood pressure readings.    Today he returns for follow up.  Has checked home BP a few times in the past couple of months, but has no readings with him today.    Past Medical History:                   Blood Pressure Goal:  130/80  Current Medications:  Family Hx:  Social Hx:   Diet:   Exercise:   Home BP readings:   Intolerances:   Labs:    Wt Readings from Last 3 Encounters:  10/04/21 176 lb (79.8 kg)  07/02/21 180 lb 12.8 oz (82 kg)  06/03/21 175 lb 12.8 oz (79.7 kg)   BP Readings from Last 3 Encounters:  10/04/21 (!) 152/88  07/02/21 110/70  06/03/21 137/78   Pulse Readings from Last 3 Encounters:  10/04/21 64  07/02/21 (!) 58  06/03/21 (!) 54    Current Outpatient Medications  Medication Sig Dispense Refill   aspirin 81 MG chewable tablet Chew by mouth.     carbidopa-levodopa (SINEMET IR) 25-100 MG tablet Take 2 pills at 9 AM and 9 PM daily, take 1-1/2 pills at 1 PM and 6 PM daily. (Patient taking differently: 2 tablets. Take 2 pills at 9 AM and 9 PM daily, take 1-1/2 pills at 1 PM and 6 PM daily.) 630 tablet 3   lisinopril (ZESTRIL) 5 MG tablet Take 1 tablet (5 mg total) by mouth daily. 90 tablet 3   metoprolol succinate (TOPROL-XL) 25 MG 24 hr tablet Take by mouth.     midodrine (PROAMATINE) 2.5 MG tablet Take 1 tablet by mouth daily in the morning to help with low BP 30 tablet 1   naproxen sodium (ALEVE) 220 MG tablet Take 1 tablet by mouth as needed.     oxybutynin (DITROPAN) 5 MG tablet TAKE ONE  TABLET BY MOUTH TWICE DAILY NEEDS  APPT     pioglitazone (ACTOS) 15 MG tablet daily.     rosuvastatin (CRESTOR) 20 MG tablet Take 1 tablet by mouth once daily 90 tablet 3   sertraline (ZOLOFT) 50 MG tablet Take 50 mg by mouth at bedtime.     tadalafil (CIALIS) 5 MG tablet Take 5 mg by mouth daily.     No current facility-administered medications for this visit.    No Known Allergies  Past Medical History:  Diagnosis Date   CAD in native artery 06/04/2020   RCA infarct 2013.  S/p RCA and OM PCI   Diabetes mellitus type 2 in nonobese (HCC) 06/04/2020   Diabetes mellitus without complication (HCC)    Essential hypertension 06/04/2020   Hypertension    Parkinson's disease (HCC)    Parkinson's disease (HCC) 06/04/2020   Pure hypercholesterolemia 06/04/2020    Blood pressure (!) 152/88, pulse 64, resp. rate 17, height 5\' 9"  (1.753 m), weight 176 lb (79.8 kg), SpO2 100 %.  No problem-specific Assessment & Plan notes found for this encounter.   PharmD CPP [facility] [address] [place], [place] [address] [phone]

## 2021-10-04 NOTE — Patient Instructions (Signed)
Return for a a follow up appointment February 9 at 2 pm  Check your blood pressure at home daily and keep record of the readings. - take your BP about an hour after breakfast, then again in the late afternoon or evening.  Keep track as to whether the medication makes it easier for you to get up without having balance issues or dizziness.   Take your BP meds as follows:  Take midodrine 2.5 mg once daily in the mornings with breakfast.   Bring all of your meds, your BP cuff and your record of home blood pressures to your next appointment.  Exercise as youre able, try to walk approximately 30 minutes per day.  Keep salt intake to a minimum, especially watch canned and prepared boxed foods.  Eat more fresh fruits and vegetables and fewer canned items.  Avoid eating in fast food restaurants.    HOW TO TAKE YOUR BLOOD PRESSURE: Rest 5 minutes before taking your blood pressure.  Dont smoke or drink caffeinated beverages for at least 30 minutes before. Take your blood pressure before (not after) you eat. Sit comfortably with your back supported and both feet on the floor (dont cross your legs). Elevate your arm to heart level on a table or a desk. Use the proper sized cuff. It should fit smoothly and snugly around your bare upper arm. There should be enough room to slip a fingertip under the cuff. The bottom edge of the cuff should be 1 inch above the crease of the elbow. Ideally, take 3 measurements at one sitting and record the average.

## 2021-10-06 ENCOUNTER — Encounter: Payer: Self-pay | Admitting: Pharmacist Clinician (PhC)/ Clinical Pharmacy Specialist

## 2021-10-06 NOTE — Assessment & Plan Note (Signed)
Patient with essential hypertension with overlying orthostatic hypotension due to neurologic disease.  Discussed challenges of hypotension and getting pressure to stabilize without going too high at other times.  Will have patient do trial of midodrine, starting with 2.5 mg in the mornings with breakfast.  He will monitor home BP readings more closely and we will see him back in 3 weeks.  If midodrine helps with OH, will just need to monitor BP elevations later in the day.

## 2021-10-07 ENCOUNTER — Other Ambulatory Visit: Payer: Self-pay

## 2021-10-07 ENCOUNTER — Encounter: Payer: Self-pay | Admitting: Neurology

## 2021-10-07 ENCOUNTER — Ambulatory Visit: Payer: Medicare HMO | Admitting: Neurology

## 2021-10-07 VITALS — BP 173/78 | HR 56 | Ht 69.0 in | Wt 180.0 lb

## 2021-10-07 DIAGNOSIS — G8929 Other chronic pain: Secondary | ICD-10-CM

## 2021-10-07 DIAGNOSIS — G20 Parkinson's disease: Secondary | ICD-10-CM

## 2021-10-07 DIAGNOSIS — M25562 Pain in left knee: Secondary | ICD-10-CM

## 2021-10-07 DIAGNOSIS — Z9181 History of falling: Secondary | ICD-10-CM | POA: Diagnosis not present

## 2021-10-07 MED ORDER — CARBIDOPA-LEVODOPA 25-100 MG PO TABS: 1.5000 | ORAL_TABLET | Freq: Every day | ORAL | 3 refills | Status: DC

## 2021-10-07 NOTE — Progress Notes (Signed)
Subjective:    Patient ID: Ronald [name] is a 71 y.o. male.  HPI    Interim history:   Ronald [name] is a 71 year old left-handed gentleman with an underlying medical history of diabetes, Bell's palsy some 10+ years ago, overweight state, CAD (s/p MI in 2013, s/p 3 stents), and hypertension who presents for follow-up consultation of his right-sided predominant Parkinson's disease, complicated by back pain, knee pain, history of fall and constipation.  The patient is unaccompanied today.  I last saw him on 06/03/2021, at which time he reported significant left knee pain.  He was utilizing a prescription knee brace.  He was using a cane.  He had a recent fall, landed on his back but did not hit his head and sustained no serious injuries thankfully.  Constipation was under decent control.  He was advised to increase his Sinemet from 1-1/2 pills 4 times daily to 2 pills in the morning and 2 pills in the evening, maintain 1-1/2 pills for the midday doses.   Today, 10/07/2021: He reports that increasing the Sinemet to 4 times a day caused his joint pain to get worse particularly left knee and left ankle.  For some reason he is taking 2 pills 3 times daily of the Sinemet.  He reports that he has never taken 1-1/2 pills 4 times a day.  Looking back in our records, in May 2022 he was on 2 pills 3 times daily but was advised to split the doses to 1-1/2 pills 4 times a day and when he returned in September 2022 I suggested he increase the morning and evening dose to 2 pills and keep the 2 midday doses of 1-1/2 pills.  For some reason he does not recall taking 1-1/2 pills 4 times a day and reports that he never actually took 1-1/2 pills 4 times a day.  At any rate, currently, he takes 2 pills 3 times daily.  First dose is around 9 AM.  He is unsteady on his feet especially if he stands up after being seated for a long time.  He has a tendency to fall backwards.  He has fallen a few times and bumped his elbow but has  not had any serious injuries thankfully.  He uses a cane.  He has ongoing left knee pain.  He also has left shoulder crepitations and pain.  He feels that both his shoulder and knee are equally bad.  He sees Dr. Durward Fortes.  He went to [place] to see his new grandson in November.  He shares family pictures today including from his new grandbaby, [name].  His cardiologist recently started him on low-dose ProAmatine 2.5 mg strength.  He started 2 days ago.  His blood pressure currently is a little up.  He is supposed to keep a log and checks his blood pressure twice daily.  The patient's allergies, current medications, family history, past medical history, past social history, past surgical history and problem list were reviewed and updated as appropriate.    Previously (copied from previous notes for reference):   I saw him on 01/27/2021, at which time he reported doing fairly well, he had ongoing issues with knee pain and low back pain.  He reported having fallen.  He had not used a walker and he was not keen on starting to use a walker.  We talked about potentially utilizing Nourianz.     I saw him on 07/30/2020, at which time he reported feeling fairly stable.  He was  taking Sinemet 2 pills 3 times daily.  He was trying to stay active and go to the gym about twice a week.  He reported occasional coughing when drinking water but did not want to seek formal evaluation with speech therapy or swallow study.  He had noticed some swelling around his ankles.  He was switched by his cardiologist from Zocor to Crestor. He was advised to continue with generic Sinemet 2 pills 3 times daily.   I saw him on 01/28/2020, at which time he reported ongoing issues with intermittent constipation.  He had fallen a couple of times but thankfully without any major injuries.  We talked about the importance of fall prevention and constipation control.  He was not able to tolerate Sinemet at 2 pills 4 times daily and had scaled  back to 2 pills 3 times daily.  He was advised to continue with this. He was advised to use his cane consistently.   I saw him on 09/30/2019, at which time he reported that his balance was worse and he had 2 falls because his left knee gave out.  He had arthritis issues with the knees and is seen orthopedics.  He had seen neurosurgery in [place] in September 2020.  He felt that overall his slowness was worse.  He was advised to increase his Sinemet to 2 pills 4 times daily from tid.      I saw him on 05/30/2019, at which time he reported ongoing problems with his low back pain.  He had tried medication including narcotic pain medication, muscle relaxer and gabapentin.  He was supposed to have a neurosurgical evaluation soon.  He had problems with constipation.  He was still taking Sinemet 2 pills 3 times daily with reasonably good results.  He also reported overall feeling more stressed.  He was advised to be more proactive about constipation issues and try to be more active physically with the help of a recumbent stationary bike if possible.   I saw him on 11/27/2018, at which time he was complaining of low back pain.  He had lumbar spine x-rays in November 2019 which showed some degenerative changes.  He had received a lumbar spine injection under Dr. Durward Fortes in December which he felt was not that helpful.  He felt that the back pain was enough to limit his exercise.  He had not pursued his hobby of making pens very much either.  He was advised to continue with Sinemet 2 pills 3 times daily.  He was advised to try gabapentin low dose with gradual increase to up to 100 mg 3 times daily.    I saw him on 05/29/2018, at which time he reported having done well after interim back surgery on 03/06/2018. He also had radiofrequency surgery prior to that. He reported occasional dizziness and nausea, some loss of appetite. I suggested we change his Sinemet from 2 pills 3 times a day to 1-1/2 pills 4 times a day.  He reported falls. He was physically quite active, was going to the [facility] His falls primarily occurred when he was turning.    11/02/2017, at which time he reported more back pain. He had undergone an injection in December 2018 but had found no relief. [place]. He was active physically but had to take a break because of back pain. He was taking Mobic for back pain. He had a slow reduction and his hemoglobin and red cell counts and I counseled him on the daily use  of meloxicam.     I saw him on 05/02/2017, at which time he was complaining of lower back pain. He had seen orthopedics for this. He also had right knee pain, status post injection. He had not fallen thankfully. He was taking Sinemet 2 pills 3 times a day.   I saw him on 10/31/2016, at which time he reported doing okay, no recent new symptoms, in particular no significant memory loss or mood disorder constipation. He did have an episode of chest pain recently. He saw his cardiologist. He felt to be a little dehydrated and blood pressure was on the lower end of normal. He was noted to have some orthostatic hypotension. He was started on northera 100 mg tid, but had not filled it yet. He was trying to stay active and going to the [facility] 3 times a week. He was also interested in pursuing boxing classes for PD. He had lost a little bit of weight and reported some loss of appetite. I suggested he continue with Sinemet 2 pills 3 times a day. Advised to change positions slowly and be mindful of his blood pressure dropping potentially with sudden changes of position and he was reminded to stay well-hydrated.   I first met him on 04/27/2016 at the request of his primary care physician, at which time he reported a prior diagnosis of Parkinson's disease in 2014 and symptoms dating back to early 2014. He was exercising regularly, he was doing well on symptomatic treatment with Sinemet and I suggested he continue with the medication, at 2 pills 3  times a day. He was reminded to stay active physically and mentally and stable hydrated and well-nourished.    04/27/2016: He was diagnosed with Parkinson's disease in 2014. Symptoms date back to beginning of 2014, and started with speech changes, was noted to dragging his feet.  He has been on Sinemet for the past 3 years, with gradual increase, currently 2 pills in the morning, 2 in the afternoon and 1 at night, was asked to reduce it, He is not sure why, seems to tolerate it well and believes it has helped. He is wondering however if there is any new medication available. He has not been on a dopamine agonist before from what I understand, no Azilect, no other MAO B inhibitor either.  He exercises regularly and is enrolled in the PD spinning class at 2 different [employer]. Lives with his wife of 46 years in [place], and they have 3 grown daughters, 2 GC. He is a nonsmoker, drinks alcohol infrequently, is a retired Geophysicist/field seismologist.  I reviewed your office note from 02/08/2016. He used to see Dr. Marijean Bravo, neurologist out of [facility] but Dr. Marijean Bravo retired. He then saw another neurologist in the same office but she left. Prior office records from his previous neurologists are not available for my review today. We will request office records from Dr. Barbera Setters office, patient signed a ROI form today and is agreeable.   He denies any major memory or mood issues or sleep issues, does snore some, but no apneas reported, no hx of RBD. No FHx of PD.  He varies his C/L dose, 1st dose 6-9 AM, second dose around 12 or 1 PM and last dose around 8 PM.    He also plays golf, once or twice a week. He tries to stay well-hydrated, may not always drink enough water however.  His Past Medical History Is Significant For: Past Medical History:  Diagnosis Date   CAD in  native artery 06/04/2020   RCA infarct 2013.  S/p RCA and OM PCI   Diabetes mellitus type 2 in nonobese ([facility]) 06/04/2020   Diabetes mellitus without complication  ([place])    Essential hypertension 06/04/2020   Hypertension    Parkinson's disease ([place])    Parkinson's disease ([place]) 06/04/2020   Pure hypercholesterolemia 06/04/2020    His Past Surgical History Is Significant For: Past Surgical History:  Procedure Laterality Date   APPENDECTOMY     back epidural   08/2017   BACK SURGERY     CORONARY ANGIOPLASTY WITH STENT PLACEMENT  08/19/2012   KNEE SURGERY      His Family History Is Significant For: Family History  Problem Relation Age of Onset   Diabetes Mother    Sarcoidosis Mother    Stroke Father    Parkinson's disease Neg Hx     His Social History Is Significant For: Social History   Socioeconomic History   Marital status: Married    Spouse name: Memory Dance    Number of children: 3   Years of education: Not on file   Highest education level: Not on file  Occupational History   Occupation: Retired   Tobacco Use   Smoking status: Former   Smokeless tobacco: Never  Scientific laboratory technician Use: Never used  Substance and Sexual Activity   Alcohol use: Yes    Comment: occ   Drug use: No   Sexual activity: Not on file  Other Topics Concern   Not on file  Social History Narrative   Drinks about 1 cup of coffee a day    Social Determinants of Radio broadcast assistant Strain: Not on file  Food Insecurity: Not on file  Transportation Needs: Not on file  Physical Activity: Not on file  Stress: Not on file  Social Connections: Not on file   His Allergies Are:  No Known Allergies:   His Current Medications Are:  Outpatient Encounter Medications as of 10/07/2021  Medication Sig   aspirin 81 MG chewable tablet Chew by mouth.   lisinopril (ZESTRIL) 5 MG tablet Take 1 tablet (5 mg total) by mouth daily.   metoprolol succinate (TOPROL-XL) 25 MG 24 hr tablet Take by mouth.   midodrine (PROAMATINE) 2.5 MG tablet Take 1 tablet by mouth daily in the morning to help with low BP   naproxen sodium (ALEVE) 220 MG tablet Take 1 tablet by  mouth as needed.   oxybutynin (DITROPAN) 5 MG tablet TAKE ONE TABLET BY MOUTH TWICE DAILY NEEDS  APPT   pioglitazone (ACTOS) 15 MG tablet daily.   rosuvastatin (CRESTOR) 20 MG tablet Take 1 tablet by mouth once daily   sertraline (ZOLOFT) 50 MG tablet Take 50 mg by mouth at bedtime.   tadalafil (CIALIS) 5 MG tablet Take 5 mg by mouth daily.   [DISCONTINUED] carbidopa-levodopa (SINEMET IR) 25-100 MG tablet Take 2 pills at 9 AM and 9 PM daily, take 1-1/2 pills at 1 PM and 6 PM daily. (Patient taking differently: 2 tablets. Take 2 pills at 9 AM and 9 PM daily, take 1-1/2 pills at 1 PM and 6 PM daily.)   carbidopa-levodopa (SINEMET IR) 25-100 MG tablet Take 1.5 tablets by mouth 5 (five) times daily. Take 1-1/2 pills at 9 AM, 12, 3 PM, 6 PM and 9 PM daily.   No facility-administered encounter medications on file as of 10/07/2021.  :  Review of Systems:  Out of a complete 14 point review of  systems, all are reviewed and negative with the exception of these symptoms as listed below:   Review of Systems  Neurological:        Pt is here for follow with Parkinson's . Pt states his Carbidopa-levodopa dosage was increased he had more joint pain . Pt states he is having numbness in his finger tips pt states in his left hand . Pt states he has fallen at least 4 times in the last month .   Objective:  Neurological Exam  Physical Exam Physical Examination:   Vitals:   10/07/21 1315  BP: (!) 173/78  Pulse: (!) 56    General Examination: The patient is a very pleasant 71 y.o. male in no acute distress. He appears well-developed and well-nourished and well groomed.   HEENT: Normocephalic, atraumatic, pupils are equal, round and reactive to light, extraocular tracking is mildly impaired, face is asymmetric with fairly stable appearing left facial weakness and occasional blepharospasm and L hemifacial spasm noted.  He has moderate facial masking overall, moderate to severe hypophonia and mild  intermittent dysarthria.  Neck is moderately rigid.  Airway examination reveals stable findings, mild mouth dryness, adequate dental hygiene, tongue protrudes centrally and palate elevates symmetrically.  No obvious sialorrhea.    Chest: Clear to auscultation without wheezing, rhonchi or crackles noted.   Heart: S1+S2+0, regular and normal without murmurs, rubs or gallops noted.    Abdomen: Soft, non-tender and non-distended.   Extremities: There is no pitting edema in the lower extremities today.    Skin: Warm and dry without trophic changes noted.   Musculoskeletal: exam reveals L knee pain and mild left knee swelling.  Significant crepitation left shoulder.   Neurologically:  Mental status: The patient is awake, alert and oriented in all 4 spheres. His immediate and remote memory, attention, language skills and fund of knowledge are appropriate. There is no evidence of aphasia, agnosia, apraxia or anomia. Thought process is linear. Mood is normal and affect is normal.  Cranial nerves II - XII are as described above under HEENT exam. Motor exam: Normal bulk, strength for age, he has a mild intermittent right upper extremity resting tremor.  No dyskinesia noted today. He has moderate difficulty with fine motor skills on the right including finger taps, hand movements and rapid alternating patting, foot taps and foot agility as well. On the left, he has overall milder findings. Sensory exam intact to light touch throughout. Overall moderate bradykinesia. Gait, station, balance: He stands with mild difficulty, and pushes himself up, slightly wider stance, he has a stooped posture, fairly stable from last time, perhaps slightly worse, mild right upper body tilt.  He has a single-point cane on the left side, walks with a limp.   Assessment and Plan:    In summary, Ronald [name] is a very pleasant 71 year old left-handed gentleman with an underlying medical history of coronary artery disease,  status post stent placement, history of left-sided Bell's palsy, overweight state, degenerative back d/s, with s/p ESI, arthritis with s/p knee injections, hypertension, type 2 diabetes, and status post lumbar spine surgery in June 2019, who presents for follow-up consultation of his right-sided predominant Parkinson's disease, complicated by low back pain, knee pain, constipation and recurrent falls. He has seen Dr. Durward Fortes for his arthritis. He has been using a cane more consistently and a knee brace as needed. We again talked about his fall risk and the importance of fall prevention.  He was not amenable to trying a walker.  He has had physical therapy.  He has been on Sinemet, currently 2 pills 3 times daily, was supposed to try 2 pills in the morning and evening and 1-1/2 pills twice during the midday.  He reports that he has never actually been on 1/2 pills 4 times a day although we did change this regimen in May 2022.  Of note when he increased Sinemet to 2 pills 4 times a day he could not tolerate it.  He was reluctant to add a new medication such as Nourianz, we discussed this before.  Today, I suggested he change Sinemet to 1-1/2 pills 5 times a day, starting at 9 AM 3 hourly.  It may help keep his blood pressure more steady during the day.  He is encouraged to continue to keep a log as advised by his cardiologist.  He recently started ProAmatine about 2 days ago.  He is advised to follow-up in this clinic in about 4 months, sooner if needed.  I answered all his questions today and he was in agreement.  I spent 30 minutes in total face-to-face time and in reviewing records during pre-charting, more than 50% of which was spent in counseling and coordination of care, reviewing test results, reviewing medications and treatment regimen and/or in discussing or reviewing the diagnosis of PD, the prognosis and treatment options. Pertinent laboratory and imaging test results that were available during this  visit with the patient were reviewed by me and considered in my medical decision making (see chart for details).

## 2021-10-07 NOTE — Patient Instructions (Addendum)
It was nice to see you again today.  So I looked back at our visits and when I saw you in May 2022, you were taking 2 pills 3 times daily of the Sinemet and I suggested you take 1-1/2 pills 4 times a day at the time.  Then, in September 2022, I suggested you take 2 pills in the morning and evening and 1-1/2 pills during the day twice for total of 7 pills.  At any rate, today, as discussed, we will change your Sinemet to 1-1/2 pills 5 times a day, at 9 AM, 12, 3 PM, 6 PM, and 9 PM daily for total of 7-1/2 pills daily.  Please continue to keep a log for your blood pressure morning and evening as instructed by your cardiologist since you have recently started midodrine (ProAmatine).

## 2021-10-28 ENCOUNTER — Ambulatory Visit: Payer: Medicare HMO | Admitting: Pharmacist Clinician (PhC)/ Clinical Pharmacy Specialist

## 2021-10-28 ENCOUNTER — Other Ambulatory Visit: Payer: Self-pay

## 2021-10-28 DIAGNOSIS — Z79899 Other long term (current) drug therapy: Secondary | ICD-10-CM | POA: Insufficient documentation

## 2021-10-28 DIAGNOSIS — I10 Essential (primary) hypertension: Secondary | ICD-10-CM | POA: Diagnosis not present

## 2021-10-28 NOTE — Progress Notes (Signed)
10/30/2021 Ronald [name] 1951/08/02 [id number]   HPI:  Ronald [name] is a 71 y.o. male patient of Dr Duke Salvia, with a PMH below who presents today for hypertension clinic evaluation.  He was seen by Dr. Duke Salvia in October, at which time his BP was noted to be 110/70.  He was taking lisinopril 10 mg and noted to have difficulty with balance and lightheadedness with positional changes.  Lisinopril was decreased to 5 mg and at his most recent visit we added midodrine 2.5 mg in the mornings to help with low pressures.    Today he returns for follow up.  Has home readings with him today.  Interestingly, they morning readings tend to be divided into two groups.  Earlier morning systolic pressure ranges from 828-003, however when checking a few hours later, readings are up to 160-180.  Midodrine is sending up his BP significantly.  He states still some lightheadedness at times.  Evening pressures vary greatly with no pattern to readings from earlier in the day.    Past Medical History: CAD 2013 MI S/P PCI with 3 stents to RCA, OM   DM2 1/23 A1c 6.1 (peak of 6.9 2017)  hyperlipidemia 1/23 LDL 69 on rosuvastatin  Parkinson's disease Recently changed Sinimet dosing schedule, hopefully will decrease OH     Blood Pressure Goal:  130/80  Current Medications:  lisinopril 5 mg qd, midodrine 2.5 mg qd  Family Hx: mother had diabetes and sarcoidosis, father w/stroke  Social Hx: former smoker, occasional alcohol  Exercise: former Teacher, English as a foreign language, not able to do much due to back problems and balance issues  Intolerances: nkda  Labs: 1823:  Na 138, K 4.2, Glu 89, BUN 16, SCr 0.98, GFR 82   Wt Readings from Last 3 Encounters:  10/28/21 177 lb (80.3 kg)  10/07/21 180 lb (81.6 kg)  10/04/21 176 lb (79.8 kg)   BP Readings from Last 3 Encounters:  10/28/21 (!) 154/76  10/07/21 (!) 173/78  10/04/21 (!) 152/88   Pulse Readings from Last 3 Encounters:  10/28/21 60  10/07/21 (!) 56  10/04/21 64     Current Outpatient Medications  Medication Sig Dispense Refill   aspirin 81 MG chewable tablet Chew by mouth.     carbidopa-levodopa (SINEMET IR) 25-100 MG tablet Take 1.5 tablets by mouth 5 (five) times daily. Take 1-1/2 pills at 9 AM, 12, 3 PM, 6 PM and 9 PM daily. 630 tablet 3   lisinopril (ZESTRIL) 5 MG tablet Take 1 tablet (5 mg total) by mouth daily. 90 tablet 3   metoprolol succinate (TOPROL-XL) 25 MG 24 hr tablet Take by mouth.     midodrine (PROAMATINE) 2.5 MG tablet Take 1 tablet by mouth daily in the morning to help with low BP 30 tablet 1   naproxen sodium (ALEVE) 220 MG tablet Take 1 tablet by mouth as needed.     oxybutynin (DITROPAN) 5 MG tablet TAKE ONE TABLET BY MOUTH TWICE DAILY NEEDS  APPT     pioglitazone (ACTOS) 15 MG tablet daily.     rosuvastatin (CRESTOR) 20 MG tablet Take 1 tablet by mouth once daily 90 tablet 3   sertraline (ZOLOFT) 50 MG tablet Take 50 mg by mouth at bedtime.     methocarbamol (ROBAXIN) 500 MG tablet Take 1 tablet by mouth as needed.     tadalafil (CIALIS) 5 MG tablet Take 5 mg by mouth daily. (Patient not taking: Reported on 10/28/2021)     No current facility-administered medications for  this visit.    No Known Allergies  Past Medical History:  Diagnosis Date   CAD in native artery 06/04/2020   RCA infarct 2013.  S/p RCA and OM PCI   Diabetes mellitus type 2 in nonobese ([facility]) 06/04/2020   Diabetes mellitus without complication ([facility])    Essential hypertension 06/04/2020   Hypertension    Parkinson's disease ([facility])    Parkinson's disease ([facility]) 06/04/2020   Pure hypercholesterolemia 06/04/2020    Blood pressure (!) 154/76, pulse 60, resp. rate 15, height 5\' 7"  (1.702 m), weight 177 lb (80.3 kg), SpO2 100 %.  Essential hypertension Patient with essential hypertension, also with some degree of neurogenic orthostatic hypotension.  Based on his home readings, he should discontinue the daily midodrine and only take if having symptomatic  hypotension or morning systolic pressure is < 100.   He will continue to monitor home blood pressure, though does not need to be on a daily basis.  Will see him back in 3 months for follow up.     PharmD CPP [facility] [address] [place], [place] [phone]

## 2021-10-28 NOTE — Progress Notes (Deleted)
z

## 2021-10-28 NOTE — Patient Instructions (Signed)
Return for a a follow up appointment Tuesday May 9 at 1:30 pm  Check your blood pressure at home 3-4 days each week (or whenever having dizziness) and keep record of the readings.  Take your BP meds as follows:  Continue lisinopril 5 mg each evening  Take midodrine 2.5 mg only if your BP is < 123XX123 systolic (top number).    Bring all of your meds, your BP cuff and your record of home blood pressures to your next appointment.  Exercise as youre able, try to walk approximately 30 minutes per day.  Keep salt intake to a minimum, especially watch canned and prepared boxed foods.  Eat more fresh fruits and vegetables and fewer canned items.  Avoid eating in fast food restaurants.    HOW TO TAKE YOUR BLOOD PRESSURE: Rest 5 minutes before taking your blood pressure.  Dont smoke or drink caffeinated beverages for at least 30 minutes before. Take your blood pressure before (not after) you eat. Sit comfortably with your back supported and both feet on the floor (dont cross your legs). Elevate your arm to heart level on a table or a desk. Use the proper sized cuff. It should fit smoothly and snugly around your bare upper arm. There should be enough room to slip a fingertip under the cuff. The bottom edge of the cuff should be 1 inch above the crease of the elbow. Ideally, take 3 measurements at one sitting and record the average.

## 2021-10-30 ENCOUNTER — Encounter: Payer: Self-pay | Admitting: Pharmacist Clinician (PhC)/ Clinical Pharmacy Specialist

## 2021-10-30 NOTE — Assessment & Plan Note (Signed)
Patient with essential hypertension, also with some degree of neurogenic orthostatic hypotension.  Based on his home readings, he should discontinue the daily midodrine and only take if having symptomatic hypotension or morning systolic pressure is < 100.   He will continue to monitor home blood pressure, though does not need to be on a daily basis.  Will see him back in 3 months for follow up.

## 2022-01-17 ENCOUNTER — Encounter: Payer: Self-pay | Admitting: Neurology

## 2022-01-25 ENCOUNTER — Ambulatory Visit: Payer: Medicare HMO

## 2022-01-25 NOTE — Progress Notes (Deleted)
01/25/2022 Ronald [name] 1951/04/22 [id number]   HPI:  Ronald [name] is a 71 y.o. male patient of Dr Duke Salvia, with a PMH below who presents today for hypertension clinic evaluation.  He was seen by Dr. Duke Salvia in October, at which time his BP was noted to be 110/70.  He was taking lisinopril 10 mg and noted to have difficulty with balance and lightheadedness with positional changes.  Lisinopril was decreased to 5 mg and at his most recent visit we added midodrine 2.5 mg in the mornings to help with low pressures.    Today he returns for follow up.  Has home readings with him today.  Interestingly, they morning readings tend to be divided into two groups.  Earlier morning systolic pressure ranges from 836-629, however when checking a few hours later, readings are up to 160-180.  Midodrine is sending up his BP significantly.  He states still some lightheadedness at times.  Evening pressures vary greatly with no pattern to readings from earlier in the day.    At his last visit stopped mid-day midodrine dose.    Past Medical History: CAD 2013 MI S/P PCI with 3 stents to RCA, OM   DM2 1/23 A1c 6.1 (peak of 6.9 2017)  hyperlipidemia 1/23 LDL 69 on rosuvastatin  Parkinson's disease Recently changed Sinimet dosing schedule, hopefully will decrease OH     Blood Pressure Goal:  130/80  Current Medications:  lisinopril 5 mg qd, midodrine 2.5 mg qd  Family Hx: mother had diabetes and sarcoidosis, father w/stroke  Social Hx: former smoker, occasional alcohol  Exercise: former Teacher, English as a foreign language, not able to do much due to back problems and balance issues  Intolerances: nkda  Labs: 1823:  Na 138, K 4.2, Glu 89, BUN 16, SCr 0.98, GFR 82   Wt Readings from Last 3 Encounters:  10/28/21 177 lb (80.3 kg)  10/07/21 180 lb (81.6 kg)  10/04/21 176 lb (79.8 kg)   BP Readings from Last 3 Encounters:  10/28/21 (!) 154/76  10/07/21 (!) 173/78  10/04/21 (!) 152/88   Pulse Readings from Last 3  Encounters:  10/28/21 60  10/07/21 (!) 56  10/04/21 64    Current Outpatient Medications  Medication Sig Dispense Refill   aspirin 81 MG chewable tablet Chew by mouth.     carbidopa-levodopa (SINEMET IR) 25-100 MG tablet Take 1.5 tablets by mouth 5 (five) times daily. Take 1-1/2 pills at 9 AM, 12, 3 PM, 6 PM and 9 PM daily. 630 tablet 3   lisinopril (ZESTRIL) 5 MG tablet Take 1 tablet (5 mg total) by mouth daily. 90 tablet 3   methocarbamol (ROBAXIN) 500 MG tablet Take 1 tablet by mouth as needed.     metoprolol succinate (TOPROL-XL) 25 MG 24 hr tablet Take by mouth.     midodrine (PROAMATINE) 2.5 MG tablet Take 1 tablet by mouth daily in the morning to help with low BP 30 tablet 1   naproxen sodium (ALEVE) 220 MG tablet Take 1 tablet by mouth as needed.     oxybutynin (DITROPAN) 5 MG tablet TAKE ONE TABLET BY MOUTH TWICE DAILY NEEDS  APPT     pioglitazone (ACTOS) 15 MG tablet daily.     rosuvastatin (CRESTOR) 20 MG tablet Take 1 tablet by mouth once daily 90 tablet 3   sertraline (ZOLOFT) 50 MG tablet Take 50 mg by mouth at bedtime.     tadalafil (CIALIS) 5 MG tablet Take 5 mg by mouth daily. (Patient not taking: Reported  on 10/28/2021)     No current facility-administered medications for this visit.    No Known Allergies  Past Medical History:  Diagnosis Date   CAD in native artery 06/04/2020   RCA infarct 2013.  S/p RCA and OM PCI   Diabetes mellitus type 2 in nonobese (HCC) 06/04/2020   Diabetes mellitus without complication (HCC)    Essential hypertension 06/04/2020   Hypertension    Parkinson's disease (HCC)    Parkinson's disease (HCC) 06/04/2020   Pure hypercholesterolemia 06/04/2020    There were no vitals taken for this visit.  No problem-specific Assessment & Plan notes found for this encounter.    Phillips Hay PharmD CPP [facility] [address] [place], [place] [address] [phone]

## 2022-01-26 ENCOUNTER — Ambulatory Visit: Payer: Medicare HMO | Admitting: Pharmacist Clinician (PhC)/ Clinical Pharmacy Specialist

## 2022-01-26 DIAGNOSIS — I10 Essential (primary) hypertension: Secondary | ICD-10-CM | POA: Diagnosis not present

## 2022-01-26 MED ORDER — LISINOPRIL 5 MG PO TABS: 5.0000 mg | ORAL_TABLET | Freq: Two times a day (BID) | ORAL | 3 refills | Status: DC

## 2022-01-26 NOTE — Patient Instructions (Addendum)
Return for a a follow up appointment with Dr. Duke Salvia in October ? ?Check your blood pressure at home daily and keep record of the readings. ? ?Take your BP meds as follows: ? Increase lisinopril to 5 mg in the mornings and at 3 pm (with carbidopa/levodopa).  Will have you skip noon dose if BP is < 110 systolic (top number) ? ?Bring all of your meds, your BP cuff and your record of home blood pressures to your next appointment.  Exercise as you?re able, try to walk approximately 30 minutes per day.  Keep salt intake to a minimum, especially watch canned and prepared boxed foods.  Eat more fresh fruits and vegetables and fewer canned items.  Avoid eating in fast food restaurants.  ? ? HOW TO TAKE YOUR BLOOD PRESSURE: ?Rest 5 minutes before taking your blood pressure. ? Don?t smoke or drink caffeinated beverages for at least 30 minutes before. ?Take your blood pressure before (not after) you eat. ?Sit comfortably with your back supported and both feet on the floor (don?t cross your legs). ?Elevate your arm to heart level on a table or a desk. ?Use the proper sized cuff. It should fit smoothly and snugly around your bare upper arm. There should be enough room to slip a fingertip under the cuff. The bottom edge of the cuff should be 1 inch above the crease of the elbow. ?Ideally, take 3 measurements at one sitting and record the average. ? ? ?

## 2022-01-26 NOTE — Assessment & Plan Note (Signed)
Patient with essential hypertension with some measure of neurogenic orthostatic hypotension.  He notes the hypotension episodes are less frequent, although he still has spells of dizziness and has had 2-3 falls in the past month.  Do not want to be too aggressive with BP lowering because of his fall risk and hypotensive episodes.  He is worried about some of the higher readings.  We will increase the lisinopril to 5 mg twice daily, at 9 am and 3 pm (with doses of carbidopa/levodopa).  He should not take the second dose if systolic pressure is < 110.  He still has midodrine at home, and will continue to use for symptomatic hypotensive episodes.   ?

## 2022-01-26 NOTE — Progress Notes (Signed)
? ? ? ?01/26/2022 ?Ronald [name] ?[birth year] ?[id number] ? ? ?HPI:  Ronald [name] is a 71 y.o. male patient of Dr Duke Salvia, with a PMH below who presents today for hypertension clinic evaluation.  He was seen by Dr. Duke Salvia in October, at which time his BP was noted to be 110/70.  He was taking lisinopril 10 mg and noted to have difficulty with balance and lightheadedness with positional changes.  Lisinopril was decreased to 5 mg and at his most recent visit we added midodrine 2.5 mg in the mornings to help with low pressures.  He has used the midodrine sporadically, when systolic < 100.   ? ?Today he returns for follow up.  Notes that he has not used the midodrine at all in the past month.  Had 2 readings < 100 systolic, one was in the evening shortly after his last visit and the other was this morning.  He does endorse 2-3 falls in the past few weeks.  Notes that he is able to fall down to knees/hips and has not had any hits to his head.   ? ?Past Medical History: ?CAD 2013 MI S/P PCI with 3 stents to RCA, OM   ?DM2 1/23 A1c 6.1 (peak of 6.9 2017)  ?hyperlipidemia 1/23 LDL 69 on rosuvastatin  ?Parkinson's disease Recently changed Sinimet dosing schedule, hopefully will decrease OH  ?  ? ?Blood Pressure Goal:  140/90 ? ?Current Medications:  lisinopril 5 mg qd, midodrine 2.5 mg prn systolic < 100 ? ?Family Hx: mother had diabetes and sarcoidosis, father w/stroke ? ?Social Hx: former smoker, occasional alcohol ? ?Diet:  eats breakfast - grits, 2 eggs; occasional coffee occasional vegetables, - frozen; some chicken and beef, occasional fish  ? 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 ?Intolerances: nkda ? ?Home BP readings: 15 readings, mix of morning and evening, average 137/82 (range 94-178/71-97) ? ?Labs: 1823:  Na 138, K 4.2, Glu 89, BUN 16, SCr 0.98, GFR 82 ? ? ?Wt Readings from Last 3 Encounters:  ?01/26/22 172 lb 12.8 oz (78.4 kg)  ?10/28/21 177 lb (80.3 kg)   ?10/07/21 180 lb (81.6 kg)  ? ?BP Readings from Last 3 Encounters:  ?01/26/22 (!) 154/80  ?10/28/21 (!) 154/76  ?10/07/21 (!) 173/78  ? ?Pulse Readings from Last 3 Encounters:  ?01/26/22 (!) 55  ?10/28/21 60  ?10/07/21 (!) 56  ? ? ?Current Outpatient Medications  ?Medication Sig Dispense Refill  ? aspirin 81 MG chewable tablet Chew by mouth.    ? carbidopa-levodopa (SINEMET IR) 25-100 MG tablet Take 1.5 tablets by mouth 5 (five) times daily. Take 1-1/2 pills at 9 AM, 12, 3 PM, 6 PM and 9 PM daily. 630 tablet 3  ? ciprofloxacin (CIPRO) 500 MG tablet Take 500 mg by mouth 2 (two) times daily.    ? lisinopril (ZESTRIL) 5 MG tablet Take 1 tablet (5 mg total) by mouth 2 (two) times daily. 18 tablet 3  ? methocarbamol (ROBAXIN) 500 MG tablet Take 1 tablet by mouth as needed.    ? metoprolol succinate (TOPROL-XL) 25 MG 24 hr tablet Take by mouth.    ? midodrine (PROAMATINE) 2.5 MG tablet Take 1 tablet by mouth daily in the morning to help with low BP 30 tablet 1  ? naproxen sodium (ALEVE) 220 MG tablet Take 1 tablet by mouth as needed.    ? oxybutynin (DITROPAN) 5 MG tablet TAKE ONE TABLET BY MOUTH TWICE DAILY NEEDS  APPT    ? pioglitazone (ACTOS) 15 MG tablet daily.    ? rosuvastatin (CRESTOR) 20 MG tablet Take 1 tablet by mouth once daily 90 tablet 3  ? sertraline (ZOLOFT) 50 MG tablet Take 50 mg by mouth at bedtime.    ? ?No current facility-administered medications for this visit.  ? ? ?No Known Allergies ? ?Past Medical History:  ?Diagnosis Date  ? CAD in native artery 06/04/2020  ? RCA infarct 2013.  S/p RCA and OM PCI  ? Diabetes mellitus type 2 in nonobese (HCC) 06/04/2020  ? Diabetes mellitus without complication (HCC)   ? Essential hypertension 06/04/2020  ? Hypertension   ? Parkinson's disease (HCC)   ? Parkinson's disease (HCC) 06/04/2020  ? Pure hypercholesterolemia 06/04/2020  ? ? ?Blood pressure (!) 154/80, pulse (!) 55, resp. rate 15, height 5\' 7"  (1.702 m), weight 172 lb 12.8 oz (78.4 kg), SpO2 99  %. ? ?Essential hypertension ?Patient with essential hypertension with some measure of neurogenic orthostatic hypotension.  He notes the hypotension episodes are less frequent, although he still has spells of dizziness and has had 2-3 falls in the past month.  Do not want to be too aggressive with BP lowering because of his fall risk and hypotensive episodes.  He is worried about some of the higher readings.  We will increase the lisinopril to 5 mg twice daily, at 9 am and 3 pm (with doses of carbidopa/levodopa).  He should not take the second dose if systolic pressure is < 110.  He still has midodrine at home, and will continue to use for symptomatic hypotensive episodes.   ? ? ? ? PharmD CPP [facility] ?[facility] ?[address] ?[place], [place] ?[phone] ? ?

## 2022-02-10 ENCOUNTER — Encounter: Payer: Self-pay | Admitting: Neurology

## 2022-02-10 ENCOUNTER — Ambulatory Visit: Payer: Medicare HMO | Admitting: Neurology

## 2022-02-10 VITALS — BP 149/80 | HR 64 | Ht 67.0 in | Wt 174.0 lb

## 2022-02-10 DIAGNOSIS — G20 Parkinson's disease: Secondary | ICD-10-CM | POA: Diagnosis not present

## 2022-02-10 DIAGNOSIS — M545 Low back pain, unspecified: Secondary | ICD-10-CM | POA: Diagnosis not present

## 2022-02-10 DIAGNOSIS — M25562 Pain in left knee: Secondary | ICD-10-CM

## 2022-02-10 DIAGNOSIS — G8929 Other chronic pain: Secondary | ICD-10-CM

## 2022-02-10 NOTE — Patient Instructions (Signed)
It was nice to see you again today.  Please continue with your medication regimen for your Parkinson's disease, 1-1/2 pills 5 times a day.  Please talk to your pharmacist about getting a good pill cutter.  Please take your time when you stand up and start walking. Try to stay well-hydrated and be proactive about constipation, use your cane at all times.

## 2022-02-10 NOTE — Progress Notes (Signed)
Subjective:    [name] ID: Ronald [name] is a 71 y.o. male.  HPI    Interim history:   Ronald [name] is a 71 year old left-handed gentleman with an underlying medical history of diabetes, Bell's palsy some 10+ years ago, overweight state, CAD (s/p MI in 2013, s/p 3 stents), and hypertension who presents for follow-up consultation of his right-sided predominant Parkinson's disease, complicated by back pain, knee pain, bladder incontinence, history of fall and constipation.  Ronald [name] is unaccompanied today.  I last saw Ronald [name] on 10/07/2021, at which time Ronald [name] reported that increasing Sinemet to 4 times a day caused his joint pain to be worse.  Ronald [name] was on 2 pills 3 times daily.  I suggested Ronald [name] change his Sinemet regimen to 1-1/2 pills 5 times a day starting at 9 AM, 3 hourly. Ronald [name] had recently started [facility] per Cardiology.  Today, 02/10/2022: Ronald [name] reports feeling fairly stable from Ronald Parkinson's standpoint, takes Sinemet 1-1/2 pills 5 times a day starting at around 9 AM, 3 hourly.  It is difficult for Ronald [name] to cut Ronald pills in half Ronald [name] admits.  Ronald [name] misplaced his pill cutter.  Ronald [name] no longer takes Ronald ProAmatine.  Ronald [name] takes MiraLAX as needed for his constipation, not daily. Of note, Ronald [name] recently had cystoscopy with Botox under urology on 01/21/2022.  Still has nocturia about 4-5 times per average night, no telltale results from Ronald Botox injection.  Has a follow-up pending.  Still has significant low back pain and left knee pain, left shoulder pain.  Has not seen orthopedics in over 6 months.  No recent falls.  [facility], is enrolled in spinning classes and also does chair cardio through a class offered by his church.  Kids are doing well.   Ronald [name]'s allergies, current medications, family history, past medical history, past social history, past surgical history and problem list were reviewed and updated as appropriate.    Previously (copied from previous notes for reference):       I saw Ronald [name] on 06/03/2021, at which time Ronald [name] reported significant left knee pain.  Ronald [name] was utilizing a prescription knee brace.  Ronald [name] was using a cane.  Ronald [name] had a recent fall, landed on his back but did not hit his head and sustained no serious injuries thankfully.  Constipation was under decent control.  Ronald [name] was advised to increase his Sinemet from 1-1/2 pills 4 times daily to 2 pills in Ronald morning and 2 pills in Ronald evening, maintain 1-1/2 pills for Ronald midday doses.    I saw Ronald [name] on 01/27/2021, at which time Ronald [name] reported doing fairly well, Ronald [name] had ongoing issues with knee pain and low back pain.  Ronald [name] reported having fallen.  Ronald [name] had not used a walker and Ronald [name] was not keen on starting to use a walker.  We talked about potentially utilizing Nourianz.     I saw Ronald [name] on 07/30/2020, at which time Ronald [name] reported feeling fairly stable.  Ronald [name] was taking Sinemet 2 pills 3 times daily.  Ronald [name] was trying to stay active and go to Ronald gym about twice a week.  Ronald [name] reported occasional coughing when drinking water but did not want to seek formal evaluation with speech therapy or swallow study.  Ronald [name] had noticed some swelling around his ankles.  Ronald [name] was switched by his cardiologist from Zocor to Crestor. Ronald [name] was advised to continue with generic Sinemet 2 pills 3 times daily.   I saw Ronald [name] on 01/28/2020,  at which time Ronald [name] reported ongoing issues with intermittent constipation.  Ronald [name] had fallen a couple of times but thankfully without any major injuries.  We talked about Ronald importance of fall prevention and constipation control.  Ronald [name] was not able to tolerate Sinemet at 2 pills 4 times daily and had scaled back to 2 pills 3 times daily.  Ronald [name] was advised to continue with this. Ronald [name] was advised to use his cane consistently.   I saw Ronald [name] on 09/30/2019, at which time Ronald [name] reported that his balance was worse and Ronald [name] had 2 falls because his left knee gave out.  Ronald [name] had arthritis issues with Ronald knees and is seen orthopedics.  Ronald [name] had seen neurosurgery in [place] in  September 2020.  Ronald [name] felt that overall his slowness was worse.  Ronald [name] was advised to increase his Sinemet to 2 pills 4 times daily from tid.      I saw Ronald [name] on 05/30/2019, at which time Ronald [name] reported ongoing problems with his low back pain.  Ronald [name] had tried medication including narcotic pain medication, muscle relaxer and gabapentin.  Ronald [name] was supposed to have a neurosurgical evaluation soon.  Ronald [name] had problems with constipation.  Ronald [name] was still taking Sinemet 2 pills 3 times daily with reasonably good results.  Ronald [name] also reported overall feeling more stressed.  Ronald [name] was advised to be more proactive about constipation issues and try to be more active physically with Ronald help of a recumbent stationary bike if possible.   I saw Ronald [name] on 11/27/2018, at which time Ronald [name] was complaining of low back pain.  Ronald [name] had lumbar spine x-rays in November 2019 which showed some degenerative changes.  Ronald [name] had received a lumbar spine injection under Dr. Durward Fortes in December which Ronald [name] felt was not that helpful.  Ronald [name] felt that Ronald back pain was enough to limit his exercise.  Ronald [name] had not pursued his hobby of making pens very much either.  Ronald [name] was advised to continue with Sinemet 2 pills 3 times daily.  Ronald [name] was advised to try gabapentin low dose with gradual increase to up to 100 mg 3 times daily.    I saw Ronald [name] on 05/29/2018, at which time Ronald [name] reported having done well after interim back surgery on 03/06/2018. Ronald [name] also had radiofrequency surgery prior to that. Ronald [name] reported occasional dizziness and nausea, some loss of appetite. I suggested we change his Sinemet from 2 pills 3 times a day to 1-1/2 pills 4 times a day. Ronald [name] reported falls. Ronald [name] was physically quite active, was going to [facility]. His falls primarily occurred when Ronald [name] was turning.    11/02/2017, at which time Ronald [name] reported more back pain. Ronald [name] had undergone an injection in December 2018 but had found no relief. Ronald [name]. Ronald [name] was active physically but had to take a break because of back pain. Ronald [name] was  taking Mobic for back pain. Ronald [name] had a slow reduction and his hemoglobin and red cell counts and I counseled Ronald [name] on Ronald daily use of meloxicam.     I saw Ronald [name] on 05/02/2017, at which time Ronald [name] was complaining of lower back pain. Ronald [name] had seen orthopedics for this. Ronald [name] also had right knee pain, status post injection. Ronald [name] had not fallen thankfully. Ronald [name] was taking Sinemet 2 pills 3 times a day.   I saw Ronald [name] on 10/31/2016, at which time Ronald [name] reported doing okay, no recent new symptoms, in particular no significant memory loss or mood disorder constipation. Ronald [name] did have an episode of  chest pain recently. Ronald [name] saw his cardiologist. Ronald [name] felt to be a little dehydrated and blood pressure was on Ronald lower end of normal. Ronald [name] was noted to have some orthostatic hypotension. Ronald [name] was started on northera 100 mg tid, but had not filled it yet. Ronald [name] was trying to stay active and going to Ronald [facility] 3 times a week. Ronald [name] was also interested in pursuing boxing classes for PD. Ronald [name] had lost a little bit of weight and reported some loss of appetite. I suggested Ronald [name] continue with Sinemet 2 pills 3 times a day. Advised to change positions slowly and be mindful of his blood pressure dropping potentially with sudden changes of position and Ronald [name] was reminded to stay well-hydrated.   I first met Ronald [name] on 04/27/2016 at Ronald request of his primary care physician, at which time Ronald [name] reported a prior diagnosis of Parkinson's disease in 2014 and symptoms dating back to early 2014. Ronald [name] was exercising regularly, Ronald [name] was doing well on symptomatic treatment with Sinemet and I suggested Ronald [name] continue with Ronald medication, at 2 pills 3 times a day. Ronald [name] was reminded to stay active physically and mentally and stable hydrated and well-nourished.    04/27/2016: Ronald [name] was diagnosed with Parkinson's disease in 2014. Symptoms date back to beginning of 2014, and started with speech changes, was noted to dragging his feet.  Ronald [name] has been on Sinemet for Ronald past 3 years, with gradual increase,  currently 2 pills in Ronald morning, 2 in Ronald afternoon and 1 at night, was asked to reduce it, Ronald [name] is not sure why, seems to tolerate it well and believes it has helped. Ronald [name] is wondering however if there is any new medication available. Ronald [name] has not been on a dopamine agonist before from what I understand, no Azilect, no other MAO B inhibitor either.  Ronald [name] exercises regularly and is enrolled in Ronald PD spinning class at 2 different [employer]. Lives with his wife of 45 years in [place], and they have 3 grown daughters, 2 GC. Ronald [name] is a nonsmoker, drinks alcohol infrequently, is a retired Geophysicist/field seismologist.  I reviewed your office note from 02/08/2016. Ronald [name] used to see Dr. Marijean Bravo, neurologist out of [facility] but Dr. Marijean Bravo retired. Ronald [name] then saw another neurologist in Ronald same office but she left. Prior office records from his previous neurologists are not available for my review today. We will request office records from Dr. Barbera Setters office, [name] signed a ROI form today and is agreeable.   Ronald [name] denies any major memory or mood issues or sleep issues, does snore some, but no apneas reported, no hx of RBD. No FHx of PD.  Ronald [name] varies his C/L dose, 1st dose 6-9 AM, second dose around 12 or 1 PM and last dose around 8 PM.    Ronald [name] also plays golf, once or twice a week. Ronald [name] tries to stay well-hydrated, may not always drink enough water however.  His Past Medical History Is Significant For: Past Medical History:  Diagnosis Date   CAD in native artery 06/04/2020   RCA infarct 2013.  S/p RCA and OM PCI   Diabetes mellitus type 2 in nonobese ([place]) 06/04/2020   Diabetes mellitus without complication ([place])    Essential hypertension 06/04/2020   Hypertension    Parkinson's disease ([place])    Parkinson's disease ([place]) 06/04/2020   Pure hypercholesterolemia 06/04/2020    His Past Surgical History Is Significant For: Past Surgical History:  Procedure Laterality Date   APPENDECTOMY     back epidural  08/2017   BACK SURGERY     CORONARY  ANGIOPLASTY WITH STENT PLACEMENT  08/19/2012   KNEE SURGERY      His Family History Is Significant For: Family History  Problem Relation Age of Onset   Diabetes Mother    Sarcoidosis Mother    Stroke Father    Parkinson's disease Neg Hx     His Social History Is Significant For: Social History   Socioeconomic History   Marital status: Married    Spouse name: Memory Dance    Number of children: 3   Years of education: Not on file   Highest education level: Not on file  Occupational History   Occupation: Retired   Tobacco Use   Smoking status: Former   Smokeless tobacco: Never  Scientific laboratory technician Use: Never used  Substance and Sexual Activity   Alcohol use: Yes    Comment: occ   Drug use: No   Sexual activity: Not on file  Other Topics Concern   Not on file  Social History Narrative   Drinks about 1 cup of coffee a day    Social Determinants of Radio broadcast assistant Strain: Not on file  Food Insecurity: Not on file  Transportation Needs: Not on file  Physical Activity: Not on file  Stress: Not on file  Social Connections: Not on file    His Allergies Are:  No Known Allergies:   His Current Medications Are:  Outpatient Encounter Medications as of 02/10/2022  Medication Sig   aspirin 81 MG chewable tablet Chew by mouth.   carbidopa-levodopa (SINEMET IR) 25-100 MG tablet Take 1.5 tablets by mouth 5 (five) times daily. Take 1-1/2 pills at 9 AM, 12, 3 PM, 6 PM and 9 PM daily.   ciprofloxacin (CIPRO) 500 MG tablet Take 500 mg by mouth 2 (two) times daily.   lisinopril (ZESTRIL) 5 MG tablet Take 1 tablet (5 mg total) by mouth 2 (two) times daily.   methocarbamol (ROBAXIN) 500 MG tablet Take 1 tablet by mouth as needed.   metoprolol succinate (TOPROL-XL) 25 MG 24 hr tablet Take by mouth.   midodrine (PROAMATINE) 2.5 MG tablet Take 1 tablet by mouth daily in Ronald morning to help with low BP   naproxen sodium (ALEVE) 220 MG tablet Take 1 tablet by mouth as needed.    oxybutynin (DITROPAN) 5 MG tablet TAKE ONE TABLET BY MOUTH TWICE DAILY NEEDS  APPT   pioglitazone (ACTOS) 15 MG tablet daily.   rosuvastatin (CRESTOR) 20 MG tablet Take 1 tablet by mouth once daily   sertraline (ZOLOFT) 50 MG tablet Take 50 mg by mouth at bedtime.   No facility-administered encounter medications on file as of 02/10/2022.  :  Review of Systems:  Out of a complete 14 point review of systems, all are reviewed and negative with Ronald exception of these symptoms as listed below:  Review of Systems  Neurological:        Pt is parkinson follow up  pt states Ronald [name] has no questions or concerns for today's visit . Pt states not much change in 4 months    Objective:  Neurological Exam  Physical Exam Physical Examination:   Vitals:   02/10/22 1315  BP: (!) 149/80  Pulse: 64    General Examination: Ronald [name] is a very pleasant 71 y.o. male in no acute distress. Ronald [name] appears well-developed and well-nourished and well groomed.   HEENT: Normocephalic, atraumatic, pupils are equal, round and reactive to light,  extraocular tracking is mildly impaired, face is asymmetric with fairly stable appearing left facial weakness and occasional blepharospasm and L hemifacial spasm noted.  Ronald [name] has moderate facial masking overall, moderate to severe hypophonia and mild intermittent dysarthria.  Neck is moderately rigid.  Airway examination reveals stable findings, mild mouth dryness, adequate dental hygiene, tongue protrudes centrally and palate elevates symmetrically.  No obvious sialorrhea.  No carotid bruits.   Chest: Clear to auscultation without wheezing, rhonchi or crackles noted.   Heart: S1+S2+0, regular and normal without murmurs, rubs or gallops noted.    Abdomen: Soft, non-tender and non-distended.   Extremities: There is no pitting edema in Ronald lower extremities today.    Skin: Warm and dry without trophic changes noted.   Musculoskeletal: exam reveals L knee pain and mild left knee  swelling.  Decreased range of motion left shoulder.     Neurologically:  Mental status: Ronald [name] is awake, alert and oriented in all 4 spheres. His immediate and remote memory, attention, language skills and fund of knowledge are appropriate. There is no evidence of aphasia, agnosia, apraxia or anomia. Thought process is linear. Mood is normal and affect is normal.  Cranial nerves II - XII are as described above under HEENT exam. Motor exam: Normal bulk, strength for age, Ronald [name] has a mild intermittent right upper extremity resting tremor.  No dyskinesia noted today. Ronald [name] has moderate difficulty with fine motor skills on Ronald right including finger taps, hand movements and rapid alternating patting, foot taps and foot agility as well. On Ronald left, Ronald [name] has overall milder findings. Sensory exam intact to light touch throughout. Overall moderate bradykinesia. Gait, station, balance: Ronald [name] stands with mild difficulty, and pushes himself up, slightly wider stance, Ronald [name] has a moderately stooped posture, mild right upper body tilt.  Ronald [name] has a single-point cane on Ronald left side, walks with a limp, more significant limping today.   Assessment and Plan:    In summary, Ronald [name] is a very pleasant 71 year old left-handed gentleman with an underlying medical history of coronary artery disease, status post stent placement, history of left-sided Bell's palsy, overweight state, degenerative back d/s, with s/p ESI, arthritis with s/p knee injections, hypertension, type 2 diabetes, and status post lumbar spine surgery in June 2019, who presents for follow-up consultation of his right-sided predominant Parkinson's disease, complicated by low back pain, knee pain, left shoulder pain, constipation, bladder hyperactivity, status post Botox injection, and history of falls. Ronald [name] has seen Dr. Durward Fortes for his arthritis. Ronald [name] has been using a cane more consistently and a knee brace as needed. We again talked about his fall risk and Ronald  importance of fall prevention.  Ronald [name] was not amenable to trying a walker.  Ronald [name] has had physical therapy.  Ronald [name] has been on Sinemet, currently 1-1/2 pills 5 times a day, we changed this in January 2023. Of note when Ronald [name] increased Sinemet to 2 pills 4 times a day Ronald [name] could not tolerate it.  Ronald [name] was reluctant to add a new medication such as Nourianz, we discussed this before.  Ronald [name] does not take Ronald ProAmatine currently.  Ronald [name] has an appointment with his neurologist.  Ronald [name] is trying to stay active physically.  Ronald [name] is advised to stay proactive about constipation issues, and encouraged to maintain his current medication regimen for his PAD.  Ronald [name] is advised to follow-up in 6 months, sooner if needed.  I answered all his questions today and Ronald [name] was in agreement.   I spent 30  minutes in total face-to-face time and in reviewing records during pre-charting, more than 50% of which was spent in counseling and coordination of care, reviewing test results, reviewing medications and treatment regimen and/or in discussing or reviewing Ronald diagnosis of PD, Ronald prognosis and treatment options. Pertinent laboratory and imaging test results that were available during this visit with Ronald [name] were reviewed by me and considered in my medical decision making (see chart for details).

## 2022-04-20 ENCOUNTER — Encounter: Payer: Self-pay | Admitting: Orthopaedic Surgery

## 2022-04-20 ENCOUNTER — Ambulatory Visit: Payer: Medicare HMO | Admitting: Orthopaedic Surgery

## 2022-04-20 DIAGNOSIS — M1712 Unilateral primary osteoarthritis, left knee: Secondary | ICD-10-CM | POA: Diagnosis not present

## 2022-04-20 DIAGNOSIS — M19012 Primary osteoarthritis, left shoulder: Secondary | ICD-10-CM | POA: Diagnosis not present

## 2022-04-20 MED ORDER — METHYLPREDNISOLONE ACETATE 40 MG/ML IJ SUSP
80.0000 mg | INTRAMUSCULAR | Status: AC | PRN
  Administered 2022-04-20: 80 mg via INTRA_ARTICULAR

## 2022-04-20 MED ORDER — BUPIVACAINE HCL 0.25 % IJ SOLN
2.0000 mL | INTRAMUSCULAR | Status: AC | PRN
  Administered 2022-04-20: 2 mL via INTRA_ARTICULAR

## 2022-04-20 MED ORDER — LIDOCAINE HCL 1 % IJ SOLN
2.0000 mL | INTRAMUSCULAR | Status: AC | PRN
  Administered 2022-04-20: 2 mL

## 2022-04-20 NOTE — Progress Notes (Signed)
Office Visit Note   Patient: Ronald [name]           Date of Birth: September 30, 1950           MRN: [id number] Visit Date: 04/20/2022              Requested by: Ronald Chen, MD [address] [place],  [place] [address] PCP: Ronald Chen, MD   Assessment & Plan: Visit Diagnoses:  1. Primary osteoarthritis, left shoulder   2. Unilateral primary osteoarthritis, left knee     Plan: Ronald [name] has a history of advanced osteoarthritis left shoulder and left knee by prior x-rays.  He is done well with cortisone injections.  He had an exacerbation of his pain and wishes to have a repeat cortisone.  He does have Parkinson's and uses a cane.  He has had some frequent falls.  I injected the left shoulder glenohumeral joint and the left knee and we will plan monitor his response  Follow-Up Instructions: Return if symptoms worsen or fail to improve.   Orders:  Orders Placed This Encounter  Procedures   Large Joint Inj: L knee   Large Joint Inj: L glenohumeral   No orders of the defined types were placed in this encounter.     Procedures: Large Joint Inj: L knee on 04/20/2022 3:54 PM Indications: pain and diagnostic evaluation Details: 25 G 1.5 in needle, anteromedial approach  Arthrogram: No  Medications: 2 mL lidocaine 1 %; 80 mg methylPREDNISolone acetate 40 MG/ML; 2 mL bupivacaine 0.25 % Procedure, treatment alternatives, risks and benefits explained, specific risks discussed. Consent was given by the patient. Patient was prepped and draped in the usual sterile fashion.    Large Joint Inj: L glenohumeral on 04/20/2022 3:55 PM Details: 25 G 1.5 in needle, posterior approach  Arthrogram: No  Medications: 2 mL lidocaine 1 %; 80 mg methylPREDNISolone acetate 40 MG/ML; 2 mL bupivacaine 0.25 % Procedure, treatment alternatives, risks and benefits explained, specific risks discussed. Consent was given by the patient. Immediately prior to procedure a time out was called to verify the  correct patient, procedure, equipment, support staff and site/side marked as required. Patient was prepped and draped in the usual sterile fashion.       Clinical Data: No additional findings.   Subjective: Chief Complaint  Patient presents with   Left Knee - Follow-up   Left Shoulder - Follow-up   Patient presents today for left knee and left shoulder pain. Patient states that with his left knee he has been noticing that he is frequently falling and having left knee instability. At this time he is currently ambulating with a cane. Patient states that he last received injection on 12/29/2021 which helped with his pain. With his left shoulder he has been having increased pain with ROM and overhead reaching motions. Patient last had xray's preformed on 09/23/2020.  Review of Systems   Objective: Vital Signs: There were no vitals taken for this visit.  Physical Exam Constitutional:      Appearance: He is well-developed.  Pulmonary:     Effort: Pulmonary effort is normal.  Skin:    General: Skin is warm and dry.  Neurological:     Mental Status: He is alert and oriented to person, place, and time.  Psychiatric:        Behavior: Behavior normal.     Ortho Exam awake alert and oriented x3.  Comfortable sitting.  Does have a pill-rolling tremor from his Parkinson's.  Some halting speech  related to the Parkinson's.  Does use a cane in his right hand for ambulation and balance.  Very minimal effusion right knee with mostly medial joint pain.  Lacks just a few degrees to full extension and flexed over 100 degrees without instability.  Predominant medial joint pain diffusely.  Some patella crepitation but no pain with compression.  Left shoulder with limited range of motion.  Lacked the last 30 to 40 degrees of overhead motion and some external rotation consistent with his arthritis.  There was some mild crepitation.  No distal edema.  Good grip and good release.  Specialty Comments:   No specialty comments available.  Imaging: No results found.   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   Iron deficiency anemia 11/14/2019   Unilateral primary osteoarthritis, left knee 04/01/2019   Chronic left-sided low back pain with left-sided sciatica 04/01/2019   Lumbar radiculopathy 08/21/2017   Chronic pain syndrome 08/04/2017   Facet arthropathy 08/04/2017   Lumbar paraspinal muscle spasm 08/04/2017   Anemia of chronic disease 07/03/2017   Dizziness 11/10/2016   Hyperlipidemia, unspecified 10/27/2016   Other intervertebral disc degeneration, lumbar region 06/30/2016   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 heart with stable angina pectoris (HCC) 02/26/2016   SOB (shortness of breath) 02/26/2016   Old MI (myocardial infarction) 02/26/2016   OAB (overactive bladder) 09/05/2013   Ischemic heart disease 09/21/2012   Bell's palsy 06/14/2012   Past Medical History:  Diagnosis Date   CAD in native artery 06/04/2020   RCA infarct 2013.  S/p RCA and OM PCI   Diabetes mellitus type 2 in nonobese (HCC) 06/04/2020   Diabetes mellitus without complication (HCC)    Essential hypertension 06/04/2020   Hypertension    Parkinson's disease (HCC)    Parkinson's disease (HCC) 06/04/2020   Pure hypercholesterolemia 06/04/2020    Family History  Problem Relation Age of Onset   Diabetes Mother    Sarcoidosis Mother    Stroke Father    Parkinson's disease Neg Hx     Past Surgical History:  Procedure Laterality Date    APPENDECTOMY     back epidural   08/2017   BACK SURGERY     CORONARY ANGIOPLASTY WITH STENT PLACEMENT  08/19/2012   KNEE SURGERY     Social History   Occupational History   Occupation: Retired   Tobacco Use   Smoking status: Former   Smokeless tobacco: Never  Building services engineer Use: Never used  Substance and Sexual Activity   Alcohol use: Yes    Comment: occ   Drug use: No   Sexual activity: Not on file

## 2022-04-27 ENCOUNTER — Encounter (HOSPITAL_BASED_OUTPATIENT_CLINIC_OR_DEPARTMENT_OTHER): Payer: Self-pay | Admitting: Cardiovascular Disease

## 2022-04-27 ENCOUNTER — Ambulatory Visit (HOSPITAL_BASED_OUTPATIENT_CLINIC_OR_DEPARTMENT_OTHER): Payer: Medicare HMO | Admitting: Cardiovascular Disease

## 2022-04-27 VITALS — BP 96/58 | HR 68 | Ht 67.0 in | Wt 177.1 lb

## 2022-04-27 DIAGNOSIS — I2510 Atherosclerotic heart disease of native coronary artery without angina pectoris: Secondary | ICD-10-CM | POA: Diagnosis not present

## 2022-04-27 DIAGNOSIS — I10 Essential (primary) hypertension: Secondary | ICD-10-CM

## 2022-04-27 NOTE — Patient Instructions (Signed)
Medication Instructions:  Your physician recommends that you continue on your current medications as directed. Please refer to the Current Medication list given to you today.   *If you need a refill on your cardiac medications before your next appointment, please call your pharmacy*  Lab Work: NONE  Testing/Procedures: NONE  Follow-Up: At [facility], you and your health needs are our priority.  As part of our continuing mission to provide you with exceptional heart care, we have created designated Provider Care Teams.  These Care Teams include your primary Cardiologist (physician) and Advanced Practice Providers (APPs -  Physician Assistants and Nurse Practitioners) who all work together to provide you with the care you need, when you need it.  We recommend signing up for the patient portal called "MyChart".  Sign up information is provided on this After Visit Summary.  MyChart is used to connect with patients for Virtual Visits (Telemedicine).  Patients are able to view lab/test results, encounter notes, upcoming appointments, etc.  Non-urgent messages can be sent to your provider as well.   To learn more about what you can do with MyChart, go to [email or web address].    Your next appointment:   6 month(s)  The format for your next appointment:   In Person  Provider:   Tiffany Mabie, MD            

## 2022-04-27 NOTE — Progress Notes (Signed)
Cardiology Office Note   Date:  04/27/2022   ID:  Ronald [name], DOB 09-18-1951, MRN [id number]  PCP:  Ronald Chen, MD  Cardiologist:   Ronald Si, MD   No chief complaint on file.  History of Present Illness: Ronald [name] is a 71 y.o. male with CAD s/p PCI, diabetes, hypertension, hyperlipidemia, Parkinson's diseases and depression who is here for follow up. Ronald [name] had an MI in 2013.  He had three stents placed at that time in the RCA and OM.  He has done well and has no angina.  He was receiving care at [facility] and elected to transfer.  He had a [employer] 02/2018 that revealed mild posterolateral ischemia and LVEF 65%.  He struggles with chronic back pain that limits his ability to exercise.  He liked golf but is no longer able to do so.  He had surgery on his back but still has weakness and pain.  He does try to get some exercise by walking.  He had no exertional chest pain or shortness of breath.  He also denied claudication.  He had complained of some muscle cramping in his thighs and pain in his left knee.  He had chronic lower extremity edema without orthopnea or PND.  He tries to elevate his legs when sitting but does not use compression socks.  He quit smoking in the early 1990s after smoking less than 1 pack/week for 20 years.  He likes to travel to [place] often but had not been able to do so due to COVID-19.  He had a long career as a Sports coach.  Ronald [name] had myalgias on simvastatin and was switched to rosuvastatin.  He had an echo 06/2020 that revealed LVEF 60 to 65% with normal diastolic function and aortic valve sclerosis without stenosis.  At his last visit his blood pressure was controlled but he had orthostatic symptoms. Lisinopril was reduced. He followed up with our pharmacist, and midodrine was added for low blood pressures in the mornings. He was instructed to take it for systolic BP less than 100 but  rarely needed to use it. In the office his blood pressures were running in the 150's, so lisinopril was changed to 5 mg twice daily.   Today, he is doing well other than back pain. He has been doing some exercise such as chair aerobics 2-3 times a week and typically feels fatigued. He has noticed some edema in his legs but doesn't know if it improves by the next morning. On average his BP is around 130's systolic although his BP in clinic today is 94/50 and he is feeling fine. He denies any lightheadedness or recent falls. He also very rarely takes leave for pain management. He denies any palpitations, chest pain, shortness of breath. No lightheadedness, headaches, syncope, orthopnea, or PND.   Past Medical History:  Diagnosis Date   CAD in native artery 06/04/2020   RCA infarct 2013.  S/p RCA and OM PCI   Diabetes mellitus type 2 in nonobese (HCC) 06/04/2020   Diabetes mellitus without complication (HCC)    Essential hypertension 06/04/2020   Hypertension    Parkinson's disease (HCC)    Parkinson's disease (HCC) 06/04/2020   Pure hypercholesterolemia 06/04/2020    Past Surgical History:  Procedure Laterality Date   APPENDECTOMY     back epidural   08/2017   BACK SURGERY     CORONARY ANGIOPLASTY WITH STENT PLACEMENT  08/19/2012   KNEE  SURGERY       Current Outpatient Medications  Medication Sig Dispense Refill   aspirin 81 MG chewable tablet Chew by mouth.     carbidopa-levodopa (SINEMET IR) 25-100 MG tablet Take 1.5 tablets by mouth 5 (five) times daily. Take 1-1/2 pills at 9 AM, 12, 3 PM, 6 PM and 9 PM daily. 630 tablet 3   ciprofloxacin (CIPRO) 500 MG tablet Take 500 mg by mouth 2 (two) times daily.     lisinopril (ZESTRIL) 5 MG tablet Take 1 tablet (5 mg total) by mouth 2 (two) times daily. 18 tablet 3   methocarbamol (ROBAXIN) 500 MG tablet Take 1 tablet by mouth as needed.     metoprolol succinate (TOPROL-XL) 25 MG 24 hr tablet Take by mouth.     midodrine (PROAMATINE) 2.5 MG  tablet Take 1 tablet by mouth daily in the morning to help with low BP 30 tablet 1   naproxen sodium (ALEVE) 220 MG tablet Take 1 tablet by mouth as needed.     oxybutynin (DITROPAN) 5 MG tablet TAKE ONE TABLET BY MOUTH TWICE DAILY NEEDS  APPT     pioglitazone (ACTOS) 15 MG tablet daily.     rosuvastatin (CRESTOR) 20 MG tablet Take 1 tablet by mouth once daily 90 tablet 3   sertraline (ZOLOFT) 50 MG tablet Take 50 mg by mouth at bedtime.     No current facility-administered medications for this visit.    Allergies:   Patient has no known allergies.    Social History:  The patient  reports that he has quit smoking. He has never used smokeless tobacco. He reports current alcohol use. He reports that he does not use drugs.   Family History:  The patient's family history includes Diabetes in his mother; Sarcoidosis in his mother; Stroke in his father.    ROS:   Please see the history of present illness.  (+) LE edema (+) Back pain All other systems are reviewed and negative.    PHYSICAL EXAM: VS:  BP (!) 96/58 (BP Location: Left Arm, Patient Position: Sitting, Cuff Size: Normal)   Pulse 68   Ht 5\' 7"  (1.702 m)   Wt 177 lb 1.6 oz (80.3 kg)   BMI 27.74 kg/m  , BMI Body mass index is 27.74 kg/m. GENERAL:  Well appearing.  Voice soft.  HEENT:  Pupils equal round and reactive, fundi not visualized, oral mucosa unremarkable NECK:  No jugular venous distention, waveform within normal limits, carotid upstroke brisk and symmetric, no bruits LUNGS:  Clear to auscultation bilaterally HEART:  RRR.  PMI not displaced or sustained,S1 and S2 within normal limits, no S3, no S4, no clicks, no rubs, II/VI early-peaking systolic murmur at the LUSB ABD:  Flat, positive bowel sounds normal in frequency in pitch, no bruits, no rebound, no guarding, no midline pulsatile mass, no hepatomegaly, no splenomegaly EXT:  2 plus pulses throughout, No edema.  No cyanosis no clubbing SKIN:  No rashes no  nodules NEURO:  Cranial nerves II through XII grossly intact, motor grossly intact throughout PSYCH:  Cognitively intact, oriented to person place and time   EKG:  EKG is personally reviewed.  04/27/2022: Sinus rhythm. Rate 68 bpm. The ekg ordered today demonstrates sinus bradycardia.  Rate 58 bpm.  Prior inferior infarct.  LVH. 07/02/21: Sinus bradycardia.  Rate 58 bpm.  PAC.    Recent Labs: No results found for requested labs within last 365 days.   05/06/2020: Sodium 139, potassium 5.1, BUN 23,  creatinine 1.17 AST 19, ALT 10  03/25/2020: Total cholesterol 140, triglycerides 55, HDL 60, LDL 68 TSH 1.7  Lipid Panel No results found for: "CHOL", "TRIG", "HDL", "CHOLHDL", "VLDL", "LDLCALC", "LDLDIRECT"    Wt Readings from Last 3 Encounters:  04/27/22 177 lb 1.6 oz (80.3 kg)  02/10/22 174 lb (78.9 kg)  01/26/22 172 lb 12.8 oz (78.4 kg)      ASSESSMENT AND PLAN:  Essential hypertension BP has been somewhat labile still.  Overall he is feeling well.  On average it is running about in the 130s to 140s over 80s.  It is ranged from 102-163 on his home monitoring.  He has not had any lightheadedness or dizziness.  He also has not had any falls.  His blood pressure goal should be less than 140/90.  For now, recommend continuing with current management.  He takes midodrine in the morning and then his lisinopril has been split into twice daily dosing.  Continue metoprolol.  He was encouraged to continue with his exercise.  His blood pressure today was lower than it usually is at home and he is still asymptomatic.  CAD in native artery Prior RCA and OM PCI.  He has not had any angina.  He is doing a good job with continuing with his exercises.  Continue aspirin, metoprolol, and rosuvastatin.  Lipids were checked with his PCP last month and his LDL was at goal.  04/18/2022: Total cholesterol 144, triglycerides 52, HDL 67, LDL 66    Current medicines are reviewed at length with the patient  today.  The patient does not have concerns regarding medicines.  The following changes have been made: Reduce lisinopril as above.  Labs/ tests ordered today include:   Orders Placed This Encounter  Procedures   EKG 12-Lead      Disposition:   FU with  C. Duke Salvia, MD, [facility] in 6 months.   I,Jenifer Restaurant manager, fast food as a Neurosurgeon for [employer], MD.,have documented all relevant documentation on the behalf of Ronald Si, MD,as directed by  Ronald Si, MD while in the presence of Ronald Si, MD.  I,  C. Duke Salvia, MD have reviewed all documentation for this visit.  The documentation of the exam, diagnosis, procedures, and orders on 04/27/2022 are all accurate and complete.   Signed,  C. Duke Salvia, MD, [facility]  04/27/2022 1:37 PM    [facility]

## 2022-04-27 NOTE — Assessment & Plan Note (Signed)
Prior RCA and OM PCI.  He has not had any angina.  He is doing a good job with continuing with his exercises.  Continue aspirin, metoprolol, and rosuvastatin.  Lipids were checked with his PCP last month and his LDL was at goal.  04/18/2022: Total cholesterol 144, triglycerides 52, HDL 67, LDL 66

## 2022-04-27 NOTE — Assessment & Plan Note (Addendum)
BP has been somewhat labile still.  Overall he is feeling well.  On average it is running about in the 130s to 140s over 80s.  It is ranged from 102-163 on his home monitoring.  He has not had any lightheadedness or dizziness.  He also has not had any falls.  His blood pressure goal should be less than 140/90.  For now, recommend continuing with current management.  He takes midodrine in the morning and then his lisinopril has been split into twice daily dosing.  Continue metoprolol.  He was encouraged to continue with his exercise.  His blood pressure today was lower than it usually is at home and he is still asymptomatic.

## 2022-05-10 ENCOUNTER — Other Ambulatory Visit (HOSPITAL_BASED_OUTPATIENT_CLINIC_OR_DEPARTMENT_OTHER): Payer: Self-pay | Admitting: Cardiovascular Disease

## 2022-05-10 NOTE — Telephone Encounter (Signed)
Rx(s) sent to pharmacy electronically.  

## 2022-08-18 ENCOUNTER — Encounter: Payer: Self-pay | Admitting: Neurology

## 2022-08-18 ENCOUNTER — Ambulatory Visit: Payer: Medicare HMO | Admitting: Neurology

## 2022-08-18 VITALS — BP 136/74 | HR 62 | Ht 67.0 in | Wt 172.0 lb

## 2022-08-18 DIAGNOSIS — R269 Unspecified abnormalities of gait and mobility: Secondary | ICD-10-CM | POA: Diagnosis not present

## 2022-08-18 DIAGNOSIS — G8929 Other chronic pain: Secondary | ICD-10-CM

## 2022-08-18 DIAGNOSIS — M545 Low back pain, unspecified: Secondary | ICD-10-CM

## 2022-08-18 DIAGNOSIS — Z9181 History of falling: Secondary | ICD-10-CM

## 2022-08-18 DIAGNOSIS — G20B2 Parkinson's disease with dyskinesia, with fluctuations: Secondary | ICD-10-CM | POA: Diagnosis not present

## 2022-08-18 DIAGNOSIS — G510 Bell's palsy: Secondary | ICD-10-CM

## 2022-08-18 NOTE — Progress Notes (Signed)
Subjective:    Patient ID: Ronald [name] is a 71 y.o. male.  HPI    Interim history:   Ronald [name] is a 71 year old left-handed gentleman with an underlying medical history of diabetes, Bell's palsy some 10+ years ago, overweight state, CAD (s/p MI in 2013, s/p 3 stents), and hypertension who presents for follow-up consultation of his right-sided predominant Parkinson's disease, complicated by back pain, knee pain, bladder incontinence, history of fall and constipation.  The patient is unaccompanied today.  I last saw him on 02/10/2022, at which time he reported feeling fairly stable from the Parkinson's standpoint, he was taking Sinemet 1-1/2 pills 5 times a day but it was difficult to cut the pills in half.  He had a cystoscopy with Botox under urology on 01/21/2022, but still had significant nocturia about 4-5 times per average night.  Also had ongoing issues with low back pain.   Today, 08/18/22: He reports reports feeling stable, still has back pain but tolerates it.  He uses a cane, has avoided using walker.  His birthday is tomorrow.  His wife is currently in [place], [place] to be with her uncle who is 42 years old and in the hospital.  Her mom is also there currently.  Wife's uncle is going to be honored for being the first Journalist, newspaper at [employer] in [place], they are going to build a statue and put it on campus, unfortunately he is in the hospital right now. Patient has had some balance issues and stumbles, he has wondered about a walker but his wife has felt that he should not get a walker so he has avoided it.  His wife will need hip replacement surgery soon.  He continues to take Sinemet 1-1/2 pills 5 times a day, 3 hourly starting at 9 AM.  He would be willing to go to 2 pills alternating with 1 to avoid cutting the pills in half.  The patient's allergies, current medications, family history, past medical history, past social history, past surgical history and  problem list were reviewed and updated as appropriate.    Previously (copied from previous notes for reference):        I saw him on 10/07/2021, at which time he reported that increasing Sinemet to 4 times a day caused his joint pain to be worse.  He was on 2 pills 3 times daily.  I suggested he change his Sinemet regimen to 1-1/2 pills 5 times a day starting at 9 AM, 3 hourly. He had recently started [facility] per Cardiology.     I saw him on 06/03/2021, at which time he reported significant left knee pain.  He was utilizing a prescription knee brace.  He was using a cane.  He had a recent fall, landed on his back but did not hit his head and sustained no serious injuries thankfully.  Constipation was under decent control.  He was advised to increase his Sinemet from 1-1/2 pills 4 times daily to 2 pills in the morning and 2 pills in the evening, maintain 1-1/2 pills for the midday doses.    I saw him on 01/27/2021, at which time he reported doing fairly well, he had ongoing issues with knee pain and low back pain.  He reported having fallen.  He had not used a walker and he was not keen on starting to use a walker.  We talked about potentially utilizing [place]z.     I saw him on 07/30/2020, at which  time he reported feeling fairly stable.  He was taking Sinemet 2 pills 3 times daily.  He was trying to stay active and go to the gym about twice a week.  He reported occasional coughing when drinking water but did not want to seek formal evaluation with speech therapy or swallow study.  He had noticed some swelling around his ankles.  He was switched by his cardiologist from Zocor to Crestor. He was advised to continue with generic Sinemet 2 pills 3 times daily.   I saw him on 01/28/2020, at which time he reported ongoing issues with intermittent constipation.  He had fallen a couple of times but thankfully without any major injuries.  We talked about the importance of fall prevention and constipation  control.  He was not able to tolerate Sinemet at 2 pills 4 times daily and had scaled back to 2 pills 3 times daily.  He was advised to continue with this. He was advised to use his cane consistently.   I saw him on 09/30/2019, at which time he reported that his balance was worse and he had 2 falls because his left knee gave out.  He had arthritis issues with the knees and is seen orthopedics.  He had seen neurosurgery in [place] in September 2020.  He felt that overall his slowness was worse.  He was advised to increase his Sinemet to 2 pills 4 times daily from tid.      I saw him on 05/30/2019, at which time he reported ongoing problems with his low back pain.  He had tried medication including narcotic pain medication, muscle relaxer and gabapentin.  He was supposed to have a neurosurgical evaluation soon.  He had problems with constipation.  He was still taking Sinemet 2 pills 3 times daily with reasonably good results.  He also reported overall feeling more stressed.  He was advised to be more proactive about constipation issues and try to be more active physically with the help of a recumbent stationary bike if possible.   I saw him on 11/27/2018, at which time he was complaining of low back pain.  He had lumbar spine x-rays in November 2019 which showed some degenerative changes.  He had received a lumbar spine injection under Dr. Durward Fortes in December which he felt was not that helpful.  He felt that the back pain was enough to limit his exercise.  He had not pursued his hobby of making pens very much either.  He was advised to continue with Sinemet 2 pills 3 times daily.  He was advised to try gabapentin low dose with gradual increase to up to 100 mg 3 times daily.    I saw him on 05/29/2018, at which time he reported having done well after interim back surgery on 03/06/2018. He also had radiofrequency surgery prior to that. He reported occasional dizziness and nausea, some loss of appetite. I  suggested we change his Sinemet from 2 pills 3 times a day to 1-1/2 pills 4 times a day. He reported falls. He was physically quite active, was going to the [facility]. His falls primarily occurred when he was turning.    11/02/2017, at which time he reported more back pain. He had undergone an injection in December 2018 but had found no relief. [place]. He was active physically but had to take a break because of back pain. He was taking Mobic for back pain. He had a slow reduction and his hemoglobin and red cell  counts and I counseled him on the daily use of meloxicam.     I saw him on 05/02/2017, at which time he was complaining of lower back pain. He had seen orthopedics for this. He also had right knee pain, status post injection. He had not fallen thankfully. He was taking Sinemet 2 pills 3 times a day.   I saw him on 10/31/2016, at which time he reported doing okay, no recent new symptoms, in particular no significant memory loss or mood disorder constipation. He did have an episode of chest pain recently. He saw his cardiologist. He felt to be a little dehydrated and blood pressure was on the lower end of normal. He was noted to have some orthostatic hypotension. He was started on northera 100 mg tid, but had not filled it yet. He was trying to stay active and going to the [facility] 3 times a week. He was also interested in pursuing boxing classes for PD. He had lost a little bit of weight and reported some loss of appetite. I suggested he continue with Sinemet 2 pills 3 times a day. Advised to change positions slowly and be mindful of his blood pressure dropping potentially with sudden changes of position and he was reminded to stay well-hydrated.   I first met him on 04/27/2016 at the request of his primary care physician, at which time he reported a prior diagnosis of Parkinson's disease in 2014 and symptoms dating back to early 2014. He was exercising regularly, he was doing well on symptomatic  treatment with Sinemet and I suggested he continue with the medication, at 2 pills 3 times a day. He was reminded to stay active physically and mentally and stable hydrated and well-nourished.    04/27/2016: He was diagnosed with Parkinson's disease in 2014. Symptoms date back to beginning of 2014, and started with speech changes, was noted to dragging his feet.  He has been on Sinemet for the past 3 years, with gradual increase, currently 2 pills in the morning, 2 in the afternoon and 1 at night, was asked to reduce it, He is not sure why, seems to tolerate it well and believes it has helped. He is wondering however if there is any new medication available. He has not been on a dopamine agonist before from what I understand, no Azilect, no other MAO B inhibitor either.  He exercises regularly and is enrolled in the PD spinning class at 2 different [employer]. Lives with his wife of 33 years in [place], and they have 3 grown daughters, 2 GC. He is a nonsmoker, drinks alcohol infrequently, is a retired Geophysicist/field seismologist.  I reviewed your office note from 02/08/2016. He used to see Dr. Marijean Bravo, neurologist out of [facility] but Dr. Marijean Bravo retired. He then saw another neurologist in the same office but she left. Prior office records from his previous neurologists are not available for my review today. We will request office records from Dr. Barbera Setters office, patient signed a ROI form today and is agreeable.   He denies any major memory or mood issues or sleep issues, does snore some, but no apneas reported, no hx of RBD. No FHx of PD.  He varies his C/L dose, 1st dose 6-9 AM, second dose around 12 or 1 PM and last dose around 8 PM.    He also plays golf, once or twice a week. He tries to stay well-hydrated, may not always drink enough water however.    His Past Medical History Is Significant  For: Past Medical History:  Diagnosis Date   CAD in native artery 06/04/2020   RCA infarct 2013.  S/p RCA and OM PCI    Diabetes mellitus type 2 in nonobese ([place]) 06/04/2020   Diabetes mellitus without complication ([place])    Essential hypertension 06/04/2020   Hypertension    Parkinson's disease    Parkinson's disease 06/04/2020   Pure hypercholesterolemia 06/04/2020    His Past Surgical History Is Significant For: Past Surgical History:  Procedure Laterality Date   APPENDECTOMY     back epidural   08/2017   BACK SURGERY     CORONARY ANGIOPLASTY WITH STENT PLACEMENT  08/19/2012   KNEE SURGERY      His Family History Is Significant For: Family History  Problem Relation Age of Onset   Diabetes Mother    Sarcoidosis Mother    Stroke Father    Parkinson's disease Neg Hx     His Social History Is Significant For: Social History   Socioeconomic History   Marital status: Married    Spouse name: Memory Dance    Number of children: 3   Years of education: Not on file   Highest education level: Not on file  Occupational History   Occupation: Retired   Tobacco Use   Smoking status: Former   Smokeless tobacco: Never  Scientific laboratory technician Use: Never used  Substance and Sexual Activity   Alcohol use: Yes    Comment: occ glass of wine for a special occasion   Drug use: No   Sexual activity: Not on file  Other Topics Concern   Not on file  Social History Narrative   Drinks about 1 cup of coffee a day    Lives with wife Kennyth Lose   Left handed   Social Determinants of Health   Financial Resource Strain: Not on file  Food Insecurity: Not on file  Transportation Needs: Not on file  Physical Activity: Not on file  Stress: Not on file  Social Connections: Not on file    His Allergies Are:  No Known Allergies:   His Current Medications Are:  Outpatient Encounter Medications as of 08/18/2022  Medication Sig   aspirin 81 MG chewable tablet Chew 81 mg by mouth daily.   carbidopa-levodopa (SINEMET IR) 25-100 MG tablet Take 1.5 tablets by mouth 5 (five) times daily. Take 1-1/2 pills at 9 AM, 12, 3 PM, 6  PM and 9 PM daily.   lisinopril (ZESTRIL) 5 MG tablet Take 1 tablet by mouth once daily   methocarbamol (ROBAXIN) 500 MG tablet Take 1 tablet by mouth as needed.   metoprolol succinate (TOPROL-XL) 25 MG 24 hr tablet Take 25 mg by mouth daily.   midodrine (PROAMATINE) 2.5 MG tablet Take 1 tablet by mouth daily in the morning to help with low BP   naproxen sodium (ALEVE) 220 MG tablet Take 1 tablet by mouth as needed.   pioglitazone (ACTOS) 15 MG tablet daily.   rosuvastatin (CRESTOR) 20 MG tablet Take 1 tablet by mouth once daily   sertraline (ZOLOFT) 50 MG tablet Take 50 mg by mouth at bedtime.   ciprofloxacin (CIPRO) 500 MG tablet Take 500 mg by mouth 2 (two) times daily. (Patient not taking: Reported on 08/18/2022)   oxybutynin (DITROPAN) 5 MG tablet TAKE ONE TABLET BY MOUTH TWICE DAILY NEEDS  APPT (Patient not taking: Reported on 08/18/2022)   No facility-administered encounter medications on file as of 08/18/2022.  :  Review of Systems:  Out of  a complete 14 point review of systems, all are reviewed and negative with the exception of these symptoms as listed below:   Review of Systems  Neurological:        Patient is here alone for a 6 month follow-up. Patient feels his symptoms are about the same. He states he falls of the time but he doesn't get hurt. He tries to use his cane all of the time.     Objective:  Neurological Exam  Physical Exam Physical Examination:   Vitals:   08/18/22 1319  BP: 136/74  Pulse: 62    General Examination: The patient is a very pleasant 71 y.o. male in no acute distress. He appears well-developed and well-nourished and well groomed.   HEENT: Normocephalic, atraumatic, pupils are equal, round and reactive to light, extraocular tracking is mildly impaired, face is asymmetric with fairly stable appearing left facial weakness and occasional blepharospasm and L hemifacial spasm noted, stable.  He has moderate facial masking overall, moderate to severe  hypophonia and mild intermittent dysarthria, stable.  Neck is moderately rigid.  Airway examination reveals stable findings, mild mouth dryness, adequate dental hygiene, tongue protrudes centrally and palate elevates symmetrically.  No obvious sialorrhea.  No carotid bruits.   Chest: Clear to auscultation without wheezing, rhonchi or crackles noted.   Heart: S1+S2+0, regular and normal without murmurs, rubs or gallops noted.    Abdomen: Soft, non-tender and non-distended.   Extremities: There is no pitting edema in the lower extremities today.    Skin: Warm and dry without trophic changes noted.   Musculoskeletal: exam reveals L knee pain and mild left knee swelling.  Decreased range of motion left shoulder with crepitation noted.     Neurologically:  Mental status: The patient is awake, alert and oriented in all 4 spheres. His immediate and remote memory, attention, language skills and fund of knowledge are appropriate. There is no evidence of aphasia, agnosia, apraxia or anomia. Thought process is linear. Mood is normal and affect is normal.  Cranial nerves II - XII are as described above under HEENT exam.  Motor exam: Normal bulk, strength for age, he has a mild intermittent right upper extremity resting tremor.  No dyskinesia noted today. He has moderate difficulty with fine motor skills on the right including finger taps, hand movements and rapid alternating patting, foot taps and foot agility as well. On the left, he has overall milder findings. Sensory exam intact to light touch throughout. Overall moderate bradykinesia. Gait, station, balance: He stands with mild difficulty, and pushes himself up, slightly wider stance, he has a moderately stooped posture, mild right upper body tilt.  He has a single-point cane on the left side, walks with a limp, but stable.   Assessment and Plan:    In summary, Ronald [name] is a very pleasant 71 year old left-handed gentleman with an underlying  medical history of coronary artery disease, status post stent placement, history of left-sided Bell's palsy, overweight state, degenerative back d/s, with s/p ESI, arthritis with s/p knee injections, hypertension, type 2 diabetes, and status post lumbar spine surgery in June 2019, who presents for follow-up consultation of his right-sided predominant Parkinson's disease, complicated by low back pain, knee pain, left shoulder pain, constipation, bladder hyperactivity, status post Botox injection, and history of falls, intermittent dyskinesias. He has seen Dr. Durward Fortes for his arthritis. He has been using a cane more consistently and a knee brace as needed. We again talked about his fall risk and the importance  of fall prevention.  He has considered a walker but is not quite ready yet for a prescription, he is encouraged to call us if he would like a prescription for a rollator.  He has had physical therapy.  He has been on Sinemet, currently 1-1/2 pills 5 times a day, we changed this in January 2023. Of note when he increased Sinemet to 2 pills 4 times a day he could not tolerate it.  At this juncture, to avoid splitting pills, he is advised to take 2 pills alternating with 1 pill for total of 8 pills a day, which is a slight increase.  If he develops lightheadedness or dyskinesias, he may need to go back to taking 1-1/2 pills 5 times a day.  He does not take the ProAmatine consistently. He tries to stay active physically. He is advised to follow-up in 6 months to see one of our nurse practitioners, sooner if needed. I answered all his questions today and he was in agreement.   I spent 30 minutes in total face-to-face time and in reviewing records during pre-charting, more than 50% of which was spent in counseling and coordination of care, reviewing test results, reviewing medications and treatment regimen and/or in discussing or reviewing the diagnosis of PD, the prognosis and treatment options. Pertinent  laboratory and imaging test results that were available during this visit with the patient were reviewed by me and considered in my medical decision making (see chart for details).

## 2022-08-18 NOTE — Patient Instructions (Signed)
It was nice to see you again today. Please consider using a walker, I would be happy to prescribe a rollator.  Please change the Sinemet to 2 pills alternating with 1 pill, that way you can avoid cutting pills.  Take 2 pills at 9 AM, 1 pill at 12, 2 pills at 3 PM, 1 pill at 6 PM, and 2 pills at 9 PM daily.  This will be a total of 8 pills which is a slight increase from currently taking 7.5 pills daily.  I can change your prescription when it is due for a refill, as you have plenty currently.  Please follow-up to see one of our nurse practitioners in 6 months routinely.

## 2022-09-07 ENCOUNTER — Telehealth: Payer: Self-pay | Admitting: Orthopaedic Surgery

## 2022-09-07 NOTE — Telephone Encounter (Signed)
Received medical records release form from patient  

## 2022-10-25 ENCOUNTER — Telehealth (HOSPITAL_BASED_OUTPATIENT_CLINIC_OR_DEPARTMENT_OTHER): Payer: Self-pay | Admitting: Cardiovascular Disease

## 2022-10-25 NOTE — Telephone Encounter (Signed)
   Name: Ronald [name]  DOB: 03-27-1951  MRN: [id number]  Primary Cardiologist: None  Chart reviewed as part of pre-operative protocol coverage. Because of [name] past medical history and time since last visit, he will require a follow-up in-office visit in order to better assess preoperative cardiovascular risk.  Pre-op covering staff: - Please schedule appointment and call patient to inform them. If patient already had an upcoming appointment within acceptable timeframe, please add "pre-op clearance" to the appointment notes so provider is aware. - Please contact requesting surgeon's office via preferred method (i.e, phone, fax) to inform them of need for appointment prior to surgery.   Lenna Sciara, NP  10/25/2022, 1:55 PM

## 2022-10-25 NOTE — Telephone Encounter (Signed)
   Pre-operative Risk Assessment    Patient Name: Ronald [name]  DOB: 01/11/1951 MRN: [id number]      Request for Surgical Clearance    Procedure:   Turp Transurethral Resection of the prostate  Date of Surgery:  Clearance 11/08/22                                 Surgeon:  Dr. Emogene Morgan Group or Practice Name:  [name]  Phone number:  [phone] Fax number:  [phone]   Type of Clearance Requested:   - Medical    Type of Anesthesia:  General or Spinal    Additional requests/questions:    SignedTrilby Drummer   10/25/2022, 1:15 PM

## 2022-10-25 NOTE — Telephone Encounter (Signed)
Follow up appt scheduled with Nicholes Rough, PA; patient agreeable and voiced understanding.

## 2022-10-30 NOTE — Progress Notes (Addendum)
Office Visit    Patient Name: Ronald [name] Date of Encounter: 10/31/2022  PCP:  Marjory Sneddon, [place]  Cardiologist:  None  Advanced Practice Provider:  No care team member to display Electrophysiologist:  None   HPI    Ronald [name] is a 72 y.o. male with past medical history of CAD status post PCI, diabetes, hyperlipidemia, hypertension, Parkinson's disease and depression presents today for follow-up appointment.  History includes MI back in 2013.  Three stents placed at that time in the RCA and OM.  Had done well with no angina.  Received care at [facility] and elected to transfer.  Had [facility] 02/2018 that revealed mild posterior lateral ischemia and LVEF 65%.  He is struggled with chronic back pain that limits his ability to exercise.  He had surgery on his back but still has some weakness and pain.  He was trying to get some exercise by walking.    No exertional chest pain or shortness of breath when he was last seen 04/27/2022.  He had complained of some muscle cramping in his thighs and pain in his left knee.  He had chronic lower extremity edema without orthopnea or PND.  He tried to elevate his legs when sitting but does not use compression socks.  Quit smoking in the early 1990s.  He like to traveling to [place] often but had not been since COVID-19.  Career as a Librarian, academic.  Patient had myalgias on simvastatin and was switched to rosuvastatin.  Echo 06/2020 that revealed LVEF 60 to 65% with normal diastolic function and aortic valve sclerosis without stenosis.  His blood pressure was well-controlled but he was endorsing some orthostatic symptoms.  Lisinopril was reduced.  Followed by our pharmacist and midodrine was added for low blood pressures in the morning.  He was instructed to take it for systolic BP less than [id number].  He was last seen August 2023 and was doing well at that time other  than some back pain.  He was doing chair exercises 2-3 times a week and typically felt fatigued.  Had noticed some edema in his legs but did not know if it improved in the morning.  Average BP around [id number] systolic although BP in the clinic was 94/50.  He was asymptomatic.  Today, he states he has some falls. No chest pains, SOB, and no palpitations. He uses a cane for support when walking even shorter distances.  He gets fatigued with stairs but can do it. Left knee is bone on bone. Fatigue has gotten worse in the last few months and weakness.  He has not had an echocardiogram in a long time.  He has scored a 5.07 on the DASI.  This exceeds the 4 METS minimum requirement.  Reports no chest pain, pressure, or tightness. No edema, orthopnea, PND. Reports no palpitations.    Past Medical History    Past Medical History:  Diagnosis Date   CAD in native artery 06/04/2020   RCA infarct 2013.  S/p RCA and OM PCI   Diabetes mellitus type 2 in nonobese ([place]) 06/04/2020   Diabetes mellitus without complication ([place])    Essential hypertension 06/04/2020   Hypertension    Parkinson's disease    Parkinson's disease 06/04/2020   Pure hypercholesterolemia 06/04/2020   Past Surgical History:  Procedure Laterality Date   APPENDECTOMY     back epidural   08/2017   BACK SURGERY  CORONARY ANGIOPLASTY WITH STENT PLACEMENT  08/19/2012   KNEE SURGERY      Allergies  No Known Allergies   EKGs/Labs/Other Studies Reviewed:   The following studies were reviewed today:  Echo 06/29/20 IMPRESSIONS     1. Left ventricular ejection fraction, by estimation, is 60 to 65%. The  left ventricle has normal function. The left ventricle has no regional  wall motion abnormalities. Left ventricular diastolic parameters were  normal.   2. Right ventricular systolic function is normal. The right ventricular  size is mildly enlarged. There is normal pulmonary artery systolic  pressure. The estimated right  ventricular systolic pressure is 0000000 mmHg.   3. The mitral valve is normal in structure. Trivial mitral valve  regurgitation.   4. The aortic valve is tricuspid. Aortic valve regurgitation is trivial.  Mild aortic valve sclerosis is present, with no evidence of aortic valve  stenosis.   5. The inferior vena cava is normal in size with greater than 50%  respiratory variability, suggesting right atrial pressure of 3 mmHg.   FINDINGS   Left Ventricle: Left ventricular ejection fraction, by estimation, is 60  to 65%. The left ventricle has normal function. The left ventricle has no  regional wall motion abnormalities. The left ventricular internal cavity  size was normal in size. There is   no left ventricular hypertrophy. Left ventricular diastolic parameters  were normal.   Right Ventricle: The right ventricular size is mildly enlarged. Right  vetricular wall thickness was not assessed. Right ventricular systolic  function is normal. There is normal pulmonary artery systolic pressure.  The tricuspid regurgitant velocity is  2.39 m/s, and with an assumed right atrial pressure of 3 mmHg, the  estimated right ventricular systolic pressure is 0000000 mmHg.   Left Atrium: Left atrial size was normal in size.   Right Atrium: Right atrial size was normal in size.   Pericardium: There is no evidence of pericardial effusion.   Mitral Valve: The mitral valve is normal in structure. Trivial mitral  valve regurgitation.   Tricuspid Valve: The tricuspid valve is normal in structure. Tricuspid  valve regurgitation is trivial.   Aortic Valve: The aortic valve is tricuspid. Aortic valve regurgitation is  trivial. Aortic regurgitation PHT measures 990 msec. Mild aortic valve  sclerosis is present, with no evidence of aortic valve stenosis.   Pulmonic Valve: The pulmonic valve was not well visualized. Pulmonic valve  regurgitation is not visualized.   Aorta: The aortic root and ascending aorta  are structurally normal, with  no evidence of dilitation.   Venous: The inferior vena cava is normal in size with greater than 50%  respiratory variability, suggesting right atrial pressure of 3 mmHg.   IAS/Shunts: The interatrial septum was not well visualized.    EKG:  EKG is  ordered today.  The ekg ordered today demonstrates normal sinus rhythm, rate 63 bpm  Recent Labs: No results found for requested labs within last 365 days.  Recent Lipid Panel No results found for: "CHOL", "TRIG", "HDL", "CHOLHDL", "VLDL", "LDLCALC", "LDLDIRECT"   Home Medications   Current Meds  Medication Sig   aspirin 81 MG chewable tablet Chew 81 mg by mouth daily.   carbidopa-levodopa (SINEMET IR) 25-100 MG tablet Take 1.5 tablets by mouth 5 (five) times daily. Take 1-1/2 pills at 9 AM, 12, 3 PM, 6 PM and 9 PM daily.   furosemide (LASIX) 20 MG tablet Take 1 tablet (20 mg total) by mouth daily as needed (for lower  extremity edema or weight gain of 2 lbs or more overnight or 5 lbs or more in 1 week).   lisinopril (ZESTRIL) 5 MG tablet Take 1 tablet by mouth once daily   metoprolol succinate (TOPROL-XL) 25 MG 24 hr tablet Take 25 mg by mouth daily.   naproxen sodium (ALEVE) 220 MG tablet Take 1 tablet by mouth as needed.   pioglitazone (ACTOS) 15 MG tablet daily.   rosuvastatin (CRESTOR) 20 MG tablet Take 1 tablet by mouth once daily   sertraline (ZOLOFT) 50 MG tablet Take 50 mg by mouth at bedtime.   sulfamethoxazole-trimethoprim (BACTRIM DS) 800-160 MG tablet Take 1 tablet by mouth 2 (two) times daily.   tadalafil (CIALIS) 5 MG tablet Take 5 mg by mouth daily as needed.     Review of Systems      All other systems reviewed and are otherwise negative except as noted above.  Physical Exam    VS:  BP 110/74   Pulse 63   Ht 5' 7"$  (1.702 m)   Wt 181 lb (82.1 kg)   BMI 28.35 kg/m  , BMI Body mass index is 28.35 kg/m.  Wt Readings from Last 3 Encounters:  10/31/22 181 lb (82.1 kg)  08/18/22 172  lb (78 kg)  04/27/22 177 lb 1.6 oz (80.3 kg)     GEN: Well nourished, well developed, in no acute distress. HEENT: normal. Neck: Supple, no JVD, carotid bruits, or masses. Cardiac: RRR, no murmurs, rubs, or gallops. No clubbing, cyanosis, edema.  Radials/PT 2+ and equal bilaterally.  Respiratory: Respirations regular and unlabored, clear to auscultation bilaterally. GI: Soft, nontender, nondistended. MS: No deformity or atrophy. Skin: Warm and dry, no rash. Neuro:  Strength and sensation are intact. Psych: Normal affect.  Assessment & Plan    Preop Evaluation Mr. Suniga perioperative risk of a major cardiac event is 0.9% according to the Revised Cardiac Risk Index (RCRI).  Therefore, he is at low risk for perioperative complications.   His functional capacity is good at 5.07 METs according to the Duke Activity Status Index (DASI). Recommendations: Stable echo. Okay to proceed without further testing.  Antiplatelet and/or Anticoagulation Recommendations: The patient should remain on Aspirin without interruption.    Essential hypertension -had issues with hypotension in the past -well controlled today -Continue current medications including aspirin 81 mg, lisinopril 5 mg daily, metoprolol succinate 25 mg daily, Crestor 20 mg daily  CAD -no chest pain but having some increased SOB and fatigue -will check an echo  LE edema -PRN lasix ordered today -will need BMP at next visit         Disposition: Follow up 6 month with Dr. Oval Linsey or APP.  Signed, Elgie Collard, PA-C 10/31/2022, 12:35 PM [facility]

## 2022-10-31 ENCOUNTER — Ambulatory Visit: Payer: Medicare HMO | Attending: Physician Assistant | Admitting: Physician Assistant

## 2022-10-31 ENCOUNTER — Encounter: Payer: Self-pay | Admitting: Physician Assistant

## 2022-10-31 VITALS — BP 110/74 | HR 63 | Ht 67.0 in | Wt 181.0 lb

## 2022-10-31 DIAGNOSIS — R5383 Other fatigue: Secondary | ICD-10-CM | POA: Diagnosis not present

## 2022-10-31 DIAGNOSIS — I2510 Atherosclerotic heart disease of native coronary artery without angina pectoris: Secondary | ICD-10-CM | POA: Diagnosis not present

## 2022-10-31 DIAGNOSIS — R600 Localized edema: Secondary | ICD-10-CM

## 2022-10-31 DIAGNOSIS — I10 Essential (primary) hypertension: Secondary | ICD-10-CM

## 2022-10-31 DIAGNOSIS — R0602 Shortness of breath: Secondary | ICD-10-CM

## 2022-10-31 MED ORDER — FUROSEMIDE 20 MG PO TABS: 20.0000 mg | ORAL_TABLET | Freq: Every day | ORAL | 3 refills | Status: DC | PRN

## 2022-10-31 NOTE — Patient Instructions (Signed)
Medication Instructions:  1.Start furosemide (lasix) 20 mg as needed for lower extremity edema or weight gain of 2 lbs or more overnight or 5 lbs or more in a week  *If you need a refill on your cardiac medications before your next appointment, please call your pharmacy*   Lab Work: None ordered If you have labs (blood work) drawn today and your tests are completely normal, you will receive your results only by: [place] (if you have MyChart) OR A paper copy in the mail If you have any lab test that is abnormal or we need to change your treatment, we will call you to review the results.   Testing/Procedures: Your physician has requested that you have an echocardiogram. Echocardiography is a painless test that uses sound waves to create images of your heart. It provides your doctor with information about the size and shape of your heart and how well your heart's chambers and valves are working. This procedure takes approximately one hour. There are no restrictions for this procedure. Please do NOT wear cologne, perfume, aftershave, or lotions (deodorant is allowed). Please arrive 15 minutes prior to your appointment time.    Follow-Up: At [facility], you and your health needs are our priority.  As part of our continuing mission to provide you with exceptional heart care, we have created designated Provider Care Teams.  These Care Teams include your primary Cardiologist (physician) and Advanced Practice Providers (APPs -  Physician Assistants and Nurse Practitioners) who all work together to provide you with the care you need, when you need it.   Your next appointment:   August 2024  Provider:   Skeet Latch, MD

## 2022-11-02 ENCOUNTER — Other Ambulatory Visit (HOSPITAL_COMMUNITY): Payer: Medicare HMO

## 2022-11-03 ENCOUNTER — Ambulatory Visit (HOSPITAL_COMMUNITY): Payer: Medicare HMO | Attending: Cardiology

## 2022-11-03 DIAGNOSIS — R0602 Shortness of breath: Secondary | ICD-10-CM | POA: Diagnosis present

## 2022-11-03 DIAGNOSIS — R5383 Other fatigue: Secondary | ICD-10-CM | POA: Insufficient documentation

## 2022-11-03 DIAGNOSIS — R600 Localized edema: Secondary | ICD-10-CM | POA: Diagnosis not present

## 2022-11-03 LAB — ECHOCARDIOGRAM COMPLETE
Area-P 1/2: 3.72 cm2
S' Lateral: 2.7 cm

## 2022-11-03 NOTE — Telephone Encounter (Signed)
Will forward this note to Nicholes Rough, NP who saw the pt and ordered an echo for pre op clearance. Echo was done today. If pt has been cleared NP will fax her notes to surgeon office giving clearance and recommendations.

## 2022-11-03 NOTE — Telephone Encounter (Signed)
Calling for update. Asking that the clearance be fax to her [phone]. Please advise

## 2022-11-04 ENCOUNTER — Telehealth: Payer: Self-pay | Admitting: Cardiovascular Disease

## 2022-11-04 NOTE — Telephone Encounter (Signed)
[facility] called to talk with Dr. Oval Linsey or nurse in regards to the patient's medical clearance form. [place] [phone]

## 2022-11-04 NOTE — Telephone Encounter (Signed)
Returned call to [facility] where patient will be having surgery. They are needing his office visit where preop clearance was provided faxed over to [facility] at [phone], [place] faxed.

## 2022-11-18 ENCOUNTER — Other Ambulatory Visit: Payer: Self-pay | Admitting: Cardiovascular Disease

## 2022-11-21 NOTE — Telephone Encounter (Signed)
Rx(s) sent to pharmacy electronically.  

## 2023-01-17 ENCOUNTER — Other Ambulatory Visit: Payer: Self-pay | Admitting: Neurology

## 2023-03-01 ENCOUNTER — Ambulatory Visit: Payer: Medicare HMO | Admitting: Adult Health

## 2023-03-01 ENCOUNTER — Encounter: Payer: Self-pay | Admitting: Adult Health

## 2023-03-01 VITALS — BP 114/66 | HR 59 | Ht 67.0 in | Wt 175.8 lb

## 2023-03-01 DIAGNOSIS — G20B2 Parkinson's disease with dyskinesia, with fluctuations: Secondary | ICD-10-CM | POA: Diagnosis not present

## 2023-03-01 DIAGNOSIS — R296 Repeated falls: Secondary | ICD-10-CM

## 2023-03-01 NOTE — Progress Notes (Signed)
PATIENT: Ronald [name] DOB: 1951/03/13  REASON FOR VISIT: follow up HISTORY FROM: patient PRIMARY NEUROLOGIST:   Chief Complaint  Patient presents with   Follow-up    Pt in 20 with wife Pt here for Parkinson f/u Pt states decline in gait and balance Pt states 6 falls since last visit Pt walking with cane Wife has questions on a study for parkinson  at [facility]      HISTORY OF PRESENT ILLNESS: Today 03/01/23  Ronald [name] is a 72 y.o. male who has been followed in this office for Parkinson's disease. Returns today for follow-up.  He reports that he has had more trouble with his gait and balance.  Has had approximately 6 falls.  Currently using a cane when ambulating.  Reports that it is rare that he has a tremor.  But he has noticed it in the upper extremities at times.  Reports that he is sleeping well.  His wife reports that sometimes he is acting out his dreams at night.  He has had 1 occasion that he fell out of bed.  He remains on Sinemet.  They may be interested in research program specifically asking about [facility].  HISTORY Frances Furbish) 08/18/22: He reports reports feeling stable, still has back pain but tolerates it.  He uses a cane, has avoided using walker.  His birthday is tomorrow.  His wife is currently in [place], [place] to be with her uncle who is 48 years old and in the hospital.  Her mom is also there currently.  Wife's uncle is going to be honored for being the first Geologist, engineering at [employer] in [place], they are going to build a statue and put it on campus, unfortunately he is in the hospital right now. Patient has had some balance issues and stumbles, he has wondered about a walker but his wife has felt that he should not get a walker so he has avoided it.  His wife will need hip replacement surgery soon.  He continues to take Sinemet 1-1/2 pills 5 times a day, 3 hourly starting at 9 AM.  He would be willing to go to 2 pills  alternating with 1 to avoid cutting the pills in half.  REVIEW OF SYSTEMS: Out of a complete 14 system review of symptoms, the patient complains only of the following symptoms, and all other reviewed systems are negative.  ALLERGIES: No Known Allergies  HOME MEDICATIONS: Outpatient Medications Prior to Visit  Medication Sig Dispense Refill   aspirin 81 MG chewable tablet Chew 81 mg by mouth daily.     carbidopa-levodopa (SINEMET IR) 25-100 MG tablet Take by mouth 2 pills at 9 AM, 1 pill at 12, 2 pills at 3 PM, 1 pill at 6 PM, and 2 pills at 9 PM daily. 720 tablet 0   lisinopril (ZESTRIL) 5 MG tablet Take 1 tablet by mouth once daily 90 tablet 2   metoprolol succinate (TOPROL-XL) 25 MG 24 hr tablet Take 25 mg by mouth daily.     midodrine (PROAMATINE) 2.5 MG tablet Take 1 tablet by mouth daily in the morning to help with low BP 30 tablet 1   naproxen sodium (ALEVE) 220 MG tablet Take 1 tablet by mouth as needed.     rosuvastatin (CRESTOR) 20 MG tablet Take 1 tablet by mouth once daily 90 tablet 2   sertraline (ZOLOFT) 50 MG tablet Take 50 mg by mouth at bedtime.     tadalafil (CIALIS)  5 MG tablet Take 5 mg by mouth daily as needed.     furosemide (LASIX) 20 MG tablet Take 1 tablet (20 mg total) by mouth daily as needed (for lower extremity edema or weight gain of 2 lbs or more overnight or 5 lbs or more in 1 week). 45 tablet 3   pioglitazone (ACTOS) 15 MG tablet daily.     No facility-administered medications prior to visit.    PAST MEDICAL HISTORY: Past Medical History:  Diagnosis Date   CAD in native artery 06/04/2020   RCA infarct 2013.  S/p RCA and OM PCI   Diabetes mellitus type 2 in nonobese ([facility]CC) 06/04/2020   Diabetes mellitus without complication ([facility])    Essential hypertension 06/04/2020   Hypertension    Parkinson's disease    Parkinson's disease 06/04/2020   Pure hypercholesterolemia 06/04/2020    PAST SURGICAL HISTORY: Past Surgical History:  Procedure Laterality Date    APPENDECTOMY     back epidural   08/2017   BACK SURGERY     CORONARY ANGIOPLASTY WITH STENT PLACEMENT  08/19/2012   KNEE SURGERY      FAMILY HISTORY: Family History  Problem Relation Age of Onset   Diabetes Mother    Sarcoidosis Mother    Stroke Father    Parkinson's disease Neg Hx     SOCIAL HISTORY: Social History   Socioeconomic History   Marital status: Married    Spouse name: Ronald [name]    Number of children: 3   Years of education: Not on file   Highest education level: Not on file  Occupational History   Occupation: Retired   Tobacco Use   Smoking status: Former   Smokeless tobacco: Never  Building services engineer Use: Never used  Substance and Sexual Activity   Alcohol use: Yes    Comment: occ glass of wine for a special occasion   Drug use: No   Sexual activity: Not on file  Other Topics Concern   Not on file  Social History Narrative   Drinks about 1 cup of coffee a day    Lives with wife [name]   Left handed   Social Determinants of Health   Financial Resource Strain: Not on file  Food Insecurity: Not on file  Transportation Needs: Not on file  Physical Activity: Not on file  Stress: Not on file  Social Connections: Not on file  Intimate Partner Violence: Not on file      PHYSICAL EXAM  Vitals:   03/01/23 1440  BP: 114/66  Pulse: (!) 59  Weight: 175 lb 12.8 oz (79.7 kg)  Height: 5\' 7"  (1.702 m)   Body mass index is 27.53 kg/m.  Generalized: Well developed, in no acute distress   Neurological examination  Mentation: Alert oriented to time, place, history taking. Follows all commands speech and language fluent Cranial nerve II-XII: Pupils were equal round reactive to light. Extraocular movements were full, visual field were full on confrontational test. Facial sensation and strength were normal. Uvula tongue midline. Head turning and shoulder shrug  were normal and symmetric. Motor: The motor testing reveals 5 over 5 strength of all 4  extremities. Good symmetric motor tone is noted throughout.  Moderate impairment of finger taps.  Mild impairment of toe and heel taps. Sensory: Sensory testing is intact to soft touch on all 4 extremities. No evidence of extinction is noted.  Coordination: Cerebellar testing reveals good finger-nose-finger and heel-to-shin bilaterally.  Gait and station: Patient uses a  cane when ambulating.  Gait is slightly unsteady.  Right foot inverts when ambulating tandem gait not attempted Reflexes: Deep tendon reflexes are symmetric and normal bilaterally.   DIAGNOSTIC DATA (LABS, IMAGING, TESTING) - I reviewed patient records, labs, notes, testing and imaging myself where available.      ASSESSMENT AND PLAN 72 y.o. year old male  has a past medical history of CAD in native artery (06/04/2020), Diabetes mellitus type 2 in nonobese ([facility]CC) (06/04/2020), Diabetes mellitus without complication ([facility]), Essential hypertension (06/04/2020), Hypertension, Parkinson's disease, Parkinson's disease (06/04/2020), and Pure hypercholesterolemia (06/04/2020). here with:  Parkinson's disease Recent falls  - Referral to PT for falls  -Continue Sinemet 2 pills at 9 AM, 1 pill at 12 PM, 2 pills at 3 PM, 1 pill at 6 PM and 2 pills at 9 PM -Advised to stay well-hydrated -Follow-up in 6 months or sooner if needed     Butch Penny, MSN, NP-C 03/01/2023, 2:44 PM [facility] [address], [address] [place], [place] [address] [phone]

## 2023-03-06 ENCOUNTER — Ambulatory Visit: Payer: Medicare HMO | Admitting: Orthopaedic Surgery

## 2023-03-06 ENCOUNTER — Other Ambulatory Visit (INDEPENDENT_AMBULATORY_CARE_PROVIDER_SITE_OTHER): Payer: Medicare HMO

## 2023-03-06 ENCOUNTER — Encounter: Payer: Self-pay | Admitting: Orthopaedic Surgery

## 2023-03-06 DIAGNOSIS — M1712 Unilateral primary osteoarthritis, left knee: Secondary | ICD-10-CM

## 2023-03-06 DIAGNOSIS — M25562 Pain in left knee: Secondary | ICD-10-CM

## 2023-03-06 DIAGNOSIS — G8929 Other chronic pain: Secondary | ICD-10-CM

## 2023-03-06 NOTE — Progress Notes (Signed)
The patient is a very pleasant 72 year old gentleman sent to me from my retired partner Dr. 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  This is been well-documented and has been off for many years now.  He does have Parkinson disease and chronic anemia.  He is not on blood thinning medications.  I was able to review all of his notes within epic including medications and past medical history.  He does see doctors regularly.  He has no significant cardiac issues and he is not a diabetic.  He is a thin individual but he does have Parkinson disease.  Even with him walking I am concerned about his gait.  His left knee does have significant varus malalignment.  He is ambulating with an assistive device.  He has had multiple injections in his knee over the years.  On examination of his left knee there is significant varus malalignment that is not really correctable.  He has a mild to moderate effusion with patellofemoral crepitation throughout the arc of motion of his knee.  There is medial and lateral joint line tenderness.  X-rays of the left knee today shows severe end-stage arthritis with osteophytes in all 3 compartments.  There is varus malalignment and bone-on-bone wear of the medial compartment and patellofemoral joint.  At this point I did recommend knee replacement surgery.  He defers any other injections since they are now not working for him.  We talked about the risks and benefits of the surgery and what to expect from an intraoperative and postoperative standpoint.  I showed him a knee replacement model and talked in detail about how the surgery is performed.  All questions and concerns were answered and addressed.  We will work on getting him on the surgical schedule.

## 2023-03-07 ENCOUNTER — Other Ambulatory Visit (HOSPITAL_BASED_OUTPATIENT_CLINIC_OR_DEPARTMENT_OTHER): Payer: Self-pay | Admitting: Cardiovascular Disease

## 2023-03-07 ENCOUNTER — Ambulatory Visit: Payer: Medicare HMO | Attending: Adult Health | Admitting: Physical Therapy

## 2023-03-07 ENCOUNTER — Other Ambulatory Visit: Payer: Self-pay

## 2023-03-07 DIAGNOSIS — R2689 Other abnormalities of gait and mobility: Secondary | ICD-10-CM | POA: Insufficient documentation

## 2023-03-07 DIAGNOSIS — M6281 Muscle weakness (generalized): Secondary | ICD-10-CM | POA: Diagnosis present

## 2023-03-07 DIAGNOSIS — R2681 Unsteadiness on feet: Secondary | ICD-10-CM | POA: Insufficient documentation

## 2023-03-07 DIAGNOSIS — R293 Abnormal posture: Secondary | ICD-10-CM | POA: Insufficient documentation

## 2023-03-07 DIAGNOSIS — R29818 Other symptoms and signs involving the nervous system: Secondary | ICD-10-CM | POA: Diagnosis present

## 2023-03-07 NOTE — Therapy (Signed)
OUTPATIENT PHYSICAL THERAPY NEURO EVALUATION   Patient Name: Ronald [name] MRN: [id number] DOB:[birth year], 72 y.o., male Today's Date: 03/08/2023   PCP: Woodroe Chen, MD REFERRING PROVIDER: Butch Penny, NP   END OF SESSION:  PT End of Session - 03/08/23 0900     Visit Number 1    Number of Visits 12    Date for PT Re-Evaluation 04/14/23    Authorization Type [employer]-auth submitted at initial eval    Progress Note Due on Visit 10    PT Start Time 1537    PT Stop Time 1617    PT Time Calculation (min) 40 min    Equipment Utilized During Treatment Gait belt    Activity Tolerance Patient tolerated treatment well    Behavior During Therapy WFL for tasks assessed/performed             Past Medical History:  Diagnosis Date   CAD in native artery 06/04/2020   RCA infarct 2013.  S/p RCA and OM PCI   Diabetes mellitus type 2 in nonobese (HCC) 06/04/2020   Diabetes mellitus without complication (HCC)    Essential hypertension 06/04/2020   Hypertension    Parkinson's disease    Parkinson's disease 06/04/2020   Pure hypercholesterolemia 06/04/2020   Past Surgical History:  Procedure Laterality Date   APPENDECTOMY     back epidural   08/2017   BACK SURGERY     CORONARY ANGIOPLASTY WITH STENT PLACEMENT  08/19/2012   KNEE SURGERY     Patient Active Problem List   Diagnosis Date Noted   High risk medication use 10/28/2021   Olecranon bursitis, left elbow 04/21/2021   Personal history of COVID-19 04/13/2021   Primary osteoarthritis, 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   Essential hypertension 06/04/2020   Pure hypercholesterolemia 06/04/2020   CAD in native artery 06/04/2020   Parkinson's disease 06/04/2020   Diabetes mellitus type 2 in nonobese (HCC) 06/04/2020   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   Iron deficiency anemia 11/14/2019   Unilateral primary osteoarthritis, left knee 04/01/2019   Chronic left-sided low back pain with left-sided sciatica 04/01/2019    Lumbar radiculopathy 08/21/2017   Chronic pain syndrome 08/04/2017   Facet arthropathy 08/04/2017   Lumbar paraspinal muscle spasm 08/04/2017   Anemia of chronic disease 07/03/2017   Dizziness 11/10/2016   Hyperlipidemia, unspecified 10/27/2016   Other intervertebral disc degeneration, lumbar region 06/30/2016   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 heart with stable angina pectoris (HCC) 02/26/2016   SOB (shortness of breath) 02/26/2016   Old MI (myocardial infarction) 02/26/2016   OAB (overactive bladder) 09/05/2013   Ischemic heart disease 09/21/2012   Bell's palsy 06/14/2012    ONSET DATE: 03/01/2023 (MD referral)  REFERRING DIAG:  G20.B2 (ICD-10-CM) - Parkinson's disease with dyskinesia and fluctuating manifestations  R29.6 (ICD-10-CM) - Frequent falls    THERAPY DIAG:  Other abnormalities of gait and mobility  Unsteadiness on feet  Muscle weakness (generalized)  Other symptoms and signs involving the nervous system  Abnormal posture  Rationale for Evaluation and Treatment: Rehabilitation  SUBJECTIVE:  SUBJECTIVE STATEMENT: Have had at least 6-7 falls-when I stand up and I go backwards.  Hit head and hit shoulder.  Able to get up from the floor holding on and taking time to get up.  Notice that I favor my R side.  Pt does describe some freezing episodes at times. Pt accompanied by: significant other  PERTINENT HISTORY: CAD, DM, HTN, hx of back surgery; sciatica *(Per wife report, pt to have L TKR in 6-8 weeks due to bone on bone arthritis)  PAIN:  Are you having pain? No Standing for any length of time brings on pain in buttocks   PRECAUTIONS: Fall  WEIGHT BEARING RESTRICTIONS: No  FALLS: Has patient fallen in last 6 months? Yes. Number of falls 6  LIVING  ENVIRONMENT: Lives with: lives with their spouse Lives in: House/apartment Stairs: Yes: Internal: 6 steps; on right going up and External: 1 steps; none Has following equipment at home: Single point cane  PLOF: Independent with household mobility with device, Independent with community mobility with device, and Leisure: does yoga and does cycling; does stretches for his back  PATIENT GOALS: To get stronger before upcoming knee surgery  OBJECTIVE:   DIAGNOSTIC FINDINGS: NA for this episode  COGNITION: Overall cognitive status: Within functional limits for tasks assessed and slowed response time for questions, but answers WFL   SENSATION: Light touch: WFL  COORDINATION: Slowed BLE movement patterns   POSTURE: rounded shoulders, flexed trunk , and weight shift right  LOWER EXTREMITY ROM:   A/ROM WFL grossly assessed in sitting  LOWER EXTREMITY MMT:    MMT Right Eval Left Eval  Hip flexion 4 5  Hip extension    Hip abduction    Hip adduction    Hip internal rotation    Hip external rotation    Knee flexion 4 5  Knee extension 4 5  Ankle dorsiflexion 3+ 3+  Ankle plantarflexion 3- 3-  Ankle inversion    Ankle eversion    (Blank rows = not tested)    TRANSFERS: Assistive device utilized: None  Sit to stand: SBA and with BUE support Stand to sit: SBA and with BUE support GAIT: Gait pattern: step through pattern, decreased step length- Right, decreased step length- Left, decreased ankle dorsiflexion- Left, lateral lean- Right, trunk rotated posterior- Right, trunk flexed, and narrow BOS Distance walked: 40 ft Assistive device utilized: Single point cane Level of assistance: SBA and CGA Comments: Unstable, slowed gait pattern  FUNCTIONAL TESTS:  5 times sit to stand: 26.06 with UE support Timed up and go (TUG): NT 10 meter walk test: NT Berg Balance test:  23/56 *En bloc turn noted; with squat to floor, pt has difficulty with LOB getting up and needs assist to  return safety to stand   [facility] PT Assessment - 03/07/23 1600       Standardized Balance Assessment   Standardized Balance Assessment Berg Balance Test      Berg Balance Test   Sit to Stand Able to stand  independently using hands    Standing Unsupported Able to stand 2 minutes with supervision    Sitting with Back Unsupported but Feet Supported on Floor or Stool Able to sit 2 minutes under supervision    Stand to Sit Controls descent by using hands    Transfers Able to transfer safely, definite need of hands    Standing Unsupported with Eyes Closed Able to stand 3 seconds    Standing Unsupported with Feet Together Able to place  feet together independently and stand for 1 minute with supervision    From Standing, Reach Forward with Outstretched Arm Reaches forward but needs supervision    From Standing Position, Pick up Object from Floor Unable to try/needs assist to keep balance    From Standing Position, Turn to Look Behind Over each Shoulder Turn sideways only but maintains balance    Turn 360 Degrees Needs assistance while turning   25 sec   Standing Unsupported, Alternately Place Feet on Step/Stool Needs assistance to keep from falling or unable to try    Standing Unsupported, One Foot in [employer] balance while stepping or standing    Standing on One Leg Unable to try or needs assist to prevent fall    Total Score 23    Berg comment: Scores <45/56 indicate increased fall risk.              TODAY'S TREATMENT:                                                                                                                              DATE: 03/07/2023    PATIENT EDUCATION: Education details: PT eval results, POC Person educated: Patient and Spouse Education method: Explanation Education comprehension: verbalized understanding  HOME EXERCISE PROGRAM: Not yet initiated  GOALS: Goals reviewed with patient? Yes  SHORT TERM GOALS: Target date: 03/31/2023  Pt will be  supervision with HEP for improved strength, transfers, balance, gait. Baseline: Goal status: INITIAL  2.  Pt will improve 5x sit<>stand to less than or equal to 20 sec to demonstrate improved functional strength and transfer efficiency. Baseline: 26.06 sec Goal status: INITIAL  3.  Pt will verbalize understanding of fall prevention in home environment.   Baseline:  6 falls in 6 months  Goal status:  INITIAL   LONG TERM GOALS: Target date: 04/14/2023  Pt will be supervision with HEP for improved strength, transfers, balance, gait. Baseline:  Goal status: INITIAL  2.  Pt will improve 5x sit<>stand to less than or equal to 15 sec to demonstrate improved functional strength and transfer efficiency. Baseline: 26.06 sec Goal status: INITIAL  3.  Pt will improve Berg score to at least 32/56 to decrease fall risk. Baseline: 23/56 Goal status: INITIAL  4.  Gait velocity to be assessed and goal to be set as appropriate. Baseline:  Goal status: INITIAL  5.  Pt will verbalize understanding of local Parkinson's disease community resources.  Baseline:  Goal status: INITIAL  ASSESSMENT:  CLINICAL IMPRESSION: Patient is a 72 y.o. male who was seen today for physical therapy evaluation and treatment for Parkinson's disease due to recent falls.   He reports having at least 6 falls in the past 6 months, where he lost balance posteriorly upon standing.  In eval today, he demo bradykinesia, abnormal posture, decreased functional strength, decreased balance, decreased timing and coordination with gait.  He has difficulty with attempts at SLS and transitional movements  from narrowed to wide BOS; in addition, he requires assistance coming up to stand from squat position due to near LOB.  He currently exercises at his church with yoga and rides stationary bike at home.  He will benefit from skilled PT to address the above stated deficits to decrease fall risk and to improve overall functional  mobility.  OBJECTIVE IMPAIRMENTS: Abnormal gait, decreased balance, decreased mobility, difficulty walking, decreased strength, and postural dysfunction.   ACTIVITY LIMITATIONS: standing, squatting, stairs, transfers, and locomotion level  PARTICIPATION LIMITATIONS: community activity  PERSONAL FACTORS: 3+ comorbidities: see PMH above  are also affecting patient's functional outcome.   REHAB POTENTIAL: Good  CLINICAL DECISION MAKING: Evolving/moderate complexity  EVALUATION COMPLEXITY: Moderate  PLAN:  PT FREQUENCY: 1-2x/week  PT DURATION: 6 weeks  PLANNED INTERVENTIONS: Therapeutic exercises, Therapeutic activity, Neuromuscular re-education, Balance training, Gait training, Patient/Family education, Self Care, Stair training, and DME instructions  PLAN FOR NEXT SESSION: Check gait velocity.  Initiate HEP with seated and standing PWR! Moves, sit<>stand transfers; fall prevention education/tips to reduce freezing episodes.   Gean Maidens., PT 03/08/2023, 9:02 AM  [facility] at [facility] [address] [place], [address] [place], [place] [address] Phone # [phone] Fax # [phone]

## 2023-03-07 NOTE — Telephone Encounter (Signed)
Rx(s) sent to pharmacy electronically.  

## 2023-03-10 ENCOUNTER — Encounter: Payer: Self-pay | Admitting: Physical Therapy

## 2023-03-10 ENCOUNTER — Ambulatory Visit: Payer: Medicare HMO | Admitting: Physical Therapy

## 2023-03-10 DIAGNOSIS — M6281 Muscle weakness (generalized): Secondary | ICD-10-CM

## 2023-03-10 DIAGNOSIS — R2681 Unsteadiness on feet: Secondary | ICD-10-CM

## 2023-03-10 DIAGNOSIS — R293 Abnormal posture: Secondary | ICD-10-CM

## 2023-03-10 DIAGNOSIS — R29818 Other symptoms and signs involving the nervous system: Secondary | ICD-10-CM

## 2023-03-10 DIAGNOSIS — R2689 Other abnormalities of gait and mobility: Secondary | ICD-10-CM | POA: Diagnosis not present

## 2023-03-10 NOTE — Therapy (Signed)
OUTPATIENT PHYSICAL THERAPY NEURO TREATMENT NOTE   Patient Name: Ronald [name] MRN: [id number] DOB:11-Sep-1951, 72 y.o., male Today's Date: 03/10/2023   PCP: Woodroe Chen, MD REFERRING PROVIDER: Butch Penny, NP   END OF SESSION:  PT End of Session - 03/10/23 0855     Visit Number 2    Number of Visits 12    Date for PT Re-Evaluation 04/14/23    Authorization Type [employer]    Authorization Time Period 03/07/2023-04/14/2023    Authorization - Visit Number 2    Authorization - Number of Visits 12    Progress Note Due on Visit 10    PT Start Time 0852    PT Stop Time 0931    PT Time Calculation (min) 39 min    Equipment Utilized During Treatment --    Activity Tolerance Patient tolerated treatment well    Behavior During Therapy [facility] for tasks assessed/performed              Past Medical History:  Diagnosis Date   CAD in native artery 06/04/2020   RCA infarct 2013.  S/p RCA and OM PCI   Diabetes mellitus type 2 in nonobese (HCC) 06/04/2020   Diabetes mellitus without complication (HCC)    Essential hypertension 06/04/2020   Hypertension    Parkinson's disease    Parkinson's disease 06/04/2020   Pure hypercholesterolemia 06/04/2020   Past Surgical History:  Procedure Laterality Date   APPENDECTOMY     back epidural   08/2017   BACK SURGERY     CORONARY ANGIOPLASTY WITH STENT PLACEMENT  08/19/2012   KNEE SURGERY     Patient Active Problem List   Diagnosis Date Noted   High risk medication use 10/28/2021   Olecranon bursitis, left elbow 04/21/2021   Personal history of COVID-19 04/13/2021   Primary osteoarthritis, 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   Essential hypertension 06/04/2020   Pure hypercholesterolemia 06/04/2020   CAD in native artery 06/04/2020   Parkinson's disease 06/04/2020   Diabetes mellitus type 2 in nonobese (HCC) 06/04/2020   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   Iron deficiency anemia 11/14/2019   Unilateral primary osteoarthritis,  left knee 04/01/2019   Chronic left-sided low back pain with left-sided sciatica 04/01/2019   Lumbar radiculopathy 08/21/2017   Chronic pain syndrome 08/04/2017   Facet arthropathy 08/04/2017   Lumbar paraspinal muscle spasm 08/04/2017   Anemia of chronic disease 07/03/2017   Dizziness 11/10/2016   Hyperlipidemia, unspecified 10/27/2016   Other intervertebral disc degeneration, lumbar region 06/30/2016   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 heart with stable angina pectoris (HCC) 02/26/2016   SOB (shortness of breath) 02/26/2016   Old MI (myocardial infarction) 02/26/2016   OAB (overactive bladder) 09/05/2013   Ischemic heart disease 09/21/2012   Bell's palsy 06/14/2012    ONSET DATE: 03/01/2023 (MD referral)  REFERRING DIAG:  G20.B2 (ICD-10-CM) - Parkinson's disease with dyskinesia and fluctuating manifestations  R29.6 (ICD-10-CM) - Frequent falls    THERAPY DIAG:  Other abnormalities of gait and mobility  Unsteadiness on feet  Muscle weakness (generalized)  Abnormal posture  Other symptoms and signs involving the nervous system  Rationale for Evaluation and Treatment: Rehabilitation  SUBJECTIVE:  SUBJECTIVE STATEMENT: Think some of the weakness comes from low back issues Pt accompanied by: significant other  PERTINENT HISTORY: CAD, DM, HTN, hx of back surgery; sciatica *(Per wife report, pt to have L TKR in 6-8 weeks due to bone on bone arthritis)  PAIN:  Are you having pain? Yes: NPRS scale: 3/10 Pain location: low back/buttocks Pain description: achy Aggravating factors: standing too long Relieving factors: sitting Standing for any length of time brings on pain in buttocks   PRECAUTIONS: Fall  WEIGHT BEARING RESTRICTIONS: No  FALLS: Has patient fallen in last  6 months? Yes. Number of falls 6  LIVING ENVIRONMENT: Lives with: lives with their spouse Lives in: House/apartment Stairs: Yes: Internal: 6 steps; on right going up and External: 1 steps; none Has following equipment at home: Single point cane  PLOF: Independent with household mobility with device, Independent with community mobility with device, and Leisure: does yoga and does cycling; does stretches for his back  PATIENT GOALS: To get stronger before upcoming knee surgery  OBJECTIVE:    Today's treatment:  03/10/2023  Neuro Re-education:  Gait velocity:  19.81 sec= 1.66 ft/sec Pt performs PWR! Moves in seated position x 10 reps   PWR! Up for improved posture  PWR! Rock for improved weighshifting  PWR! Twist for improved trunk rotation x 1 rep, with some imbalance, pain in low back; modified to active trunk rotation looking over shoulder   PWR! Step for improved step initiation   Cues provided for technique, intensity, rationale for PWR! Moves in relation to function  Sit to stand, 2 x 5 reps  -cues for forward lean, upright posture upon standing Seated ankle pumps, 2 x 10 reps Standing lateral weightshift x 10 reps Standing stagger stance forward/back weightshift x 10 reps Lateral step and weightshift x 5 reps each side  PATIENT EDUCATION: Education details: HEP initiated-PWR! Moves sitting Person educated: Patient Education method: Explanation, Demonstration, Verbal cues, and Handouts Education comprehension: verbalized understanding, returned demonstration, verbal cues required, and needs further education   ---------------------------------------------------- Objective information below taken at initial evaluation:   DIAGNOSTIC FINDINGS: NA for this episode  COGNITION: Overall cognitive status: Within functional limits for tasks assessed and slowed response time for questions, but answers WFL   SENSATION: Light touch: WFL  COORDINATION: Slowed BLE  movement patterns   POSTURE: rounded shoulders, flexed trunk , and weight shift right  LOWER EXTREMITY ROM:   A/ROM WFL grossly assessed in sitting  LOWER EXTREMITY MMT:    MMT Right Eval Left Eval  Hip flexion 4 5  Hip extension    Hip abduction    Hip adduction    Hip internal rotation    Hip external rotation    Knee flexion 4 5  Knee extension 4 5  Ankle dorsiflexion 3+ 3+  Ankle plantarflexion 3- 3-  Ankle inversion    Ankle eversion    (Blank rows = not tested)    TRANSFERS: Assistive device utilized: None  Sit to stand: SBA and with BUE support Stand to sit: SBA and with BUE support GAIT: Gait pattern: step through pattern, decreased step length- Right, decreased step length- Left, decreased ankle dorsiflexion- Left, lateral lean- Right, trunk rotated posterior- Right, trunk flexed, and narrow BOS Distance walked: 40 ft Assistive device utilized: Single point cane Level of assistance: SBA and CGA Comments: Unstable, slowed gait pattern  FUNCTIONAL TESTS:  5 times sit to stand: 26.06 with UE support Timed up and go (TUG): NT 10 meter walk  test: NT Berg Balance test:  23/56 *En bloc turn noted; with squat to floor, pt has difficulty with LOB getting up and needs assist to return safety to stand      TODAY'S TREATMENT:                                                                                                                              DATE: 03/07/2023    PATIENT EDUCATION: Education details: PT eval results, POC Person educated: Patient and Spouse Education method: Explanation Education comprehension: verbalized understanding  HOME EXERCISE PROGRAM: Not yet initiated  GOALS: Goals reviewed with patient? Yes  SHORT TERM GOALS: Target date: 03/31/2023  Pt will be supervision with HEP for improved strength, transfers, balance, gait. Baseline: Goal status: INITIAL  2.  Pt will improve 5x sit<>stand to less than or equal to 20 sec to  demonstrate improved functional strength and transfer efficiency. Baseline: 26.06 sec Goal status: IN PROGRESS  3.  Pt will verbalize understanding of fall prevention in home environment.   Baseline:  6 falls in 6 months  Goal status:  IN PROGRESS   LONG TERM GOALS: Target date: 04/14/2023  Pt will be supervision with HEP for improved strength, transfers, balance, gait. Baseline:  Goal status: IN PROGRESS  2.  Pt will improve 5x sit<>stand to less than or equal to 15 sec to demonstrate improved functional strength and transfer efficiency. Baseline: 26.06 sec Goal status: IN PROGRESS  3.  Pt will improve Berg score to at least 32/56 to decrease fall risk. Baseline: 23/56 Goal status: IN PROGRESS  4.  Gait velocity to be assessed and goal to be set as appropriate. (Imrpove to at least 2 ft/sec for improved safety with gait) Baseline: 1.66 ft/sec 03/10/2023 Goal status: IN PROGRESS  5.  Pt will verbalize understanding of local Parkinson's disease community resources.  Baseline:  Goal status: IN PROGRESS  ASSESSMENT:  CLINICAL IMPRESSION: Initiated PWR! Moves in sitting today, with pt demonstrating good response to large amplitude, whole body movements.  He does report "feeling" it in his low back, some increased difficulty with PWR! Twist, so modified to A/ROM seated trunk rotation.  Worked also on sit<>Stand transfers, with good forward initiation and improved upright posture and balance upon standing with repetition.  He does have foot slap bilaterally with gait, R>L, so some focus today was on ankle exercise in sitting and standing.  He will continue to benefit from skilled PT towards goals for improved functional mobility and decreased fall risk.    OBJECTIVE IMPAIRMENTS: Abnormal gait, decreased balance, decreased mobility, difficulty walking, decreased strength, and postural dysfunction.   ACTIVITY LIMITATIONS: standing, squatting, stairs, transfers, and locomotion  level  PARTICIPATION LIMITATIONS: community activity  PERSONAL FACTORS: 3+ comorbidities: see PMH above  are also affecting patient's functional outcome.   REHAB POTENTIAL: Good  CLINICAL DECISION MAKING: Evolving/moderate complexity  EVALUATION COMPLEXITY: Moderate  PLAN:  PT FREQUENCY: 1-2x/week  PT DURATION: 6  weeks  PLANNED INTERVENTIONS: Therapeutic exercises, Therapeutic activity, Neuromuscular re-education, Balance training, Gait training, Patient/Family education, Self Care, Stair training, and DME instructions  PLAN FOR NEXT SESSION: Review HEP with seated PWR! Moves and continue work on standing PWR! Moves, sit<>stand transfers; fall prevention education/tips to reduce freezing episodes.   Gean Maidens., PT 03/10/2023, 9:30 AM  [facility] at [facility] [address] [place], [address] [place], [place] [address] Phone # [phone] Fax # [phone]

## 2023-03-10 NOTE — Therapy (Signed)
OUTPATIENT PHYSICAL THERAPY NEURO TREATMENT NOTE   Patient Name: Ronald [name] MRN: [id number] DOB:1951-03-07, 72 y.o., male Today's Date: 03/13/2023   PCP: Woodroe Chen, MD REFERRING PROVIDER: Butch Penny, NP   END OF SESSION:  PT End of Session - 03/13/23 1528     Visit Number 3    Number of Visits 12    Date for PT Re-Evaluation 04/14/23    Authorization Type [employer]    Authorization Time Period 03/07/2023-04/14/2023    Authorization - Visit Number 3    Authorization - Number of Visits 12    Progress Note Due on Visit 10    PT Start Time 1448    PT Stop Time 1528    PT Time Calculation (min) 40 min    Equipment Utilized During Treatment Gait belt    Activity Tolerance Patient tolerated treatment well;Patient limited by pain    Behavior During Therapy WFL for tasks assessed/performed               Past Medical History:  Diagnosis Date   CAD in native artery 06/04/2020   RCA infarct 2013.  S/p RCA and OM PCI   Diabetes mellitus type 2 in nonobese (HCC) 06/04/2020   Diabetes mellitus without complication (HCC)    Essential hypertension 06/04/2020   Hypertension    Parkinson's disease    Parkinson's disease 06/04/2020   Pure hypercholesterolemia 06/04/2020   Past Surgical History:  Procedure Laterality Date   APPENDECTOMY     back epidural   08/2017   BACK SURGERY     CORONARY ANGIOPLASTY WITH STENT PLACEMENT  08/19/2012   KNEE SURGERY     Patient Active Problem List   Diagnosis Date Noted   High risk medication use 10/28/2021   Olecranon bursitis, left elbow 04/21/2021   Personal history of COVID-19 04/13/2021   Primary osteoarthritis, 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   Essential hypertension 06/04/2020   Pure hypercholesterolemia 06/04/2020   CAD in native artery 06/04/2020   Parkinson's disease 06/04/2020   Diabetes mellitus type 2 in nonobese (HCC) 06/04/2020   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   Iron deficiency anemia 11/14/2019    Unilateral primary osteoarthritis, left knee 04/01/2019   Chronic left-sided low back pain with left-sided sciatica 04/01/2019   Lumbar radiculopathy 08/21/2017   Chronic pain syndrome 08/04/2017   Facet arthropathy 08/04/2017   Lumbar paraspinal muscle spasm 08/04/2017   Anemia of chronic disease 07/03/2017   Dizziness 11/10/2016   Hyperlipidemia, unspecified 10/27/2016   Other intervertebral disc degeneration, lumbar region 06/30/2016   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 heart with stable angina pectoris (HCC) 02/26/2016   SOB (shortness of breath) 02/26/2016   Old MI (myocardial infarction) 02/26/2016   OAB (overactive bladder) 09/05/2013   Ischemic heart disease 09/21/2012   Bell's palsy 06/14/2012    ONSET DATE: 03/01/2023 (MD referral)  REFERRING DIAG:  G20.B2 (ICD-10-CM) - Parkinson's disease with dyskinesia and fluctuating manifestations  R29.6 (ICD-10-CM) - Frequent falls    THERAPY DIAG:  Other abnormalities of gait and mobility  Unsteadiness on feet  Muscle weakness (generalized)  Abnormal posture  Other symptoms and signs involving the nervous system  Rationale for Evaluation and Treatment: Rehabilitation  SUBJECTIVE:  SUBJECTIVE STATEMENT: Brought his exercises and a copy of his MRI- has been doing his exercises.    Pt accompanied by: significant other  PERTINENT HISTORY: CAD, DM, HTN, hx of back surgery; sciatica *(Per wife report, pt to have L TKR in 6-8 weeks due to bone on bone arthritis)  PAIN:  Are you having pain? Yes: NPRS scale: 3/10 Pain location: low back/buttocks Pain description: achy Aggravating factors: standing too long Relieving factors: sitting Standing for any length of time brings on pain in buttocks   PRECAUTIONS: Fall  WEIGHT  BEARING RESTRICTIONS: No  FALLS: Has patient fallen in last 6 months? Yes. Number of falls 6  LIVING ENVIRONMENT: Lives with: lives with their spouse Lives in: House/apartment Stairs: Yes: Internal: 6 steps; on right going up and External: 1 steps; none Has following equipment at home: Single point cane  PLOF: Independent with household mobility with device, Independent with community mobility with device, and Leisure: does yoga and does cycling; does stretches for his back  PATIENT GOALS: To get stronger before upcoming knee surgery  OBJECTIVE:      TODAY'S TREATMENT: 03/13/23  Lumbar MRI 05/03/19: grade I anterolisthesis of L2 and L3, lumbar spondylosis worst at L2-3 with disc extrusion impinging L1. 5mm posterior R facet cyst causing compression of thecal sac, severe B neural foraminal narrowing and central canal stenosis.    Activity Comments  seated PWR! Moves performed with cuing for large outstretched palms and looking at hands  PWR! Up 10x   PWR! Rock 10x   PWR! Step 10x  Great form and tolerance  standing PWR! Moves performed with II bar support  PWR! Up  PWR! [employer]! Step  Great form with PWR! Up; limited ability to stand tall and keep balance with rock, required B UE support on bars. Required sitting rest break d/t back pain and L knee pain.   STS 6x  Cues to tuck feet under to allow for improved ease; pushing off knees              PATIENT EDUCATION: Education details: edu on and handouts on fall prevention edu and tips to reduce freezing  Person educated: Patient Education method: Explanation, Demonstration, Tactile cues, Verbal cues, and Handouts Education comprehension: verbalized understanding and returned demonstration    ---------------------------------------------------- Objective information below taken at initial evaluation:   DIAGNOSTIC FINDINGS: NA for this episode  COGNITION: Overall cognitive status: Within functional limits for  tasks assessed and slowed response time for questions, but answers WFL   SENSATION: Light touch: WFL  COORDINATION: Slowed BLE movement patterns   POSTURE: rounded shoulders, flexed trunk , and weight shift right  LOWER EXTREMITY ROM:   A/ROM WFL grossly assessed in sitting  LOWER EXTREMITY MMT:    MMT Right Eval Left Eval  Hip flexion 4 5  Hip extension    Hip abduction    Hip adduction    Hip internal rotation    Hip external rotation    Knee flexion 4 5  Knee extension 4 5  Ankle dorsiflexion 3+ 3+  Ankle plantarflexion 3- 3-  Ankle inversion    Ankle eversion    (Blank rows = not tested)    TRANSFERS: Assistive device utilized: None  Sit to stand: SBA and with BUE support Stand to sit: SBA and with BUE support GAIT: Gait pattern: step through pattern, decreased step length- Right, decreased step length- Left, decreased ankle dorsiflexion- Left, lateral lean- Right, trunk rotated posterior- Right, trunk flexed,  and narrow BOS Distance walked: 40 ft Assistive device utilized: Single point cane Level of assistance: SBA and CGA Comments: Unstable, slowed gait pattern  FUNCTIONAL TESTS:  5 times sit to stand: 26.06 with UE support Timed up and go (TUG): NT 10 meter walk test: NT Berg Balance test:  23/56 *En bloc turn noted; with squat to floor, pt has difficulty with LOB getting up and needs assist to return safety to stand      TODAY'S TREATMENT:                                                                                                                              DATE: 03/07/2023    PATIENT EDUCATION: Education details: PT eval results, POC Person educated: Patient and Spouse Education method: Explanation Education comprehension: verbalized understanding  HOME EXERCISE PROGRAM: Not yet initiated  GOALS: Goals reviewed with patient? Yes  SHORT TERM GOALS: Target date: 03/31/2023  Pt will be supervision with HEP for improved strength,  transfers, balance, gait. Baseline: Goal status: INITIAL  2.  Pt will improve 5x sit<>stand to less than or equal to 20 sec to demonstrate improved functional strength and transfer efficiency. Baseline: 26.06 sec Goal status: IN PROGRESS  3.  Pt will verbalize understanding of fall prevention in home environment.   Baseline:  6 falls in 6 months  Goal status:  MET 03/13/23   LONG TERM GOALS: Target date: 04/14/2023  Pt will be supervision with HEP for improved strength, transfers, balance, gait. Baseline:  Goal status: IN PROGRESS  2.  Pt will improve 5x sit<>stand to less than or equal to 15 sec to demonstrate improved functional strength and transfer efficiency. Baseline: 26.06 sec Goal status: IN PROGRESS  3.  Pt will improve Berg score to at least 32/56 to decrease fall risk. Baseline: 23/56 Goal status: IN PROGRESS  4.  Gait velocity to be assessed and goal to be set as appropriate. (Imrpove to at least 2 ft/sec for improved safety with gait) Baseline: 1.66 ft/sec 03/10/2023 Goal status: IN PROGRESS  5.  Pt will verbalize understanding of local Parkinson's disease community resources.  Baseline:  Goal status: IN PROGRESS  ASSESSMENT:  CLINICAL IMPRESSION: Patient arrived to session with copy of lumbar MRI from 2020- see above for results. Reviewed sitting PWR! Moves which required only minor corrections to form. Standing PWR! Moves revealed difficulty maintaining upright posture and stability. Patient also with more limited positional tolerance in standing d/t LBP. Provided pt edu on avoiding freezing and falls. Patient reported understanding and without complaints upon leaving.   OBJECTIVE IMPAIRMENTS: Abnormal gait, decreased balance, decreased mobility, difficulty walking, decreased strength, and postural dysfunction.   ACTIVITY LIMITATIONS: standing, squatting, stairs, transfers, and locomotion level  PARTICIPATION LIMITATIONS: community activity  PERSONAL FACTORS:  3+ comorbidities: see PMH above  are also affecting patient's functional outcome.   REHAB POTENTIAL: Good  CLINICAL DECISION MAKING: Evolving/moderate complexity  EVALUATION COMPLEXITY: Moderate  PLAN:  PT FREQUENCY: 1-2x/week  PT DURATION: 6 weeks  PLANNED INTERVENTIONS: Therapeutic exercises, Therapeutic activity, Neuromuscular re-education, Balance training, Gait training, Patient/Family education, Self Care, Stair training, and DME instructions  PLAN FOR NEXT SESSION: continue work on standing PWR! Moves, sit<>stand transfers   Anette Guarneri, [place], DPT 03/13/23 3:31 PM  [facility] at [facility] [address] [place], [address] [place], [place] [address] Phone # [phone] Fax # [phone]

## 2023-03-13 ENCOUNTER — Ambulatory Visit: Payer: Medicare HMO | Admitting: Physical Therapy

## 2023-03-13 ENCOUNTER — Encounter: Payer: Self-pay | Admitting: Physical Therapy

## 2023-03-13 DIAGNOSIS — R2689 Other abnormalities of gait and mobility: Secondary | ICD-10-CM

## 2023-03-13 DIAGNOSIS — R29818 Other symptoms and signs involving the nervous system: Secondary | ICD-10-CM

## 2023-03-13 DIAGNOSIS — R293 Abnormal posture: Secondary | ICD-10-CM

## 2023-03-13 DIAGNOSIS — R2681 Unsteadiness on feet: Secondary | ICD-10-CM

## 2023-03-13 DIAGNOSIS — M6281 Muscle weakness (generalized): Secondary | ICD-10-CM

## 2023-03-13 NOTE — Patient Instructions (Signed)
Fall Prevention and Home Safety Falls cause injuries and can affect all age groups. It is possible to use preventive measures to significantly decrease the likelihood of falls. There are many simple measures which can make your home safer and prevent falls. OUTDOORS Repair cracks and edges of walkways and driveways. Remove high doorway thresholds. Trim shrubbery on the main path into your home. Have good outside lighting. Clear walkways of tools, rocks, debris, and clutter. Check that handrails are not broken and are securely fastened. Both sides of steps should have handrails. Have leaves, snow, and ice cleared regularly. Use sand or salt on walkways during winter months. In the garage, clean up grease or oil spills. BATHROOM Install night lights. Install grab bars by the toilet and in the tub and shower. Use non-skid mats or decals in the tub or shower. Place a plastic non-slip stool in the shower to sit on, if needed. Keep floors dry and clean up all water on the floor immediately. Remove soap buildup in the tub or shower on a regular basis. Secure bath mats with non-slip, double-sided rug tape. Remove throw rugs and tripping hazards from the floors. BEDROOMS Install night lights. Make sure a bedside light is easy to reach. Do not use oversized bedding. Keep a telephone by your bedside. Have a firm chair with side arms to use for getting dressed. Remove throw rugs and tripping hazards from the floor. KITCHEN Keep handles on pots and pans turned toward the center of the stove. Use back burners when possible. Clean up spills quickly and allow time for drying. Avoid walking on wet floors. Avoid hot utensils and knives. Position shelves so they are not too high or low. Place commonly used objects within easy reach. If necessary, use a sturdy step stool with a grab bar when reaching. Keep electrical cables out of the way. Do not use floor polish or wax that makes floors slippery.  If you must use wax, use non-skid floor wax. Remove throw rugs and tripping hazards from the floor. STAIRWAYS Never leave objects on stairs. Place handrails on both sides of stairways and use them. Fix any loose handrails. Make sure handrails on both sides of the stairways are as long as the stairs. Check carpeting to make sure it is firmly attached along stairs. Make repairs to worn or loose carpet promptly. Avoid placing throw rugs at the top or bottom of stairways, or properly secure the rug with carpet tape to prevent slippage. Get rid of throw rugs, if possible. Have an electrician put in a light switch at the top and bottom of the stairs. OTHER FALL PREVENTION TIPS Wear low-heel or rubber-soled shoes that are supportive and fit well. Wear closed toe shoes. When using a stepladder, make sure it is fully opened and both spreaders are firmly locked. Do not climb a closed stepladder. Add color or contrast paint or tape to grab bars and handrails in your home. Place contrasting color strips on first and last steps. Learn and use mobility aids as needed. Install an electrical emergency response system. Turn on lights to avoid dark areas. Replace light bulbs that burn out immediately. Get light switches that glow. Arrange furniture to create clear pathways. Keep furniture in the same place. Firmly attach carpet with non-skid or double-sided tape. Eliminate uneven floor surfaces. Select a carpet pattern that does not visually hide the edge of steps. Be aware of all pets. OTHER HOME SAFETY TIPS Set the water temperature for 120 F (48.8 C). Keep  emergency numbers on or near the telephone. Keep smoke detectors on every level of the home and near sleeping areas. Document Released: 08/26/2002 Document Revised: 03/06/2012 Document Reviewed: 11/25/2011 [facility] Patient Information 2015 [place], [place]. This information is not intended to replace advice given to you by your health care provider. Make  sure you discuss any questions you have with your health care provider.    Tips to reduce freezing episodes with standing or walking:  Stand tall with your feet wide, so that you can rock and weight shift through your hips. Don't try to fight the freeze: if you begin taking slower, faster, smaller steps, STOP, get your posture tall, and RESET your posture and balance.  Take a deep breath before taking the BIG step to start again. March in place, with high knee stepping, to get started walking again. Use auditory cues:  Count out loud, think of a familiar tune or song or cadence, use pocket metronome, to use rhythm to get started walking again. Use visual cues:  Use a line to step over, use laser pointer line to step over, (using BIG steps) to start walking again. Use visual targets to keep your posture tall (look ahead and focus on an object or target at eye level). As you approach where your destination with walking, count your steps out loud and/or focus on your target with your eyes until you are fully there. Use appropriate assistive device, as advised by your physical therapist to assist with taking longer, consistent steps.

## 2023-03-15 NOTE — Therapy (Signed)
OUTPATIENT PHYSICAL THERAPY NEURO TREATMENT NOTE   Patient Name: Ronald [name] MRN: [id number] DOB:1950/09/24, 72 y.o., male Today's Date: 03/17/2023   PCP: Woodroe Chen, MD REFERRING PROVIDER: Butch Penny, NP   END OF SESSION:  PT End of Session - 03/17/23 1143     Visit Number 4    Number of Visits 12    Date for PT Re-Evaluation 04/14/23    Authorization Type [employer]    Authorization Time Period 03/07/2023-04/14/2023    Authorization - Visit Number 4    Authorization - Number of Visits 12    Progress Note Due on Visit 10    PT Start Time 1104    PT Stop Time 1145    PT Time Calculation (min) 41 min    Equipment Utilized During Treatment --    Activity Tolerance Patient tolerated treatment well;Patient limited by pain    Behavior During Therapy [facility] for tasks assessed/performed                Past Medical History:  Diagnosis Date   CAD in native artery 06/04/2020   RCA infarct 2013.  S/p RCA and OM PCI   Diabetes mellitus type 2 in nonobese (HCC) 06/04/2020   Diabetes mellitus without complication (HCC)    Essential hypertension 06/04/2020   Hypertension    Parkinson's disease    Parkinson's disease 06/04/2020   Pure hypercholesterolemia 06/04/2020   Past Surgical History:  Procedure Laterality Date   APPENDECTOMY     back epidural   08/2017   BACK SURGERY     CORONARY ANGIOPLASTY WITH STENT PLACEMENT  08/19/2012   KNEE SURGERY     Patient Active Problem List   Diagnosis Date Noted   High risk medication use 10/28/2021   Olecranon bursitis, left elbow 04/21/2021   Personal history of COVID-19 04/13/2021   Primary osteoarthritis, 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   Essential hypertension 06/04/2020   Pure hypercholesterolemia 06/04/2020   CAD in native artery 06/04/2020   Parkinson's disease 06/04/2020   Diabetes mellitus type 2 in nonobese (HCC) 06/04/2020   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   Iron deficiency anemia 11/14/2019   Unilateral  primary osteoarthritis, left knee 04/01/2019   Chronic left-sided low back pain with left-sided sciatica 04/01/2019   Lumbar radiculopathy 08/21/2017   Chronic pain syndrome 08/04/2017   Facet arthropathy 08/04/2017   Lumbar paraspinal muscle spasm 08/04/2017   Anemia of chronic disease 07/03/2017   Dizziness 11/10/2016   Hyperlipidemia, unspecified 10/27/2016   Other intervertebral disc degeneration, lumbar region 06/30/2016   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 heart with stable angina pectoris (HCC) 02/26/2016   SOB (shortness of breath) 02/26/2016   Old MI (myocardial infarction) 02/26/2016   OAB (overactive bladder) 09/05/2013   Ischemic heart disease 09/21/2012   Bell's palsy 06/14/2012    ONSET DATE: 03/01/2023 (MD referral)  REFERRING DIAG:  G20.B2 (ICD-10-CM) - Parkinson's disease with dyskinesia and fluctuating manifestations  R29.6 (ICD-10-CM) - Frequent falls    THERAPY DIAG:  Other abnormalities of gait and mobility  Unsteadiness on feet  Muscle weakness (generalized)  Abnormal posture  Other symptoms and signs involving the nervous system  Rationale for Evaluation and Treatment: Rehabilitation  SUBJECTIVE:  SUBJECTIVE STATEMENT: "I'm up and at [facility]. Feel pretty good." Reports he was fatigued after last session and had some LBP, however requests to continue working on standing activities.     Pt accompanied by: significant other  PERTINENT HISTORY: CAD, DM, HTN, hx of back surgery; sciatica *(Per wife report, pt to have L TKR in 6-8 weeks due to bone on bone arthritis)  PAIN:  Are you having pain? Yes: NPRS scale: 0/10 Pain location: low back/buttocks Pain description: achy Aggravating factors: standing too long Relieving factors: sitting Standing for  any length of time brings on pain in buttocks   PRECAUTIONS: Fall  WEIGHT BEARING RESTRICTIONS: No  FALLS: Has patient fallen in last 6 months? Yes. Number of falls 6  LIVING ENVIRONMENT: Lives with: lives with their spouse Lives in: House/apartment Stairs: Yes: Internal: 6 steps; on right going up and External: 1 steps; none Has following equipment at home: Single point cane  PLOF: Independent with household mobility with device, Independent with community mobility with device, and Leisure: does yoga and does cycling; does stretches for his back  PATIENT GOALS: To get stronger before upcoming knee surgery  OBJECTIVE:      TODAY'S TREATMENT: 03/17/23 Activity Comments  STS + 10# 10x Cues to push hips anteriorly to stand fully upright; very mild posterior wt shift  standing green TB row 2x10 Cueing to shift wt anteriorly onto toes d/t retropulsion; cues for posture ; improved after cueing; required sitting rest break d/t LBP  standing green TB shoulder extension  2x10 Improved posture after cueing; 1 posterior LOB with self-correction   wall bumps hip strategy EO/EC Cueing to encourage anterior wt shift into toes- good ability and control   romberg EO/EC 30" Romberg head turns 30" C/o some dizziness with head turns; resolved with sitting rest break     HOME EXERCISE PROGRAM Last updated: 03/17/23 Access Code: [email or web address] URL: [email or web address] Date: 03/17/2023 Prepared by: [facility] - Outpatient  Rehab - [facility]  Exercises - Standing Anterior Posterior Weight Shift (wall bumps)  - 1 x daily - 5 x weekly - 2 sets - 10 reps   PATIENT EDUCATION: Education details: provided cues on exercises during rest breaks; HEP update Person educated: Patient Education method: Explanation, Demonstration, Tactile cues, Verbal cues, and Handouts Education comprehension: verbalized understanding and returned  demonstration   ---------------------------------------------------- Objective information below taken at initial evaluation:   DIAGNOSTIC FINDINGS: NA for this episode  COGNITION: Overall cognitive status: Within functional limits for tasks assessed and slowed response time for questions, but answers WFL   SENSATION: Light touch: WFL  COORDINATION: Slowed BLE movement patterns   POSTURE: rounded shoulders, flexed trunk , and weight shift right  LOWER EXTREMITY ROM:   A/ROM WFL grossly assessed in sitting  LOWER EXTREMITY MMT:    MMT Right Eval Left Eval  Hip flexion 4 5  Hip extension    Hip abduction    Hip adduction    Hip internal rotation    Hip external rotation    Knee flexion 4 5  Knee extension 4 5  Ankle dorsiflexion 3+ 3+  Ankle plantarflexion 3- 3-  Ankle inversion    Ankle eversion    (Blank rows = not tested)    TRANSFERS: Assistive device utilized: None  Sit to stand: SBA and with BUE support Stand to sit: SBA and with BUE support GAIT: Gait pattern: step through pattern, decreased step length- Right, decreased step length- Left, decreased ankle dorsiflexion- Left, lateral lean-  Right, trunk rotated posterior- Right, trunk flexed, and narrow BOS Distance walked: 40 ft Assistive device utilized: Single point cane Level of assistance: SBA and CGA Comments: Unstable, slowed gait pattern  FUNCTIONAL TESTS:  5 times sit to stand: 26.06 with UE support Timed up and go (TUG): NT 10 meter walk test: NT Berg Balance test:  23/56 *En bloc turn noted; with squat to floor, pt has difficulty with LOB getting up and needs assist to return safety to stand      TODAY'S TREATMENT:                                                                                                                              DATE: 03/07/2023    PATIENT EDUCATION: Education details: PT eval results, POC Person educated: Patient and Spouse Education method:  Explanation Education comprehension: verbalized understanding  HOME EXERCISE PROGRAM: Not yet initiated  GOALS: Goals reviewed with patient? Yes  SHORT TERM GOALS: Target date: 03/31/2023  Pt will be supervision with HEP for improved strength, transfers, balance, gait. Baseline: Goal status: INITIAL  2.  Pt will improve 5x sit<>stand to less than or equal to 20 sec to demonstrate improved functional strength and transfer efficiency. Baseline: 26.06 sec Goal status: IN PROGRESS  3.  Pt will verbalize understanding of fall prevention in home environment.   Baseline:  6 falls in 6 months  Goal status:  MET 03/13/23   LONG TERM GOALS: Target date: 04/14/2023  Pt will be supervision with HEP for improved strength, transfers, balance, gait. Baseline:  Goal status: IN PROGRESS  2.  Pt will improve 5x sit<>stand to less than or equal to 15 sec to demonstrate improved functional strength and transfer efficiency. Baseline: 26.06 sec Goal status: IN PROGRESS  3.  Pt will improve Berg score to at least 32/56 to decrease fall risk. Baseline: 23/56 Goal status: IN PROGRESS  4.  Gait velocity to be assessed and goal to be set as appropriate. (Imrpove to at least 2 ft/sec for improved safety with gait) Baseline: 1.66 ft/sec 03/10/2023 Goal status: IN PROGRESS  5.  Pt will verbalize understanding of local Parkinson's disease community resources.  Baseline:  Goal status: IN PROGRESS  ASSESSMENT:  CLINICAL IMPRESSION: Patient arrived to session with report of some fatigue and LBP after last session, but requesting to continue working on standing activities. Performed resisted postural strengthening activities with cueing to promote more upright standing posture and correction of retropulsion. Patient has 1 posterior LOB with strengthening exercises with self-correction. Pt required sitting rest breaks in between exercises d/t LBP. Reported understanding of HEP update. Pt noted some  dizziness after vestibular balance challenge which resolved in sitting. No further complaints a end of session.   OBJECTIVE IMPAIRMENTS: Abnormal gait, decreased balance, decreased mobility, difficulty walking, decreased strength, and postural dysfunction.   ACTIVITY LIMITATIONS: standing, squatting, stairs, transfers, and locomotion level  PARTICIPATION LIMITATIONS: community activity  PERSONAL FACTORS: 3+ comorbidities: see  PMH above  are also affecting patient's functional outcome.   REHAB POTENTIAL: Good  CLINICAL DECISION MAKING: Evolving/moderate complexity  EVALUATION COMPLEXITY: Moderate  PLAN:  PT FREQUENCY: 1-2x/week  PT DURATION: 6 weeks  PLANNED INTERVENTIONS: Therapeutic exercises, Therapeutic activity, Neuromuscular re-education, Balance training, Gait training, Patient/Family education, Self Care, Stair training, and DME instructions  PLAN FOR NEXT SESSION: continue work on standing PWR! Moves, sit<>stand transfers   Anette Guarneri, Oliver, DPT 03/17/23 11:47 AM  [facility] at [facility] [address] [place], [address] [place], [place] [address] Phone # [phone] Fax # [phone]

## 2023-03-16 ENCOUNTER — Telehealth: Payer: Self-pay

## 2023-03-16 NOTE — Telephone Encounter (Signed)
I called patient to discuss scheduling left TKA.  Left voice mail for him to return my call.

## 2023-03-17 ENCOUNTER — Ambulatory Visit: Payer: Medicare HMO | Admitting: Physical Therapy

## 2023-03-17 ENCOUNTER — Encounter: Payer: Self-pay | Admitting: Physical Therapy

## 2023-03-17 DIAGNOSIS — R2689 Other abnormalities of gait and mobility: Secondary | ICD-10-CM | POA: Diagnosis not present

## 2023-03-17 DIAGNOSIS — R2681 Unsteadiness on feet: Secondary | ICD-10-CM

## 2023-03-17 DIAGNOSIS — R29818 Other symptoms and signs involving the nervous system: Secondary | ICD-10-CM

## 2023-03-17 DIAGNOSIS — M6281 Muscle weakness (generalized): Secondary | ICD-10-CM

## 2023-03-17 DIAGNOSIS — R293 Abnormal posture: Secondary | ICD-10-CM

## 2023-03-20 ENCOUNTER — Encounter: Payer: Self-pay | Admitting: Physical Therapy

## 2023-03-20 ENCOUNTER — Ambulatory Visit: Payer: Medicare HMO | Attending: Adult Health | Admitting: Physical Therapy

## 2023-03-20 DIAGNOSIS — M6281 Muscle weakness (generalized): Secondary | ICD-10-CM | POA: Insufficient documentation

## 2023-03-20 DIAGNOSIS — R2681 Unsteadiness on feet: Secondary | ICD-10-CM | POA: Insufficient documentation

## 2023-03-20 DIAGNOSIS — R2689 Other abnormalities of gait and mobility: Secondary | ICD-10-CM | POA: Diagnosis present

## 2023-03-20 DIAGNOSIS — R293 Abnormal posture: Secondary | ICD-10-CM | POA: Diagnosis present

## 2023-03-20 DIAGNOSIS — R29818 Other symptoms and signs involving the nervous system: Secondary | ICD-10-CM | POA: Insufficient documentation

## 2023-03-20 NOTE — Therapy (Signed)
OUTPATIENT PHYSICAL THERAPY NEURO TREATMENT NOTE   Patient Name: Ronald [name] MRN: [id number] DOB:1951/06/08, 72 y.o., male Today's Date: 03/20/2023   PCP: Woodroe Chen, MD REFERRING PROVIDER: Butch Penny, NP   END OF SESSION:  PT End of Session - 03/20/23 1230     Visit Number 5    Number of Visits 12    Date for PT Re-Evaluation 04/14/23    Authorization Type [employer]    Authorization Time Period 03/07/2023-04/14/2023    Authorization - Visit Number 5    Authorization - Number of Visits 12    Progress Note Due on Visit 10    PT Start Time 1232    PT Stop Time 1314    PT Time Calculation (min) 42 min    Equipment Utilized During Treatment Gait belt    Activity Tolerance Patient tolerated treatment well    Behavior During Therapy WFL for tasks assessed/performed                Past Medical History:  Diagnosis Date   CAD in native artery 06/04/2020   RCA infarct 2013.  S/p RCA and OM PCI   Diabetes mellitus type 2 in nonobese (HCC) 06/04/2020   Diabetes mellitus without complication (HCC)    Essential hypertension 06/04/2020   Hypertension    Parkinson's disease    Parkinson's disease 06/04/2020   Pure hypercholesterolemia 06/04/2020   Past Surgical History:  Procedure Laterality Date   APPENDECTOMY     back epidural   08/2017   BACK SURGERY     CORONARY ANGIOPLASTY WITH STENT PLACEMENT  08/19/2012   KNEE SURGERY     Patient Active Problem List   Diagnosis Date Noted   High risk medication use 10/28/2021   Olecranon bursitis, left elbow 04/21/2021   Personal history of COVID-19 04/13/2021   Primary osteoarthritis, 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   Essential hypertension 06/04/2020   Pure hypercholesterolemia 06/04/2020   CAD in native artery 06/04/2020   Parkinson's disease 06/04/2020   Diabetes mellitus type 2 in nonobese (HCC) 06/04/2020   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   Iron deficiency anemia 11/14/2019   Unilateral primary  osteoarthritis, left knee 04/01/2019   Chronic left-sided low back pain with left-sided sciatica 04/01/2019   Lumbar radiculopathy 08/21/2017   Chronic pain syndrome 08/04/2017   Facet arthropathy 08/04/2017   Lumbar paraspinal muscle spasm 08/04/2017   Anemia of chronic disease 07/03/2017   Dizziness 11/10/2016   Hyperlipidemia, unspecified 10/27/2016   Other intervertebral disc degeneration, lumbar region 06/30/2016   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 heart with stable angina pectoris (HCC) 02/26/2016   SOB (shortness of breath) 02/26/2016   Old MI (myocardial infarction) 02/26/2016   OAB (overactive bladder) 09/05/2013   Ischemic heart disease 09/21/2012   Bell's palsy 06/14/2012    ONSET DATE: 03/01/2023 (MD referral)  REFERRING DIAG:  G20.B2 (ICD-10-CM) - Parkinson's disease with dyskinesia and fluctuating manifestations  R29.6 (ICD-10-CM) - Frequent falls    THERAPY DIAG:  Other abnormalities of gait and mobility  Unsteadiness on feet  Muscle weakness (generalized)  Rationale for Evaluation and Treatment: Rehabilitation  SUBJECTIVE:  SUBJECTIVE STATEMENT: "I'm vertical."  Been doing the exercises.  Waiting to hear back from the doctor about scheduling the knee surgery.   Pt accompanied by: significant other  PERTINENT HISTORY: CAD, DM, HTN, hx of back surgery; sciatica *(Per wife report, pt to have L TKR in 6-8 weeks due to bone on bone arthritis)  PAIN:  Are you having pain? Yes: NPRS scale: 3-4/10 Pain location: low back/buttocks, knee Pain description: achy Aggravating factors: standing too long Relieving factors: sitting Standing for any length of time brings on pain in buttocks   PRECAUTIONS: Fall  WEIGHT BEARING RESTRICTIONS: No  FALLS: Has patient  fallen in last 6 months? Yes. Number of falls 6  LIVING ENVIRONMENT: Lives with: lives with their spouse Lives in: House/apartment Stairs: Yes: Internal: 6 steps; on right going up and External: 1 steps; none Has following equipment at home: Single point cane  PLOF: Independent with household mobility with device, Independent with community mobility with device, and Leisure: does yoga and does cycling; does stretches for his back  PATIENT GOALS: To get stronger before upcoming knee surgery  OBJECTIVE:     TODAY'S TREATMENT: 03/20/2023 Activity Comments  STS x 10 reps from mat surface, 22" Good form, able to self correct x 2 with slight posterior lean  Reviewed wall bumps Pt return demo understanding  Heel/toe raises x 8 reps Initially able to go up on toes, then fatigues  Wide BOS lateral weightshift x 8 reps 1 UE reach/lift, increased instability to L side with repetition  -Forward step and weightshift/return to midline -side step and return to midline -back step and return to midline 2 x 5 reps BUE support, lessening UE support  -Standing EO/EC feet apart, feet together, 30 sec 2 reps, no UE support, min guard  Forward/back walking in parallel bars, 3 reps   Marching forward/back 2 reps       HOME EXERCISE PROGRAM Last updated: 03/17/23 Access Code: [email or web address] URL: [email or web address] Date: 03/17/2023 Prepared by: [facility] - Outpatient  Rehab - [facility]  Exercises - Standing Anterior Posterior Weight Shift (wall bumps)  - 1 x daily - 5 x weekly - 2 sets - 10 reps   PATIENT EDUCATION: Education details: Review of exercises, continue current routine Person educated: Patient Education method: Explanation, Demonstration, Tactile cues, Verbal cues, and Handouts Education comprehension: verbalized understanding and returned demonstration   ---------------------------------------------------- Objective information below taken at initial  evaluation:   DIAGNOSTIC FINDINGS: NA for this episode  COGNITION: Overall cognitive status: Within functional limits for tasks assessed and slowed response time for questions, but answers WFL   SENSATION: Light touch: WFL  COORDINATION: Slowed BLE movement patterns   POSTURE: rounded shoulders, flexed trunk , and weight shift right  LOWER EXTREMITY ROM:   A/ROM WFL grossly assessed in sitting  LOWER EXTREMITY MMT:    MMT Right Eval Left Eval  Hip flexion 4 5  Hip extension    Hip abduction    Hip adduction    Hip internal rotation    Hip external rotation    Knee flexion 4 5  Knee extension 4 5  Ankle dorsiflexion 3+ 3+  Ankle plantarflexion 3- 3-  Ankle inversion    Ankle eversion    (Blank rows = not tested)    TRANSFERS: Assistive device utilized: None  Sit to stand: SBA and with BUE support Stand to sit: SBA and with BUE support GAIT: Gait pattern: step through pattern, decreased step length- Right, decreased step length-  Left, decreased ankle dorsiflexion- Left, lateral lean- Right, trunk rotated posterior- Right, trunk flexed, and narrow BOS Distance walked: 40 ft Assistive device utilized: Single point cane Level of assistance: SBA and CGA Comments: Unstable, slowed gait pattern  FUNCTIONAL TESTS:  5 times sit to stand: 26.06 with UE support Timed up and go (TUG): NT 10 meter walk test: NT Berg Balance test:  23/56 *En bloc turn noted; with squat to floor, pt has difficulty with LOB getting up and needs assist to return safety to stand      TODAY'S TREATMENT:                                                                                                                              DATE: 03/07/2023    PATIENT EDUCATION: Education details: PT eval results, POC Person educated: Patient and Spouse Education method: Explanation Education comprehension: verbalized understanding  HOME EXERCISE PROGRAM: Not yet initiated  GOALS: Goals  reviewed with patient? Yes  SHORT TERM GOALS: Target date: 03/31/2023  Pt will be supervision with HEP for improved strength, transfers, balance, gait. Baseline: Goal status: INITIAL  2.  Pt will improve 5x sit<>stand to less than or equal to 20 sec to demonstrate improved functional strength and transfer efficiency. Baseline: 26.06 sec Goal status: IN PROGRESS  3.  Pt will verbalize understanding of fall prevention in home environment.   Baseline:  6 falls in 6 months  Goal status:  MET 03/13/23   LONG TERM GOALS: Target date: 04/14/2023  Pt will be supervision with HEP for improved strength, transfers, balance, gait. Baseline:  Goal status: IN PROGRESS  2.  Pt will improve 5x sit<>stand to less than or equal to 15 sec to demonstrate improved functional strength and transfer efficiency. Baseline: 26.06 sec Goal status: IN PROGRESS  3.  Pt will improve Berg score to at least 32/56 to decrease fall risk. Baseline: 23/56 Goal status: IN PROGRESS  4.  Gait velocity to be assessed and goal to be set as appropriate. (Imrpove to at least 2 ft/sec for improved safety with gait) Baseline: 1.66 ft/sec 03/10/2023 Goal status: IN PROGRESS  5.  Pt will verbalize understanding of local Parkinson's disease community resources.  Baseline:  Goal status: IN PROGRESS  ASSESSMENT:  CLINICAL IMPRESSION: Skilled PT session continued to focus on functional strength, balance strategy and dynamic balance.  He is able to lessen support through BUEs with repetition in session and he reports less pain at end of session.  Only one episode of slight retropulsion with sit<>stand reps.  He is able to perform EO and EC on solid surface for 30 seconds, increased sway with EC.  He will continue to benefit from skilled PT towards goals for improved functional mobility and balance.  OBJECTIVE IMPAIRMENTS: Abnormal gait, decreased balance, decreased mobility, difficulty walking, decreased strength, and postural  dysfunction.   ACTIVITY LIMITATIONS: standing, squatting, stairs, transfers, and locomotion level  PARTICIPATION LIMITATIONS: community activity  PERSONAL FACTORS: 3+ comorbidities: see PMH above  are also affecting patient's functional outcome.   REHAB POTENTIAL: Good  CLINICAL DECISION MAKING: Evolving/moderate complexity  EVALUATION COMPLEXITY: Moderate  PLAN:  PT FREQUENCY: 1-2x/week  PT DURATION: 6 weeks  PLANNED INTERVENTIONS: Therapeutic exercises, Therapeutic activity, Neuromuscular re-education, Balance training, Gait training, Patient/Family education, Self Care, Stair training, and DME instructions  PLAN FOR NEXT SESSION: continue work on (modified) standing PWR! Moves and balance strategy work, sit<>stand transfers   Lonia Blood, PT 03/20/23 1:18 PM Phone: [phone] Fax: [phone]  [facility] Out[facility] Rehab at [facility] [address], [address] [place], [place] [address] Phone # [phone] Fax # [phone]

## 2023-03-24 ENCOUNTER — Ambulatory Visit: Payer: Medicare HMO | Admitting: Physical Therapy

## 2023-03-24 ENCOUNTER — Encounter: Payer: Self-pay | Admitting: Physical Therapy

## 2023-03-24 DIAGNOSIS — R2689 Other abnormalities of gait and mobility: Secondary | ICD-10-CM | POA: Diagnosis not present

## 2023-03-24 DIAGNOSIS — R29818 Other symptoms and signs involving the nervous system: Secondary | ICD-10-CM

## 2023-03-24 DIAGNOSIS — R2681 Unsteadiness on feet: Secondary | ICD-10-CM

## 2023-03-24 DIAGNOSIS — M6281 Muscle weakness (generalized): Secondary | ICD-10-CM

## 2023-03-24 NOTE — Therapy (Signed)
OUTPATIENT PHYSICAL THERAPY NEURO TREATMENT NOTE   Patient Name: Ronald [name] MRN: [id number] DOB:1951-01-19, 72 y.o., male Today's Date: 03/27/2023   PCP: Woodroe Chen, MD REFERRING PROVIDER: Butch Penny, NP   END OF SESSION:  PT End of Session - 03/27/23 1013     Visit Number 7    Number of Visits 12    Date for PT Re-Evaluation 04/14/23    Authorization Type [employer]    Authorization Time Period 03/07/2023-04/14/2023    Authorization - Visit Number 7    Authorization - Number of Visits 12    Progress Note Due on Visit 10    PT Start Time 0937   pt late   PT Stop Time 1013    PT Time Calculation (min) 36 min    Activity Tolerance Patient tolerated treatment well    Behavior During Therapy [facility] for tasks assessed/performed                 Past Medical History:  Diagnosis Date   CAD in native artery 06/04/2020   RCA infarct 2013.  S/p RCA and OM PCI   Diabetes mellitus type 2 in nonobese (HCC) 06/04/2020   Diabetes mellitus without complication (HCC)    Essential hypertension 06/04/2020   Hypertension    Parkinson's disease    Parkinson's disease 06/04/2020   Pure hypercholesterolemia 06/04/2020   Past Surgical History:  Procedure Laterality Date   APPENDECTOMY     back epidural   08/2017   BACK SURGERY     CORONARY ANGIOPLASTY WITH STENT PLACEMENT  08/19/2012   KNEE SURGERY     Patient Active Problem List   Diagnosis Date Noted   High risk medication use 10/28/2021   Olecranon bursitis, left elbow 04/21/2021   Personal history of COVID-19 04/13/2021   Primary osteoarthritis, 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   Essential hypertension 06/04/2020   Pure hypercholesterolemia 06/04/2020   CAD in native artery 06/04/2020   Parkinson's disease 06/04/2020   Diabetes mellitus type 2 in nonobese (HCC) 06/04/2020   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   Iron deficiency anemia 11/14/2019   Unilateral primary osteoarthritis, left knee 04/01/2019   Chronic  left-sided low back pain with left-sided sciatica 04/01/2019   Lumbar radiculopathy 08/21/2017   Chronic pain syndrome 08/04/2017   Facet arthropathy 08/04/2017   Lumbar paraspinal muscle spasm 08/04/2017   Anemia of chronic disease 07/03/2017   Dizziness 11/10/2016   Hyperlipidemia, unspecified 10/27/2016   Other intervertebral disc degeneration, lumbar region 06/30/2016   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 heart with stable angina pectoris (HCC) 02/26/2016   SOB (shortness of breath) 02/26/2016   Old MI (myocardial infarction) 02/26/2016   OAB (overactive bladder) 09/05/2013   Ischemic heart disease 09/21/2012   Bell's palsy 06/14/2012    ONSET DATE: 03/01/2023 (MD referral)  REFERRING DIAG:  G20.B2 (ICD-10-CM) - Parkinson's disease with dyskinesia and fluctuating manifestations  R29.6 (ICD-10-CM) - Frequent falls    THERAPY DIAG:  Other abnormalities of gait and mobility  Unsteadiness on feet  Muscle weakness (generalized)  Other symptoms and signs involving the nervous system  Rationale for Evaluation and Treatment: Rehabilitation  SUBJECTIVE:  SUBJECTIVE STATEMENT: "Rough weekend- too hot." Has been trying to kill a ground hog in his shed.   Pt accompanied by: self  PERTINENT HISTORY: CAD, DM, HTN, hx of back surgery; sciatica *(Per wife report, pt to have L TKR in 6-8 weeks due to bone on bone arthritis)  PAIN:  Are you having pain? Yes: NPRS scale: 5/10 Pain location: low back/buttocks, knee Pain description: achy Aggravating factors: standing too long Relieving factors: sitting Standing for any length of time brings on pain in buttocks   PRECAUTIONS: Fall  WEIGHT BEARING RESTRICTIONS: No  FALLS: Has patient fallen in last 6 months? Yes. Number of falls  6  LIVING ENVIRONMENT: Lives with: lives with their spouse Lives in: House/apartment Stairs: Yes: Internal: 6 steps; on right going up and External: 1 steps; none Has following equipment at home: Single point cane  PLOF: Independent with household mobility with device, Independent with community mobility with device, and Leisure: does yoga and does cycling; does stretches for his back  PATIENT GOALS: To get stronger before upcoming knee surgery  OBJECTIVE:     TODAY'S TREATMENT: 03/24/23 Activity Comments  5xSTS 15.96 sec with B UEs on armrests  review of HEP: sitting PWR moves: Up 10x Rock 10x Step 10x   wall bumps 5x EO, 3x EC (shoulder strategy)  sitting ankle DF with green TB 10x each sitting heel raise 10x sitting LAQ 3# 10x each LE (also tried with pt's ankle # but it was too light) sitting HS curl with green TB 10x each LE Good form overall; limited eccentric control with heel raises. Cueing to stretch opposite LE out with PWR! Rock- pt somewhat limited d/t knee pain. Good ability to shift wt anteriorly with wall bumps    PATIENT EDUCATION: Education details: edu on progress towards goals; edu on adjustable ankle wts, review of HEP Person educated: Patient Education method: Explanation, Demonstration, Tactile cues, and Verbal cues Education comprehension: verbalized understanding and returned demonstration    Access Code: [email or web address] URL: [email or web address] Date: 03/24/2023 Prepared by: [facility] - Outpatient  Rehab - [facility]  Exercises - Standing Anterior Posterior Weight Shift  - 1 x daily - 5 x weekly - 2 sets - 10 reps - Seated Ankle Dorsiflexion with Resistance  - 1 x daily - 5 x weekly - 2-3 sets - 10 reps - 3 sec hold - Seated Heel Raise  - 1 x daily - 5 x weekly - 2-3 sets - 10 reps - 3 sec hold - Seated Long Arc Quad  - 1 x daily - 5 x weekly - 2-3 sets - 10 reps - Seated Hamstring Curl with Anchored Resistance  - 1 x daily - 5 x  weekly - 3 sets - 10 reps    ---------------------------------------------------- Objective information below taken at initial evaluation:   DIAGNOSTIC FINDINGS: NA for this episode  COGNITION: Overall cognitive status: Within functional limits for tasks assessed and slowed response time for questions, but answers WFL   SENSATION: Light touch: WFL  COORDINATION: Slowed BLE movement patterns   POSTURE: rounded shoulders, flexed trunk , and weight shift right  LOWER EXTREMITY ROM:   A/ROM WFL grossly assessed in sitting  LOWER EXTREMITY MMT:    MMT Right Eval Left Eval  Hip flexion 4 5  Hip extension    Hip abduction    Hip adduction    Hip internal rotation    Hip external rotation    Knee flexion 4 5  Knee extension 4  5  Ankle dorsiflexion 3+ 3+  Ankle plantarflexion 3- 3-  Ankle inversion    Ankle eversion    (Blank rows = not tested)    TRANSFERS: Assistive device utilized: None  Sit to stand: SBA and with BUE support Stand to sit: SBA and with BUE support GAIT: Gait pattern: step through pattern, decreased step length- Right, decreased step length- Left, decreased ankle dorsiflexion- Left, lateral lean- Right, trunk rotated posterior- Right, trunk flexed, and narrow BOS Distance walked: 40 ft Assistive device utilized: Single point cane Level of assistance: SBA and CGA Comments: Unstable, slowed gait pattern  FUNCTIONAL TESTS:  5 times sit to stand: 26.06 with UE support Timed up and go (TUG): NT 10 meter walk test: NT Berg Balance test:  23/56 *En bloc turn noted; with squat to floor, pt has difficulty with LOB getting up and needs assist to return safety to stand      TODAY'S TREATMENT:                                                                                                                              DATE: 03/07/2023    PATIENT EDUCATION: Education details: PT eval results, POC Person educated: Patient and Spouse Education method:  Explanation Education comprehension: verbalized understanding  HOME EXERCISE PROGRAM: Not yet initiated  GOALS: Goals reviewed with patient? Yes  SHORT TERM GOALS: Target date: 03/31/2023  Pt will be supervision with HEP for improved strength, transfers, balance, gait. Baseline: Goal status: MET 03/27/23  2.  Pt will improve 5x sit<>stand to less than or equal to 20 sec to demonstrate improved functional strength and transfer efficiency. Baseline: 26.06 sec; 15.96 sec 03/27/23 Goal status: MET 03/27/23  3.  Pt will verbalize understanding of fall prevention in home environment.   Baseline:  6 falls in 6 months  Goal status:  MET 03/13/23   LONG TERM GOALS: Target date: 04/14/2023  Pt will be supervision with HEP for improved strength, transfers, balance, gait. Baseline:  Goal status: IN PROGRESS  2.  Pt will improve 5x sit<>stand to less than or equal to 15 sec to demonstrate improved functional strength and transfer efficiency. Baseline: 26.06 sec; 15.96 sec 03/27/23 Goal status: IN PROGRESS 03/27/23  3.  Pt will improve Berg score to at least 32/56 to decrease fall risk. Baseline: 23/56 Goal status: IN PROGRESS  4.  Gait velocity to be assessed and goal to be set as appropriate. (Imrpove to at least 2 ft/sec for improved safety with gait) Baseline: 1.66 ft/sec 03/10/2023 Goal status: IN PROGRESS  5.  Pt will verbalize understanding of local Parkinson's disease community resources.  Baseline:  Goal status: IN PROGRESS  ASSESSMENT:  CLINICAL IMPRESSION: Patient arrived to session without new complaints. Reviewed HEP for max carryover and benefit; patient required light cueing for muscle control, using increased ankle weight for increased challenge, and promoting proper movement pattern with PWR! Moves. Improved ability to weight shift and use  hip strategy today. Patient has met 5xSTS STG today. All STGs have been met at this time. Patient is progressing well towards goals and  without complaints upon leaving.   OBJECTIVE IMPAIRMENTS: Abnormal gait, decreased balance, decreased mobility, difficulty walking, decreased strength, and postural dysfunction.   ACTIVITY LIMITATIONS: standing, squatting, stairs, transfers, and locomotion level  PARTICIPATION LIMITATIONS: community activity  PERSONAL FACTORS: 3+ comorbidities: see PMH above  are also affecting patient's functional outcome.   REHAB POTENTIAL: Good  CLINICAL DECISION MAKING: Evolving/moderate complexity  EVALUATION COMPLEXITY: Moderate  PLAN:  PT FREQUENCY: 1-2x/week  PT DURATION: 6 weeks  PLANNED INTERVENTIONS: Therapeutic exercises, Therapeutic activity, Neuromuscular re-education, Balance training, Gait training, Patient/Family education, Self Care, Stair training, and DME instructions  PLAN FOR NEXT SESSION: Balance, Continue work on (modified) standing PWR! Moves and balance strategy work, sit<>stand transfers    Anette Guarneri, [place], DPT 03/27/23 10:15 AM  [facility] at [facility] [address] [place], [address] [place], [place] [address] Phone # [phone] Fax # [phone]

## 2023-03-24 NOTE — Therapy (Signed)
OUTPATIENT PHYSICAL THERAPY NEURO TREATMENT NOTE   Patient Name: Ronald [name] MRN: [id number] DOB:09-23-1950, 72 y.o., male Today's Date: 03/24/2023   PCP: Woodroe Chen, MD REFERRING PROVIDER: Butch Penny, NP   END OF SESSION:  PT End of Session - 03/24/23 0935     Visit Number 6    Number of Visits 12    Date for PT Re-Evaluation 04/14/23    Authorization Type [employer]    Authorization Time Period 03/07/2023-04/14/2023    Authorization - Visit Number 6    Authorization - Number of Visits 12    Progress Note Due on Visit 10    PT Start Time 0935    PT Stop Time 1016    PT Time Calculation (min) 41 min    Equipment Utilized During Treatment --    Activity Tolerance Patient tolerated treatment well    Behavior During Therapy [facility] for tasks assessed/performed                Past Medical History:  Diagnosis Date   CAD in native artery 06/04/2020   RCA infarct 2013.  S/p RCA and OM PCI   Diabetes mellitus type 2 in nonobese (HCC) 06/04/2020   Diabetes mellitus without complication (HCC)    Essential hypertension 06/04/2020   Hypertension    Parkinson's disease    Parkinson's disease 06/04/2020   Pure hypercholesterolemia 06/04/2020   Past Surgical History:  Procedure Laterality Date   APPENDECTOMY     back epidural   08/2017   BACK SURGERY     CORONARY ANGIOPLASTY WITH STENT PLACEMENT  08/19/2012   KNEE SURGERY     Patient Active Problem List   Diagnosis Date Noted   High risk medication use 10/28/2021   Olecranon bursitis, left elbow 04/21/2021   Personal history of COVID-19 04/13/2021   Primary osteoarthritis, 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   Essential hypertension 06/04/2020   Pure hypercholesterolemia 06/04/2020   CAD in native artery 06/04/2020   Parkinson's disease 06/04/2020   Diabetes mellitus type 2 in nonobese (HCC) 06/04/2020   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   Iron deficiency anemia 11/14/2019   Unilateral primary osteoarthritis,  left knee 04/01/2019   Chronic left-sided low back pain with left-sided sciatica 04/01/2019   Lumbar radiculopathy 08/21/2017   Chronic pain syndrome 08/04/2017   Facet arthropathy 08/04/2017   Lumbar paraspinal muscle spasm 08/04/2017   Anemia of chronic disease 07/03/2017   Dizziness 11/10/2016   Hyperlipidemia, unspecified 10/27/2016   Other intervertebral disc degeneration, lumbar region 06/30/2016   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 heart with stable angina pectoris (HCC) 02/26/2016   SOB (shortness of breath) 02/26/2016   Old MI (myocardial infarction) 02/26/2016   OAB (overactive bladder) 09/05/2013   Ischemic heart disease 09/21/2012   Bell's palsy 06/14/2012    ONSET DATE: 03/01/2023 (MD referral)  REFERRING DIAG:  G20.B2 (ICD-10-CM) - Parkinson's disease with dyskinesia and fluctuating manifestations  R29.6 (ICD-10-CM) - Frequent falls    THERAPY DIAG:  Other abnormalities of gait and mobility  Unsteadiness on feet  Muscle weakness (generalized)  Other symptoms and signs involving the nervous system  Rationale for Evaluation and Treatment: Rehabilitation  SUBJECTIVE:  SUBJECTIVE STATEMENT: Surgery is scheduled for August 2nd.  Doing pretty good.    Pt accompanied by: self  PERTINENT HISTORY: CAD, DM, HTN, hx of back surgery; sciatica *(Per wife report, pt to have L TKR in 6-8 weeks due to bone on bone arthritis)  PAIN:  Are you having pain? Yes: NPRS scale: 3-4/10 Pain location: low back/buttocks, knee Pain description: achy Aggravating factors: standing too long Relieving factors: sitting Standing for any length of time brings on pain in buttocks   PRECAUTIONS: Fall  WEIGHT BEARING RESTRICTIONS: No  FALLS: Has patient fallen in last 6 months? Yes.  Number of falls 6  LIVING ENVIRONMENT: Lives with: lives with their spouse Lives in: House/apartment Stairs: Yes: Internal: 6 steps; on right going up and External: 1 steps; none Has following equipment at home: Single point cane  PLOF: Independent with household mobility with device, Independent with community mobility with device, and Leisure: does yoga and does cycling; does stretches for his back  PATIENT GOALS: To get stronger before upcoming knee surgery  OBJECTIVE:    TODAY'S TREATMENT: 03/24/2023 Activity Comments  Seated there ex: -ankle plantarflexion  2 x 10, 3 sec hold -ankle dorsiflexion 2 x 10, 3 sec hold, 2nd set with red band -LAQ, 2 x 10, 3# Good form  STS 2 x 5 reps Min use of hands for ease of technique and transfer, good forward lean, no retropulsion  Stagger stance forward/back 10 reps   Wide BOS lateral weightshift with 1 UE lift   Side step and weightshift x 10, alternating sides Progressing to less UE support  No c/o pain at end of session    Access Code: [email or web address] URL: [email or web address] Date: 03/24/2023 Prepared by: [facility] - Outpatient  Rehab - [facility]  Exercises - Standing Anterior Posterior Weight Shift  - 1 x daily - 5 x weekly - 2 sets - 10 reps - Seated Ankle Dorsiflexion with Resistance  - 1 x daily - 5 x weekly - 2-3 sets - 10 reps - 3 sec hold - Seated Heel Raise  - 1 x daily - 5 x weekly - 2-3 sets - 10 reps - 3 sec hold - Seated Long Arc Quad  - 1 x daily - 5 x weekly - 2-3 sets - 10 reps - Seated Hamstring Curl with Anchored Resistance  - 1 x daily - 5 x weekly - 3 sets - 10 reps    PATIENT EDUCATION: Education details: Addition to HEP-see above Person educated: Patient Education method: Explanation, Demonstration, Tactile cues, Verbal cues, and Handouts Education comprehension: verbalized understanding and returned demonstration   ---------------------------------------------------- Objective information  below taken at initial evaluation:   DIAGNOSTIC FINDINGS: NA for this episode  COGNITION: Overall cognitive status: Within functional limits for tasks assessed and slowed response time for questions, but answers WFL   SENSATION: Light touch: WFL  COORDINATION: Slowed BLE movement patterns   POSTURE: rounded shoulders, flexed trunk , and weight shift right  LOWER EXTREMITY ROM:   A/ROM WFL grossly assessed in sitting  LOWER EXTREMITY MMT:    MMT Right Eval Left Eval  Hip flexion 4 5  Hip extension    Hip abduction    Hip adduction    Hip internal rotation    Hip external rotation    Knee flexion 4 5  Knee extension 4 5  Ankle dorsiflexion 3+ 3+  Ankle plantarflexion 3- 3-  Ankle inversion    Ankle eversion    (Blank rows =  not tested)    TRANSFERS: Assistive device utilized: None  Sit to stand: SBA and with BUE support Stand to sit: SBA and with BUE support GAIT: Gait pattern: step through pattern, decreased step length- Right, decreased step length- Left, decreased ankle dorsiflexion- Left, lateral lean- Right, trunk rotated posterior- Right, trunk flexed, and narrow BOS Distance walked: 40 ft Assistive device utilized: Single point cane Level of assistance: SBA and CGA Comments: Unstable, slowed gait pattern  FUNCTIONAL TESTS:  5 times sit to stand: 26.06 with UE support Timed up and go (TUG): NT 10 meter walk test: NT Berg Balance test:  23/56 *En bloc turn noted; with squat to floor, pt has difficulty with LOB getting up and needs assist to return safety to stand      TODAY'S TREATMENT:                                                                                                                              DATE: 03/07/2023    PATIENT EDUCATION: Education details: PT eval results, POC Person educated: Patient and Spouse Education method: Explanation Education comprehension: verbalized understanding  HOME EXERCISE PROGRAM: Not yet  initiated  GOALS: Goals reviewed with patient? Yes  SHORT TERM GOALS: Target date: 03/31/2023  Pt will be supervision with HEP for improved strength, transfers, balance, gait. Baseline: Goal status: INITIAL  2.  Pt will improve 5x sit<>stand to less than or equal to 20 sec to demonstrate improved functional strength and transfer efficiency. Baseline: 26.06 sec Goal status: IN PROGRESS  3.  Pt will verbalize understanding of fall prevention in home environment.   Baseline:  6 falls in 6 months  Goal status:  MET 03/13/23   LONG TERM GOALS: Target date: 04/14/2023  Pt will be supervision with HEP for improved strength, transfers, balance, gait. Baseline:  Goal status: IN PROGRESS  2.  Pt will improve 5x sit<>stand to less than or equal to 15 sec to demonstrate improved functional strength and transfer efficiency. Baseline: 26.06 sec Goal status: IN PROGRESS  3.  Pt will improve Berg score to at least 32/56 to decrease fall risk. Baseline: 23/56 Goal status: IN PROGRESS  4.  Gait velocity to be assessed and goal to be set as appropriate. (Imrpove to at least 2 ft/sec for improved safety with gait) Baseline: 1.66 ft/sec 03/10/2023 Goal status: IN PROGRESS  5.  Pt will verbalize understanding of local Parkinson's disease community resources.  Baseline:  Goal status: IN PROGRESS  ASSESSMENT:  CLINICAL IMPRESSION: Skilled PT session continued to focus on functional strength, balance strategy and dynamic balance.  Added to HEP for leg strengthening.  Pt does well with standing exercises, lessening UE support with step exercises.  He is improving with sit<>stand performance with light UE support.  He does continue to reach out for additional support with gait.  He will continue to benefit from skilled PT towards goals for improved functional mobility and balance.  OBJECTIVE  IMPAIRMENTS: Abnormal gait, decreased balance, decreased mobility, difficulty walking, decreased strength, and  postural dysfunction.   ACTIVITY LIMITATIONS: standing, squatting, stairs, transfers, and locomotion level  PARTICIPATION LIMITATIONS: community activity  PERSONAL FACTORS: 3+ comorbidities: see PMH above  are also affecting patient's functional outcome.   REHAB POTENTIAL: Good  CLINICAL DECISION MAKING: Evolving/moderate complexity  EVALUATION COMPLEXITY: Moderate  PLAN:  PT FREQUENCY: 1-2x/week  PT DURATION: 6 weeks  PLANNED INTERVENTIONS: Therapeutic exercises, Therapeutic activity, Neuromuscular re-education, Balance training, Gait training, Patient/Family education, Self Care, Stair training, and DME instructions  PLAN FOR NEXT SESSION: Balance, review HEP.  Check STGs next week.  Continue work on (modified) standing PWR! Moves and balance strategy work, sit<>stand transfers   Lonia Blood, PT 03/24/23 10:19 AM Phone: [phone] Fax: [phone]  [facility] at [facility] [address] [place], [address] [place], [place] [address] Phone # [phone] Fax # [phone]

## 2023-03-27 ENCOUNTER — Ambulatory Visit: Payer: Medicare HMO | Admitting: Physical Therapy

## 2023-03-27 ENCOUNTER — Encounter: Payer: Self-pay | Admitting: Physical Therapy

## 2023-03-27 DIAGNOSIS — R2681 Unsteadiness on feet: Secondary | ICD-10-CM

## 2023-03-27 DIAGNOSIS — R2689 Other abnormalities of gait and mobility: Secondary | ICD-10-CM

## 2023-03-27 DIAGNOSIS — R29818 Other symptoms and signs involving the nervous system: Secondary | ICD-10-CM

## 2023-03-27 DIAGNOSIS — M6281 Muscle weakness (generalized): Secondary | ICD-10-CM

## 2023-03-28 NOTE — Therapy (Signed)
OUTPATIENT PHYSICAL THERAPY NEURO TREATMENT NOTE   Patient Name: Ronald [name] MRN: [id number] DOB:[birth year], 72 y.o., male Today's Date: 03/28/2023   PCP: Woodroe Chen, MD REFERRING PROVIDER: Butch Penny, NP   END OF SESSION:        Past Medical History:  Diagnosis Date   CAD in native artery 06/04/2020   RCA infarct 2013.  S/p RCA and OM PCI   Diabetes mellitus type 2 in nonobese (HCC) 06/04/2020   Diabetes mellitus without complication (HCC)    Essential hypertension 06/04/2020   Hypertension    Parkinson's disease    Parkinson's disease 06/04/2020   Pure hypercholesterolemia 06/04/2020   Past Surgical History:  Procedure Laterality Date   APPENDECTOMY     back epidural   08/2017   BACK SURGERY     CORONARY ANGIOPLASTY WITH STENT PLACEMENT  08/19/2012   KNEE SURGERY     Patient Active Problem List   Diagnosis Date Noted   High risk medication use 10/28/2021   Olecranon bursitis, left elbow 04/21/2021   Personal history of COVID-19 04/13/2021   Primary osteoarthritis, 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   Essential hypertension 06/04/2020   Pure hypercholesterolemia 06/04/2020   CAD in native artery 06/04/2020   Parkinson's disease 06/04/2020   Diabetes mellitus type 2 in nonobese (HCC) 06/04/2020   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   Iron deficiency anemia 11/14/2019   Unilateral primary osteoarthritis, left knee 04/01/2019   Chronic left-sided low back pain with left-sided sciatica 04/01/2019   Lumbar radiculopathy 08/21/2017   Chronic pain syndrome 08/04/2017   Facet arthropathy 08/04/2017   Lumbar paraspinal muscle spasm 08/04/2017   Anemia of chronic disease 07/03/2017   Dizziness 11/10/2016   Hyperlipidemia, unspecified 10/27/2016   Other intervertebral disc degeneration, lumbar region 06/30/2016   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 heart with stable angina pectoris (HCC) 02/26/2016   SOB (shortness of breath) 02/26/2016   Old MI  (myocardial infarction) 02/26/2016   OAB (overactive bladder) 09/05/2013   Ischemic heart disease 09/21/2012   Bell's palsy 06/14/2012    ONSET DATE: 03/01/2023 (MD referral)  REFERRING DIAG:  G20.B2 (ICD-10-CM) - Parkinson's disease with dyskinesia and fluctuating manifestations  R29.6 (ICD-10-CM) - Frequent falls    THERAPY DIAG:  No diagnosis found.  Rationale for Evaluation and Treatment: Rehabilitation  SUBJECTIVE:                                                                                                                                                                                             SUBJECTIVE STATEMENT: "Rough weekend- too hot." Has been trying to kill a ground hog  in his shed.   Pt accompanied by: self  PERTINENT HISTORY: CAD, DM, HTN, hx of back surgery; sciatica *(Per wife report, pt to have L TKR in 6-8 weeks due to bone on bone arthritis)  PAIN:  Are you having pain? Yes: NPRS scale: 5/10 Pain location: low back/buttocks, knee Pain description: achy Aggravating factors: standing too long Relieving factors: sitting Standing for any length of time brings on pain in buttocks   PRECAUTIONS: Fall  WEIGHT BEARING RESTRICTIONS: No  FALLS: Has patient fallen in last 6 months? Yes. Number of falls 6  LIVING ENVIRONMENT: Lives with: lives with their spouse Lives in: House/apartment Stairs: Yes: Internal: 6 steps; on right going up and External: 1 steps; none Has following equipment at home: Single point cane  PLOF: Independent with household mobility with device, Independent with community mobility with device, and Leisure: does yoga and does cycling; does stretches for his back  PATIENT GOALS: To get stronger before upcoming knee surgery  OBJECTIVE:    TODAY'S TREATMENT: 03/29/23 Activity Comments                       Access Code: [email or web address] URL: [email or web address] Date: 03/24/2023 Prepared by: [facility] - Outpatient  Rehab  - [facility]  Exercises - Standing Anterior Posterior Weight Shift  - 1 x daily - 5 x weekly - 2 sets - 10 reps - Seated Ankle Dorsiflexion with Resistance  - 1 x daily - 5 x weekly - 2-3 sets - 10 reps - 3 sec hold - Seated Heel Raise  - 1 x daily - 5 x weekly - 2-3 sets - 10 reps - 3 sec hold - Seated Long Arc Quad  - 1 x daily - 5 x weekly - 2-3 sets - 10 reps - Seated Hamstring Curl with Anchored Resistance  - 1 x daily - 5 x weekly - 3 sets - 10 reps    ---------------------------------------------------- Objective information below taken at initial evaluation:   DIAGNOSTIC FINDINGS: NA for this episode  COGNITION: Overall cognitive status: Within functional limits for tasks assessed and slowed response time for questions, but answers WFL   SENSATION: Light touch: WFL  COORDINATION: Slowed BLE movement patterns   POSTURE: rounded shoulders, flexed trunk , and weight shift right  LOWER EXTREMITY ROM:   A/ROM WFL grossly assessed in sitting  LOWER EXTREMITY MMT:    MMT Right Eval Left Eval  Hip flexion 4 5  Hip extension    Hip abduction    Hip adduction    Hip internal rotation    Hip external rotation    Knee flexion 4 5  Knee extension 4 5  Ankle dorsiflexion 3+ 3+  Ankle plantarflexion 3- 3-  Ankle inversion    Ankle eversion    (Blank rows = not tested)    TRANSFERS: Assistive device utilized: None  Sit to stand: SBA and with BUE support Stand to sit: SBA and with BUE support GAIT: Gait pattern: step through pattern, decreased step length- Right, decreased step length- Left, decreased ankle dorsiflexion- Left, lateral lean- Right, trunk rotated posterior- Right, trunk flexed, and narrow BOS Distance walked: 40 ft Assistive device utilized: Single point cane Level of assistance: SBA and CGA Comments: Unstable, slowed gait pattern  FUNCTIONAL TESTS:  5 times sit to stand: 26.06 with UE support Timed up and go (TUG): NT 10 meter walk  test: NT Berg Balance test:  23/56 *En bloc turn noted; with squat to  floor, pt has difficulty with LOB getting up and needs assist to return safety to stand      TODAY'S TREATMENT:                                                                                                                              DATE: 03/07/2023    PATIENT EDUCATION: Education details: PT eval results, POC Person educated: Patient and Spouse Education method: Explanation Education comprehension: verbalized understanding  HOME EXERCISE PROGRAM: Not yet initiated  GOALS: Goals reviewed with patient? Yes  SHORT TERM GOALS: Target date: 03/31/2023  Pt will be supervision with HEP for improved strength, transfers, balance, gait. Baseline: Goal status: MET 03/27/23  2.  Pt will improve 5x sit<>stand to less than or equal to 20 sec to demonstrate improved functional strength and transfer efficiency. Baseline: 26.06 sec; 15.96 sec 03/27/23 Goal status: MET 03/27/23  3.  Pt will verbalize understanding of fall prevention in home environment.   Baseline:  6 falls in 6 months  Goal status:  MET 03/13/23   LONG TERM GOALS: Target date: 04/14/2023  Pt will be supervision with HEP for improved strength, transfers, balance, gait. Baseline:  Goal status: IN PROGRESS  2.  Pt will improve 5x sit<>stand to less than or equal to 15 sec to demonstrate improved functional strength and transfer efficiency. Baseline: 26.06 sec; 15.96 sec 03/27/23 Goal status: IN PROGRESS 03/27/23  3.  Pt will improve Berg score to at least 32/56 to decrease fall risk. Baseline: 23/56 Goal status: IN PROGRESS  4.  Gait velocity to be assessed and goal to be set as appropriate. (Imrpove to at least 2 ft/sec for improved safety with gait) Baseline: 1.66 ft/sec 03/10/2023 Goal status: IN PROGRESS  5.  Pt will verbalize understanding of local Parkinson's disease community resources.  Baseline:  Goal status: IN  PROGRESS  ASSESSMENT:  CLINICAL IMPRESSION: Patient arrived to session without new complaints. Reviewed HEP for max carryover and benefit; patient required light cueing for muscle control, using increased ankle weight for increased challenge, and promoting proper movement pattern with PWR! Moves. Improved ability to weight shift and use hip strategy today. Patient has met 5xSTS STG today. All STGs have been met at this time. Patient is progressing well towards goals and without complaints upon leaving.   OBJECTIVE IMPAIRMENTS: Abnormal gait, decreased balance, decreased mobility, difficulty walking, decreased strength, and postural dysfunction.   ACTIVITY LIMITATIONS: standing, squatting, stairs, transfers, and locomotion level  PARTICIPATION LIMITATIONS: community activity  PERSONAL FACTORS: 3+ comorbidities: see PMH above  are also affecting patient's functional outcome.   REHAB POTENTIAL: Good  CLINICAL DECISION MAKING: Evolving/moderate complexity  EVALUATION COMPLEXITY: Moderate  PLAN:  PT FREQUENCY: 1-2x/week  PT DURATION: 6 weeks  PLANNED INTERVENTIONS: Therapeutic exercises, Therapeutic activity, Neuromuscular re-education, Balance training, Gait training, Patient/Family education, Self Care, Stair training, and DME instructions  PLAN FOR NEXT SESSION: Balance, Continue work on (modified) standing PWR! Moves and balance  strategy work, sit<>stand transfers    Anette Guarneri, PT, DPT 03/28/23 10:13 AM  [facility] at [facility] [address] [place], [address] [place], [place] [address] Phone # [phone] Fax # [phone]

## 2023-03-29 ENCOUNTER — Encounter: Payer: Self-pay | Admitting: Physical Therapy

## 2023-03-29 ENCOUNTER — Ambulatory Visit: Payer: Medicare HMO | Admitting: Physical Therapy

## 2023-03-29 DIAGNOSIS — R29818 Other symptoms and signs involving the nervous system: Secondary | ICD-10-CM

## 2023-03-29 DIAGNOSIS — R2681 Unsteadiness on feet: Secondary | ICD-10-CM

## 2023-03-29 DIAGNOSIS — R293 Abnormal posture: Secondary | ICD-10-CM

## 2023-03-29 DIAGNOSIS — R2689 Other abnormalities of gait and mobility: Secondary | ICD-10-CM | POA: Diagnosis not present

## 2023-03-29 DIAGNOSIS — M6281 Muscle weakness (generalized): Secondary | ICD-10-CM

## 2023-03-31 ENCOUNTER — Other Ambulatory Visit: Payer: Self-pay

## 2023-04-03 ENCOUNTER — Ambulatory Visit: Payer: Medicare HMO | Admitting: Physical Therapy

## 2023-04-03 ENCOUNTER — Encounter: Payer: Self-pay | Admitting: Physical Therapy

## 2023-04-03 DIAGNOSIS — R29818 Other symptoms and signs involving the nervous system: Secondary | ICD-10-CM

## 2023-04-03 DIAGNOSIS — R2689 Other abnormalities of gait and mobility: Secondary | ICD-10-CM

## 2023-04-03 DIAGNOSIS — M6281 Muscle weakness (generalized): Secondary | ICD-10-CM

## 2023-04-03 DIAGNOSIS — R2681 Unsteadiness on feet: Secondary | ICD-10-CM

## 2023-04-03 NOTE — Therapy (Signed)
OUTPATIENT PHYSICAL THERAPY NEURO TREATMENT NOTE   Patient Name: Ronald [name] MRN: [id number] DOB:[birth year], 72 y.o., male Today's Date: 04/03/2023   PCP: Woodroe Chen, MD REFERRING PROVIDER: Butch Penny, NP   END OF SESSION:  PT End of Session - 04/03/23 0849     Visit Number 9    Number of Visits 12    Date for PT Re-Evaluation 04/14/23    Authorization Type [employer]    Authorization Time Period 03/07/2023-04/14/2023   e-stim unattended was added to auth 7/10-7/26   Authorization - Visit Number 9    Authorization - Number of Visits 12    Progress Note Due on Visit 10    PT Start Time 0849    PT Stop Time 0928    PT Time Calculation (min) 39 min    Equipment Utilized During Treatment Gait belt    Activity Tolerance Patient tolerated treatment well    Behavior During Therapy [facility] for tasks assessed/performed                   Past Medical History:  Diagnosis Date   CAD in native artery 06/04/2020   RCA infarct 2013.  S/p RCA and OM PCI   Diabetes mellitus type 2 in nonobese (HCC) 06/04/2020   Diabetes mellitus without complication (HCC)    Essential hypertension 06/04/2020   Hypertension    Parkinson's disease    Parkinson's disease 06/04/2020   Pure hypercholesterolemia 06/04/2020   Past Surgical History:  Procedure Laterality Date   APPENDECTOMY     back epidural   08/2017   BACK SURGERY     CORONARY ANGIOPLASTY WITH STENT PLACEMENT  08/19/2012   KNEE SURGERY     Patient Active Problem List   Diagnosis Date Noted   High risk medication use 10/28/2021   Olecranon bursitis, left elbow 04/21/2021   Personal history of COVID-19 04/13/2021   Primary osteoarthritis, 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   Essential hypertension 06/04/2020   Pure hypercholesterolemia 06/04/2020   CAD in native artery 06/04/2020   Parkinson's disease 06/04/2020   Diabetes mellitus type 2 in nonobese (HCC) 06/04/2020   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   Iron  deficiency anemia 11/14/2019   Unilateral primary osteoarthritis, left knee 04/01/2019   Chronic left-sided low back pain with left-sided sciatica 04/01/2019   Lumbar radiculopathy 08/21/2017   Chronic pain syndrome 08/04/2017   Facet arthropathy 08/04/2017   Lumbar paraspinal muscle spasm 08/04/2017   Anemia of chronic disease 07/03/2017   Dizziness 11/10/2016   Hyperlipidemia, unspecified 10/27/2016   Other intervertebral disc degeneration, lumbar region 06/30/2016   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 heart with stable angina pectoris (HCC) 02/26/2016   SOB (shortness of breath) 02/26/2016   Old MI (myocardial infarction) 02/26/2016   OAB (overactive bladder) 09/05/2013   Ischemic heart disease 09/21/2012   Bell's palsy 06/14/2012    ONSET DATE: 03/01/2023 (MD referral)  REFERRING DIAG:  G20.B2 (ICD-10-CM) - Parkinson's disease with dyskinesia and fluctuating manifestations  R29.6 (ICD-10-CM) - Frequent falls    THERAPY DIAG:  Other abnormalities of gait and mobility  Unsteadiness on feet  Muscle weakness (generalized)  Other symptoms and signs involving the nervous system  Rationale for Evaluation and Treatment: Rehabilitation  SUBJECTIVE:  SUBJECTIVE STATEMENT: Had a fall reaching for the rail and missed it on the back porch.  Able to get up, didn't hurt myself.  Been using the TENS over the weekend and it has helped.  Pt accompanied by: self  PERTINENT HISTORY: CAD, DM, HTN, hx of back surgery; sciatica *(Per wife report, pt to have L TKR in 6-8 weeks due to bone on bone arthritis)  PAIN:  Are you having pain? Yes: NPRS scale: 2/10 Pain location: L LB Pain description: achy Aggravating factors: standing too long Relieving factors: sitting Standing for any length  of time brings on pain in buttocks   PRECAUTIONS: Fall  WEIGHT BEARING RESTRICTIONS: No  FALLS: Has patient fallen in last 6 months? Yes. Number of falls 6  LIVING ENVIRONMENT: Lives with: lives with their spouse Lives in: House/apartment Stairs: Yes: Internal: 6 steps; on right going up and External: 1 steps; none Has following equipment at home: Single point cane  PLOF: Independent with household mobility with device, Independent with community mobility with device, and Leisure: does yoga and does cycling; does stretches for his back  PATIENT GOALS: To get stronger before upcoming knee surgery  OBJECTIVE:    TODAY'S TREATMENT: 04/03/2023 Activity Comments  NuStep Level 4, 4 extremities x 8:30 minutes SPM>80-90  Educated in Hospital Aware in Care kit and local PD resources   Sit<>Stand 2 x 5 reps from mat surface Cues for initial forward lean and glut activation upon upright standing  Standing modified quadruped PWR! Moves: PWR! UP x 10 PWR! Rock x 10 PWR! Twist x 5 reps each side No c/o, performs well at UE support  Forward step and weightshift x 10 Back step and weightshift x 10 Side step and weightshift x 10 1-2 UE support  Standing strengthening: Sidestep together R<>L x 10 reps Hamstring curls 3# weight        PATIENT EDUCATION: Education details: Parkinson's resources, Aware in Care kit Person educated: Patient Education method: Explanation and Handouts Education comprehension: verbalized understanding   Access Code: [email or web address] URL: [email or web address] Date: 03/24/2023 Prepared by: [facility] - Outpatient  Rehab - [facility]  Exercises - Standing Anterior Posterior Weight Shift  - 1 x daily - 5 x weekly - 2 sets - 10 reps - Seated Ankle Dorsiflexion with Resistance  - 1 x daily - 5 x weekly - 2-3 sets - 10 reps - 3 sec hold - Seated Heel Raise  - 1 x daily - 5 x weekly - 2-3 sets - 10 reps - 3 sec hold - Seated Long Arc Quad  - 1 x  daily - 5 x weekly - 2-3 sets - 10 reps - Seated Hamstring Curl with Anchored Resistance  - 1 x daily - 5 x weekly - 3 sets - 10 reps    ---------------------------------------------------- Objective information below taken at initial evaluation:   DIAGNOSTIC FINDINGS: NA for this episode  COGNITION: Overall cognitive status: Within functional limits for tasks assessed and slowed response time for questions, but answers WFL   SENSATION: Light touch: WFL  COORDINATION: Slowed BLE movement patterns   POSTURE: rounded shoulders, flexed trunk , and weight shift right  LOWER EXTREMITY ROM:   A/ROM WFL grossly assessed in sitting  LOWER EXTREMITY MMT:    MMT Right Eval Left Eval  Hip flexion 4 5  Hip extension    Hip abduction    Hip adduction    Hip internal rotation    Hip external rotation    Knee  flexion 4 5  Knee extension 4 5  Ankle dorsiflexion 3+ 3+  Ankle plantarflexion 3- 3-  Ankle inversion    Ankle eversion    (Blank rows = not tested)    TRANSFERS: Assistive device utilized: None  Sit to stand: SBA and with BUE support Stand to sit: SBA and with BUE support GAIT: Gait pattern: step through pattern, decreased step length- Right, decreased step length- Left, decreased ankle dorsiflexion- Left, lateral lean- Right, trunk rotated posterior- Right, trunk flexed, and narrow BOS Distance walked: 40 ft Assistive device utilized: Single point cane Level of assistance: SBA and CGA Comments: Unstable, slowed gait pattern  FUNCTIONAL TESTS:  5 times sit to stand: 26.06 with UE support Timed up and go (TUG): NT 10 meter walk test: NT Berg Balance test:  23/56 *En bloc turn noted; with squat to floor, pt has difficulty with LOB getting up and needs assist to return safety to stand      TODAY'S TREATMENT:                                                                                                                              DATE: 03/07/2023     PATIENT EDUCATION: Education details: PT eval results, POC Person educated: Patient and Spouse Education method: Explanation Education comprehension: verbalized understanding  HOME EXERCISE PROGRAM: Not yet initiated  GOALS: Goals reviewed with patient? Yes  SHORT TERM GOALS: Target date: 03/31/2023  Pt will be supervision with HEP for improved strength, transfers, balance, gait. Baseline: Goal status: MET 03/27/23  2.  Pt will improve 5x sit<>stand to less than or equal to 20 sec to demonstrate improved functional strength and transfer efficiency. Baseline: 26.06 sec; 15.96 sec 03/27/23 Goal status: MET 03/27/23  3.  Pt will verbalize understanding of fall prevention in home environment.   Baseline:  6 falls in 6 months  Goal status:  MET 03/13/23   LONG TERM GOALS: Target date: 04/14/2023  Pt will be supervision with HEP for improved strength, transfers, balance, gait. Baseline:  Goal status: IN PROGRESS  2.  Pt will improve 5x sit<>stand to less than or equal to 15 sec to demonstrate improved functional strength and transfer efficiency. Baseline: 26.06 sec; 15.96 sec 03/27/23 Goal status: IN PROGRESS 03/27/23  3.  Pt will improve Berg score to at least 32/56 to decrease fall risk. Baseline: 23/56 Goal status: IN PROGRESS  4.  Gait velocity to be assessed and goal to be set as appropriate. (Imrpove to at least 2 ft/sec for improved safety with gait) Baseline: 1.66 ft/sec 03/10/2023 Goal status: IN PROGRESS  5.  Pt will verbalize understanding of local Parkinson's disease community resources.  Baseline:  Goal status: IN PROGRESS  ASSESSMENT:  CLINICAL IMPRESSION: Skilled PT session focused on strengthening and balance activities.  Also, provided education in regards to Aware in Care kit and Local PD resources, especially given pt's upcoming surgery planned and takes PD medications multiple times  during the day.  Tried Modified quadruped PWR! Moves at counter, with pt  tolerating well; he does tend to use bilat UE support and needs postural cues throughout.  He reports using TENS at home over the weekend, and back pain is low today, 2/10.  He will continue to benefit from skilled PT towards remaining goals for improved functional mobility and balance.    OBJECTIVE IMPAIRMENTS: Abnormal gait, decreased balance, decreased mobility, difficulty walking, decreased strength, and postural dysfunction.   ACTIVITY LIMITATIONS: standing, squatting, stairs, transfers, and locomotion level  PARTICIPATION LIMITATIONS: community activity  PERSONAL FACTORS: 3+ comorbidities: see PMH above  are also affecting patient's functional outcome.   REHAB POTENTIAL: Good  CLINICAL DECISION MAKING: Evolving/moderate complexity  EVALUATION COMPLEXITY: Moderate  PLAN:  PT FREQUENCY: 1-2x/week  PT DURATION: 6 weeks  PLANNED INTERVENTIONS: Therapeutic exercises, Therapeutic activity, Neuromuscular re-education, Balance training, Gait training, Patient/Family education, Self Care, Stair training, and DME instructions  PLAN FOR NEXT SESSION: 10th Visit PN; Update/add balance to HEP, Continue work on (modified) standing PWR! Moves and balance strategy work, sit<>stand transfers    Lonia Blood, PT 04/03/23 9:30 AM Phone: [phone] Fax: [phone]  [facility] Outpatient Rehab at [facility] [address] [place], [place] [address] Phone # [phone] Fax # [phone]

## 2023-04-03 NOTE — Patient Instructions (Signed)
Aware in Care Kit-Parkinson's resources for hospital  [email or web address]

## 2023-04-05 ENCOUNTER — Encounter: Payer: Self-pay | Admitting: Physical Therapy

## 2023-04-05 ENCOUNTER — Ambulatory Visit: Payer: Medicare HMO | Admitting: Physical Therapy

## 2023-04-05 DIAGNOSIS — M6281 Muscle weakness (generalized): Secondary | ICD-10-CM

## 2023-04-05 DIAGNOSIS — R2689 Other abnormalities of gait and mobility: Secondary | ICD-10-CM | POA: Diagnosis not present

## 2023-04-05 DIAGNOSIS — R29818 Other symptoms and signs involving the nervous system: Secondary | ICD-10-CM

## 2023-04-05 DIAGNOSIS — R2681 Unsteadiness on feet: Secondary | ICD-10-CM

## 2023-04-05 NOTE — Therapy (Signed)
OUTPATIENT PHYSICAL THERAPY NEURO TREATMENT NOTE/PROGRESS NOTE   Patient Name: Ronald [name] MRN: [id number] DOB:1950/11/13, 72 y.o., male Today's Date: 04/05/2023   PCP: Woodroe Chen, MD REFERRING PROVIDER: Butch Penny, NP   Progress Note Reporting Period 03/07/2023 to 04/05/2023  See note below for Objective Data and Assessment of Progress/Goals.      END OF SESSION:  PT End of Session - 04/05/23 1445     Visit Number 10    Number of Visits 12    Date for PT Re-Evaluation 04/14/23    Authorization Type [employer]    Authorization Time Period 03/07/2023-04/14/2023   e-stim unattended was added to auth 7/10-7/26   Authorization - Number of Visits 12    Progress Note Due on Visit 10    PT Start Time 1446    PT Stop Time 1526    PT Time Calculation (min) 40 min    Equipment Utilized During Treatment Gait belt    Activity Tolerance Patient tolerated treatment well    Behavior During Therapy WFL for tasks assessed/performed                    Past Medical History:  Diagnosis Date   CAD in native artery 06/04/2020   RCA infarct 2013.  S/p RCA and OM PCI   Diabetes mellitus type 2 in nonobese (HCC) 06/04/2020   Diabetes mellitus without complication (HCC)    Essential hypertension 06/04/2020   Hypertension    Parkinson's disease    Parkinson's disease 06/04/2020   Pure hypercholesterolemia 06/04/2020   Past Surgical History:  Procedure Laterality Date   APPENDECTOMY     back epidural   08/2017   BACK SURGERY     CORONARY ANGIOPLASTY WITH STENT PLACEMENT  08/19/2012   KNEE SURGERY     Patient Active Problem List   Diagnosis Date Noted   High risk medication use 10/28/2021   Olecranon bursitis, left elbow 04/21/2021   Personal history of COVID-19 04/13/2021   Primary osteoarthritis, 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   Essential hypertension 06/04/2020   Pure hypercholesterolemia 06/04/2020   CAD in native artery 06/04/2020   Parkinson's disease  06/04/2020   Diabetes mellitus type 2 in nonobese (HCC) 06/04/2020   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   Iron deficiency anemia 11/14/2019   Unilateral primary osteoarthritis, left knee 04/01/2019   Chronic left-sided low back pain with left-sided sciatica 04/01/2019   Lumbar radiculopathy 08/21/2017   Chronic pain syndrome 08/04/2017   Facet arthropathy 08/04/2017   Lumbar paraspinal muscle spasm 08/04/2017   Anemia of chronic disease 07/03/2017   Dizziness 11/10/2016   Hyperlipidemia, unspecified 10/27/2016   Other intervertebral disc degeneration, lumbar region 06/30/2016   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 heart with stable angina pectoris (HCC) 02/26/2016   SOB (shortness of breath) 02/26/2016   Old MI (myocardial infarction) 02/26/2016   OAB (overactive bladder) 09/05/2013   Ischemic heart disease 09/21/2012   Bell's palsy 06/14/2012    ONSET DATE: 03/01/2023 (MD referral)  REFERRING DIAG:  G20.B2 (ICD-10-CM) - Parkinson's disease with dyskinesia and fluctuating manifestations  R29.6 (ICD-10-CM) - Frequent falls    THERAPY DIAG:  Other abnormalities of gait and mobility  Unsteadiness on feet  Muscle weakness (generalized)  Other symptoms and signs involving the nervous system  Rationale for Evaluation and Treatment: Rehabilitation  SUBJECTIVE:  SUBJECTIVE STATEMENT: No other falls, just watch what I'm doing.  Pt accompanied by: self  PERTINENT HISTORY: CAD, DM, HTN, hx of back surgery; sciatica *(Per wife report, pt to have L TKR in 6-8 weeks due to bone on bone arthritis)  PAIN:  Are you having pain? Yes: NPRS scale: 0/10 Pain location: L LB Pain description: achy Aggravating factors: standing too long Relieving factors: sitting Standing for any  length of time brings on pain in buttocks   PRECAUTIONS: Fall  WEIGHT BEARING RESTRICTIONS: No  FALLS: Has patient fallen in last 6 months? Yes. Number of falls 6  LIVING ENVIRONMENT: Lives with: lives with their spouse Lives in: House/apartment Stairs: Yes: Internal: 6 steps; on right going up and External: 1 steps; none Has following equipment at home: Single point cane  PLOF: Independent with household mobility with device, Independent with community mobility with device, and Leisure: does yoga and does cycling; does stretches for his back  PATIENT GOALS: To get stronger before upcoming knee surgery  OBJECTIVE:    TODAY'S TREATMENT: 04/05/2023 Activity Comments  NuStep Level 4, 4 extremities x 10:00 minutes SPM>80-90     Sit<>Stand 2 x 5 reps from mat surface Cues for initial forward lean and glut activation upon upright standing  Sit<>stand x 5 reps standing on Airex Good form and balance after 1st rep  Standing modified quadruped PWR! Moves: PWR! UP x 10 PWR! Rock x 10 PWR! Twist x 5 reps each side No c/o, performs well at UE support  Forward >back step and weightshift x 10 Side step and weightshift x 10 1-2 UE support-cues to lighten UE support  Standing balance: -Sidestep along treadmill bar, 3 x 8 ft, each direction Hamstring curls -forward/back walking along bar, 3 reps   Wide BOS squats to simulate picking up objects from floor, x 5 reps Cues for wider BOS, as he starts narrow and tends to tip forward  Gait velocity:  26M walk:  15.78 sec = 2.08 ft/sec With cane        PATIENT EDUCATION: Education details: HEP additions for balance-Modified quadruped/standing PWR! Moves at [employer] Person educated: Patient Education method: Explanation, Demonstration, and Handouts Education comprehension: verbalized understanding, returned demonstration, and needs further education   Access Code: [email or web address] URL: [email or web address] Date:  03/24/2023>04/05/2023 Prepared by: [facility] - Outpatient  Rehab - [facility]  Exercises - Standing Anterior Posterior Weight Shift  - 1 x daily - 5 x weekly - 2 sets - 10 reps - Seated Ankle Dorsiflexion with Resistance  - 1 x daily - 5 x weekly - 2-3 sets - 10 reps - 3 sec hold - Seated Heel Raise  - 1 x daily - 5 x weekly - 2-3 sets - 10 reps - 3 sec hold - Seated Long Arc Quad  - 1 x daily - 5 x weekly - 2-3 sets - 10 reps - Seated Hamstring Curl with Anchored Resistance  - 1 x daily - 5 x weekly - 3 sets - 10 reps *Added 04/05/2023 Handouts for Modified quadruped standing PWR! Moves-PWR! Up, PWR! [place], [place]! Twist    ---------------------------------------------------- Objective information below taken at initial evaluation:   DIAGNOSTIC FINDINGS: NA for this episode  COGNITION: Overall cognitive status: Within functional limits for tasks assessed and slowed response time for questions, but answers WFL   SENSATION: Light touch: WFL  COORDINATION: Slowed BLE movement patterns   POSTURE: rounded shoulders, flexed trunk , and weight shift right  LOWER EXTREMITY ROM:  A/ROM WFL grossly assessed in sitting  LOWER EXTREMITY MMT:    MMT Right Eval Left Eval  Hip flexion 4 5  Hip extension    Hip abduction    Hip adduction    Hip internal rotation    Hip external rotation    Knee flexion 4 5  Knee extension 4 5  Ankle dorsiflexion 3+ 3+  Ankle plantarflexion 3- 3-  Ankle inversion    Ankle eversion    (Blank rows = not tested)    TRANSFERS: Assistive device utilized: None  Sit to stand: SBA and with BUE support Stand to sit: SBA and with BUE support GAIT: Gait pattern: step through pattern, decreased step length- Right, decreased step length- Left, decreased ankle dorsiflexion- Left, lateral lean- Right, trunk rotated posterior- Right, trunk flexed, and narrow BOS Distance walked: 40 ft Assistive device utilized: Single point cane Level of assistance:  SBA and CGA Comments: Unstable, slowed gait pattern  FUNCTIONAL TESTS:  5 times sit to stand: 26.06 with UE support Timed up and go (TUG): NT 10 meter walk test: NT Berg Balance test:  23/56 *En bloc turn noted; with squat to floor, pt has difficulty with LOB getting up and needs assist to return safety to stand      TODAY'S TREATMENT:                                                                                                                              DATE: 03/07/2023    PATIENT EDUCATION: Education details: PT eval results, POC Person educated: Patient and Spouse Education method: Explanation Education comprehension: verbalized understanding  HOME EXERCISE PROGRAM: Not yet initiated  GOALS: Goals reviewed with patient? Yes  SHORT TERM GOALS: Target date: 03/31/2023  Pt will be supervision with HEP for improved strength, transfers, balance, gait. Baseline: Goal status: MET 03/27/23  2.  Pt will improve 5x sit<>stand to less than or equal to 20 sec to demonstrate improved functional strength and transfer efficiency. Baseline: 26.06 sec; 15.96 sec 03/27/23 Goal status: MET 03/27/23  3.  Pt will verbalize understanding of fall prevention in home environment.   Baseline:  6 falls in 6 months  Goal status:  MET 03/13/23   LONG TERM GOALS: Target date: 04/14/2023  Pt will be supervision with HEP for improved strength, transfers, balance, gait. Baseline:  Goal status: IN PROGRESS  2.  Pt will improve 5x sit<>stand to less than or equal to 15 sec to demonstrate improved functional strength and transfer efficiency. Baseline: 26.06 sec; 15.96 sec 03/27/23 Goal status: IN PROGRESS 03/27/23  3.  Pt will improve Berg score to at least 32/56 to decrease fall risk. Baseline: 23/56 Goal status: IN PROGRESS  4.  Gait velocity to be assessed and goal to be set as appropriate. (Imrpove to at least 2 ft/sec for improved safety with gait) Baseline: 1.66 ft/sec 03/10/2023 Goal status:  IN PROGRESS  5.  Pt will verbalize understanding of  local Parkinson's disease community resources.  Baseline:  Goal status: IN PROGRESS  ASSESSMENT:  CLINICAL IMPRESSION: 10th Visit Progress Note:  Pt reports no pain today.  He reports that with bending forward to pick up things, he tends to lose his balance.  He reports he will go all the way backwards if he starts that way.  Objective measures:  03/27/23 5x sit<>stand:  15.96 sec (improved from 26.06 sec), 10 M walk:  2.08 ft/sec with cane.  He has met 3 of 3 STGs.  He is progressing towards LTGs.  He has had one recent falls and he remains at fall risk per FTSTS and Berg Score (from eval).  He will benefit from continued skilled PT towards LTGs to further address strengthening and balance.  OBJECTIVE IMPAIRMENTS: Abnormal gait, decreased balance, decreased mobility, difficulty walking, decreased strength, and postural dysfunction.   ACTIVITY LIMITATIONS: standing, squatting, stairs, transfers, and locomotion level  PARTICIPATION LIMITATIONS: community activity  PERSONAL FACTORS: 3+ comorbidities: see PMH above  are also affecting patient's functional outcome.   REHAB POTENTIAL: Good  CLINICAL DECISION MAKING: Evolving/moderate complexity  EVALUATION COMPLEXITY: Moderate  PLAN:  PT FREQUENCY: 1-2x/week  PT DURATION: 6 weeks  PLANNED INTERVENTIONS: Therapeutic exercises, Therapeutic activity, Neuromuscular re-education, Balance training, Gait training, Patient/Family education, Self Care, Stair training, and DME instructions  PLAN FOR NEXT SESSION: Review updates to HEP for modified quadruped/standing PWR! Moves.  Continue balance strategy work, sit<>stand transfers.  Check LTGs and plan for d/c next week.    Lonia Blood, PT 04/05/23 3:33 PM Phone: [phone] Fax: [phone]  [facility] at [facility] [address] [place], [address] [place], [place] [address] Phone # [phone] Fax # [phone]

## 2023-04-06 ENCOUNTER — Ambulatory Visit: Payer: Medicare HMO | Admitting: Orthopaedic Surgery

## 2023-04-07 NOTE — Therapy (Signed)
OUTPATIENT PHYSICAL THERAPY NEURO TREATMENT    Patient Name: Ronald [name] MRN: [id number] DOB:August 21, 1951, 72 y.o., male Today's Date: 04/10/2023   PCP: Woodroe Chen, MD REFERRING PROVIDER: Butch Penny, NP      END OF SESSION:  PT End of Session - 04/10/23 0924     Visit Number 11    Number of Visits 12    Date for PT Re-Evaluation 04/14/23    Authorization Type [employer]    Authorization Time Period 03/07/2023-04/14/2023   e-stim unattended was added to auth 7/10-7/26   Authorization - Visit Number 10    Authorization - Number of Visits 12    Progress Note Due on Visit 10    PT Start Time 0842    PT Stop Time 0927    PT Time Calculation (min) 45 min    Equipment Utilized During Treatment Gait belt    Activity Tolerance Patient tolerated treatment well    Behavior During Therapy [facility] for tasks assessed/performed                     Past Medical History:  Diagnosis Date   CAD in native artery 06/04/2020   RCA infarct 2013.  S/p RCA and OM PCI   Diabetes mellitus type 2 in nonobese (HCC) 06/04/2020   Diabetes mellitus without complication (HCC)    Essential hypertension 06/04/2020   Hypertension    Parkinson's disease    Parkinson's disease 06/04/2020   Pure hypercholesterolemia 06/04/2020   Past Surgical History:  Procedure Laterality Date   APPENDECTOMY     back epidural   08/2017   BACK SURGERY     CORONARY ANGIOPLASTY WITH STENT PLACEMENT  08/19/2012   KNEE SURGERY     Patient Active Problem List   Diagnosis Date Noted   High risk medication use 10/28/2021   Olecranon bursitis, left elbow 04/21/2021   Personal history of COVID-19 04/13/2021   Primary osteoarthritis, 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   Essential hypertension 06/04/2020   Pure hypercholesterolemia 06/04/2020   CAD in native artery 06/04/2020   Parkinson's disease 06/04/2020   Diabetes mellitus type 2 in nonobese (HCC) 06/04/2020   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   Iron  deficiency anemia 11/14/2019   Unilateral primary osteoarthritis, left knee 04/01/2019   Chronic left-sided low back pain with left-sided sciatica 04/01/2019   Lumbar radiculopathy 08/21/2017   Chronic pain syndrome 08/04/2017   Facet arthropathy 08/04/2017   Lumbar paraspinal muscle spasm 08/04/2017   Anemia of chronic disease 07/03/2017   Dizziness 11/10/2016   Hyperlipidemia, unspecified 10/27/2016   Other intervertebral disc degeneration, lumbar region 06/30/2016   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 heart with stable angina pectoris (HCC) 02/26/2016   SOB (shortness of breath) 02/26/2016   Old MI (myocardial infarction) 02/26/2016   OAB (overactive bladder) 09/05/2013   Ischemic heart disease 09/21/2012   Bell's palsy 06/14/2012    ONSET DATE: 03/01/2023 (MD referral)  REFERRING DIAG:  G20.B2 (ICD-10-CM) - Parkinson's disease with dyskinesia and fluctuating manifestations  R29.6 (ICD-10-CM) - Frequent falls    THERAPY DIAG:  Other abnormalities of gait and mobility  Unsteadiness on feet  Muscle weakness (generalized)  Other symptoms and signs involving the nervous system  Abnormal posture  Rationale for Evaluation and Treatment: Rehabilitation  SUBJECTIVE:  SUBJECTIVE STATEMENT: Doing okay.   Pt accompanied by: self  PERTINENT HISTORY: CAD, DM, HTN, hx of back surgery; sciatica *(Per wife report, pt to have L TKR in 6-8 weeks due to bone on bone arthritis)  PAIN:  Are you having pain? Yes: NPRS scale: 2/10 Pain location: L knee Pain description: achy Aggravating factors: standing too long Relieving factors: sitting Standing for any length of time brings on pain in buttocks   PRECAUTIONS: Fall  WEIGHT BEARING RESTRICTIONS: No  FALLS: Has patient fallen in  last 6 months? Yes. Number of falls 6  LIVING ENVIRONMENT: Lives with: lives with their spouse Lives in: House/apartment Stairs: Yes: Internal: 6 steps; on right going up and External: 1 steps; none Has following equipment at home: Single point cane  PLOF: Independent with household mobility with device, Independent with community mobility with device, and Leisure: does yoga and does cycling; does stretches for his back  PATIENT GOALS: To get stronger before upcoming knee surgery  OBJECTIVE:     TODAY'S TREATMENT: 04/10/23 Activity Comments  Nustep L6 x 6 min UEs/LEs   review HEP updates:  Standing modified quadruped PWR! Moves: PWR! UP x 10 PWR! Rock x 10 PWR! Twist x 5 reps each side (set switching) At II bars; 2 episodes of posterior LOB with poor ability to recover, requiring mod A (improved ability to balance when bringing feet closer to bars)  Cues to look at hand with PWR! Twist  C/o L shoulder pain with PWR rock (discontinued and educated on modifying for reduced shoulder strain)  alt posterior step In II bars; performed with 1 UE support; cues to widen BOS  backwards walking In II bars; cues for wider BOS; instability with turns   1/4 and 1/2 turns in II bars Focus on taller steps; hips flexed   standing on incline/decline More difficulty and posterior LOB in "decline" position       PATIENT EDUCATION: Education details: edu on using RW on days when unsteadiness is more prominent; edu on therapy at outpatient PT clinic s/p TKA and provided address for [facility] Person educated: Patient Education method: Explanation, Demonstration, Tactile cues, and Verbal cues Education comprehension: verbalized understanding and returned demonstration    Access Code: [email or web address] URL: [email or web address] Date: 03/24/2023>04/05/2023 Prepared by: [facility] - Outpatient  Rehab - [facility]  Exercises - Standing Anterior Posterior Weight Shift  - 1 x daily  - 5 x weekly - 2 sets - 10 reps - Seated Ankle Dorsiflexion with Resistance  - 1 x daily - 5 x weekly - 2-3 sets - 10 reps - 3 sec hold - Seated Heel Raise  - 1 x daily - 5 x weekly - 2-3 sets - 10 reps - 3 sec hold - Seated Long Arc Quad  - 1 x daily - 5 x weekly - 2-3 sets - 10 reps - Seated Hamstring Curl with Anchored Resistance  - 1 x daily - 5 x weekly - 3 sets - 10 reps *Added 04/05/2023 Handouts for Modified quadruped standing PWR! Moves-PWR! Up, PWR! [place], [place]! Twist    ---------------------------------------------------- Objective information below taken at initial evaluation:   DIAGNOSTIC FINDINGS: NA for this episode  COGNITION: Overall cognitive status: Within functional limits for tasks assessed and slowed response time for questions, but answers WFL   SENSATION: Light touch: WFL  COORDINATION: Slowed BLE movement patterns   POSTURE: rounded shoulders, flexed trunk , and weight shift right  LOWER EXTREMITY ROM:   A/ROM [facility]  grossly assessed in sitting  LOWER EXTREMITY MMT:    MMT Right Eval Left Eval  Hip flexion 4 5  Hip extension    Hip abduction    Hip adduction    Hip internal rotation    Hip external rotation    Knee flexion 4 5  Knee extension 4 5  Ankle dorsiflexion 3+ 3+  Ankle plantarflexion 3- 3-  Ankle inversion    Ankle eversion    (Blank rows = not tested)    TRANSFERS: Assistive device utilized: None  Sit to stand: SBA and with BUE support Stand to sit: SBA and with BUE support GAIT: Gait pattern: step through pattern, decreased step length- Right, decreased step length- Left, decreased ankle dorsiflexion- Left, lateral lean- Right, trunk rotated posterior- Right, trunk flexed, and narrow BOS Distance walked: 40 ft Assistive device utilized: Single point cane Level of assistance: SBA and CGA Comments: Unstable, slowed gait pattern  FUNCTIONAL TESTS:  5 times sit to stand: 26.06 with UE support Timed up and go (TUG): NT 10 meter  walk test: NT Berg Balance test:  23/56 *En bloc turn noted; with squat to floor, pt has difficulty with LOB getting up and needs assist to return safety to stand      TODAY'S TREATMENT:                                                                                                                              DATE: 03/07/2023    PATIENT EDUCATION: Education details: PT eval results, POC Person educated: Patient and Spouse Education method: Explanation Education comprehension: verbalized understanding  HOME EXERCISE PROGRAM: Not yet initiated  GOALS: Goals reviewed with patient? Yes  SHORT TERM GOALS: Target date: 03/31/2023  Pt will be supervision with HEP for improved strength, transfers, balance, gait. Baseline: Goal status: MET 03/27/23  2.  Pt will improve 5x sit<>stand to less than or equal to 20 sec to demonstrate improved functional strength and transfer efficiency. Baseline: 26.06 sec; 15.96 sec 03/27/23 Goal status: MET 03/27/23  3.  Pt will verbalize understanding of fall prevention in home environment.   Baseline:  6 falls in 6 months  Goal status:  MET 03/13/23   LONG TERM GOALS: Target date: 04/14/2023  Pt will be supervision with HEP for improved strength, transfers, balance, gait. Baseline:  Goal status: IN PROGRESS  2.  Pt will improve 5x sit<>stand to less than or equal to 15 sec to demonstrate improved functional strength and transfer efficiency. Baseline: 26.06 sec; 15.96 sec 03/27/23 Goal status: IN PROGRESS 03/27/23  3.  Pt will improve Berg score to at least 32/56 to decrease fall risk. Baseline: 23/56 Goal status: IN PROGRESS  4.  Gait velocity to be assessed and goal to be set as appropriate. (Imrpove to at least 2 ft/sec for improved safety with gait) Baseline: 1.66 ft/sec 03/10/2023 Goal status: IN PROGRESS  5.  Pt will verbalize understanding of local Parkinson's disease  community resources.  Baseline:  Goal status: IN  PROGRESS  ASSESSMENT:  CLINICAL IMPRESSION: Patient arrived to session without new complaints. Reviewed modified quadruped PWR! Moves from last session, providing cues for intended movement pattern and modifications to reduce pain as patient noted some L shoulder pain. Patient with 3 episodes of posterior LOB with standing activities today, requiring mod A. Encouraged use of RW on days of reduced balance such as today- patient reported that he has RW at home. Worked on stepping strategy and balance on uneven surfaces with cues for improve use of balance strategies. Patient was escorted out to car for safety at end of session.   OBJECTIVE IMPAIRMENTS: Abnormal gait, decreased balance, decreased mobility, difficulty walking, decreased strength, and postural dysfunction.   ACTIVITY LIMITATIONS: standing, squatting, stairs, transfers, and locomotion level  PARTICIPATION LIMITATIONS: community activity  PERSONAL FACTORS: 3+ comorbidities: see PMH above  are also affecting patient's functional outcome.   REHAB POTENTIAL: Good  CLINICAL DECISION MAKING: Evolving/moderate complexity  EVALUATION COMPLEXITY: Moderate  PLAN:  PT FREQUENCY: 1-2x/week  PT DURATION: 6 weeks  PLANNED INTERVENTIONS: Therapeutic exercises, Therapeutic activity, Neuromuscular re-education, Balance training, Gait training, Patient/Family education, Self Care, Stair training, and DME instructions  PLAN FOR NEXT SESSION:  Continue balance strategy work, sit<>stand transfers.  Check LTGs and plan for d/c next week.   Anette Guarneri, PT, DPT 04/10/23 9:30 AM  [facility]patient Rehab at [facility] [address] [place], [address] [place], [place] [address] Phone # [phone] Fax # [phone]

## 2023-04-10 ENCOUNTER — Ambulatory Visit: Payer: Medicare HMO | Admitting: Physical Therapy

## 2023-04-10 ENCOUNTER — Encounter: Payer: Self-pay | Admitting: Physical Therapy

## 2023-04-10 DIAGNOSIS — R2689 Other abnormalities of gait and mobility: Secondary | ICD-10-CM

## 2023-04-10 DIAGNOSIS — R29818 Other symptoms and signs involving the nervous system: Secondary | ICD-10-CM

## 2023-04-10 DIAGNOSIS — M6281 Muscle weakness (generalized): Secondary | ICD-10-CM

## 2023-04-10 DIAGNOSIS — R2681 Unsteadiness on feet: Secondary | ICD-10-CM

## 2023-04-10 DIAGNOSIS — R293 Abnormal posture: Secondary | ICD-10-CM

## 2023-04-10 NOTE — Patient Instructions (Signed)
SURGICAL WAITING ROOM VISITATION  Patients having surgery or a procedure may have no more than 2 support people in the waiting area - these visitors may rotate.    Children under the age of 36 must have an adult with them who is not the patient.  Due to an increase in RSV and influenza rates and associated hospitalizations, children ages 52 and under may not visit patients in [facility] hospitals.  If the patient needs to stay at the hospital during part of their recovery, the visitor guidelines for inpatient rooms apply. Pre-op nurse will coordinate an appropriate time for 1 support person to accompany patient in pre-op.  This support person may not rotate.    Please refer to the [facility] website for the visitor guidelines for Inpatients (after your surgery is over and you are in a regular room).    Your procedure is scheduled on: 04/21/23   Report to [facility] Main Entrance    Report to admitting at 7:15 AM   Call this number if you have problems the morning of surgery [phone]   Do not eat food :After Midnight.   After Midnight you may have the following liquids until 6:45 AM DAY OF SURGERY  Water Non-Citrus Juices (without pulp, NO RED-Apple, White grape, White cranberry) Black Coffee (NO MILK/CREAM OR CREAMERS, sugar ok)  Clear Tea (NO MILK/CREAM OR CREAMERS, sugar ok) regular and decaf                             Plain Jell-O (NO RED)                                           Fruit ices (not with fruit pulp, NO RED)                                     Popsicles (NO RED)                                                               Sports drinks like Gatorade (NO RED)    The day of surgery:  Drink ONE (1) Pre-Surgery G2 at 6:45 AM the morning of surgery. Drink in one sitting. Do not sip.  This drink was given to you during your hospital  pre-op appointment visit. Nothing else to drink after completing the  Pre-Surgery G2.          If you have questions,  please contact your surgeon's office.   FOLLOW BOWEL PREP AND ANY ADDITIONAL PRE OP INSTRUCTIONS YOU RECEIVED FROM YOUR SURGEON'S OFFICE!!!     Oral Hygiene is also important to reduce your risk of infection.                                    Remember - BRUSH YOUR TEETH THE MORNING OF SURGERY WITH YOUR REGULAR TOOTHPASTE  DENTURES WILL BE REMOVED PRIOR TO SURGERY PLEASE DO NOT APPLY "Poly grip" OR ADHESIVES!!!  Take these medicines the morning of surgery with A SIP OF WATER: [place]idopa-Levodopa, Metoprolol   DO NOT TAKE ANY ORAL DIABETIC MEDICATIONS DAY OF YOUR SURGERY How to Manage Your Diabetes Before and After Surgery  Why is it important to control my blood sugar before and after surgery? Improving blood sugar levels before and after surgery helps healing and can limit problems. A way of improving blood sugar control is eating a healthy diet by:  Eating less sugar and carbohydrates  Increasing activity/exercise  Talking with your doctor about reaching your blood sugar goals High blood sugars (greater than 180 mg/dL) can raise your risk of infections and slow your recovery, so you will need to focus on controlling your diabetes during the weeks before surgery. Make sure that the doctor who takes care of your diabetes knows about your planned surgery including the date and location.  How do I manage my blood sugar before surgery? Check your blood sugar at least 4 times a day, starting 2 days before surgery, to make sure that the level is not too high or low. Check your blood sugar the morning of your surgery when you wake up and every 2 hours until you get to the Short Stay unit. If your blood sugar is less than 70 mg/dL, you will need to treat for low blood sugar: Do not take insulin. Treat a low blood sugar (less than 70 mg/dL) with  cup of clear juice (cranberry or apple), 4 glucose tablets, OR glucose gel. Recheck blood sugar in 15 minutes after treatment (to make sure it is  greater than 70 mg/dL). If your blood sugar is not greater than 70 mg/dL on recheck, call [phone] for further instructions. Report your blood sugar to the short stay nurse when you get to Short Stay.  If you are admitted to the hospital after surgery: Your blood sugar will be checked by the staff and you will probably be given insulin after surgery (instead of oral diabetes medicines) to make sure you have good blood sugar levels. The goal for blood sugar control after surgery is 80-180 mg/dL.  Reviewed and Endorsed by [facility] Patient Education Committee, August 2015                              You may not have any metal on your body including jewelry, and body piercing             Do not wear lotions, powders, cologne, or deodorant              Men may shave face and neck.   Do not bring valuables to the hospital. [facility] IS NOT             RESPONSIBLE   FOR VALUABLES.   Contacts, glasses, dentures or bridgework may not be worn into surgery.   Bring small overnight bag day of surgery.   DO NOT BRING YOUR HOME MEDICATIONS TO THE HOSPITAL. PHARMACY WILL DISPENSE MEDICATIONS LISTED ON YOUR MEDICATION LIST TO YOU DURING YOUR ADMISSION IN THE HOSPITAL!   Special Instructions: Bring a copy of your healthcare power of attorney and living will documents the day of surgery if you haven't scanned them before.              Please read over the following fact sheets you were given: IF YOU HAVE QUESTIONS ABOUT YOUR PRE-OP INSTRUCTIONS PLEASE CALL [phone]- [place]  If you received a COVID test during your pre-op visit  it is requested that you wear a mask when out in public, stay away from anyone that may not be feeling well and notify your surgeon if you develop symptoms. If you test positive for Covid or have been in contact with anyone that has tested positive in the last 10 days please notify you surgeon.      Pre-operative 5 CHG Bath Instructions   You can play a key  role in reducing the risk of infection after surgery. Your skin needs to be as free of germs as possible. You can reduce the number of germs on your skin by washing with CHG (chlorhexidine gluconate) soap before surgery. CHG is an antiseptic soap that kills germs and continues to kill germs even after washing.   DO NOT use if you have an allergy to chlorhexidine/CHG or antibacterial soaps. If your skin becomes reddened or irritated, stop using the CHG and notify one of our RNs at [phone].   Please shower with the CHG soap starting 4 days before surgery using the following schedule:     Please keep in mind the following:  DO NOT shave, including legs and underarms, starting the day of your first shower.   You may shave your face at any point before/day of surgery.  Place clean sheets on your bed the day you start using CHG soap. Use a clean washcloth (not used since being washed) for each shower. DO NOT sleep with pets once you start using the CHG.   CHG Shower Instructions:  If you choose to wash your hair and private area, wash first with your normal shampoo/soap.  After you use shampoo/soap, rinse your hair and body thoroughly to remove shampoo/soap residue.  Turn the water OFF and apply about 3 tablespoons (45 ml) of CHG soap to a CLEAN washcloth.  Apply CHG soap ONLY FROM YOUR NECK DOWN TO YOUR TOES (washing for 3-5 minutes)  DO NOT use CHG soap on face, private areas, open wounds, or sores.  Pay special attention to the area where your surgery is being performed.  If you are having back surgery, having someone wash your back for you may be helpful. Wait 2 minutes after CHG soap is applied, then you may rinse off the CHG soap.  Pat dry with a clean towel  Put on clean clothes/pajamas   If you choose to wear lotion, please use ONLY the CHG-compatible lotions on the back of this paper.     Additional instructions for the day of surgery: DO NOT APPLY any lotions, deodorants,  cologne, or perfumes.   Put on clean/comfortable clothes.  Brush your teeth.  Ask your nurse before applying any prescription medications to the skin.      CHG Compatible Lotions   Aveeno Moisturizing lotion  Cetaphil Moisturizing Cream  Cetaphil Moisturizing Lotion  Clairol Herbal Essence Moisturizing Lotion, Dry Skin  Clairol Herbal Essence Moisturizing Lotion, Extra Dry Skin  Clairol Herbal Essence Moisturizing Lotion, Normal Skin  Curel Age Defying Therapeutic Moisturizing Lotion with Alpha Hydroxy  Curel Extreme Care Body Lotion  Curel Soothing Hands Moisturizing Hand Lotion  Curel Therapeutic Moisturizing Cream, Fragrance-Free  Curel Therapeutic Moisturizing Lotion, Fragrance-Free  Curel Therapeutic Moisturizing Lotion, Original Formula  Eucerin Daily Replenishing Lotion  Eucerin Dry Skin Therapy Plus Alpha Hydroxy Crme  Eucerin Dry Skin Therapy Plus Alpha Hydroxy Lotion  Eucerin Original Crme  Eucerin Original Lotion  Eucerin Plus Crme Eucerin Plus Lotion  Eucerin TriLipid Replenishing Lotion  Keri Anti-Bacterial Hand Lotion  Keri Deep Conditioning Original Lotion Dry Skin Formula Softly Scented  Keri Deep Conditioning Original Lotion, Fragrance Free Sensitive Skin Formula  Keri Lotion Fast Absorbing Fragrance Free Sensitive Skin Formula  Keri Lotion Fast Absorbing Softly Scented Dry Skin Formula  Keri Original Lotion  Keri Skin Renewal Lotion Keri Silky Smooth Lotion  Keri Silky Smooth Sensitive Skin Lotion  Nivea Body Creamy Conditioning Oil  Nivea Body Extra Enriched Lotion  Nivea Body Original Lotion  Nivea Body Sheer Moisturizing Lotion Nivea Crme  Nivea Skin Firming Lotion  NutraDerm 30 Skin Lotion  NutraDerm Skin Lotion  NutraDerm Therapeutic Skin Cream  NutraDerm Therapeutic Skin Lotion  ProShield Protective Hand Cream  Provon moisturizing lotion   Incentive Spirometer  An incentive spirometer is a tool that can help keep your lungs clear and  active. This tool measures how well you are filling your lungs with each breath. Taking long deep breaths may help reverse or decrease the chance of developing breathing (pulmonary) problems (especially infection) following: A long period of time when you are unable to move or be active. BEFORE THE PROCEDURE  If the spirometer includes an indicator to show your best effort, your nurse or respiratory therapist will set it to a desired goal. If possible, sit up straight or lean slightly forward. Try not to slouch. Hold the incentive spirometer in an upright position. INSTRUCTIONS FOR USE  Sit on the edge of your bed if possible, or sit up as far as you can in bed or on a chair. Hold the incentive spirometer in an upright position. Breathe out normally. Place the mouthpiece in your mouth and seal your lips tightly around it. Breathe in slowly and as deeply as possible, raising the piston or the ball toward the top of the column. Hold your breath for 3-5 seconds or for as long as possible. Allow the piston or ball to fall to the bottom of the column. Remove the mouthpiece from your mouth and breathe out normally. Rest for a few seconds and repeat Steps 1 through 7 at least 10 times every 1-2 hours when you are awake. Take your time and take a few normal breaths between deep breaths. The spirometer may include an indicator to show your best effort. Use the indicator as a goal to work toward during each repetition. After each set of 10 deep breaths, practice coughing to be sure your lungs are clear. If you have an incision (the cut made at the time of surgery), support your incision when coughing by placing a pillow or rolled up towels firmly against it. Once you are able to get out of bed, walk around indoors and cough well. You may stop using the incentive spirometer when instructed by your caregiver.  RISKS AND COMPLICATIONS Take your time so you do not get dizzy or light-headed. If you are in pain,  you may need to take or ask for pain medication before doing incentive spirometry. It is harder to take a deep breath if you are having pain. AFTER USE Rest and breathe slowly and easily. It can be helpful to keep track of a log of your progress. Your caregiver can provide you with a simple table to help with this. If you are using the spirometer at home, follow these instructions: SEEK MEDICAL CARE IF:  You are having difficultly using the spirometer. You have trouble using the spirometer as often as instructed. Your pain medication is not giving  enough relief while using the spirometer. You develop fever of 100.5 F (38.1 C) or higher. SEEK IMMEDIATE MEDICAL CARE IF:  You cough up bloody sputum that had not been present before. You develop fever of 102 F (38.9 C) or greater. You develop worsening pain at or near the incision site. MAKE SURE YOU:  Understand these instructions. Will watch your condition. Will get help right away if you are not doing well or get worse. Document Released: 01/16/2007 Document Revised: 11/28/2011 Document Reviewed: 03/19/2007 [facility] Patient Information 2014 [place], [place].   ________________________________________________________________________

## 2023-04-10 NOTE — Progress Notes (Signed)
COVID Vaccine Completed: yes  Date of COVID positive in last 90 days:  PCP - Woodroe Chen, MD Cardiologist - Chilton Si, MD  Chest x-ray -  EKG - 10/31/22 Epic Stress Test -  ECHO - 11/03/22 Epic Cardiac Cath -  Pacemaker/ICD device last checked: Spinal Cord Stimulator:  Bowel Prep -   Sleep Study -  CPAP -   Fasting Blood Sugar -  Checks Blood Sugar _____ times a day  Last dose of GLP1 agonist-  N/A GLP1 instructions:  N/A   Last dose of SGLT-2 inhibitors-  N/A SGLT-2 instructions: N/A   Blood Thinner Instructions:  Time Aspirin Instructions: ASA 81 Last Dose:  Activity level:  Can go up a flight of stairs and perform activities of daily living without stopping and without symptoms of chest pain or shortness of breath.  Able to exercise without symptoms  Unable to go up a flight of stairs without symptoms of     Anesthesia review: HTN, CAD, old MI, DM2, SOB, anemia, 3 stents  Patient denies shortness of breath, fever, cough and chest pain at PAT appointment  Patient verbalized understanding of instructions that were given to them at the PAT appointment. Patient was also instructed that they will need to review over the PAT instructions again at home before surgery.

## 2023-04-11 ENCOUNTER — Other Ambulatory Visit: Payer: Self-pay

## 2023-04-11 ENCOUNTER — Encounter (HOSPITAL_COMMUNITY): Payer: Self-pay

## 2023-04-11 ENCOUNTER — Encounter (HOSPITAL_COMMUNITY)
Admission: RE | Admit: 2023-04-11 | Discharge: 2023-04-11 | Disposition: A | Discharge status: Home / Self Care | Payer: Medicare HMO | Source: Ambulatory Visit | Attending: Orthopaedic Surgery | Admitting: Orthopaedic Surgery

## 2023-04-11 VITALS — BP 139/85 | HR 62 | Temp 98.0°F | Resp 14 | Ht 68.0 in | Wt 175.6 lb

## 2023-04-11 DIAGNOSIS — E119 Type 2 diabetes mellitus without complications: Secondary | ICD-10-CM

## 2023-04-11 DIAGNOSIS — Z01812 Encounter for preprocedural laboratory examination: Secondary | ICD-10-CM | POA: Insufficient documentation

## 2023-04-11 DIAGNOSIS — G20A1 Parkinson's disease without dyskinesia, without mention of fluctuations: Secondary | ICD-10-CM | POA: Diagnosis not present

## 2023-04-11 DIAGNOSIS — M1712 Unilateral primary osteoarthritis, left knee: Secondary | ICD-10-CM | POA: Diagnosis not present

## 2023-04-11 DIAGNOSIS — Z01818 Encounter for other preprocedural examination: Secondary | ICD-10-CM | POA: Diagnosis present

## 2023-04-11 HISTORY — DX: Bell's palsy: G51.0

## 2023-04-11 HISTORY — DX: Unspecified osteoarthritis, unspecified site: M19.90

## 2023-04-11 HISTORY — DX: Anemia, unspecified: D64.9

## 2023-04-11 HISTORY — DX: Other vitreous opacities, unspecified eye: H43.399

## 2023-04-11 HISTORY — DX: Acute myocardial infarction, unspecified: I21.9

## 2023-04-11 LAB — CBC
HCT: 33.7 % — ABNORMAL LOW (ref 39.0–52.0)
Hemoglobin: 10.5 g/dL — ABNORMAL LOW (ref 13.0–17.0)
MCH: 28.6 pg (ref 26.0–34.0)
MCHC: 31.2 g/dL (ref 30.0–36.0)
MCV: 91.8 fL (ref 80.0–100.0)
Platelets: 194 10*3/uL (ref 150–400)
RBC: 3.67 MIL/uL — ABNORMAL LOW (ref 4.22–5.81)
RDW: 13.2 % (ref 11.5–15.5)
WBC: 3.6 10*3/uL — ABNORMAL LOW (ref 4.0–10.5)
nRBC: 0 % (ref 0.0–0.2)

## 2023-04-11 LAB — COMPREHENSIVE METABOLIC PANEL
ALT: 6 U/L (ref 0–44)
AST: 18 U/L (ref 15–41)
Albumin: 3.8 g/dL (ref 3.5–5.0)
Alkaline Phosphatase: 57 U/L (ref 38–126)
Anion gap: 7 (ref 5–15)
BUN: 24 mg/dL — ABNORMAL HIGH (ref 8–23)
CO2: 23 mmol/L (ref 22–32)
Calcium: 8.6 mg/dL — ABNORMAL LOW (ref 8.9–10.3)
Chloride: 107 mmol/L (ref 98–111)
Creatinine, Ser: 1.16 mg/dL (ref 0.61–1.24)
GFR, Estimated: >60 mL/min (ref >60)
Glucose, Bld: 91 mg/dL (ref 70–99)
Potassium: 4.6 mmol/L (ref 3.5–5.1)
Sodium: 137 mmol/L (ref 135–145)
Total Bilirubin: 0.5 mg/dL (ref 0.3–1.2)
Total Protein: 7 g/dL (ref 6.5–8.1)

## 2023-04-11 LAB — SURGICAL PCR SCREEN
MRSA, PCR: NEGATIVE
Staphylococcus aureus: NEGATIVE

## 2023-04-11 LAB — HEMOGLOBIN A1C
Hgb A1c MFr Bld: 6 % — ABNORMAL HIGH (ref 4.8–5.6)
Mean Plasma Glucose: 125.5 mg/dL

## 2023-04-11 LAB — GLUCOSE, CAPILLARY: Glucose-Capillary: 87 mg/dL (ref 70–99)

## 2023-04-12 ENCOUNTER — Ambulatory Visit: Payer: Medicare HMO | Admitting: Physical Therapy

## 2023-04-12 ENCOUNTER — Encounter: Payer: Self-pay | Admitting: Physical Therapy

## 2023-04-12 ENCOUNTER — Encounter (HOSPITAL_BASED_OUTPATIENT_CLINIC_OR_DEPARTMENT_OTHER): Payer: Self-pay | Admitting: Cardiovascular Disease

## 2023-04-12 DIAGNOSIS — R2681 Unsteadiness on feet: Secondary | ICD-10-CM

## 2023-04-12 DIAGNOSIS — R2689 Other abnormalities of gait and mobility: Secondary | ICD-10-CM | POA: Diagnosis not present

## 2023-04-12 DIAGNOSIS — M6281 Muscle weakness (generalized): Secondary | ICD-10-CM

## 2023-04-12 NOTE — Therapy (Signed)
OUTPATIENT PHYSICAL THERAPY NEURO TREATMENT/DISCHARGE SUMMARY   Patient Name: Ronald [name] MRN: [id number] DOB:19-Feb-1951, 72 y.o., male Today's Date: 04/12/2023   PCP: Woodroe Chen, MD REFERRING PROVIDER: Butch Penny, NP    PHYSICAL THERAPY DISCHARGE SUMMARY  Visits from Start of Care: 12  Current functional level related to goals / functional outcomes: Pt has met 4 of 5 LTGs; he has demo improvements in balance, functional strength and gait velocity measures.   Remaining deficits: Balance, strength, abnormal posture/postural instability (all improving)   Education / Equipment: Educated in [employer], community options for PD    Patient agrees to discharge. Patient goals were met. Patient is being discharged due to meeting the stated rehab goals. He is to have scheduled knee surgery 04/21/2023. Recommend return PT eval for Parkinson's in 6-9 months.   END OF SESSION:  PT End of Session - 04/12/23 0934     Visit Number 12    Number of Visits 12    Date for PT Re-Evaluation 04/14/23    Authorization Type [employer]    Authorization Time Period 03/07/2023-04/14/2023   e-stim unattended was added to auth 7/10-7/26   Authorization - Visit Number 12    Authorization - Number of Visits 12    PT Start Time 0937    PT Stop Time 1009    PT Time Calculation (min) 32 min    Equipment Utilized During Treatment Gait belt    Activity Tolerance Patient tolerated treatment well    Behavior During Therapy WFL for tasks assessed/performed                      Past Medical History:  Diagnosis Date   Anemia    Arthritis    Bell's palsy    CAD in native artery 06/04/2020   RCA infarct 2013.  S/p RCA and OM PCI   Diabetes mellitus type 2 in nonobese (HCC) 06/04/2020   Diabetes mellitus without complication (HCC)    Essential hypertension 06/04/2020   Floaters in visual field    Hypertension    Myocardial infarction [facility])    Parkinson's disease    Parkinson's  disease 06/04/2020   Pure hypercholesterolemia 06/04/2020   Past Surgical History:  Procedure Laterality Date   APPENDECTOMY     back epidural   08/2017   BACK SURGERY     CORONARY ANGIOPLASTY WITH STENT PLACEMENT  08/19/2012   KNEE SURGERY     Patient Active Problem List   Diagnosis Date Noted   High risk medication use 10/28/2021   Olecranon bursitis, left elbow 04/21/2021   Personal history of COVID-19 04/13/2021   Primary osteoarthritis, 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   Essential hypertension 06/04/2020   Pure hypercholesterolemia 06/04/2020   CAD in native artery 06/04/2020   Parkinson's disease 06/04/2020   Diabetes mellitus type 2 in nonobese (HCC) 06/04/2020   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   Iron deficiency anemia 11/14/2019   Unilateral primary osteoarthritis, left knee 04/01/2019   Chronic left-sided low back pain with left-sided sciatica 04/01/2019   Lumbar radiculopathy 08/21/2017   Chronic pain syndrome 08/04/2017   Facet arthropathy 08/04/2017   Lumbar paraspinal muscle spasm 08/04/2017   Anemia of chronic disease 07/03/2017   Dizziness 11/10/2016   Hyperlipidemia, unspecified 10/27/2016   Other intervertebral disc degeneration, lumbar region 06/30/2016   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 heart with stable angina pectoris (HCC) 02/26/2016   SOB (shortness of breath) 02/26/2016   Old MI (myocardial infarction) 02/26/2016  OAB (overactive bladder) 09/05/2013   Ischemic heart disease 09/21/2012   Bell's palsy 06/14/2012    ONSET DATE: 03/01/2023 (MD referral)  REFERRING DIAG:  G20.B2 (ICD-10-CM) - Parkinson's disease with dyskinesia and fluctuating manifestations  R29.6 (ICD-10-CM) - Frequent falls    THERAPY DIAG:  Other abnormalities of gait and mobility  Unsteadiness on feet  Muscle weakness (generalized)  Rationale for Evaluation and Treatment: Rehabilitation  SUBJECTIVE:                                                                                                                                                                                              SUBJECTIVE STATEMENT: Moving slowly this morning.  Went for pre-op visit yesterday.   Pt accompanied by: self  PERTINENT HISTORY: CAD, DM, HTN, hx of back surgery; sciatica *(Per wife report, pt to have L TKR in 6-8 weeks due to bone on bone arthritis)  PAIN:  Are you having pain? Yes: NPRS scale: 2-3/10 Pain location: L knee/shoulder Pain description: achy Aggravating factors: standing too long Relieving factors: sitting Standing for any length of time brings on pain in buttocks   PRECAUTIONS: Fall  WEIGHT BEARING RESTRICTIONS: No  FALLS: Has patient fallen in last 6 months? Yes. Number of falls 6  LIVING ENVIRONMENT: Lives with: lives with their spouse Lives in: House/apartment Stairs: Yes: Internal: 6 steps; on right going up and External: 1 steps; none Has following equipment at home: Single point cane  PLOF: Independent with household mobility with device, Independent with community mobility with device, and Leisure: does yoga and does cycling; does stretches for his back  PATIENT GOALS: To get stronger before upcoming knee surgery  OBJECTIVE:     TODAY'S TREATMENT: 04/12/2023 Activity Comments  Berg Balance 38/56 Score improved from 23/56  FTSTS:   Gait velocity   eview HEP updates:  Standing modified quadruped PWR! Moves: PWR! UP x 10 PWR! Rock x 10 PWR! Twist x 5 reps each side  At counter:  performed with good control, supervision and no LOB  Side step Back step        [facility] PT Assessment - 04/12/23 0001       Standardized Balance Assessment   Standardized Balance Assessment Berg Balance Test      Berg Balance Test   Sit to Stand Able to stand without using hands and stabilize independently    Standing Unsupported Able to stand safely 2 minutes    Sitting with Back Unsupported but Feet Supported on Floor or Stool Able  to sit safely and securely 2 minutes    Stand to Sit Sits safely with  minimal use of hands    Transfers Able to transfer safely, definite need of hands    Standing Unsupported with Eyes Closed Able to stand 10 seconds with supervision    Standing Unsupported with Feet Together Able to place feet together independently and stand for 1 minute with supervision    From Standing, Reach Forward with Outstretched Arm Can reach forward >12 cm safely (5")    From Standing Position, Pick up Object from Floor Able to pick up shoe, needs supervision    From Standing Position, Turn to Look Behind Over each Shoulder Looks behind one side only/other side shows less weight shift    Turn 360 Degrees Able to turn 360 degrees safely but slowly   11.65 sec   Standing Unsupported, Alternately Place Feet on Step/Stool Needs assistance to keep from falling or unable to try    Standing Unsupported, One Foot in Front Able to take small step independently and hold 30 seconds    Standing on One Leg Unable to try or needs assist to prevent fall    Total Score 38    Berg comment: Improved from 23/56                   PATIENT EDUCATION: Education details: Progress towards goals, POC and plans for d/c from PT.  Discussed return PT (PD) eval in 6-9 months Person educated: Patient Education method: Explanation, Demonstration, Tactile cues, and Verbal cues Education comprehension: verbalized understanding and returned demonstration    Access Code: [email or web address] URL: [email or web address] Date: 03/24/2023>04/05/2023 Prepared by: [facility] - Outpatient  Rehab - [facility]  Exercises - Standing Anterior Posterior Weight Shift  - 1 x daily - 5 x weekly - 2 sets - 10 reps - Seated Ankle Dorsiflexion with Resistance  - 1 x daily - 5 x weekly - 2-3 sets - 10 reps - 3 sec hold - Seated Heel Raise  - 1 x daily - 5 x weekly - 2-3 sets - 10 reps - 3 sec hold - Seated Long Arc Quad  - 1 x daily - 5 x  weekly - 2-3 sets - 10 reps - Seated Hamstring Curl with Anchored Resistance  - 1 x daily - 5 x weekly - 3 sets - 10 reps *Added 04/05/2023 Handouts for Modified quadruped standing PWR! Moves-PWR! Up, PWR! [place], [place]! Twist    ---------------------------------------------------- Objective information below taken at initial evaluation:   DIAGNOSTIC FINDINGS: NA for this episode  COGNITION: Overall cognitive status: Within functional limits for tasks assessed and slowed response time for questions, but answers WFL   SENSATION: Light touch: WFL  COORDINATION: Slowed BLE movement patterns   POSTURE: rounded shoulders, flexed trunk , and weight shift right  LOWER EXTREMITY ROM:   A/ROM WFL grossly assessed in sitting  LOWER EXTREMITY MMT:    MMT Right Eval Left Eval  Hip flexion 4 5  Hip extension    Hip abduction    Hip adduction    Hip internal rotation    Hip external rotation    Knee flexion 4 5  Knee extension 4 5  Ankle dorsiflexion 3+ 3+  Ankle plantarflexion 3- 3-  Ankle inversion    Ankle eversion    (Blank rows = not tested)    TRANSFERS: Assistive device utilized: None  Sit to stand: SBA and with BUE support Stand to sit: SBA and with BUE support GAIT: Gait pattern: step through pattern, decreased step length- Right, decreased step length- Left, decreased  ankle dorsiflexion- Left, lateral lean- Right, trunk rotated posterior- Right, trunk flexed, and narrow BOS Distance walked: 40 ft Assistive device utilized: Single point cane Level of assistance: SBA and CGA Comments: Unstable, slowed gait pattern  FUNCTIONAL TESTS:  5 times sit to stand: 26.06 with UE support Timed up and go (TUG): NT 10 meter walk test: NT Berg Balance test:  23/56 *En bloc turn noted; with squat to floor, pt has difficulty with LOB getting up and needs assist to return safety to stand   [facility] PT Assessment - 04/12/23 0001       Standardized Balance Assessment   Standardized  Balance Assessment Berg Balance Test      Berg Balance Test   Sit to Stand Able to stand without using hands and stabilize independently    Standing Unsupported Able to stand safely 2 minutes    Sitting with Back Unsupported but Feet Supported on Floor or Stool Able to sit safely and securely 2 minutes    Stand to Sit Sits safely with minimal use of hands    Transfers Able to transfer safely, definite need of hands    Standing Unsupported with Eyes Closed Able to stand 10 seconds with supervision    Standing Unsupported with Feet Together Able to place feet together independently and stand for 1 minute with supervision    From Standing, Reach Forward with Outstretched Arm Can reach forward >12 cm safely (5")    From Standing Position, Pick up Object from Floor Able to pick up shoe, needs supervision    From Standing Position, Turn to Look Behind Over each Shoulder Looks behind one side only/other side shows less weight shift    Turn 360 Degrees Able to turn 360 degrees safely but slowly   11.65 sec   Standing Unsupported, Alternately Place Feet on Step/Stool Needs assistance to keep from falling or unable to try    Standing Unsupported, One Foot in Front Able to take small step independently and hold 30 seconds    Standing on One Leg Unable to try or needs assist to prevent fall    Total Score 38    Berg comment: Improved from 23/56               TODAY'S TREATMENT:                                                                                                                              DATE: 03/07/2023    PATIENT EDUCATION: Education details: PT eval results, POC Person educated: Patient and Spouse Education method: Explanation Education comprehension: verbalized understanding  HOME EXERCISE PROGRAM: Not yet initiated  GOALS: Goals reviewed with patient? Yes  SHORT TERM GOALS: Target date: 03/31/2023  Pt will be supervision with HEP for improved strength, transfers,  balance, gait. Baseline: Goal status: MET 03/27/23  2.  Pt will improve 5x sit<>stand to less than or equal to 20 sec to demonstrate improved functional  strength and transfer efficiency. Baseline: 26.06 sec; 15.96 sec 03/27/23 Goal status: MET 03/27/23  3.  Pt will verbalize understanding of fall prevention in home environment.   Baseline:  6 falls in 6 months  Goal status:  MET 03/13/23   LONG TERM GOALS: Target date: 04/14/2023  Pt will be supervision with HEP for improved strength, transfers, balance, gait. Baseline:  Goal status: MET  2.  Pt will improve 5x sit<>stand to less than or equal to 15 sec to demonstrate improved functional strength and transfer efficiency. Baseline: 26.06 sec; 15.96 sec 03/27/23>19.35 sec no UE support Goal status: PARTIALLY MET, 04/12/2023  3.  Pt will improve Berg score to at least 32/56 to decrease fall risk. Baseline: 23/56>38/56 04/12/23 Goal status: MET 04/12/2023  4.  Gait velocity to be assessed and goal to be set as appropriate. (Imrpove to at least 2 ft/sec for improved safety with gait) Baseline: 1.66 ft/sec 03/10/2023>2.19 ft/sec 04/12/23 Goal status: MET  5.  Pt will verbalize understanding of local Parkinson's disease community resources.  Baseline: verbalizes understanding Goal status: MET, 04/12/2023  ASSESSMENT:  CLINICAL IMPRESSION: Assessed LTGs this visit, with pt meeting 4 of 5 LTGs.  He has demonstrated improvement in [place], [place] and gait velocity scores since eval.  He remains at fall risk and is aware to either use cane or walker for improved stability.  He demo understanding of HEP, and he does a better job with posterior control of balance with exercises today.  He is appropriate for discharge from PT this visit. OBJECTIVE IMPAIRMENTS: Abnormal gait, decreased balance, decreased mobility, difficulty walking, decreased strength, and postural dysfunction.   ACTIVITY LIMITATIONS: standing, squatting, stairs, transfers, and locomotion  level  PARTICIPATION LIMITATIONS: community activity  PERSONAL FACTORS: 3+ comorbidities: see PMH above  are also affecting patient's functional outcome.   REHAB POTENTIAL: Good  CLINICAL DECISION MAKING: Evolving/moderate complexity  EVALUATION COMPLEXITY: Moderate  PLAN:  PT FREQUENCY: 1-2x/week  PT DURATION: 6 weeks  PLANNED INTERVENTIONS: Therapeutic exercises, Therapeutic activity, Neuromuscular re-education, Balance training, Gait training, Patient/Family education, Self Care, Stair training, and DME instructions  PLAN FOR NEXT SESSION:  Discharge PT this visit.  Plan for return PT eval in 6-9 months to address Parkinson's specific issues.  Lonia Blood, PT 04/12/23 10:18 AM Phone: [phone] Fax: [phone]   [facility] at [facility] [address] [place], [address] [place], [place] [address] Phone # [phone] Fax # [phone]

## 2023-04-13 NOTE — Progress Notes (Signed)
Anesthesia Chart Review   Case: [id number] Date/Time: 04/21/23 0930   Procedure: LEFT TOTAL KNEE ARTHROPLASTY (Left: Knee)   Anesthesia type: Spinal   Pre-op diagnosis: OSTEOARTHRITIS LEFT KNEE   Location: [facility] ROOM 09 / [facility]   Surgeons: Kathryne Hitch, MD       DISCUSSION:72 y.o. former smoker with h/o Parkinson's disease, HTN, DM II, CAD s/p PCI, left knee OA scheduled for above procedure 04/21/23 with Dr. Doneen Poisson.   Seen by cardiology 10/31/22. Per OV note, "Mr. Westra's perioperative risk of a major cardiac event is 0.9% according to the Revised Cardiac Risk Index (RCRI).  Therefore, he is at low risk for perioperative complications.   His functional capacity is good at 5.07 METs according to the Duke Activity Status Index (DASI). Recommendations: Stable echo. Okay to proceed without further testing.  Antiplatelet and/or Anticoagulation Recommendations: The patient should remain on Aspirin without interruption.  "  VS: BP 139/85   Pulse 62   Temp 36.7 C (Oral)   Resp 14   Ht 5\' 8"  (1.727 m)   Wt 79.7 kg   SpO2 100%   BMI 26.70 kg/m   PROVIDERS: Woodroe Chen, MD is PCP   Chilton Si, MD is Cardiologist  LABS: Labs reviewed: Acceptable for surgery. (all labs ordered are listed, but only abnormal results are displayed)  Labs Reviewed  CBC - Abnormal; Notable for the following components:      Result Value   WBC 3.6 (*)    RBC 3.67 (*)    Hemoglobin 10.5 (*)    HCT 33.7 (*)    All other components within normal limits  COMPREHENSIVE METABOLIC PANEL - Abnormal; Notable for the following components:   BUN 24 (*)    Calcium 8.6 (*)    All other components within normal limits  HEMOGLOBIN A1C - Abnormal; Notable for the following components:   Hgb A1c MFr Bld 6.0 (*)    All other components within normal limits  SURGICAL PCR SCREEN  GLUCOSE, CAPILLARY     IMAGES:   EKG:   CV: Echo 11/03/2022 1. Left ventricular ejection  fraction, by estimation, is 60 to 65%. The  left ventricle has normal function. The left ventricle has no regional  wall motion abnormalities. Left ventricular diastolic parameters are  indeterminate.   2. Right ventricular systolic function is normal. The right ventricular  size is normal.   3. The mitral valve is normal in structure. Mild mitral valve  regurgitation. No evidence of mitral stenosis.   4. The aortic valve is normal in structure. Aortic valve regurgitation is  not visualized. No aortic stenosis is present.   5. The inferior vena cava is normal in size with greater than 50%  respiratory variability, suggesting right atrial pressure of 3 mmHg.  Past Medical History:  Diagnosis Date   Anemia    Arthritis    Bell's palsy    CAD in native artery 06/04/2020   RCA infarct 2013.  S/p RCA and OM PCI   Diabetes mellitus type 2 in nonobese (HCC) 06/04/2020   Diabetes mellitus without complication (HCC)    Essential hypertension 06/04/2020   Floaters in visual field    Hypertension    Myocardial infarction [facility])    Parkinson's disease    Parkinson's disease 06/04/2020   Pure hypercholesterolemia 06/04/2020    Past Surgical History:  Procedure Laterality Date   APPENDECTOMY     back epidural   08/2017   BACK SURGERY  CORONARY ANGIOPLASTY WITH STENT PLACEMENT  08/19/2012   KNEE SURGERY      MEDICATIONS:  aspirin 81 MG chewable tablet   carbidopa-levodopa (SINEMET IR) 25-100 MG tablet   furosemide (LASIX) 20 MG tablet   lisinopril (ZESTRIL) 5 MG tablet   metoprolol succinate (TOPROL-XL) 25 MG 24 hr tablet   midodrine (PROAMATINE) 2.5 MG tablet   naproxen sodium (ALEVE) 220 MG tablet   rosuvastatin (CRESTOR) 20 MG tablet   sertraline (ZOLOFT) 50 MG tablet   No current facility-administered medications for this encounter.     Jodell Cipro Ward, PA-C [facility] Pre-Surgical Testing [phone]

## 2023-04-13 NOTE — Anesthesia Preprocedure Evaluation (Addendum)
Anesthesia Evaluation  Patient identified by MRN, date of birth, ID band Patient awake    Reviewed: Allergy & Precautions, NPO status , Patient's Chart, lab work & pertinent test results, reviewed documented beta blocker date and time   History of Anesthesia Complications Negative for: history of anesthetic complications  Airway Mallampati: II  TM Distance: >3 FB Neck ROM: Full    Dental  (+) Missing, Dental Advisory Given   Pulmonary former smoker   breath sounds clear to auscultation       Cardiovascular hypertension, Pt. on medications and Pt. on home beta blockers (-) angina + CAD (s/p PCI), + Past MI and + Cardiac Stents   Rhythm:Regular Rate:Normal  10/2022 ECHO: EF 60-65%, normal LVF, normal RVF, mild MR   Neuro/Psych    Depression    Parkinson's H/o Bell's    GI/Hepatic negative GI ROS, Neg liver ROS,,,  Endo/Other  diabetes (diet controlled, glu 81)    Renal/GU negative Renal ROS     Musculoskeletal  (+) Arthritis ,    Abdominal   Peds  Hematology  (+) Blood dyscrasia (Hb 10.5, plt 194k), anemia   Anesthesia Other Findings   Reproductive/Obstetrics                             Anesthesia Physical Anesthesia Plan  ASA: 3  Anesthesia Plan: Spinal   Post-op Pain Management: Regional block* and Tylenol PO (pre-op)*   Induction:   PONV Risk Score and Plan: 1 and Treatment may vary due to age or medical condition and Ondansetron  Airway Management Planned: Natural Airway and Simple Face Mask  Additional Equipment: None  Intra-op Plan:   Post-operative Plan:   Informed Consent: I have reviewed the patients History and Physical, chart, labs and discussed the procedure including the risks, benefits and alternatives for the proposed anesthesia with the patient or authorized representative who has indicated his/her understanding and acceptance.     Dental advisory  given  Plan Discussed with: CRNA and Surgeon  Anesthesia Plan Comments: (See PAT note 04/11/23 Plan routine monitors, SAB with adductor canal block for post op analgesia)       Anesthesia Quick Evaluation

## 2023-04-20 NOTE — H&P (Signed)
TOTAL KNEE ADMISSION H&P  Patient is being admitted for left total knee arthroplasty.  Subjective:  Chief Complaint:left knee pain.  HPI: Ronald [name], 72 y.o. male, has a history of pain and functional disability in the left knee due to arthritis and has failed non-surgical conservative treatments for greater than 12 weeks to includeNSAID's and/or analgesics, corticosteriod injections, viscosupplementation injections, use of assistive devices, and activity modification.  Onset of symptoms was gradual, starting 5 years ago with gradually worsening course since that time. The patient noted prior procedures on the knee to include  arthroscopy on the left knee(s).  Patient currently rates pain in the left knee(s) at 10 out of 10 with activity. Patient has night pain, worsening of pain with activity and weight bearing, pain that interferes with activities of daily living, pain with passive range of motion, crepitus, and joint swelling.  Patient has evidence of subchondral sclerosis, periarticular osteophytes, and joint space narrowing by imaging studies. There is no active infection.  Patient Active Problem List   Diagnosis Date Noted   High risk medication use 10/28/2021   Olecranon bursitis, left elbow 04/21/2021   Personal history of COVID-19 04/13/2021   Primary osteoarthritis, 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   Essential hypertension 06/04/2020   Pure hypercholesterolemia 06/04/2020   CAD in native artery 06/04/2020   Parkinson's disease 06/04/2020   Diabetes mellitus type 2 in nonobese (HCC) 06/04/2020   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   Iron deficiency anemia 11/14/2019   Unilateral primary osteoarthritis, left knee 04/01/2019   Chronic left-sided low back pain with left-sided sciatica 04/01/2019   Lumbar radiculopathy 08/21/2017   Chronic pain syndrome 08/04/2017   Facet arthropathy 08/04/2017   Lumbar paraspinal muscle spasm 08/04/2017   Anemia of chronic disease 07/03/2017    Dizziness 11/10/2016   Hyperlipidemia, unspecified 10/27/2016   Other intervertebral disc degeneration, lumbar region 06/30/2016   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 heart with stable angina pectoris (HCC) 02/26/2016   SOB (shortness of breath) 02/26/2016   Old MI (myocardial infarction) 02/26/2016   OAB (overactive bladder) 09/05/2013   Ischemic heart disease 09/21/2012   Bell's palsy 06/14/2012   Past Medical History:  Diagnosis Date   Anemia    Arthritis    Bell's palsy    CAD in native artery 06/04/2020   RCA infarct 2013.  S/p RCA and OM PCI   Diabetes mellitus type 2 in nonobese (HCC) 06/04/2020   Diabetes mellitus without complication (HCC)    Essential hypertension 06/04/2020   Floaters in visual field    Hypertension    Myocardial infarction [facility])    Parkinson's disease    Parkinson's disease 06/04/2020   Pure hypercholesterolemia 06/04/2020    Past Surgical History:  Procedure Laterality Date   APPENDECTOMY     back epidural   08/2017   BACK SURGERY     CORONARY ANGIOPLASTY WITH STENT PLACEMENT  08/19/2012   KNEE SURGERY      No current facility-administered medications for this encounter.   Current Outpatient Medications  Medication Sig Dispense Refill Last Dose   aspirin 81 MG chewable tablet Chew 81 mg by mouth daily.      carbidopa-levodopa (SINEMET IR) 25-100 MG tablet Take by mouth 2 pills at 9 AM, 1 pill at 12, 2 pills at 3 PM, 1 pill at 6 PM, and 2 pills at 9 PM daily. 720 tablet 0    lisinopril (ZESTRIL) 5 MG tablet Take 1 tablet by mouth once daily 90 tablet  1    metoprolol succinate (TOPROL-XL) 25 MG 24 hr tablet Take 25 mg by mouth daily.      naproxen sodium (ALEVE) 220 MG tablet Take 220 mg by mouth 2 (two) times daily as needed (pain).      rosuvastatin (CRESTOR) 20 MG tablet Take 1 tablet by mouth once daily 90 tablet 2    sertraline (ZOLOFT) 50 MG tablet Take 50 mg by mouth daily at 6 PM.      furosemide (LASIX) 20 MG tablet  Take 1 tablet (20 mg total) by mouth daily as needed (for lower extremity edema or weight gain of 2 lbs or more overnight or 5 lbs or more in 1 week). (Patient not taking: Reported on 03/08/2023) 45 tablet 3 Not Taking   midodrine (PROAMATINE) 2.5 MG tablet Take 1 tablet by mouth daily in the morning to help with low BP (Patient not taking: Reported on 03/08/2023) 30 tablet 1 Not Taking   Allergies  Allergen Reactions   Sulfamethoxazole-Trimethoprim Swelling    Pt stated his lips became swollen    Social History   Tobacco Use   Smoking status: Former   Smokeless tobacco: Never  Substance Use Topics   Alcohol use: Yes    Comment: occ glass of wine for a special occasion    Family History  Problem Relation Age of Onset   Diabetes Mother    Sarcoidosis Mother    Stroke Father    Parkinson's disease Neg Hx      Review of Systems  Objective:  Physical Exam Vitals reviewed.  Constitutional:      Appearance: Normal appearance. He is normal weight.  HENT:     Head: Normocephalic and atraumatic.  Eyes:     Extraocular Movements: Extraocular movements intact.     Pupils: Pupils are equal, round, and reactive to light.  Cardiovascular:     Rate and Rhythm: Normal rate and regular rhythm.  Pulmonary:     Effort: Pulmonary effort is normal.     Breath sounds: Normal breath sounds.  Abdominal:     Palpations: Abdomen is soft.  Musculoskeletal:     Cervical back: Normal range of motion and neck supple.     Left knee: Effusion, bony tenderness and crepitus present. Decreased range of motion. Tenderness present over the medial joint line, lateral joint line and patellar tendon. Abnormal alignment.  Neurological:     Mental Status: He is alert and oriented to person, place, and time.  Psychiatric:        Behavior: Behavior normal.     Vital signs in last 24 hours:    Labs:   Estimated body mass index is 26.7 kg/m as calculated from the following:   Height as of 04/11/23: 5'  8" (1.727 m).   Weight as of 04/11/23: 79.7 kg.   Imaging Review Plain radiographs demonstrate severe degenerative joint disease of the left knee(s). The overall alignment ismild varus. The bone quality appears to be excellent for age and reported activity level.      Assessment/Plan:  End stage arthritis, left knee   The patient history, physical examination, clinical judgment of the provider and imaging studies are consistent with end stage degenerative joint disease of the left knee(s) and total knee arthroplasty is deemed medically necessary. The treatment options including medical management, injection therapy arthroscopy and arthroplasty were discussed at length. The risks and benefits of total knee arthroplasty were presented and reviewed. The risks due to aseptic loosening, infection, stiffness,  patella tracking problems, thromboembolic complications and other imponderables were discussed. The patient acknowledged the explanation, agreed to proceed with the plan and consent was signed. Patient is being admitted for inpatient treatment for surgery, pain control, PT, OT, prophylactic antibiotics, VTE prophylaxis, progressive ambulation and ADL's and discharge planning. The patient is planning to be discharged home with home health services

## 2023-04-21 ENCOUNTER — Other Ambulatory Visit: Payer: Self-pay

## 2023-04-21 ENCOUNTER — Encounter (HOSPITAL_COMMUNITY)
Admission: RE | Disposition: A | Discharge status: Home Health | Payer: Self-pay | Source: Home / Self Care | Attending: Orthopaedic Surgery

## 2023-04-21 ENCOUNTER — Ambulatory Visit (HOSPITAL_COMMUNITY): Payer: Medicare HMO | Admitting: Physician Assistant

## 2023-04-21 ENCOUNTER — Observation Stay (HOSPITAL_COMMUNITY): Payer: Medicare HMO

## 2023-04-21 ENCOUNTER — Ambulatory Visit (HOSPITAL_BASED_OUTPATIENT_CLINIC_OR_DEPARTMENT_OTHER): Payer: Medicare HMO | Admitting: Registered Nurse

## 2023-04-21 ENCOUNTER — Encounter (HOSPITAL_COMMUNITY): Payer: Self-pay | Admitting: Orthopaedic Surgery

## 2023-04-21 ENCOUNTER — Inpatient Hospital Stay (HOSPITAL_COMMUNITY)
Admission: RE | Admit: 2023-04-21 | Discharge: 2023-04-24 | DRG: 470 | Disposition: A | Discharge status: Home Health | Payer: Medicare HMO | Attending: Orthopaedic Surgery | Admitting: Orthopaedic Surgery

## 2023-04-21 DIAGNOSIS — M19012 Primary osteoarthritis, left shoulder: Secondary | ICD-10-CM | POA: Diagnosis present

## 2023-04-21 DIAGNOSIS — G20A1 Parkinson's disease without dyskinesia, without mention of fluctuations: Secondary | ICD-10-CM | POA: Diagnosis present

## 2023-04-21 DIAGNOSIS — G510 Bell's palsy: Secondary | ICD-10-CM | POA: Diagnosis present

## 2023-04-21 DIAGNOSIS — M1712 Unilateral primary osteoarthritis, left knee: Principal | ICD-10-CM | POA: Diagnosis present

## 2023-04-21 DIAGNOSIS — I252 Old myocardial infarction: Secondary | ICD-10-CM

## 2023-04-21 DIAGNOSIS — Z833 Family history of diabetes mellitus: Secondary | ICD-10-CM

## 2023-04-21 DIAGNOSIS — I2510 Atherosclerotic heart disease of native coronary artery without angina pectoris: Secondary | ICD-10-CM

## 2023-04-21 DIAGNOSIS — G894 Chronic pain syndrome: Secondary | ICD-10-CM | POA: Diagnosis present

## 2023-04-21 DIAGNOSIS — Z882 Allergy status to sulfonamides status: Secondary | ICD-10-CM

## 2023-04-21 DIAGNOSIS — I10 Essential (primary) hypertension: Secondary | ICD-10-CM | POA: Diagnosis not present

## 2023-04-21 DIAGNOSIS — Z96652 Presence of left artificial knee joint: Secondary | ICD-10-CM

## 2023-04-21 DIAGNOSIS — Z8616 Personal history of COVID-19: Secondary | ICD-10-CM

## 2023-04-21 DIAGNOSIS — Z955 Presence of coronary angioplasty implant and graft: Secondary | ICD-10-CM

## 2023-04-21 DIAGNOSIS — E7800 Pure hypercholesterolemia, unspecified: Secondary | ICD-10-CM | POA: Diagnosis present

## 2023-04-21 DIAGNOSIS — Z7982 Long term (current) use of aspirin: Secondary | ICD-10-CM

## 2023-04-21 DIAGNOSIS — Z87891 Personal history of nicotine dependence: Secondary | ICD-10-CM | POA: Diagnosis not present

## 2023-04-21 DIAGNOSIS — Z823 Family history of stroke: Secondary | ICD-10-CM

## 2023-04-21 DIAGNOSIS — N3281 Overactive bladder: Secondary | ICD-10-CM | POA: Diagnosis present

## 2023-04-21 DIAGNOSIS — E119 Type 2 diabetes mellitus without complications: Secondary | ICD-10-CM | POA: Diagnosis present

## 2023-04-21 DIAGNOSIS — Z79899 Other long term (current) drug therapy: Secondary | ICD-10-CM

## 2023-04-21 HISTORY — PX: TOTAL KNEE ARTHROPLASTY: SHX125

## 2023-04-21 LAB — GLUCOSE, CAPILLARY
Glucose-Capillary: 81 mg/dL (ref 70–99)
Glucose-Capillary: 80 mg/dL (ref 70–99)
Glucose-Capillary: 79 mg/dL (ref 70–99)
Glucose-Capillary: 100 mg/dL — ABNORMAL HIGH (ref 70–99)
Glucose-Capillary: 121 mg/dL — ABNORMAL HIGH (ref 70–99)

## 2023-04-21 SURGERY — ARTHROPLASTY, KNEE, TOTAL
Anesthesia: Spinal | Site: Knee | Laterality: Left

## 2023-04-21 MED ORDER — DIPHENHYDRAMINE HCL 12.5 MG/5ML PO ELIX: 12.5000 mg | ORAL_SOLUTION | ORAL | Status: DC | PRN

## 2023-04-21 MED ORDER — PHENOL 1.4 % MT LIQD: 1.0000 | OROMUCOSAL | Status: DC | PRN

## 2023-04-21 MED ORDER — SODIUM CHLORIDE 0.9 % IR SOLN
Status: DC | PRN
  Administered 2023-04-21: 1000 mL

## 2023-04-21 MED ORDER — TRANEXAMIC ACID-NACL 1000-0.7 MG/100ML-% IV SOLN
1000.0000 mg | INTRAVENOUS | Status: AC
  Administered 2023-04-21: 1000 mg via INTRAVENOUS
  Filled 2023-04-21: qty 100

## 2023-04-21 MED ORDER — ORAL CARE MOUTH RINSE: 15.0000 mL | Freq: Once | OROMUCOSAL | Status: AC

## 2023-04-21 MED ORDER — ALUM & MAG HYDROXIDE-SIMETH 200-200-20 MG/5ML PO SUSP: 30.0000 mL | ORAL | Status: DC | PRN

## 2023-04-21 MED ORDER — ONDANSETRON HCL 4 MG/2ML IJ SOLN
INTRAMUSCULAR | Status: AC
  Filled 2023-04-21: qty 2

## 2023-04-21 MED ORDER — SODIUM CHLORIDE 0.9 % IV SOLN: INTRAVENOUS | Status: DC

## 2023-04-21 MED ORDER — EPHEDRINE SULFATE-NACL 50-0.9 MG/10ML-% IV SOSY
PREFILLED_SYRINGE | INTRAVENOUS | Status: DC | PRN
  Administered 2023-04-21: 5 mg via INTRAVENOUS

## 2023-04-21 MED ORDER — PROPOFOL 1000 MG/100ML IV EMUL
INTRAVENOUS | Status: AC
  Filled 2023-04-21: qty 100

## 2023-04-21 MED ORDER — METHOCARBAMOL 1000 MG/10ML IJ SOLN: 500.0000 mg | Freq: Four times a day (QID) | INTRAVENOUS | Status: DC | PRN

## 2023-04-21 MED ORDER — ASPIRIN 81 MG PO CHEW
81.0000 mg | CHEWABLE_TABLET | Freq: Two times a day (BID) | ORAL | Status: DC
  Administered 2023-04-21 – 2023-04-24 (×6): 81 mg via ORAL
  Filled 2023-04-21 (×6): qty 1

## 2023-04-21 MED ORDER — PROMETHAZINE HCL 25 MG/ML IJ SOLN
6.2500 mg | INTRAMUSCULAR | Status: DC | PRN
  Administered 2023-04-21: 6.25 mg via INTRAVENOUS

## 2023-04-21 MED ORDER — LISINOPRIL 5 MG PO TABS
5.0000 mg | ORAL_TABLET | Freq: Every day | ORAL | Status: DC
  Administered 2023-04-22 – 2023-04-24 (×3): 5 mg via ORAL
  Filled 2023-04-21 (×3): qty 1

## 2023-04-21 MED ORDER — ACETAMINOPHEN 325 MG PO TABS: 325.0000 mg | ORAL_TABLET | Freq: Four times a day (QID) | ORAL | Status: DC | PRN

## 2023-04-21 MED ORDER — BUPIVACAINE IN DEXTROSE 0.75-8.25 % IT SOLN
INTRATHECAL | Status: DC | PRN
  Administered 2023-04-21: 12 mg via INTRATHECAL

## 2023-04-21 MED ORDER — LACTATED RINGERS IV SOLN: INTRAVENOUS | Status: DC

## 2023-04-21 MED ORDER — POVIDONE-IODINE 10 % EX SWAB
2.0000 | Freq: Once | CUTANEOUS | Status: AC
  Administered 2023-04-21: 2 via TOPICAL

## 2023-04-21 MED ORDER — METOPROLOL SUCCINATE ER 25 MG PO TB24
25.0000 mg | ORAL_TABLET | Freq: Every day | ORAL | Status: DC
  Administered 2023-04-23 – 2023-04-24 (×2): 25 mg via ORAL
  Filled 2023-04-21 (×3): qty 1

## 2023-04-21 MED ORDER — CARBIDOPA-LEVODOPA 25-100 MG PO TABS
2.0000 | ORAL_TABLET | ORAL | Status: DC
  Administered 2023-04-21 – 2023-04-24 (×9): 2 via ORAL
  Filled 2023-04-21 (×9): qty 2

## 2023-04-21 MED ORDER — CEFAZOLIN SODIUM-DEXTROSE 2-4 GM/100ML-% IV SOLN
2.0000 g | INTRAVENOUS | Status: AC
  Administered 2023-04-21: 2 g via INTRAVENOUS
  Filled 2023-04-21: qty 100

## 2023-04-21 MED ORDER — ROPIVACAINE HCL 7.5 MG/ML IJ SOLN
INTRAMUSCULAR | Status: DC | PRN
Start: 2023-04-21 — End: 2023-04-21
  Administered 2023-04-21: 20 mL via PERINEURAL

## 2023-04-21 MED ORDER — CARBIDOPA-LEVODOPA 25-100 MG PO TABS: 1.0000 | ORAL_TABLET | Freq: Four times a day (QID) | ORAL | Status: DC

## 2023-04-21 MED ORDER — METOCLOPRAMIDE HCL 5 MG/ML IJ SOLN: 5.0000 mg | Freq: Three times a day (TID) | INTRAMUSCULAR | Status: DC | PRN

## 2023-04-21 MED ORDER — 0.9 % SODIUM CHLORIDE (POUR BTL) OPTIME
TOPICAL | Status: DC | PRN
  Administered 2023-04-21: 1000 mL

## 2023-04-21 MED ORDER — FENTANYL CITRATE PF 50 MCG/ML IJ SOSY
50.0000 ug | PREFILLED_SYRINGE | Freq: Once | INTRAMUSCULAR | Status: AC
  Administered 2023-04-21: 50 ug via INTRAVENOUS
  Filled 2023-04-21: qty 2

## 2023-04-21 MED ORDER — ACETAMINOPHEN 500 MG PO TABS
1000.0000 mg | ORAL_TABLET | Freq: Once | ORAL | Status: AC
  Administered 2023-04-21: 1000 mg via ORAL
  Filled 2023-04-21: qty 2

## 2023-04-21 MED ORDER — PROMETHAZINE HCL 25 MG/ML IJ SOLN
INTRAMUSCULAR | Status: AC
  Filled 2023-04-21: qty 1

## 2023-04-21 MED ORDER — HYDROMORPHONE HCL 1 MG/ML IJ SOLN: 0.5000 mg | INTRAMUSCULAR | Status: DC | PRN

## 2023-04-21 MED ORDER — PROPOFOL 500 MG/50ML IV EMUL
INTRAVENOUS | Status: DC | PRN
  Administered 2023-04-21: 40 ug/kg/min via INTRAVENOUS

## 2023-04-21 MED ORDER — ONDANSETRON HCL 4 MG/2ML IJ SOLN
INTRAMUSCULAR | Status: DC | PRN
  Administered 2023-04-21: 4 mg via INTRAVENOUS

## 2023-04-21 MED ORDER — DEXAMETHASONE SODIUM PHOSPHATE 10 MG/ML IJ SOLN
INTRAMUSCULAR | Status: AC
  Filled 2023-04-21: qty 1

## 2023-04-21 MED ORDER — CARBIDOPA-LEVODOPA 25-100 MG PO TABS
1.0000 | ORAL_TABLET | ORAL | Status: DC
  Administered 2023-04-21 – 2023-04-24 (×6): 1 via ORAL
  Filled 2023-04-21 (×6): qty 1

## 2023-04-21 MED ORDER — METHOCARBAMOL 500 MG PO TABS
500.0000 mg | ORAL_TABLET | Freq: Four times a day (QID) | ORAL | Status: DC | PRN
  Administered 2023-04-22 – 2023-04-23 (×2): 500 mg via ORAL
  Filled 2023-04-21 (×2): qty 1

## 2023-04-21 MED ORDER — PANTOPRAZOLE SODIUM 40 MG PO TBEC
40.0000 mg | DELAYED_RELEASE_TABLET | Freq: Every day | ORAL | Status: DC
  Administered 2023-04-21 – 2023-04-24 (×4): 40 mg via ORAL
  Filled 2023-04-21 (×4): qty 1

## 2023-04-21 MED ORDER — DOCUSATE SODIUM 100 MG PO CAPS
100.0000 mg | ORAL_CAPSULE | Freq: Two times a day (BID) | ORAL | Status: DC
  Administered 2023-04-21 – 2023-04-24 (×6): 100 mg via ORAL
  Filled 2023-04-21 (×6): qty 1

## 2023-04-21 MED ORDER — INSULIN ASPART 100 UNIT/ML IJ SOLN: 0.0000 [IU] | INTRAMUSCULAR | Status: DC | PRN

## 2023-04-21 MED ORDER — BUPIVACAINE-EPINEPHRINE 0.25% -1:200000 IJ SOLN
INTRAMUSCULAR | Status: DC | PRN
  Administered 2023-04-21: 30 mL

## 2023-04-21 MED ORDER — ONDANSETRON HCL 4 MG PO TABS: 4.0000 mg | ORAL_TABLET | Freq: Four times a day (QID) | ORAL | Status: DC | PRN

## 2023-04-21 MED ORDER — OXYCODONE HCL 5 MG PO TABS: 5.0000 mg | ORAL_TABLET | Freq: Once | ORAL | Status: DC | PRN

## 2023-04-21 MED ORDER — SERTRALINE HCL 50 MG PO TABS
50.0000 mg | ORAL_TABLET | Freq: Every day | ORAL | Status: DC
  Administered 2023-04-21 – 2023-04-23 (×3): 50 mg via ORAL
  Filled 2023-04-21 (×3): qty 1

## 2023-04-21 MED ORDER — BUPIVACAINE-EPINEPHRINE 0.25% -1:200000 IJ SOLN
INTRAMUSCULAR | Status: AC
  Filled 2023-04-21: qty 1

## 2023-04-21 MED ORDER — CHLORHEXIDINE GLUCONATE 0.12 % MT SOLN
15.0000 mL | Freq: Once | OROMUCOSAL | Status: AC
  Administered 2023-04-21: 15 mL via OROMUCOSAL

## 2023-04-21 MED ORDER — HYDROMORPHONE HCL 1 MG/ML IJ SOLN: 0.2500 mg | INTRAMUSCULAR | Status: DC | PRN

## 2023-04-21 MED ORDER — MIDAZOLAM HCL 2 MG/2ML IJ SOLN: 0.5000 mg | Freq: Once | INTRAMUSCULAR | Status: DC | PRN

## 2023-04-21 MED ORDER — CEFAZOLIN SODIUM-DEXTROSE 1-4 GM/50ML-% IV SOLN
1.0000 g | Freq: Four times a day (QID) | INTRAVENOUS | Status: AC
  Administered 2023-04-21 (×2): 1 g via INTRAVENOUS
  Filled 2023-04-21 (×2): qty 50

## 2023-04-21 MED ORDER — ROSUVASTATIN CALCIUM 20 MG PO TABS
20.0000 mg | ORAL_TABLET | Freq: Every day | ORAL | Status: DC
  Administered 2023-04-22 – 2023-04-24 (×3): 20 mg via ORAL
  Filled 2023-04-21 (×3): qty 1

## 2023-04-21 MED ORDER — ONDANSETRON HCL 4 MG/2ML IJ SOLN
4.0000 mg | Freq: Four times a day (QID) | INTRAMUSCULAR | Status: DC | PRN
  Administered 2023-04-21: 4 mg via INTRAVENOUS
  Filled 2023-04-21: qty 2

## 2023-04-21 MED ORDER — DEXAMETHASONE SODIUM PHOSPHATE 10 MG/ML IJ SOLN
INTRAMUSCULAR | Status: DC | PRN
Start: 2023-04-21 — End: 2023-04-21
  Administered 2023-04-21: 5 mg via INTRAVENOUS

## 2023-04-21 MED ORDER — MENTHOL 3 MG MT LOZG: 1.0000 | LOZENGE | OROMUCOSAL | Status: DC | PRN

## 2023-04-21 MED ORDER — POLYETHYLENE GLYCOL 3350 17 G PO PACK
17.0000 g | PACK | Freq: Every day | ORAL | Status: DC | PRN
  Administered 2023-04-24: 17 g via ORAL
  Filled 2023-04-21: qty 1

## 2023-04-21 MED ORDER — METOCLOPRAMIDE HCL 5 MG PO TABS: 5.0000 mg | ORAL_TABLET | Freq: Three times a day (TID) | ORAL | Status: DC | PRN

## 2023-04-21 MED ORDER — OXYCODONE HCL 5 MG PO TABS
5.0000 mg | ORAL_TABLET | ORAL | Status: DC | PRN
  Administered 2023-04-21 – 2023-04-23 (×4): 10 mg via ORAL
  Administered 2023-04-23 – 2023-04-24 (×3): 5 mg via ORAL
  Filled 2023-04-21 (×2): qty 2
  Filled 2023-04-21: qty 1
  Filled 2023-04-21: qty 2
  Filled 2023-04-21 (×3): qty 1

## 2023-04-21 MED ORDER — OXYCODONE HCL 5 MG PO TABS
10.0000 mg | ORAL_TABLET | ORAL | Status: DC | PRN
  Administered 2023-04-22 (×2): 15 mg via ORAL
  Administered 2023-04-23: 10 mg via ORAL
  Filled 2023-04-21 (×2): qty 2
  Filled 2023-04-21 (×2): qty 3

## 2023-04-21 MED ORDER — OXYCODONE HCL 5 MG/5ML PO SOLN: 5.0000 mg | Freq: Once | ORAL | Status: DC | PRN

## 2023-04-21 SURGICAL SUPPLY — 54 items
BLADE SAG 18X100X1.27 (BLADE) ×1 IMPLANT
BLADE SURG SZ10 CARB STEEL (BLADE) ×2 IMPLANT
BNDG CMPR 6 X 5 YARDS HK CLSR (GAUZE/BANDAGES/DRESSINGS) ×1
BNDG ELASTIC 6INX 5YD STR LF (GAUZE/BANDAGES/DRESSINGS) ×2 IMPLANT
BOWL SMART MIX CTS (DISPOSABLE) IMPLANT
CEMENT BONE SIMPLEX SPEEDSET (Cement) IMPLANT
COMP FEM PS STD 11 LT (Joint) ×1 IMPLANT
COMP PATELLA 3 PEG 38 (Joint) ×1 IMPLANT
COMP TIB PS G 0D LT (Joint) ×1 IMPLANT
COMPONENT FEM PS STD 11 LT (Joint) IMPLANT
COMPONENT PATELLA 3 PEG 38 (Joint) IMPLANT
COMPONET TIB PS G 0D LT (Joint) IMPLANT
COOLER ICEMAN CLASSIC (MISCELLANEOUS) ×1 IMPLANT
COVER SURGICAL LIGHT HANDLE (MISCELLANEOUS) ×1 IMPLANT
CUFF TOURN SGL QUICK 34 (TOURNIQUET CUFF) ×1
CUFF TRNQT CYL 34X4.125X (TOURNIQUET CUFF) ×1 IMPLANT
DRAPE INCISE IOBAN 66X45 STRL (DRAPES) ×1 IMPLANT
DRAPE U-SHAPE 47X51 STRL (DRAPES) ×1 IMPLANT
DURAPREP 26ML APPLICATOR (WOUND CARE) ×1 IMPLANT
ELECT BLADE TIP CTD 4 INCH (ELECTRODE) ×1 IMPLANT
ELECT REM PT RETURN 15FT ADLT (MISCELLANEOUS) ×1 IMPLANT
GAUZE PAD ABD 8X10 STRL (GAUZE/BANDAGES/DRESSINGS) ×2 IMPLANT
GAUZE SPONGE 4X4 12PLY STRL (GAUZE/BANDAGES/DRESSINGS) ×1 IMPLANT
GAUZE XEROFORM 1X8 LF (GAUZE/BANDAGES/DRESSINGS) IMPLANT
GLOVE BIO SURGEON STRL SZ7.5 (GLOVE) ×1 IMPLANT
GLOVE BIOGEL PI IND STRL 8 (GLOVE) ×2 IMPLANT
GLOVE ECLIPSE 8.0 STRL XLNG CF (GLOVE) ×1 IMPLANT
GOWN STRL REUS W/ TWL XL LVL3 (GOWN DISPOSABLE) ×2 IMPLANT
GOWN STRL REUS W/TWL XL LVL3 (GOWN DISPOSABLE) ×2
HANDPIECE INTERPULSE COAX TIP (DISPOSABLE) ×1
HOLDER FOLEY CATH W/STRAP (MISCELLANEOUS) IMPLANT
IMMOBILIZER KNEE 20 (SOFTGOODS) ×1
IMMOBILIZER KNEE 20 THIGH 36 (SOFTGOODS) ×1 IMPLANT
INSERT FIXED AS PERS SZ8-11 LT (Insert) IMPLANT
KIT TURNOVER KIT A (KITS) IMPLANT
NS IRRIG 1000ML POUR BTL (IV SOLUTION) ×1 IMPLANT
PACK TOTAL KNEE CUSTOM (KITS) ×1 IMPLANT
PAD COLD SHLDR WRAP-ON (PAD) ×1 IMPLANT
PADDING CAST COTTON 6X4 STRL (CAST SUPPLIES) ×2 IMPLANT
PIN DRILL HDLS TROCAR 75 4PK (PIN) IMPLANT
PROTECTOR NERVE ULNAR (MISCELLANEOUS) ×1 IMPLANT
SCREW FEMALE HEX FIX 25X2.5 (ORTHOPEDIC DISPOSABLE SUPPLIES) IMPLANT
SET HNDPC FAN SPRY TIP SCT (DISPOSABLE) ×1 IMPLANT
SET PAD KNEE POSITIONER (MISCELLANEOUS) ×1 IMPLANT
STAPLER VISISTAT 35W (STAPLE) IMPLANT
STRIP CLOSURE SKIN 1/2X4 (GAUZE/BANDAGES/DRESSINGS) IMPLANT
SUT MNCRL AB 4-0 PS2 18 (SUTURE) IMPLANT
SUT VIC AB 0 CT1 27 (SUTURE) ×1
SUT VIC AB 0 CT1 27XBRD ANTBC (SUTURE) ×1 IMPLANT
SUT VIC AB 1 CT1 36 (SUTURE) ×2 IMPLANT
SUT VIC AB 2-0 CT1 27 (SUTURE) ×2
SUT VIC AB 2-0 CT1 TAPERPNT 27 (SUTURE) ×2 IMPLANT
TRAY FOLEY MTR SLVR 16FR STAT (SET/KITS/TRAYS/PACK) IMPLANT
WATER STERILE IRR 1000ML POUR (IV SOLUTION) ×2 IMPLANT

## 2023-04-21 NOTE — Anesthesia Procedure Notes (Signed)
Procedure Name: MAC Date/Time: 04/21/2023 11:13 AM  Performed by: Elisabeth Cara, CRNAPre-anesthesia Checklist: Patient identified, Emergency Drugs available, Suction available, Patient being monitored and Timeout performed Patient Re-evaluated:Patient Re-evaluated prior to induction Oxygen Delivery Method: Simple face mask Placement Confirmation: CO2 detector Dental Injury: Teeth and Oropharynx as per pre-operative assessment

## 2023-04-21 NOTE — TOC Transition Note (Signed)
Transition of Care [facility]) - CM/SW Discharge Note   Patient Details  Name: Ronald [name] MRN: [id number] Date of Birth: September 27, 1950  Transition of Care [facility]) CM/SW Contact:  Amada Jupiter, LCSW Phone Number: 04/21/2023, 3:56 PM   Clinical Narrative:     Met with pt and wife who confirm he has needed DME in the home.  HHPT prearranged with [facility].  No further TOC needs.  Final next level of care: Home w Home Health Services Barriers to Discharge: No Barriers Identified   Patient Goals and CMS Choice      Discharge Placement                         Discharge Plan and Services Additional resources added to the After Visit Summary for                  DME Arranged: N/A DME Agency: NA       HH Arranged: PT HH Agency: [facility]        Social Determinants of Health (SDOH) Interventions SDOH Screenings   Food Insecurity: No Food Insecurity (04/21/2023)  Housing: Low Risk  (04/21/2023)  Transportation Needs: No Transportation Needs (04/21/2023)  Utilities: Not At Risk (04/21/2023)  Financial Resource Strain: Low Risk  (10/26/2022)   Received from [facility], [facility]  Physical Activity: Inactive (10/26/2022)   Received from [facility]  Social Connections: Socially Integrated (10/26/2022)   Received from [facility], [facility]  Stress: No Stress Concern Present (10/26/2022)   Received from [facility], [facility]  Tobacco Use: Medium Risk (04/21/2023)     Readmission Risk Interventions     No data to display

## 2023-04-21 NOTE — Progress Notes (Signed)
PT Cancellation Note  Patient Details Name: Ronald [name] MRN: [id number] DOB: 1951/08/22   Cancelled Treatment:     PT order received but eval deferred - RN advises pt in pain, nauseous, unable to void, and has not had Parkinsons meds.  Will follow in am.   , 04/21/2023, 4:40 PM

## 2023-04-21 NOTE — Anesthesia Procedure Notes (Signed)
Anesthesia Regional Block: Adductor canal block   Pre-Anesthetic Checklist: , timeout performed,  Correct Patient, Correct Site, Correct Laterality,  Correct Procedure, Correct Position, site marked,  Risks and benefits discussed,  Surgical consent,  Pre-op evaluation,  At surgeon's request and post-op pain management  Laterality: Left and Lower  Prep: chloraprep       Needles:  Injection technique: Single-shot  Needle Type: Echogenic Needle     Needle Length: 9cm  Needle Gauge: 21     Additional Needles:   Procedures:,,,, ultrasound used (permanent image in chart),,    Narrative:  Start time: 04/21/2023 9:57 AM End time: 04/21/2023 10:03 AM Injection made incrementally with aspirations every 5 mL.  Performed by: Personally  Anesthesiologist: Jairo Ben, MD  Additional Notes: Pt identified in Holding room.  Monitors applied. Working IV access confirmed. Timeout, Sterile prep L thigh.  #21ga ECHOgenic Arrow block needle into adductor canal with US guidance.  20cc 0.75% Ropivacaine injected incrementally after negative test dose.  Patient asymptomatic, VSS, no heme aspirated, tolerated well.   Sandford Craze, MD

## 2023-04-21 NOTE — Anesthesia Procedure Notes (Signed)
Spinal  Patient location during procedure: OR End time: 04/21/2023 11:21 AM Reason for block: surgical anesthesia Staffing Performed: anesthesiologist  Anesthesiologist: Jairo Ben, MD Performed by: Jairo Ben, MD Authorized by: Jairo Ben, MD   Preanesthetic Checklist Completed: patient identified, IV checked, site marked, risks and benefits discussed, surgical consent, monitors and equipment checked, pre-op evaluation and timeout performed Spinal Block Patient position: sitting Prep: DuraPrep Patient monitoring: heart rate, cardiac monitor, continuous pulse ox and blood pressure Approach: midline Location: L3-4 Injection technique: single-shot Needle Needle type: Pencan and Introducer  Needle gauge: 24 G Needle length: 9 cm Assessment Sensory level: T4 Events: CSF return Additional Notes Pt identified in Operating room.  Monitors applied. Working IV access confirmed. Sterile prep, drape lumbar spine.  1% lido local L 3,4.  #24ga Pencan into clear CSF L 3,4.  12mg  0.75% Bupivacaine with dextrose injected with asp CSF beginning and end of injection.  Patient asymptomatic, VSS, no heme aspirated, tolerated well.  Sandford Craze, MD

## 2023-04-21 NOTE — Op Note (Signed)
Operative Note  Date of operation: 04/21/2023 Preoperative diagnosis: Left knee primary osteoarthritis Postoperative diagnosis: Same  Procedure: Left press-fit total knee arthroplasty  Implants: Biomet/Zimmer persona press-fit knee system Implant Name Type Inv. Item Serial No. Manufacturer Lot No. LRB No. Used Action  COMP FEM PS STD 11 LT - [id number] Joint COMP FEM PS STD 11 LT  ZIMMER [employer]ON(ORTH,TRAU,BIO,SG) [id number] Left 1 Implanted  COMP PATELLA 3 PEG 38 - [id number] Joint COMP PATELLA 3 PEG 38  ZIMMER [employer]ON(ORTH,TRAU,BIO,SG) [id number] Left 1 Implanted  INSERT FIXED AS PERS SZ8-11 LT - [id number] Insert INSERT FIXED AS PERS SZ8-11 LT  ZIMMER RECON(ORTH,TRAU,BIO,SG) [id number] Left 1 Implanted  COMP TIB PS G 0D LT - [id number] Joint COMP TIB PS G 0D LT  ZIMMER RECON(ORTH,TRAU,BIO,SG) [id number] Left 1 Implanted   Surgeon: Vanita Panda. Magnus Ivan, MD Assistant: Rexene Edison, PA-C  Anesthesia: #1 left lower extremity adductor canal block, #2 spinal, #3 local Tourniquet time: Under 1 hour EBL: Less than 100 cc Antibiotics: 2 g IV Ancef Complications: None  Indications: The patient is an active 72 year old gentleman with debilitating arthritis involving his left knee.  He has had multiple years of conservative treatment of the left knee and even arthroscopic intervention in the remote past.  He has tried and failed all forms conservative treatment.  His left knee pain is daily and it is detrimentally affecting his mobility, his quality of life, and his actives daily living to the point he wishes to proceed with a total knee arthroplasty.  We agree with this as well.  The risks of acute blood loss anemia, nerve vessel injury, fracture, infection, DVT, implant failure and wound healing issues were discussed.  He understands her goals and benefits are hopefully decrease pain, improve mobility and overall improve quality of life.  Procedure description: After informed consent was obtained and the  appropriate left knee was marked, anesthesia obtained a left lower extremity adductor canal block in the holding room.  Patient was then brought to the operating room and sat up on the operating table where spinal anesthesia was obtained.  He was placed in a supine position on the operating table.  A nonsterile tourniquet is placed around his upper left thigh.  They did place a Foley catheter which was difficult to do but they finally did get it placed.  However during the case there was not any urine output noted so I let them know to perform in and out catheterization at the end the case.  Obviously if there are issues with voiding later we will consult urology.  After the left thigh, knee, leg and ankle were prepped and draped in DuraPrep and sterile drapes.  A timeout was called and he was identified as the correct patient the correct left knee.  An Esmarch was then used to wrap out the leg and the tourniquet was inflated to 300 mm of pressure.  With the knee extended we made a midline incision over the patella and carried this proximally distally.  Dissection was carried down the knee joint and a medial parapatellar arthrotomy was made.  A moderate to large joint effusion was encountered as well as loose bodies within the knee of cartilage.  With the knee in a flexed position there is complete loss of cartilage of the medial compartment the knee and the patellofemoral joint.  We removed osteophytes from all 3 compartments as well as the ACL and medial lateral meniscus.  We then used extramedullary cutting guide for making her proximal tibia cut  correction varus and valgus and a 7 degree slope.  We made this cut to take 2 mm off the low side.  We made a cut without difficulty.  We did find good quality bone on the tibia plateau surface.  We then went to the femur and used an intramedullary guide for our distal femoral cut setting this for a left knee at 5 degrees externally rotated and a 10 mm distal femoral  cut.  We made that cut without difficulty and brought the knee back down to full extension and with a 10 mm extension block achieved full extension.  We then went back to the femur for the femoral sizing guide based off the epicondylar axis.  Based off of this we chose a size 11 femur.  We put a 4-in-1 cutting block for a size 11 femur and made our anterior and posterior cuts followed our chamfer cuts.  We then moved back to the tibia and chose a size G left tibial tray for coverage over the tibial plateau setting the rotation of the tibial tubercle and the femur.  We did our drill hole and keel punch off of this.  Again we found good quality bone for press-fit implants.  We then trialed our size G left tibia followed by our size 11 CR standard left femur.  We placed a 10 mm thickness medial congruent left polythene insert and decided to go to a 12 mm insert and we replaced the range of motion and stability with a size 12 insert.  We then made our patella cut and drilled 3 holes for size 38 press-fit patella button.  We then removed all transportation from the knee and irrigate the knee with normal saline solution.  The knee was then dried real well.  We placed our Marcaine with epinephrine around the arthrotomy.  Then with the knee in a flexed position we placed our Biomet Zimmer persona tibial tray for left knee size G which was press-fit followed by our size 11 standard CR left femur.  We placed our 12 mm thickness left medial congruent polyethylene insert and press-fit our size 38 patella button.  We then put the knee through several cycles of motion we replaced the range of motion and stability.  The tourniquet was let down and hemostasis was obtained with electrocautery.  The arthrotomy was then closed with interrupted #1 Vicryl suture followed by 0 Vicryl to close the deep tissue and 2-0 Vicryl to close the subcutaneous tissue.  The skin was closed with staples.  Well-padded sterile dressings applied.  The  patient was taken the recovery room in stable condition.  Rexene Edison, PA-C did assist during the entire case and beginning to end and his assistance was medically necessary and crucial for soft tissue management and retraction, helping guide implant placement and a layered closure of the wound.

## 2023-04-21 NOTE — Transfer of Care (Signed)
Immediate Anesthesia Transfer of Care Note  Patient: Ronald [name]  Procedure(s) Performed: LEFT TOTAL KNEE ARTHROPLASTY (Left: Knee)  Patient Location: PACU  Anesthesia Type:MAC and Spinal  Level of Consciousness: awake, alert , oriented, and patient cooperative  Airway & Oxygen Therapy: Patient Spontanous Breathing and Patient connected to face mask oxygen  Post-op Assessment: Report given to RN and Post -op Vital signs reviewed and stable  Post vital signs: Reviewed and stable  Last Vitals:  Vitals Value Taken Time  BP 158/76 04/21/23 1307  Temp    Pulse 54 04/21/23 1310  Resp 11 04/21/23 1310  SpO2 100 % 04/21/23 1310  Vitals shown include unfiled device data.  Last Pain:  Vitals:   04/21/23 0807  TempSrc:   PainSc: 0-No pain         Complications: No notable events documented.

## 2023-04-21 NOTE — Anesthesia Postprocedure Evaluation (Signed)
Anesthesia Post Note  Patient: Ronald [name]  Procedure(s) Performed: LEFT TOTAL KNEE ARTHROPLASTY (Left: Knee)     Patient location during evaluation: PACU Anesthesia Type: Spinal Level of consciousness: awake and alert, patient cooperative and oriented Pain management: pain level controlled Vital Signs Assessment: post-procedure vital signs reviewed and stable Respiratory status: spontaneous breathing, nonlabored ventilation and respiratory function stable Cardiovascular status: blood pressure returned to baseline and stable Postop Assessment: no apparent nausea or vomiting and spinal receding Anesthetic complications: no   No notable events documented.  Last Vitals:  Vitals:   04/21/23 1430 04/21/23 1445  BP: (!) 162/95 (!) 170/81  Pulse: (!) 51 (!) 53  Resp: 14 12  Temp:    SpO2: 100% 100%    Last Pain:  Vitals:   04/21/23 1430  TempSrc:   PainSc: 0-No pain                 ,E. 

## 2023-04-21 NOTE — Interval H&P Note (Signed)
History and Physical Interval Note: The patient understands that he is here today for a left knee replacement to treat his severe left knee arthritis.  There has been no acute or interval change in his medical status.  The risks and benefits of surgery been discussed in detail and informed consent has been obtained.  The left operative knee has been marked.  04/21/2023 8:45 AM  Ronald [name]  has presented today for surgery, with the diagnosis of OSTEOARTHRITIS LEFT KNEE.  The various methods of treatment have been discussed with the patient and family. After consideration of risks, benefits and other options for treatment, the patient has consented to  Procedure(s): LEFT TOTAL KNEE ARTHROPLASTY (Left) as a surgical intervention.  The patient's history has been reviewed, patient examined, no change in status, stable for surgery.  I have reviewed the patient's chart and labs.  Questions were answered to the patient's satisfaction.     Kathryne Hitch

## 2023-04-22 ENCOUNTER — Other Ambulatory Visit: Payer: Self-pay

## 2023-04-22 DIAGNOSIS — I252 Old myocardial infarction: Secondary | ICD-10-CM | POA: Diagnosis not present

## 2023-04-22 DIAGNOSIS — N3281 Overactive bladder: Secondary | ICD-10-CM | POA: Diagnosis present

## 2023-04-22 DIAGNOSIS — Z882 Allergy status to sulfonamides status: Secondary | ICD-10-CM | POA: Diagnosis not present

## 2023-04-22 DIAGNOSIS — Z833 Family history of diabetes mellitus: Secondary | ICD-10-CM | POA: Diagnosis not present

## 2023-04-22 DIAGNOSIS — M25562 Pain in left knee: Secondary | ICD-10-CM | POA: Diagnosis present

## 2023-04-22 DIAGNOSIS — E119 Type 2 diabetes mellitus without complications: Secondary | ICD-10-CM | POA: Diagnosis present

## 2023-04-22 DIAGNOSIS — G20A1 Parkinson's disease without dyskinesia, without mention of fluctuations: Secondary | ICD-10-CM | POA: Diagnosis present

## 2023-04-22 DIAGNOSIS — Z87891 Personal history of nicotine dependence: Secondary | ICD-10-CM | POA: Diagnosis not present

## 2023-04-22 DIAGNOSIS — Z823 Family history of stroke: Secondary | ICD-10-CM | POA: Diagnosis not present

## 2023-04-22 DIAGNOSIS — G894 Chronic pain syndrome: Secondary | ICD-10-CM | POA: Diagnosis present

## 2023-04-22 DIAGNOSIS — E7800 Pure hypercholesterolemia, unspecified: Secondary | ICD-10-CM | POA: Diagnosis present

## 2023-04-22 DIAGNOSIS — G510 Bell's palsy: Secondary | ICD-10-CM | POA: Diagnosis present

## 2023-04-22 DIAGNOSIS — I10 Essential (primary) hypertension: Secondary | ICD-10-CM | POA: Diagnosis present

## 2023-04-22 DIAGNOSIS — Z955 Presence of coronary angioplasty implant and graft: Secondary | ICD-10-CM | POA: Diagnosis not present

## 2023-04-22 DIAGNOSIS — M1712 Unilateral primary osteoarthritis, left knee: Secondary | ICD-10-CM | POA: Diagnosis present

## 2023-04-22 DIAGNOSIS — M19012 Primary osteoarthritis, left shoulder: Secondary | ICD-10-CM | POA: Diagnosis present

## 2023-04-22 DIAGNOSIS — Z7982 Long term (current) use of aspirin: Secondary | ICD-10-CM | POA: Diagnosis not present

## 2023-04-22 DIAGNOSIS — I2510 Atherosclerotic heart disease of native coronary artery without angina pectoris: Secondary | ICD-10-CM | POA: Diagnosis present

## 2023-04-22 DIAGNOSIS — Z8616 Personal history of COVID-19: Secondary | ICD-10-CM | POA: Diagnosis not present

## 2023-04-22 DIAGNOSIS — Z79899 Other long term (current) drug therapy: Secondary | ICD-10-CM | POA: Diagnosis not present

## 2023-04-22 MED ORDER — OXYCODONE HCL 5 MG PO TABS: 5.0000 mg | ORAL_TABLET | Freq: Four times a day (QID) | ORAL | 0 refills | Status: DC | PRN

## 2023-04-22 MED ORDER — ASPIRIN 81 MG PO CHEW: 81.0000 mg | CHEWABLE_TABLET | Freq: Two times a day (BID) | ORAL | 0 refills | Status: DC

## 2023-04-22 NOTE — Plan of Care (Signed)
  Problem: Pain Management: Goal: Pain level will decrease with appropriate interventions Outcome: Progressing   Problem: Nutrition: Goal: Adequate nutrition will be maintained Outcome: Progressing   

## 2023-04-22 NOTE — Progress Notes (Signed)
Physical Therapy Treatment Patient Details Name: Ronald [name] MRN: [id number] DOB: 1950-12-24 Today's Date: 04/22/2023   History of Present Illness Pt s/p L TKR and with hx of DM, Bells Palsy, MI, back surgery and Parkinsons    PT Comments  Pt continues very cooperative and with noted improvement with gait stability and activity tolerance.    If plan is discharge home, recommend the following: A lot of help with walking and/or transfers;A little help with bathing/dressing/bathroom;Assistance with cooking/housework;Assist for transportation;Help with stairs or ramp for entrance   Can travel by private vehicle        Equipment Recommendations  None recommended by PT    Recommendations for Other Services       Precautions / Restrictions Precautions Precautions: Fall Precaution Comments: Premed for pain and Parkinsons Restrictions Weight Bearing Restrictions: No LLE Weight Bearing: Weight bearing as tolerated     Mobility  Bed Mobility Overal bed mobility: Needs Assistance Bed Mobility: Supine to Sit     Supine to sit: Min assist, Mod assist     General bed mobility comments: Increased time with cues for sequence and use of R LE to self assist    Transfers Overall transfer level: Needs assistance Equipment used: Rolling walker (2 wheels) Transfers: Sit to/from Stand Sit to Stand: Min assist, Mod assist, +2 safety/equipment, From elevated surface           General transfer comment: cues for LE management and use of UEs to self assist    Ambulation/Gait Ambulation/Gait assistance: Min assist, Mod assist Gait Distance (Feet): 65 Feet Assistive device: Rolling walker (2 wheels) Gait Pattern/deviations: Step-to pattern, Decreased step length - right, Decreased step length - left, Shuffle, Trunk flexed Gait velocity: decr     General Gait Details: INcreased time with cues for sequence, posture and position from [employer]             Wheelchair  Mobility     Tilt Bed    Modified Rankin (Stroke Patients Only)       Balance Overall balance assessment: Needs assistance Sitting-balance support: No upper extremity supported, Feet supported Sitting balance-Leahy Scale: Fair     Standing balance support: Bilateral upper extremity supported Standing balance-Leahy Scale: Poor                              Cognition Arousal/Alertness: Awake/alert Behavior During Therapy: WFL for tasks assessed/performed Overall Cognitive Status: Within Functional Limits for tasks assessed                                          Exercises Total Joint Exercises Ankle Circles/Pumps: AROM, Both, 15 reps, Supine Quad Sets: AROM, Both, 10 reps, Supine Heel Slides: AAROM, Left, 15 reps, Supine Straight Leg Raises: AAROM, Left, 10 reps, Supine    General Comments        Pertinent Vitals/Pain Pain Assessment Pain Assessment: 0-10 Pain Score: 5  Pain Location: L knee Pain Descriptors / Indicators: Aching, Sore Pain Intervention(s): Limited activity within patient's tolerance, Monitored during session, Premedicated before session, Ice applied    Home Living                          Prior Function            PT Goals (  current goals can now be found in the care plan section) Acute Rehab PT Goals Patient Stated Goal: Regain IND PT Goal Formulation: With patient Time For Goal Achievement: 04/29/23 Potential to Achieve Goals: Fair Progress towards PT goals: Progressing toward goals    Frequency    7X/week      PT Plan Current plan remains appropriate    Co-evaluation              AM-PAC PT "6 Clicks" Mobility   Outcome Measure  Help needed turning from your back to your side while in a flat bed without using bedrails?: A Lot Help needed moving from lying on your back to sitting on the side of a flat bed without using bedrails?: A Lot Help needed moving to and from a bed to a  chair (including a wheelchair)?: A Lot Help needed standing up from a chair using your arms (e.g., wheelchair or bedside chair)?: A Lot Help needed to walk in hospital room?: A Lot Help needed climbing 3-5 steps with a railing? : Total 6 Click Score: 11    End of Session Equipment Utilized During Treatment: Gait belt Activity Tolerance: Patient tolerated treatment well Patient left: in chair;with call bell/phone within reach;with chair alarm set;with family/visitor present Nurse Communication: Mobility status PT Visit Diagnosis: Muscle weakness (generalized) (M62.81);Difficulty in walking, not elsewhere classified (R26.2)     Time: 5621-3086 PT Time Calculation (min) (ACUTE ONLY): 30 min  Charges:    $Gait Training: 23-37 mins PT General Charges $$ ACUTE PT VISIT: 1 Visit                     Mauro Kaufmann PT Acute Rehabilitation Services Pager [phone] Office [phone]    , 04/22/2023, 4:54 PM

## 2023-04-22 NOTE — Discharge Instructions (Signed)

## 2023-04-22 NOTE — Progress Notes (Signed)
Subjective: 1 Day Post-Op Procedure(s) (LRB): LEFT TOTAL KNEE ARTHROPLASTY (Left) Patient reports pain as moderate.  He has worked with therapy this morning.  I have spoken with physical therapy.  We both agree that the patient needs to be here longer.  He is a significant fall risk given his age combined with his Parkinson's disease.  He does have good family support and they are at the bedside.  Objective: Vital signs in last 24 hours: Temp:  [97.4 F (36.3 C)-98.7 F (37.1 C)] 98.6 F (37 C) (08/03 1010) Pulse Rate:  [48-62] 62 (08/03 1010) Resp:  [11-18] 18 (08/03 1010) BP: (110-190)/(54-95) 110/63 (08/03 1010) SpO2:  [95 %-100 %] 95 % (08/03 1010) Weight:  [79.7 kg] 79.7 kg (08/02 2039)  Intake/Output from previous day: 08/02 0701 - 08/03 0700 In: 2625.8 [I.V.:2275.8; IV Piggyback:350] Out: 1750 [Urine:1700; Blood:50] Intake/Output this shift: No intake/output data recorded.  Recent Labs    04/22/23 0408  HGB 9.5*   Recent Labs    04/22/23 0408  WBC 8.0  RBC 3.28*  HCT 30.3*  PLT 173   Recent Labs    04/22/23 0408  NA 131*  K 4.9  CL 102  CO2 22  BUN 22  CREATININE 1.18  GLUCOSE 232*  CALCIUM 8.3*   No results for input(s): "LABPT", "INR" in the last 72 hours.  Sensation intact distally Intact pulses distally Dorsiflexion/Plantar flexion intact Incision: dressing C/D/I Compartment soft   Assessment/Plan: 1 Day Post-Op Procedure(s) (LRB): LEFT TOTAL KNEE ARTHROPLASTY (Left) Up with therapy  I will definitely make him an inpatient admission given the fact that he needs to be here 1-2 more days in order to maximize therapy.  Given his Parkinson disease he is at a heightened risk for having a fall so making him an inpatient medicine is medically prudent.    Kathryne Hitch 04/22/2023, 11:43 AM

## 2023-04-22 NOTE — Evaluation (Signed)
Physical Therapy Evaluation Patient Details Name: Ronald [name] MRN: [id number] DOB: [birth year] Today's Date: 04/22/2023  History of Present Illness  Pt s/p L TKR and with hx of DM, Bells Palsy, MI, back surgery and Parkinsons  Clinical Impression  Pt s/p L TKR and presents with decreased L LE strength/ROM, post op pain and premorbid deconditioning limiting functional mobility.  Pt hopes to progress to dc home with family assist and HHPT follow up.      If plan is discharge home, recommend the following: A lot of help with walking and/or transfers;A little help with bathing/dressing/bathroom;Assistance with cooking/housework;Assist for transportation;Help with stairs or ramp for entrance   Can travel by private vehicle        Equipment Recommendations None recommended by PT  Recommendations for Other Services       Functional Status Assessment Patient has had a recent decline in their functional status and demonstrates the ability to make significant improvements in function in a reasonable and predictable amount of time.     Precautions / Restrictions Precautions Precautions: Fall Precaution Comments: Premed for pain and Parkinsons Restrictions Weight Bearing Restrictions: No LLE Weight Bearing: Weight bearing as tolerated      Mobility  Bed Mobility Overal bed mobility: Needs Assistance Bed Mobility: Supine to Sit     Supine to sit: Min assist, Mod assist, +2 for physical assistance, +2 for safety/equipment     General bed mobility comments: Increased time with cues for sequence and use of R LE to self assist    Transfers Overall transfer level: Needs assistance Equipment used: Rolling walker (2 wheels) Transfers: Sit to/from Stand Sit to Stand: Mod assist, +2 physical assistance, +2 safety/equipment, From elevated surface           General transfer comment: cues for LE management and use of UEs to self assist    Ambulation/Gait Ambulation/Gait  assistance: Min assist, Mod assist, +2 safety/equipment Gait Distance (Feet): 12 Feet Assistive device: Rolling walker (2 wheels) Gait Pattern/deviations: Step-to pattern, Decreased step length - right, Decreased step length - left, Shuffle, Trunk flexed Gait velocity: decr     General Gait Details: INcreased time with cues for sequence, posture and position from [employer]            Wheelchair Mobility     Tilt Bed    Modified Rankin (Stroke Patients Only)       Balance Overall balance assessment: Needs assistance Sitting-balance support: No upper extremity supported, Feet supported Sitting balance-Leahy Scale: Fair     Standing balance support: Bilateral upper extremity supported Standing balance-Leahy Scale: Poor                               Pertinent Vitals/Pain Pain Assessment Pain Assessment: 0-10 Pain Score: 5  Pain Location: L knee Pain Descriptors / Indicators: Aching, Sore Pain Intervention(s): Limited activity within patient's tolerance, Monitored during session, Premedicated before session, Ice applied    Home Living Family/patient expects to be discharged to:: Private residence Living Arrangements: Spouse/significant other Available Help at Discharge: Family Type of Home: House Home Access: Stairs to enter Entrance Stairs-Rails: Right Entrance Stairs-Number of Steps: 6   Home Layout: One level Home Equipment: Agricultural consultant (2 wheels);Cane - single point      Prior Function Prior Level of Function : Independent/Modified Independent  Hand Dominance        Extremity/Trunk Assessment   Upper Extremity Assessment Upper Extremity Assessment: Generalized weakness    Lower Extremity Assessment Lower Extremity Assessment: LLE deficits/detail;Generalized weakness LLE Deficits / Details: 2/5 quads with AAROM at knee -5 - 35    Cervical / Trunk Assessment Cervical / Trunk Assessment: Kyphotic   Communication   Communication: Expressive difficulties (delayed responses and soft spoken)  Cognition Arousal/Alertness: Awake/alert Behavior During Therapy: WFL for tasks assessed/performed Overall Cognitive Status: Within Functional Limits for tasks assessed                                          General Comments      Exercises Total Joint Exercises Ankle Circles/Pumps: AROM, Both, 15 reps, Supine Quad Sets: AROM, Both, 10 reps, Supine Heel Slides: AAROM, Left, 15 reps, Supine Straight Leg Raises: AAROM, Left, 10 reps, Supine   Assessment/Plan    PT Assessment Patient needs continued PT services  PT Problem List Decreased strength;Decreased range of motion;Decreased mobility;Decreased balance;Decreased activity tolerance;Decreased knowledge of use of DME;Pain       PT Treatment Interventions DME instruction;Gait training;Stair training;Functional mobility training;Therapeutic activities;Therapeutic exercise;Patient/family education    PT Goals (Current goals can be found in the Care Plan section)  Acute Rehab PT Goals Patient Stated Goal: Regain IND PT Goal Formulation: With patient Time For Goal Achievement: 04/29/23 Potential to Achieve Goals: Fair    Frequency 7X/week     Co-evaluation               AM-PAC PT "6 Clicks" Mobility  Outcome Measure Help needed turning from your back to your side while in a flat bed without using bedrails?: A Lot Help needed moving from lying on your back to sitting on the side of a flat bed without using bedrails?: A Lot Help needed moving to and from a bed to a chair (including a wheelchair)?: A Lot Help needed standing up from a chair using your arms (e.g., wheelchair or bedside chair)?: A Lot Help needed to walk in hospital room?: Total Help needed climbing 3-5 steps with a railing? : Total 6 Click Score: 10    End of Session Equipment Utilized During Treatment: Gait belt Activity Tolerance: Patient  tolerated treatment well Patient left: in chair;with call bell/phone within reach;with chair alarm set;with family/visitor present Nurse Communication: Mobility status PT Visit Diagnosis: Muscle weakness (generalized) (M62.81);Difficulty in walking, not elsewhere classified (R26.2)    Time: 2956-2130 PT Time Calculation (min) (ACUTE ONLY): 38 min   Charges:   PT Evaluation $PT Eval Low Complexity: 1 Low PT Treatments $Gait Training: 8-22 mins $Therapeutic Exercise: 8-22 mins PT General Charges $$ ACUTE PT VISIT: 1 Visit         Mauro Kaufmann PT Acute Rehabilitation Services Pager [phone] Office [phone]   , 04/22/2023, 1:00 PM

## 2023-04-22 NOTE — Plan of Care (Signed)
  Problem: Education: Goal: Knowledge of the prescribed therapeutic regimen will improve Outcome: Progressing Goal: Individualized Educational Video(s) Outcome: Progressing   Problem: Activity: Goal: Ability to avoid complications of mobility impairment will improve Outcome: Progressing Goal: Range of joint motion will improve Outcome: Progressing   Problem: Clinical Measurements: Goal: Postoperative complications will be avoided or minimized Outcome: Progressing   Problem: Pain Management: Goal: Pain level will decrease with appropriate interventions Outcome: Progressing   Problem: Education: Goal: Knowledge of General Education information will improve Description: Including pain rating scale, medication(s)/side effects and non-pharmacologic comfort measures Outcome: Progressing   Problem: Clinical Measurements: Goal: Will remain free from infection Outcome: Progressing   Problem: Activity: Goal: Risk for activity intolerance will decrease Outcome: Progressing   Problem: Nutrition: Goal: Adequate nutrition will be maintained Outcome: Progressing   Problem: Coping: Goal: Level of anxiety will decrease Outcome: Progressing   Problem: Pain Managment: Goal: General experience of comfort will improve Outcome: Progressing   Problem: Safety: Goal: Ability to remain free from injury will improve Outcome: Progressing

## 2023-04-23 NOTE — Progress Notes (Signed)
Physical Therapy Treatment Patient Details Name: Ronald [name] MRN: [id number] DOB: Apr 28, 1951 Today's Date: 04/23/2023   History of Present Illness Pt s/p L TKR and with hx of DM, Bells Palsy, MI, back surgery and Parkinsons    PT Comments  Pt cooperative and completed therex with assist but requesting rest break prior to ambulation.    If plan is discharge home, recommend the following: A lot of help with walking and/or transfers;A little help with bathing/dressing/bathroom;Assistance with cooking/housework;Assist for transportation;Help with stairs or ramp for entrance   Can travel by private vehicle        Equipment Recommendations  None recommended by PT    Recommendations for Other Services       Precautions / Restrictions Precautions Precautions: Fall Precaution Comments: Premed for pain and Parkinsons Restrictions Weight Bearing Restrictions: No LLE Weight Bearing: Weight bearing as tolerated     Mobility  Bed Mobility               General bed mobility comments: Pt up in chair    Transfers                        Ambulation/Gait                   Stairs             Wheelchair Mobility     Tilt Bed    Modified Rankin (Stroke Patients Only)       Balance                                            Cognition Arousal/Alertness: Awake/alert Behavior During Therapy: WFL for tasks assessed/performed Overall Cognitive Status: Within Functional Limits for tasks assessed                                          Exercises Total Joint Exercises Ankle Circles/Pumps: AROM, Both, 15 reps, Supine Quad Sets: AROM, Both, 10 reps, Supine Heel Slides: AAROM, Left, 15 reps, Supine Straight Leg Raises: AAROM, Left, Supine, 20 reps    General Comments        Pertinent Vitals/Pain Pain Assessment Pain Assessment: 0-10 Pain Score: 6  Pain Location: L knee Pain Descriptors / Indicators:  Aching, Sore Pain Intervention(s): Limited activity within patient's tolerance, Monitored during session, Premedicated before session, Ice applied    Home Living                          Prior Function            PT Goals (current goals can now be found in the care plan section) Acute Rehab PT Goals Patient Stated Goal: Regain IND PT Goal Formulation: With patient Time For Goal Achievement: 04/29/23 Potential to Achieve Goals: Fair Progress towards PT goals: Progressing toward goals    Frequency    7X/week      PT Plan Current plan remains appropriate    Co-evaluation              AM-PAC PT "6 Clicks" Mobility   Outcome Measure  Help needed turning from your back to your side while in a flat bed without using bedrails?: A Lot Help needed  moving from lying on your back to sitting on the side of a flat bed without using bedrails?: A Lot Help needed moving to and from a bed to a chair (including a wheelchair)?: A Lot Help needed standing up from a chair using your arms (e.g., wheelchair or bedside chair)?: A Lot Help needed to walk in hospital room?: A Lot Help needed climbing 3-5 steps with a railing? : Total 6 Click Score: 11    End of Session Equipment Utilized During Treatment: Gait belt Activity Tolerance: Patient tolerated treatment well Patient left: in chair;with call bell/phone within reach;with chair alarm set;with family/visitor present Nurse Communication: Mobility status PT Visit Diagnosis: Muscle weakness (generalized) (M62.81);Difficulty in walking, not elsewhere classified (R26.2)     Time: 4696-2952 PT Time Calculation (min) (ACUTE ONLY): 18 min  Charges:    $Therapeutic Exercise: 8-22 mins PT General Charges $$ ACUTE PT VISIT: 1 Visit                     Mauro Kaufmann PT Acute Rehabilitation Services Pager [phone] Office [phone]    , 04/23/2023, 11:14 AM

## 2023-04-23 NOTE — Progress Notes (Signed)
  Subjective: Ronald [name] is a 72 y.o. male s/p left TKA.  They are POD 2.  Pt's pain is controlled.  Pt denies any complain of chest pain, shortness of breath, abdominal pain, calf pain.  Patient denies any fevers or chills.  Perform better with physical therapy yesterday evening compared with yesterday morning.  He is hoping to discharge today.  Has not worked with physical therapy yet today.  Objective: Vital signs in last 24 hours: Temp:  [97.9 F (36.6 C)-99.3 F (37.4 C)] 99.3 F (37.4 C) (08/04 0625) Pulse Rate:  [60-65] 65 (08/04 0625) Resp:  [16-18] 17 (08/04 0625) BP: (110-132)/(54-65) 122/65 (08/04 0625) SpO2:  [95 %-100 %] 96 % (08/04 0625)  Intake/Output from previous day: 08/03 0701 - 08/04 0700 In: -  Out: 675 [Urine:675] Intake/Output this shift: No intake/output data recorded.  Exam:  No gross blood or drainage overlying the dressing Left foot is warm and well-perfused Sensation intact distally in the operative foot Able to dorsiflex and plantarflex the operative foot No calf tenderness.  Negative Homans' sign. Unable to perform straight leg raise   Labs: Recent Labs    04/22/23 0408  HGB 9.5*   Recent Labs    04/22/23 0408  WBC 8.0  RBC 3.28*  HCT 30.3*  PLT 173   Recent Labs    04/22/23 0408  NA 131*  K 4.9  CL 102  CO2 22  BUN 22  CREATININE 1.18  GLUCOSE 232*  CALCIUM 8.3*   No results for input(s): "LABPT", "INR" in the last 72 hours.  Assessment/Plan: Pt is POD 2 s/p TKA.    -Plan to discharge to home today or tomorrow pending patient's pain and PT eval  -WBAT with a walker  -Follow-up with Dr. Magnus Ivan in clinic 2 weeks postoperatively    [facility] 04/23/2023, 8:18 AM

## 2023-04-23 NOTE — Progress Notes (Signed)
Physical Therapy Treatment Patient Details Name: Ronald [name] MRN: [id number] DOB: [birth year] Today's Date: 04/23/2023   History of Present Illness Pt s/p L TKR and with hx of DM, Bells Palsy, MI, back surgery and Parkinsons    PT Comments  Pt up to ambulate limited distance in hall requiring assist for balance and frequent cues for sequence/technique.  Pt assisted chair to [facility] and requiring cues and significant assist to bring wt up and fwd and to balance in standing.  Pt motivated but fatigues easily.  Pt had hoped to progress to dc home this date and requests reattempt later this date.   If plan is discharge home, recommend the following: A lot of help with walking and/or transfers;A little help with bathing/dressing/bathroom;Assistance with cooking/housework;Assist for transportation;Help with stairs or ramp for entrance   Can travel by private vehicle        Equipment Recommendations  None recommended by PT    Recommendations for Other Services       Precautions / Restrictions Precautions Precautions: Fall Precaution Comments: Premed for pain and Parkinsons Restrictions Weight Bearing Restrictions: No LLE Weight Bearing: Weight bearing as tolerated     Mobility  Bed Mobility               General bed mobility comments: Pt up in chair and returns to same    Transfers Overall transfer level: Needs assistance Equipment used: Rolling walker (2 wheels) Transfers: Sit to/from Stand, Bed to chair/wheelchair/BSC Sit to Stand: Min assist, Mod assist   Step pivot transfers: Min assist, Mod assist       General transfer comment: cues for LE management and use of UEs to self assist.  Physical assist to bring wt up and fwd and to balance in standing.  Step pvt recliner to [facility]    Ambulation/Gait Ambulation/Gait assistance: Min assist Gait Distance (Feet): 38 Feet Assistive device: Rolling walker (2 wheels) Gait Pattern/deviations: Step-to pattern, Decreased step  length - right, Decreased step length - left, Shuffle, Trunk flexed Gait velocity: decr     General Gait Details: INcreased time with cues for sequence, posture and position from RW.  Distance ltd by fatigue   Stairs             Wheelchair Mobility     Tilt Bed    Modified Rankin (Stroke Patients Only)       Balance Overall balance assessment: Needs assistance Sitting-balance support: No upper extremity supported, Feet supported Sitting balance-Leahy Scale: Fair     Standing balance support: Bilateral upper extremity supported Standing balance-Leahy Scale: Poor                              Cognition Arousal/Alertness: Awake/alert Behavior During Therapy: WFL for tasks assessed/performed Overall Cognitive Status: Within Functional Limits for tasks assessed                                          Exercises Total Joint Exercises Ankle Circles/Pumps: AROM, Both, 15 reps, Supine Quad Sets: AROM, Both, 10 reps, Supine Heel Slides: AAROM, Left, 15 reps, Supine Straight Leg Raises: AAROM, Left, Supine, 20 reps    General Comments        Pertinent Vitals/Pain Pain Assessment Pain Assessment: 0-10 Pain Score: 5  Pain Location: L knee Pain Descriptors / Indicators: Aching, Sore Pain Intervention(s): Limited  activity within patient's tolerance, Monitored during session, Premedicated before session    Home Living                          Prior Function            PT Goals (current goals can now be found in the care plan section) Acute Rehab PT Goals Patient Stated Goal: Regain IND PT Goal Formulation: With patient Time For Goal Achievement: 04/29/23 Potential to Achieve Goals: Fair Progress towards PT goals: Progressing toward goals    Frequency    7X/week      PT Plan Current plan remains appropriate    Co-evaluation              AM-PAC PT "6 Clicks" Mobility   Outcome Measure  Help needed  turning from your back to your side while in a flat bed without using bedrails?: A Lot Help needed moving from lying on your back to sitting on the side of a flat bed without using bedrails?: A Lot Help needed moving to and from a bed to a chair (including a wheelchair)?: A Lot Help needed standing up from a chair using your arms (e.g., wheelchair or bedside chair)?: A Lot Help needed to walk in hospital room?: A Little Help needed climbing 3-5 steps with a railing? : Total 6 Click Score: 12    End of Session Equipment Utilized During Treatment: Gait belt Activity Tolerance: Patient tolerated treatment well Patient left: with call bell/phone within reach;with family/visitor present;Other (comment) [facility]) Nurse Communication: Mobility status PT Visit Diagnosis: Muscle weakness (generalized) (M62.81);Difficulty in walking, not elsewhere classified (R26.2)     Time: 1310-1331 PT Time Calculation (min) (ACUTE ONLY): 21 min  Charges:    $Gait Training: 8-22 mins PT General Charges $$ ACUTE PT VISIT: 1 Visit                     Mauro Kaufmann PT Acute Rehabilitation Services Pager [phone] Office [phone]    , 04/23/2023, 4:05 PM

## 2023-04-23 NOTE — Plan of Care (Signed)
  Problem: Pain Management: Goal: Pain level will decrease with appropriate interventions Outcome: Progressing   Problem: Coping: Goal: Level of anxiety will decrease Outcome: Progressing   

## 2023-04-23 NOTE — Progress Notes (Signed)
Physical Therapy Treatment Patient Details Name: Ronald [name] MRN: [id number] DOB: [birth year] Today's Date: 04/23/2023   History of Present Illness Pt s/p L TKR and with hx of DM, Bells Palsy, MI, back surgery and Parkinsons    PT Comments  Pt continues motivated but limited this date by increased fatigue and balance deficits.  Unsafe to attempt stairs this date.  Pt hopeful for dc home tomorrow.    If plan is discharge home, recommend the following: A lot of help with walking and/or transfers;A little help with bathing/dressing/bathroom;Assistance with cooking/housework;Assist for transportation;Help with stairs or ramp for entrance   Can travel by private vehicle        Equipment Recommendations  None recommended by PT    Recommendations for Other Services       Precautions / Restrictions Precautions Precautions: Fall Precaution Comments: Premed for pain and Parkinsons Restrictions Weight Bearing Restrictions: No LLE Weight Bearing: Weight bearing as tolerated     Mobility  Bed Mobility Overal bed mobility: Needs Assistance Bed Mobility: Sit to Supine       Sit to supine: Min assist, Mod assist   General bed mobility comments: Increased time with sequence cues and assist to manage LEs`    Transfers Overall transfer level: Needs assistance Equipment used: Rolling walker (2 wheels) Transfers: Sit to/from Stand, Bed to chair/wheelchair/BSC Sit to Stand: Min assist, Mod assist   Step pivot transfers: Min assist, Mod assist       General transfer comment: cues for LE management and use of UEs to self assist.  Physical assist to bring wt up and fwd and to balance in standing.  Step pvt recliner to bedside    Ambulation/Gait Ambulation/Gait assistance: Min assist Gait Distance (Feet): 36 Feet Assistive device: Rolling walker (2 wheels) Gait Pattern/deviations: Step-to pattern, Decreased step length - right, Decreased step length - left, Shuffle, Trunk  flexed Gait velocity: decr     General Gait Details: INcreased time with cues for sequence, posture and position from RW.  Distance ltd by fatigue   Stairs             Wheelchair Mobility     Tilt Bed    Modified Rankin (Stroke Patients Only)       Balance Overall balance assessment: Needs assistance Sitting-balance support: No upper extremity supported, Feet supported Sitting balance-Leahy Scale: Fair     Standing balance support: Bilateral upper extremity supported Standing balance-Leahy Scale: Poor                              Cognition Arousal/Alertness: Awake/alert Behavior During Therapy: WFL for tasks assessed/performed Overall Cognitive Status: Within Functional Limits for tasks assessed                                          Exercises Total Joint Exercises Ankle Circles/Pumps: AROM, Both, 15 reps, Supine Quad Sets: AROM, Both, 10 reps, Supine Heel Slides: AAROM, Left, 15 reps, Supine Straight Leg Raises: AAROM, Left, Supine, 20 reps    General Comments        Pertinent Vitals/Pain Pain Assessment Pain Assessment: 0-10 Pain Score: 5  Pain Location: L knee Pain Descriptors / Indicators: Aching, Sore Pain Intervention(s): Limited activity within patient's tolerance, Monitored during session, Premedicated before session    Home Living  Prior Function            PT Goals (current goals can now be found in the care plan section) Acute Rehab PT Goals Patient Stated Goal: Regain IND PT Goal Formulation: With patient Time For Goal Achievement: 04/29/23 Potential to Achieve Goals: Fair Progress towards PT goals: Progressing toward goals    Frequency    7X/week      PT Plan Current plan remains appropriate    Co-evaluation              AM-PAC PT "6 Clicks" Mobility   Outcome Measure  Help needed turning from your back to your side while in a flat bed without  using bedrails?: A Lot Help needed moving from lying on your back to sitting on the side of a flat bed without using bedrails?: A Lot Help needed moving to and from a bed to a chair (including a wheelchair)?: A Lot Help needed standing up from a chair using your arms (e.g., wheelchair or bedside chair)?: A Lot Help needed to walk in hospital room?: A Little Help needed climbing 3-5 steps with a railing? : Total 6 Click Score: 12    End of Session Equipment Utilized During Treatment: Gait belt Activity Tolerance: Patient tolerated treatment well Patient left: with call bell/phone within reach;in bed;with bed alarm set;with family/visitor present Nurse Communication: Mobility status PT Visit Diagnosis: Muscle weakness (generalized) (M62.81);Difficulty in walking, not elsewhere classified (R26.2)     Time: 1432-1500 PT Time Calculation (min) (ACUTE ONLY): 28 min  Charges:    $Gait Training: 8-22 mins $Therapeutic Activity: 8-22 mins PT General Charges $$ ACUTE PT VISIT: 1 Visit                     Mauro Kaufmann PT Acute Rehabilitation Services Pager [phone] Office [phone]    , 04/23/2023, 4:13 PM

## 2023-04-23 NOTE — Plan of Care (Signed)
  Problem: Education: Goal: Knowledge of the prescribed therapeutic regimen will improve Outcome: Progressing   Problem: Pain Management: Goal: Pain level will decrease with appropriate interventions Outcome: Progressing   Problem: Education: Goal: Knowledge of General Education information will improve Description: Including pain rating scale, medication(s)/side effects and non-pharmacologic comfort measures Outcome: Progressing   

## 2023-04-23 NOTE — Plan of Care (Signed)
  Problem: Pain Management: Goal: Pain level will decrease with appropriate interventions Outcome: Progressing   

## 2023-04-24 ENCOUNTER — Encounter (HOSPITAL_COMMUNITY): Payer: Self-pay | Admitting: Orthopaedic Surgery

## 2023-04-24 NOTE — Progress Notes (Signed)
Subjective: 3 Days Post-Op Procedure(s) (LRB): LEFT TOTAL KNEE ARTHROPLASTY (Left) Patient reports pain as 6 on 0-10 scale.  Denies nausea ,CP or SOB.   Objective: Vital signs in last 24 hours: Temp:  [98.2 F (36.8 C)-99.4 F (37.4 C)] 98.6 F (37 C) (08/05 0541) Pulse Rate:  [65-69] 65 (08/05 0541) Resp:  [16-17] 17 (08/05 0541) BP: (94-111)/(50-53) 98/51 (08/05 0541) SpO2:  [97 %-98 %] 98 % (08/05 0541)  Intake/Output from previous day: 08/04 0701 - 08/05 0700 In: 840 [P.O.:840] Out: 700 [Urine:700] Intake/Output this shift: No intake/output data recorded.  Recent Labs    04/22/23 0408  HGB 9.5*   Recent Labs    04/22/23 0408  WBC 8.0  RBC 3.28*  HCT 30.3*  PLT 173   Recent Labs    04/22/23 0408  NA 131*  K 4.9  CL 102  CO2 22  BUN 22  CREATININE 1.18  GLUCOSE 232*  CALCIUM 8.3*   No results for input(s): "LABPT", "INR" in the last 72 hours.  Dorsiflexion/Plantar flexion intact Incision: dressing C/D/I Compartment soft   Assessment/Plan: 3 Days Post-Op Procedure(s) (LRB): LEFT TOTAL KNEE ARTHROPLASTY (Left) Up with therapy Discharge home with home health if patient progresses well with PT today. Needs to do steps prior to discharge has 6 steps into home.        04/24/2023, 7:59 AM

## 2023-04-24 NOTE — Progress Notes (Signed)
PT TX NOTE  04/24/23 1349  PT Visit Information  Last PT Received On 04/24/23  Assistance Needed Pt progressing this session; tol incr amb distance however with some mild dizziness after ~ 50 requiring seated rest. BP 104/59 Will see again this pm, possible stair training if pt is able to tol  History of Present Illness Pt s/p L TKR and with hx of DM, Bells Palsy, MI, back surgery and Parkinsons  Subjective Data  Patient Stated Goal Regain IND  Precautions  Precautions Fall  Precaution Comments Premed for pain and Parkinson's  Restrictions  Weight Bearing Restrictions No  LLE Weight Bearing WBAT  Pain Assessment  Pain Assessment Faces  Faces Pain Scale 4  Pain Location L knee  Pain Descriptors / Indicators Aching;Sore  Pain Intervention(s) Limited activity within patient's tolerance;Monitored during session;Premedicated before session;Repositioned  Cognition  Arousal/Alertness Awake/alert  Behavior During Therapy WFL for tasks assessed/performed  Overall Cognitive Status Within Functional Limits for tasks assessed  Transfers  Overall transfer level Needs assistance  Equipment used Rolling walker (2 wheels)  Transfers Sit to/from Stand  Sit to Stand Min assist  Step pivot transfers Min assist  General transfer comment cues for LE management and use of UEs to self assist.  Physical assist to bring wt up and fwd and to balance in standing.  Ambulation/Gait  Ambulation/Gait assistance Min assist;Min guard  Gait Distance (Feet) 50 Feet  Assistive device Rolling walker (2 wheels)  Gait Pattern/deviations Step-to pattern;Decreased step length - right;Decreased step length - left;Trunk flexed  General Gait Details INcreased time with cues for sequence, posture and position from RW.  Distance ltd by fatigue, dizziness, BP 104/59 seated in recliner  Gait velocity decr  Balance  Overall balance assessment Needs assistance  Sitting-balance support No upper extremity supported;Feet  supported  Sitting balance-Leahy Scale Fair  Standing balance support Bilateral upper extremity supported  Standing balance-Leahy Scale Poor  PT - End of Session  Equipment Utilized During Treatment Gait belt  Activity Tolerance Patient tolerated treatment well  Patient left with call bell/phone within reach;in chair;with chair alarm set  Nurse Communication Mobility status   PT - Assessment/Plan  PT Plan Current plan remains appropriate  PT Visit Diagnosis Muscle weakness (generalized) (M62.81);Difficulty in walking, not elsewhere classified (R26.2)  PT Frequency (ACUTE ONLY) 7X/week  Follow Up Recommendations Follow physician's recommendations for discharge plan and follow up therapies  Assistance recommended at discharge Frequent or constant Supervision/Assistance  Patient can return home with the following A lot of help with walking and/or transfers;A little help with bathing/dressing/bathroom;Assistance with cooking/housework;Assist for transportation;Help with stairs or ramp for entrance  PT equipment None recommended by PT  AM-PAC PT "6 Clicks" Mobility Outcome Measure (Version 2)  Help needed turning from your back to your side while in a flat bed without using bedrails? 2  Help needed moving from lying on your back to sitting on the side of a flat bed without using bedrails? 2  Help needed moving to and from a bed to a chair (including a wheelchair)? 2  Help needed standing up from a chair using your arms (e.g., wheelchair or bedside chair)? 3  Help needed to walk in hospital room? 3  Help needed climbing 3-5 steps with a railing?  1  6 Click Score 13  Consider Recommendation of Discharge To: CIR/SNF/LTACH  PT Goal Progression  Progress towards PT goals Progressing toward goals  Acute Rehab PT Goals  PT Goal Formulation With patient  Time For Goal  Achievement 04/29/23  Potential to Achieve Goals Fair  PT Time Calculation  PT Start Time (ACUTE ONLY) 1233  PT Stop Time (ACUTE  ONLY) 1245  PT Time Calculation (min) (ACUTE ONLY) 12 min  PT General Charges  $$ ACUTE PT VISIT 1 Visit  PT Treatments  $Gait Training 8-22 mins

## 2023-04-24 NOTE — Plan of Care (Signed)
Pt ready to DC home with family. 

## 2023-04-24 NOTE — Progress Notes (Signed)
Physical Therapy Treatment Patient Details Name: Ronald [name] MRN: [id number] DOB: 11/05/1950 Today's Date: 04/24/2023   History of Present Illness Pt s/p L TKR and with hx of DM, Bells Palsy, MI, back surgery and Parkinsons    PT Comments  Pt progressing, requiring mod assist for bed mobility and transfers and incr time; will return to work on amb/stairs for possible d/c today     If plan is discharge home, recommend the following: A lot of help with walking and/or transfers;A little help with bathing/dressing/bathroom;Assistance with cooking/housework;Assist for transportation;Help with stairs or ramp for entrance   Can travel by private vehicle        Equipment Recommendations  None recommended by PT    Recommendations for Other Services       Precautions / Restrictions Precautions Precautions: Fall Precaution Comments: Premed for pain and Parkinson's Restrictions Weight Bearing Restrictions: No LLE Weight Bearing: Weight bearing as tolerated     Mobility  Bed Mobility Overal bed mobility: Needs Assistance Bed Mobility: Supine to Sit       Sit to supine: Min assist, Mod assist   General bed mobility comments: Increased time with sequence cues and assist to manage LEs`    Transfers Overall transfer level: Needs assistance Equipment used: Rolling walker (2 wheels) Transfers: Sit to/from Stand, Bed to chair/wheelchair/BSC Sit to Stand: Min assist, Mod assist   Step pivot transfers: Min assist, Mod assist       General transfer comment: cues for LE management and use of UEs to self assist.  Physical assist to bring wt up and fwd and to balance in standing.  Step pvt bed to bedside    Ambulation/Gait Ambulation/Gait assistance: Min assist   Assistive device: Rolling walker (2 wheels) Gait Pattern/deviations: Step-to pattern, Decreased step length - right, Decreased step length - left, Shuffle, Trunk flexed Gait velocity: decr     General Gait Details:  INcreased time with cues for sequence, posture and position from RW.  Distance ltd by fatigue   Stairs             Wheelchair Mobility     Tilt Bed    Modified Rankin (Stroke Patients Only)       Balance Overall balance assessment: Needs assistance Sitting-balance support: No upper extremity supported, Feet supported Sitting balance-Leahy Scale: Fair     Standing balance support: Bilateral upper extremity supported Standing balance-Leahy Scale: Poor                              Cognition Arousal/Alertness: Awake/alert Behavior During Therapy: WFL for tasks assessed/performed Overall Cognitive Status: Within Functional Limits for tasks assessed                                          Exercises      General Comments        Pertinent Vitals/Pain Pain Assessment Pain Assessment: Faces Faces Pain Scale: Hurts little more Pain Location: L knee Pain Descriptors / Indicators: Aching, Sore Pain Intervention(s): Limited activity within patient's tolerance, Monitored during session, Premedicated before session, Repositioned    Home Living                          Prior Function            PT Goals (  current goals can now be found in the care plan section) Acute Rehab PT Goals Patient Stated Goal: Regain IND PT Goal Formulation: With patient Time For Goal Achievement: 04/29/23 Potential to Achieve Goals: Fair Progress towards PT goals: Progressing toward goals    Frequency    7X/week      PT Plan Current plan remains appropriate    Co-evaluation              AM-PAC PT "6 Clicks" Mobility   Outcome Measure  Help needed turning from your back to your side while in a flat bed without using bedrails?: A Lot Help needed moving from lying on your back to sitting on the side of a flat bed without using bedrails?: A Lot Help needed moving to and from a bed to a chair (including a wheelchair)?: A Lot Help  needed standing up from a chair using your arms (e.g., wheelchair or bedside chair)?: A Lot Help needed to walk in hospital room?: A Little Help needed climbing 3-5 steps with a railing? : Total 6 Click Score: 12    End of Session Equipment Utilized During Treatment: Gait belt Activity Tolerance: Patient tolerated treatment well Patient left: with call bell/phone within reach;Other (comment) [facility]) Nurse Communication: Mobility status PT Visit Diagnosis: Muscle weakness (generalized) (M62.81);Difficulty in walking, not elsewhere classified (R26.2)     Time: 1027-2536 PT Time Calculation (min) (ACUTE ONLY): 13 min  Charges:    $Therapeutic Activity: 8-22 mins PT General Charges $$ ACUTE PT VISIT: 1 Visit                     , PT  Acute Rehab Dept [facility]) [phone]  04/24/2023    [facility] 04/24/2023, 1:32 PM

## 2023-04-24 NOTE — Discharge Summary (Signed)
Patient ID: Ronald [name] MRN: [id number] DOB/AGE: Aug 04, 1951 72 y.o.  Admit date: 04/21/2023 Discharge date: 04/24/2023  Admission Diagnoses:  Principal Problem:   Unilateral primary osteoarthritis, left knee Active Problems:   Status post total left knee replacement   Discharge Diagnoses:  Same  Past Medical History:  Diagnosis Date   Anemia    Arthritis    Bell's palsy    CAD in native artery 06/04/2020   RCA infarct 2013.  S/p RCA and OM PCI   Diabetes mellitus type 2 in nonobese (HCC) 06/04/2020   Diabetes mellitus without complication (HCC)    Essential hypertension 06/04/2020   Floaters in visual field    Hypertension    Myocardial infarction [facility])    Parkinson's disease    Parkinson's disease 06/04/2020   Pure hypercholesterolemia 06/04/2020    Surgeries: Procedure(s): LEFT TOTAL KNEE ARTHROPLASTY on 04/21/2023   Consultants:   Discharged Condition: Improved  Hospital Course: Ronald [name] is an 72 y.o. male who was admitted 04/21/2023 for operative treatment ofUnilateral primary osteoarthritis, left knee. Patient has severe unremitting pain that affects sleep, daily activities, and work/hobbies. After pre-op clearance the patient was taken to the operating room on 04/21/2023 and underwent  Procedure(s): LEFT TOTAL KNEE ARTHROPLASTY.    Patient was given perioperative antibiotics:  Anti-infectives (From admission, onward)    Start     Dose/Rate Route Frequency Ordered Stop   04/21/23 1700  ceFAZolin (ANCEF) IVPB 1 g/50 mL premix        1 g 100 mL/hr over 30 Minutes Intravenous Every 6 hours 04/21/23 1520 04/21/23 2252   04/21/23 0745  ceFAZolin (ANCEF) IVPB 2g/100 mL premix        2 g 200 mL/hr over 30 Minutes Intravenous On call to O.R. 04/21/23 5638 04/21/23 1152        Patient was given sequential compression devices, early ambulation, and chemoprophylaxis to prevent DVT.  Patient benefited maximally from hospital stay and there were no  complications.    Recent vital signs: Patient Vitals for the past 24 hrs:  BP Temp Temp src Pulse Resp SpO2  04/24/23 0541 (!) 98/51 98.6 F (37 C) Oral 65 17 98 %  04/23/23 2235 (!) 94/53 99.4 F (37.4 C) Oral 69 -- 97 %  04/23/23 1409 (!) 111/50 98.2 F (36.8 C) -- 68 16 98 %     Recent laboratory studies:  Recent Labs    04/22/23 0408  WBC 8.0  HGB 9.5*  HCT 30.3*  PLT 173  NA 131*  K 4.9  CL 102  CO2 22  BUN 22  CREATININE 1.18  GLUCOSE 232*  CALCIUM 8.3*     Discharge Medications:   Allergies as of 04/24/2023       Reactions   Sulfamethoxazole-trimethoprim Swelling   Pt stated his lips became swollen        Medication List     TAKE these medications    aspirin 81 MG chewable tablet Chew 1 tablet (81 mg total) by mouth 2 (two) times daily. What changed: when to take this   carbidopa-levodopa 25-100 MG tablet Commonly known as: SINEMET IR Take by mouth 2 pills at 9 AM, 1 pill at 12, 2 pills at 3 PM, 1 pill at 6 PM, and 2 pills at 9 PM daily.   lisinopril 5 MG tablet Commonly known as: ZESTRIL Take 1 tablet by mouth once daily   metoprolol succinate 25 MG 24 hr tablet Commonly known as: TOPROL-XL Take 25 mg  by mouth daily.   naproxen sodium 220 MG tablet Commonly known as: ALEVE Take 220 mg by mouth 2 (two) times daily as needed (pain).   oxyCODONE 5 MG immediate release tablet Commonly known as: Oxy IR/ROXICODONE Take 1 tablet (5 mg total) by mouth every 6 (six) hours as needed for moderate pain (pain score 4-6).   rosuvastatin 20 MG tablet Commonly known as: CRESTOR Take 1 tablet by mouth once daily   sertraline 50 MG tablet Commonly known as: ZOLOFT Take 50 mg by mouth daily at 6 PM.               Durable Medical Equipment  (From admission, onward)           Start     Ordered   04/21/23 1521  DME 3 n 1  Once        04/21/23 1520   04/21/23 1521  DME Walker rolling  Once       Question Answer Comment  Walker: With 5  Inch Wheels   Patient needs a walker to treat with the following condition Status post total left knee replacement      04/21/23 1520            Diagnostic Studies: DG Knee Left Port  Result Date: 04/21/2023 CLINICAL DATA:  Left knee replacement. EXAM: PORTABLE LEFT KNEE - 1-2 VIEW COMPARISON:  None Available. FINDINGS: Left knee arthroplasty in expected alignment. No periprosthetic lucency or fracture. Recent postsurgical change includes air and edema in the soft tissues and joint space. Anterior skin staples in place. IMPRESSION: Left knee arthroplasty without immediate postoperative complication. Electronically Signed   By: Narda Rutherford M.D.   On: 04/21/2023 14:58    Disposition: Discharge disposition: 06-Home-Health Care Svc          Follow-up Information     Kathryne Hitch, MD Follow up in 2 week(s).   Specialty: Orthopedic Surgery Contact information: [address] [place] [address] [phone]                  Signed: Richardean Canal 04/24/2023, 8:03 AM

## 2023-04-24 NOTE — Progress Notes (Signed)
PT TX NOTE  04/24/23 1500  PT Visit Information  Last PT Received On 04/24/23  Assistance Needed Pt seen for gait and stair training this pm; wife present for session. Pt and wife feel confident about d/c today, dtr will be available to assist today as well; pt should wear KI for stairs at home, pt/wife and RN aware.   History of Present Illness Pt s/p L TKR and with hx of DM, Bells Palsy, MI, back surgery and Parkinsons  Subjective Data  Patient Stated Goal Regain IND  Precautions  Precautions Fall  Precaution Comments Premed for pain and Parkinson's  Required Braces or Orthoses Knee Immobilizer - Left  Knee Immobilizer - Left Other (comment) (utilized KI for stairs)  Restrictions  LLE Weight Bearing WBAT  Pain Assessment  Pain Assessment Faces  Faces Pain Scale 4  Pain Location L knee  Pain Descriptors / Indicators Aching;Sore  Pain Intervention(s) Limited activity within patient's tolerance;Monitored during session;Premedicated before session  Cognition  Arousal/Alertness Awake/alert  Behavior During Therapy WFL for tasks assessed/performed  Overall Cognitive Status Within Functional Limits for tasks assessed  Transfers  Overall transfer level Needs assistance  Equipment used Rolling walker (2 wheels)  Transfers Sit to/from Stand  Sit to Stand Min assist  General transfer comment cues for LE management and use of UEs to self assist.  Physical assist to bring wt up and fwd and to balance in standing.  Ambulation/Gait  Ambulation/Gait assistance Min assist;Min guard  Gait Distance (Feet) 45 Feet  Assistive device Rolling walker (2 wheels)  Gait Pattern/deviations Step-to pattern;Decreased step length - right;Decreased step length - left;Trunk flexed  General Gait Details INcreased time with cues for sequence, posture and position from RW.  Demies dizziness  Stairs Yes  Stairs assistance Min assist  Stair Management One rail Left;Step to pattern;Sideways  Number of Stairs   (up 2 and down 3 steps)  General stair comments cues for sequence, terminal knee extension; min assist to steady. wife present,  feels confident with assisting at home  Balance  Overall balance assessment Needs assistance  Sitting-balance support No upper extremity supported;Feet supported  Sitting balance-Leahy Scale Fair  Standing balance support Bilateral upper extremity supported;During functional activity;Reliant on assistive device for balance  Standing balance-Leahy Scale Poor  PT - End of Session  Equipment Utilized During Treatment Gait belt;Right knee immobilizer  Activity Tolerance Patient tolerated treatment well  Patient left Other (comment);with call bell/phone within reach;with family/visitor present (on BSC)   PT - Assessment/Plan  PT Plan Current plan remains appropriate  PT Visit Diagnosis Muscle weakness (generalized) (M62.81);Difficulty in walking, not elsewhere classified (R26.2)  PT Frequency (ACUTE ONLY) 7X/week  Follow Up Recommendations Follow physician's recommendations for discharge plan and follow up therapies  Assistance recommended at discharge Frequent or constant Supervision/Assistance  Patient can return home with the following A lot of help with walking and/or transfers;A little help with bathing/dressing/bathroom;Assistance with cooking/housework;Assist for transportation;Help with stairs or ramp for entrance  PT equipment None recommended by PT  AM-PAC PT "6 Clicks" Mobility Outcome Measure (Version 2)  Help needed turning from your back to your side while in a flat bed without using bedrails? 2  Help needed moving from lying on your back to sitting on the side of a flat bed without using bedrails? 2  Help needed moving to and from a bed to a chair (including a wheelchair)? 2  Help needed standing up from a chair using your arms (e.g., wheelchair or bedside chair)?  3  Help needed to walk in hospital room? 3  Help needed climbing 3-5 steps with a  railing?  4  6 Click Score 16  Consider Recommendation of Discharge To: Home with [facility]  PT Goal Progression  Progress towards PT goals Progressing toward goals  Acute Rehab PT Goals  PT Goal Formulation With patient  Time For Goal Achievement 04/29/23  PT Time Calculation  PT Start Time (ACUTE ONLY) 1513  PT Stop Time (ACUTE ONLY) 1533  PT Time Calculation (min) (ACUTE ONLY) 20 min  PT General Charges  $$ ACUTE PT VISIT 1 Visit  PT Treatments  $Gait Training 8-22 mins

## 2023-04-26 ENCOUNTER — Ambulatory Visit (HOSPITAL_BASED_OUTPATIENT_CLINIC_OR_DEPARTMENT_OTHER): Payer: Medicare HMO | Admitting: Cardiovascular Disease

## 2023-04-26 ENCOUNTER — Encounter (HOSPITAL_COMMUNITY): Payer: Self-pay | Admitting: Orthopaedic Surgery

## 2023-05-04 ENCOUNTER — Other Ambulatory Visit: Payer: Self-pay

## 2023-05-04 ENCOUNTER — Ambulatory Visit (INDEPENDENT_AMBULATORY_CARE_PROVIDER_SITE_OTHER): Payer: Medicare HMO | Admitting: Orthopaedic Surgery

## 2023-05-04 DIAGNOSIS — Z96652 Presence of left artificial knee joint: Secondary | ICD-10-CM

## 2023-05-04 MED ORDER — OXYCODONE HCL 5 MG PO TABS: 5.0000 mg | ORAL_TABLET | Freq: Four times a day (QID) | ORAL | 0 refills | Status: DC | PRN

## 2023-05-04 NOTE — Progress Notes (Signed)
The patient comes in today for his first postoperative visit status post a left knee replacement.  He is 72 years old.  He was originally scheduled for staying just 1 night for observation but we had to keep him for several nights given the severity of his Parkinson disease and he was such a fall risk and had balance and coordination issues that we cannot discharge him until he maximize therapy in the hospital.  This was medically necessary for him to stay extra nights and this was well-documented from his hospital notes and from physical therapy as well.  He is doing better overall.  He has home therapy coming to his house.  He is ambulating with a walker and is ready to transition to outpatient physical therapy.  Examination of his left knee shows the staples have been removed and Steri-Strips applied.  His extension lacks full extension by about 3 degrees but I can flex him to 90 degrees.  His calf is soft.  We will work on getting him set up for outpatient physical therapy.  He may need another week of home health therapy until outpatient therapy can be set up.  He can go back to just a baby aspirin daily.  I did refill his oxycodone.  We will see him back in 4 weeks to see how he is doing from a clinical range of motion standpoint but no x-rays are needed.

## 2023-05-08 ENCOUNTER — Other Ambulatory Visit: Payer: Self-pay | Admitting: Neurology

## 2023-05-16 ENCOUNTER — Ambulatory Visit: Payer: Medicare HMO | Attending: Orthopaedic Surgery

## 2023-05-16 DIAGNOSIS — Z96651 Presence of right artificial knee joint: Secondary | ICD-10-CM | POA: Diagnosis present

## 2023-05-16 DIAGNOSIS — M6281 Muscle weakness (generalized): Secondary | ICD-10-CM | POA: Insufficient documentation

## 2023-05-16 DIAGNOSIS — R293 Abnormal posture: Secondary | ICD-10-CM | POA: Insufficient documentation

## 2023-05-16 DIAGNOSIS — R29818 Other symptoms and signs involving the nervous system: Secondary | ICD-10-CM | POA: Insufficient documentation

## 2023-05-16 DIAGNOSIS — R2681 Unsteadiness on feet: Secondary | ICD-10-CM | POA: Insufficient documentation

## 2023-05-16 DIAGNOSIS — R2689 Other abnormalities of gait and mobility: Secondary | ICD-10-CM | POA: Insufficient documentation

## 2023-05-16 DIAGNOSIS — Z96652 Presence of left artificial knee joint: Secondary | ICD-10-CM | POA: Diagnosis not present

## 2023-05-16 NOTE — Therapy (Signed)
OUTPATIENT PHYSICAL THERAPY LOWER EXTREMITY EVALUATION   Patient Name: Ronald [name] MRN: [id number] DOB:1950/10/06, 72 y.o., male Today's Date: 05/17/2023  END OF SESSION:  PT End of Session - 05/17/23 9562     Visit Number 1    Number of Visits 12    Date for PT Re-Evaluation 07/11/23    Progress Note Due on Visit 10    PT Start Time 1530    PT Stop Time 1615    PT Time Calculation (min) 45 min    Activity Tolerance Patient tolerated treatment well    Behavior During Therapy [facility] for tasks assessed/performed             Past Medical History:  Diagnosis Date   Anemia    Arthritis    Bell's palsy    CAD in native artery 06/04/2020   RCA infarct 2013.  S/p RCA and OM PCI   Diabetes mellitus type 2 in nonobese (HCC) 06/04/2020   Diabetes mellitus without complication (HCC)    Essential hypertension 06/04/2020   Floaters in visual field    Hypertension    Myocardial infarction [facility])    Parkinson's disease    Parkinson's disease 06/04/2020   Pure hypercholesterolemia 06/04/2020   Past Surgical History:  Procedure Laterality Date   APPENDECTOMY     back epidural   08/2017   BACK SURGERY     CORONARY ANGIOPLASTY WITH STENT PLACEMENT  08/19/2012   KNEE SURGERY     TOTAL KNEE ARTHROPLASTY Left 04/21/2023   Procedure: LEFT TOTAL KNEE ARTHROPLASTY;  Surgeon: Kathryne Hitch, MD;  Location: [facility];  Service: Orthopedics;  Laterality: Left;   Patient Active Problem List   Diagnosis Date Noted   Status post total left knee replacement 04/21/2023   High risk medication use 10/28/2021   Olecranon bursitis, left elbow 04/21/2021   Personal history of COVID-19 04/13/2021   Primary osteoarthritis, 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   Essential hypertension 06/04/2020   Pure hypercholesterolemia 06/04/2020   CAD in native artery 06/04/2020   Parkinson's disease 06/04/2020   Diabetes mellitus type 2 in nonobese (HCC) 06/04/2020   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    Iron deficiency anemia 11/14/2019   Unilateral primary osteoarthritis, left knee 04/01/2019   Chronic left-sided low back pain with left-sided sciatica 04/01/2019   Lumbar radiculopathy 08/21/2017   Chronic pain syndrome 08/04/2017   Facet arthropathy 08/04/2017   Lumbar paraspinal muscle spasm 08/04/2017   Anemia of chronic disease 07/03/2017   Dizziness 11/10/2016   Hyperlipidemia, unspecified 10/27/2016   Other intervertebral disc degeneration, lumbar region 06/30/2016   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 heart with stable angina pectoris (HCC) 02/26/2016   SOB (shortness of breath) 02/26/2016   Old MI (myocardial infarction) 02/26/2016   OAB (overactive bladder) 09/05/2013   Ischemic heart disease 09/21/2012   Bell's palsy 06/14/2012    PCP: Woodroe Chen, MD  REFERRING PROVIDER: Doneen Poisson, MD  REFERRING DIAG: s/p L TKA due to osteoarthritis L knee  THERAPY DIAG:  Other abnormalities of gait and mobility  Status post total right knee replacement  Unsteadiness on feet  Muscle weakness (generalized)  Abnormal posture  Rationale for Evaluation and Treatment: Rehabilitation  ONSET DATE: 04/21/23  SUBJECTIVE:   SUBJECTIVE STATEMENT: Taking oxicodone after I work out , wants to get balance back to play golf  PERTINENT HISTORY: Degenerative changes L knee, underwent elective TKA PAIN:  Are you having pain? Yes: NPRS scale: 0 to 5/10 Pain location: L  knee Pain description: hurts with activity Aggravating factors: gait, therex Relieving factors: pain pills, ice  PRECAUTIONS: Fall  RED FLAGS: None   WEIGHT BEARING RESTRICTIONS: No  FALLS:  Has patient fallen in last 6 months? Yes. Number of falls multiple  LIVING ENVIRONMENT: Lives with: lives with their spouse Lives in: House/apartment Stairs: No Has following equipment at home: Single point cane and Environmental consultant - 2 wheeled  OCCUPATION: retired  PLOF: Independent with basic  ADLs, Independent with household mobility with device, Independent with transfers, Requires assistive device for independence, and Needs assistance with homemaking  PATIENT GOALS: return to gait with cane  NEXT MD VISIT: mid september  OBJECTIVE:   DIAGNOSTIC FINDINGS: na  PATIENT SURVEYS:  KOOS 12 72.9%  COGNITION: Overall cognitive status: Within functional limits for tasks assessed     SENSATION: Light touch: Impaired L plantar foot  EDEMA:  Mild L knee ant  MUSCLE LENGTH: Hamstrings: Right -25 deg; Left -30 deg  POSTURE: in standing weight bearing on B forefeet, forward lean, B hips and knees flexed slightly, thoracic kyphosis noted  PALPATION: Edematous L ant knee primarily, well healing scar L ant knee, raised, normal color, some warmth L knee as expected, patellar mobility normal  LOWER EXTREMITY ROM:  AA ROM Right eval Left eval  Hip flexion    Hip extension    Hip abduction    Hip adduction    Hip internal rotation    Hip external rotation    Knee flexion  122  Knee extension 0 -6  Ankle dorsiflexion    Ankle plantarflexion    Ankle inversion    Ankle eversion     (Blank rows = wfl)  LOWER EXTREMITY MMT: L SLR no lag, seated long arc quad L -22  MMT Right eval Left eval  Hip flexion 4+ 4-  Hip extension    Hip abduction    Hip adduction    Hip internal rotation    Hip external rotation    Knee flexion 4+ 4  Knee extension 4+ 4  Ankle dorsiflexion 3- 3-  Ankle plantarflexion 3- 2+  Ankle inversion    Ankle eversion     (Blank rows = not tested)  LOWER EXTREMITY SPECIAL TESTS:  NA  FUNCTIONAL TESTS:  30 seconds chair stand test 10 reps 10 meter walk test: 0.6 m/sec  GAIT: Distance walked: 90' Assistive device utilized: Environmental consultant - 2 wheeled Level of assistance: SBA Comments: B foot drop/slap, trendelenberg L, more pronounced when fatigued, flexed posture, narrow support, with near scissoring.   TODAY'S TREATMENT:                                                                                                                               DATE: 05/16/23  Evaluation, reviewed his current home program with home health, which included standing toe raises, standing hip abd, ext, and flexion, mini squats Advised to perform supine SLR 15 reps, as this  ex was quite difficult for him  PATIENT EDUCATION:  Education details: POC, goals Person educated: Patient Education method: Explanation, Demonstration, and Tactile cues Education comprehension: verbalized understanding  HOME EXERCISE PROGRAM: TBD  ASSESSMENT:  CLINICAL IMPRESSION: Patient is a 72 y.o. male who was seen today for physical therapy evaluation and treatment for s/p L TKA. He also has co morbidities for Parkinsons and for lumbar stenosis with chronic L LE radiculopathy.  He is nearly 4 weeks post op from his knee replacement. Has regained his flexion ROM very well so far, limited with L knee extension passive ROM , had difficulty performing SLR but his lag of 6 degrees matched his PROM for L knee ext. He also demonstrates labored gait from B foot drop, L trendelenburg, and near scissoring, noted more pronounced deficits with fatigue at 81' with gait. Also he c/o dizziness after 30 sec sit to stand test, checked his blood pressure and it was 86/58. Provided him with water and after he rested his dizziness was resolved.  Will benefit from skilled PT to address his deficits and recover his function L knee.  Prior to surgery pt walked with st. Cane.    OBJECTIVE IMPAIRMENTS: Abnormal gait, decreased activity tolerance, decreased balance, decreased coordination, decreased endurance, decreased mobility, difficulty walking, decreased ROM, decreased strength, increased edema, impaired flexibility, impaired sensation, postural dysfunction, and pain.   ACTIVITY LIMITATIONS: carrying, lifting, bending, standing, squatting, stairs, transfers, and locomotion level  PARTICIPATION  LIMITATIONS: meal prep, cleaning, laundry, shopping, and community activity  PERSONAL FACTORS: Age, Behavior pattern, Education, Past/current experiences, Time since onset of injury/illness/exacerbation, and 3+ comorbidities: parkinson's, spinal stenosis, DM, Htn, h/o falls  are also affecting patient's functional outcome.   REHAB POTENTIAL: Good  CLINICAL DECISION MAKING: Evolving/moderate complexity  EVALUATION COMPLEXITY: Moderate   GOALS: Goals reviewed with patient? Yes  SHORT TERM GOALS: Target date: 2 weeks 05/30/23 I HEP Baseline: Goal status: INITIAL   LONG TERM GOALS: Target date: 07/11/23: 8 weeks  Gait speed, increase from 0.6 m/sec to 1 m/sec, safely with LAD for improved community ambulation Baseline:  Goal status: INITIAL  2.  Gait distance improve from 66' to over 300' without fatigue, with LAD for improved household and community ambulation Baseline:  Goal status: INITIAL  3.  30 x sit to stand improve from 10 reps to 13 reps Baseline:  Goal status: INITIAL  4.  L  knee ext improve from -6 to -3, maintain L knee flexion 122 or greater Baseline:  Goal status: INITIAL  5.  Strength improve L quads to 5/5 for improved gait tolerance and transfers from multiple surfaces Baseline: 4/5 Goal status: INITIAL   PLAN:  PT FREQUENCY: 2x/week  PT DURATION: 8 weeks  PLANNED INTERVENTIONS: Therapeutic exercises, Therapeutic activity, Neuromuscular re-education, Balance training, Gait training, Patient/Family education, Self Care, and Joint mobilization  PLAN FOR NEXT SESSION: initiate cardiovascular machines, gross LE strengthening to tolerance   Alger Kerstein L Maylea Soria, PT, DPT, OCS 05/17/2023, 10:15 AM

## 2023-05-17 ENCOUNTER — Other Ambulatory Visit: Payer: Self-pay

## 2023-05-19 ENCOUNTER — Ambulatory Visit: Payer: Medicare HMO

## 2023-05-19 DIAGNOSIS — R2689 Other abnormalities of gait and mobility: Secondary | ICD-10-CM

## 2023-05-19 DIAGNOSIS — R293 Abnormal posture: Secondary | ICD-10-CM

## 2023-05-19 DIAGNOSIS — Z96651 Presence of right artificial knee joint: Secondary | ICD-10-CM

## 2023-05-19 DIAGNOSIS — M6281 Muscle weakness (generalized): Secondary | ICD-10-CM

## 2023-05-19 DIAGNOSIS — R29818 Other symptoms and signs involving the nervous system: Secondary | ICD-10-CM

## 2023-05-19 DIAGNOSIS — R2681 Unsteadiness on feet: Secondary | ICD-10-CM

## 2023-05-19 NOTE — Therapy (Signed)
OUTPATIENT PHYSICAL THERAPY TREATMENT   Patient Name: Ronald [name] MRN: [id number] DOB:03-01-1951, 72 y.o., male Today's Date: 05/19/2023  END OF SESSION:  PT End of Session - 05/19/23 1111     Visit Number 2    Number of Visits 12    Date for PT Re-Evaluation 07/11/23    Authorization Type [employer]    Progress Note Due on Visit 10    PT Start Time 1105    PT Stop Time 1147    PT Time Calculation (min) 42 min    Activity Tolerance Patient tolerated treatment well    Behavior During Therapy WFL for tasks assessed/performed              Past Medical History:  Diagnosis Date   Anemia    Arthritis    Bell's palsy    CAD in native artery 06/04/2020   RCA infarct 2013.  S/p RCA and OM PCI   Diabetes mellitus type 2 in nonobese (HCC) 06/04/2020   Diabetes mellitus without complication (HCC)    Essential hypertension 06/04/2020   Floaters in visual field    Hypertension    Myocardial infarction [facility])    Parkinson's disease    Parkinson's disease 06/04/2020   Pure hypercholesterolemia 06/04/2020   Past Surgical History:  Procedure Laterality Date   APPENDECTOMY     back epidural   08/2017   BACK SURGERY     CORONARY ANGIOPLASTY WITH STENT PLACEMENT  08/19/2012   KNEE SURGERY     TOTAL KNEE ARTHROPLASTY Left 04/21/2023   Procedure: LEFT TOTAL KNEE ARTHROPLASTY;  Surgeon: Kathryne Hitch, MD;  Location: [facility];  Service: Orthopedics;  Laterality: Left;   Patient Active Problem List   Diagnosis Date Noted   Status post total left knee replacement 04/21/2023   High risk medication use 10/28/2021   Olecranon bursitis, left elbow 04/21/2021   Personal history of COVID-19 04/13/2021   Primary osteoarthritis, 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   Essential hypertension 06/04/2020   Pure hypercholesterolemia 06/04/2020   CAD in native artery 06/04/2020   Parkinson's disease 06/04/2020   Diabetes mellitus type 2 in nonobese (HCC) 06/04/2020   Mild major depression  (HCC) 04/01/2020   Iron deficiency anemia 11/14/2019   Unilateral primary osteoarthritis, left knee 04/01/2019   Chronic left-sided low back pain with left-sided sciatica 04/01/2019   Lumbar radiculopathy 08/21/2017   Chronic pain syndrome 08/04/2017   Facet arthropathy 08/04/2017   Lumbar paraspinal muscle spasm 08/04/2017   Anemia of chronic disease 07/03/2017   Dizziness 11/10/2016   Hyperlipidemia, unspecified 10/27/2016   Other intervertebral disc degeneration, lumbar region 06/30/2016   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 heart with stable angina pectoris (HCC) 02/26/2016   SOB (shortness of breath) 02/26/2016   Old MI (myocardial infarction) 02/26/2016   OAB (overactive bladder) 09/05/2013   Ischemic heart disease 09/21/2012   Bell's palsy 06/14/2012    PCP: Woodroe Chen, MD  REFERRING PROVIDER: Doneen Poisson, MD  REFERRING DIAG: s/p L TKA due to osteoarthritis L knee  THERAPY DIAG:  Other abnormalities of gait and mobility  Status post total right knee replacement  Unsteadiness on feet  Muscle weakness (generalized)  Abnormal posture  Other symptoms and signs involving the nervous system  Rationale for Evaluation and Treatment: Rehabilitation  ONSET DATE: 04/21/23  SUBJECTIVE:   SUBJECTIVE STATEMENT: Pt denies any pain today, sometimes his knee gets sore  PERTINENT HISTORY: Degenerative changes L knee, underwent elective TKA PAIN:  Are you having  pain? Yes: NPRS scale: 0 /10 Pain location: L knee Pain description: hurts with activity Aggravating factors: gait, therex Relieving factors: pain pills, ice  PRECAUTIONS: Fall  RED FLAGS: None   WEIGHT BEARING RESTRICTIONS: No  FALLS:  Has patient fallen in last 6 months? Yes. Number of falls multiple  LIVING ENVIRONMENT: Lives with: lives with their spouse Lives in: House/apartment Stairs: No Has following equipment at home: Single point cane and Environmental consultant - 2  wheeled  OCCUPATION: retired  PLOF: Independent with basic ADLs, Independent with household mobility with device, Independent with transfers, Requires assistive device for independence, and Needs assistance with homemaking  PATIENT GOALS: return to gait with cane  NEXT MD VISIT: mid september  OBJECTIVE:   DIAGNOSTIC FINDINGS: na  PATIENT SURVEYS:  KOOS 12 72.9%  COGNITION: Overall cognitive status: Within functional limits for tasks assessed     SENSATION: Light touch: Impaired L plantar foot  EDEMA:  Mild L knee ant  MUSCLE LENGTH: Hamstrings: Right -25 deg; Left -30 deg  POSTURE: in standing weight bearing on B forefeet, forward lean, B hips and knees flexed slightly, thoracic kyphosis noted  PALPATION: Edematous L ant knee primarily, well healing scar L ant knee, raised, normal color, some warmth L knee as expected, patellar mobility normal  LOWER EXTREMITY ROM:  AA ROM Right eval Left eval  Hip flexion    Hip extension    Hip abduction    Hip adduction    Hip internal rotation    Hip external rotation    Knee flexion  122  Knee extension 0 -6  Ankle dorsiflexion    Ankle plantarflexion    Ankle inversion    Ankle eversion     (Blank rows = wfl)  LOWER EXTREMITY MMT: L SLR no lag, seated long arc quad L -22  MMT Right eval Left eval  Hip flexion 4+ 4-  Hip extension    Hip abduction    Hip adduction    Hip internal rotation    Hip external rotation    Knee flexion 4+ 4  Knee extension 4+ 4  Ankle dorsiflexion 3- 3-  Ankle plantarflexion 3- 2+  Ankle inversion    Ankle eversion     (Blank rows = not tested)  LOWER EXTREMITY SPECIAL TESTS:  NA  FUNCTIONAL TESTS:  30 seconds chair stand test 10 reps 10 meter walk test: 0.6 m/sec  GAIT: Distance walked: 90' Assistive device utilized: Environmental consultant - 2 wheeled Level of assistance: SBA Comments: B foot drop/slap, trendelenberg L, more pronounced when fatigued, flexed posture, narrow support,  with near scissoring.   TODAY'S TREATMENT:                                                                                                                              DATE:  05/19/23 Nustep L4x81min Standing hip abduction x 10 B Standing hip extension x 10 B Heel raise x 10 B very challenging with  R LE Standing marches x 10 B Seated R LAQ x 10  Seated ball squeeze 10x3" Seated hamstring curls RTB x 10  Manual therapy: Scar mobilization to distal scar   05/16/23  Evaluation, reviewed his current home program with home health, which included standing toe raises, standing hip abd, ext, and flexion, mini squats Advised to perform supine SLR 15 reps, as this ex was quite difficult for him  PATIENT EDUCATION:  Education details: HEP update- see below Person educated: Patient Education method: Explanation, Demonstration, and Tactile cues Education comprehension: verbalized understanding  HOME EXERCISE PROGRAM: Access Code: [email or web address] URL: [email or web address] Date: 05/19/2023 Prepared by: Verta Ellen  Exercises - Seated Hamstring Stretch with Chair  - 1 x daily - 7 x weekly - 3 sets - 30 seconds to 1 min hold - Seated Long Arc Quad  - 1 x daily - 7 x weekly - 3 sets - 10 reps - 3 sec hold - Seated Hip Adduction Squeeze with Ball  - 1 x daily - 7 x weekly - 3 sets - 10 reps - 3 sec hold  ASSESSMENT:  CLINICAL IMPRESSION: Patient showed good tolerance for the exercises today. He was limited with prolonged standing and supine positioning d/t his low back. He required postural cues throughout session to sit up straight. Cues needed for eccentric control with the hip abduction. Cues also to keep knees extended with hip extension. Presents with decreased scar mobility distally so worked on that for a bit. Will benefit from skilled PT to address his deficits and recover his function L knee.    OBJECTIVE IMPAIRMENTS: Abnormal gait, decreased activity tolerance, decreased  balance, decreased coordination, decreased endurance, decreased mobility, difficulty walking, decreased ROM, decreased strength, increased edema, impaired flexibility, impaired sensation, postural dysfunction, and pain.   ACTIVITY LIMITATIONS: carrying, lifting, bending, standing, squatting, stairs, transfers, and locomotion level  PARTICIPATION LIMITATIONS: meal prep, cleaning, laundry, shopping, and community activity  PERSONAL FACTORS: Age, Behavior pattern, Education, Past/current experiences, Time since onset of injury/illness/exacerbation, and 3+ comorbidities: parkinson's, spinal stenosis, DM, Htn, h/o falls  are also affecting patient's functional outcome.   REHAB POTENTIAL: Good  CLINICAL DECISION MAKING: Evolving/moderate complexity  EVALUATION COMPLEXITY: Moderate   GOALS: Goals reviewed with patient? Yes  SHORT TERM GOALS: Target date: 2 weeks 05/30/23 I HEP Baseline: Goal status: INITIAL   LONG TERM GOALS: Target date: 07/11/23: 8 weeks  Gait speed, increase from 0.6 m/sec to 1 m/sec, safely with LAD for improved community ambulation Baseline:  Goal status: INITIAL  2.  Gait distance improve from 107' to over 300' without fatigue, with LAD for improved household and community ambulation Baseline:  Goal status: INITIAL  3.  30 x sit to stand improve from 10 reps to 13 reps Baseline:  Goal status: INITIAL  4.  L  knee ext improve from -6 to -3, maintain L knee flexion 122 or greater Baseline:  Goal status: INITIAL  5.  Strength improve L quads to 5/5 for improved gait tolerance and transfers from multiple surfaces Baseline: 4/5 Goal status: INITIAL   PLAN:  PT FREQUENCY: 2x/week  PT DURATION: 8 weeks  PLANNED INTERVENTIONS: Therapeutic exercises, Therapeutic activity, Neuromuscular re-education, Balance training, Gait training, Patient/Family education, Self Care, and Joint mobilization  PLAN FOR NEXT SESSION: initiate cardiovascular machines, gross LE  strengthening to tolerance   Darleene Cleaver, PTA 05/19/2023, 11:59 AM

## 2023-05-24 ENCOUNTER — Ambulatory Visit: Payer: Medicare HMO | Attending: Orthopaedic Surgery

## 2023-05-24 DIAGNOSIS — M6281 Muscle weakness (generalized): Secondary | ICD-10-CM | POA: Insufficient documentation

## 2023-05-24 DIAGNOSIS — R2689 Other abnormalities of gait and mobility: Secondary | ICD-10-CM | POA: Diagnosis present

## 2023-05-24 DIAGNOSIS — R2681 Unsteadiness on feet: Secondary | ICD-10-CM | POA: Insufficient documentation

## 2023-05-24 DIAGNOSIS — Z96651 Presence of right artificial knee joint: Secondary | ICD-10-CM | POA: Diagnosis present

## 2023-05-24 DIAGNOSIS — R29818 Other symptoms and signs involving the nervous system: Secondary | ICD-10-CM | POA: Insufficient documentation

## 2023-05-24 DIAGNOSIS — R293 Abnormal posture: Secondary | ICD-10-CM | POA: Insufficient documentation

## 2023-05-24 NOTE — Therapy (Signed)
OUTPATIENT PHYSICAL THERAPY TREATMENT   Patient Name: Ronald [name] MRN: [id number] DOB:09-12-1951, 72 y.o., male Today's Date: 05/24/2023  END OF SESSION:  PT End of Session - 05/24/23 1101     Visit Number 3    Number of Visits 12    Date for PT Re-Evaluation 07/11/23    Authorization Type [employer]    Progress Note Due on Visit 10    PT Start Time 1020    PT Stop Time 1100    PT Time Calculation (min) 40 min    Activity Tolerance Patient tolerated treatment well    Behavior During Therapy WFL for tasks assessed/performed               Past Medical History:  Diagnosis Date   Anemia    Arthritis    Bell's palsy    CAD in native artery 06/04/2020   RCA infarct 2013.  S/p RCA and OM PCI   Diabetes mellitus type 2 in nonobese (HCC) 06/04/2020   Diabetes mellitus without complication (HCC)    Essential hypertension 06/04/2020   Floaters in visual field    Hypertension    Myocardial infarction [facility])    Parkinson's disease    Parkinson's disease 06/04/2020   Pure hypercholesterolemia 06/04/2020   Past Surgical History:  Procedure Laterality Date   APPENDECTOMY     back epidural   08/2017   BACK SURGERY     CORONARY ANGIOPLASTY WITH STENT PLACEMENT  08/19/2012   KNEE SURGERY     TOTAL KNEE ARTHROPLASTY Left 04/21/2023   Procedure: LEFT TOTAL KNEE ARTHROPLASTY;  Surgeon: Kathryne Hitch, MD;  Location: [facility];  Service: Orthopedics;  Laterality: Left;   Patient Active Problem List   Diagnosis Date Noted   Status post total left knee replacement 04/21/2023   High risk medication use 10/28/2021   Olecranon bursitis, left elbow 04/21/2021   Personal history of COVID-19 04/13/2021   Primary osteoarthritis, 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   Essential hypertension 06/04/2020   Pure hypercholesterolemia 06/04/2020   CAD in native artery 06/04/2020   Parkinson's disease 06/04/2020   Diabetes mellitus type 2 in nonobese (HCC) 06/04/2020   Mild major  depression (HCC) 04/01/2020   Iron deficiency anemia 11/14/2019   Unilateral primary osteoarthritis, left knee 04/01/2019   Chronic left-sided low back pain with left-sided sciatica 04/01/2019   Lumbar radiculopathy 08/21/2017   Chronic pain syndrome 08/04/2017   Facet arthropathy 08/04/2017   Lumbar paraspinal muscle spasm 08/04/2017   Anemia of chronic disease 07/03/2017   Dizziness 11/10/2016   Hyperlipidemia, unspecified 10/27/2016   Other intervertebral disc degeneration, lumbar region 06/30/2016   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 heart with stable angina pectoris (HCC) 02/26/2016   SOB (shortness of breath) 02/26/2016   Old MI (myocardial infarction) 02/26/2016   OAB (overactive bladder) 09/05/2013   Ischemic heart disease 09/21/2012   Bell's palsy 06/14/2012    PCP: Woodroe Chen, MD  REFERRING PROVIDER: Doneen Poisson, MD  REFERRING DIAG: s/p L TKA due to osteoarthritis L knee  THERAPY DIAG:  Other abnormalities of gait and mobility  Status post total right knee replacement  Unsteadiness on feet  Muscle weakness (generalized)  Abnormal posture  Other symptoms and signs involving the nervous system  Rationale for Evaluation and Treatment: Rehabilitation  ONSET DATE: 04/21/23  SUBJECTIVE:   SUBJECTIVE STATEMENT: Pt reports doing well, a bit more tired today (did not sleep well).  PERTINENT HISTORY: Degenerative changes L knee, underwent elective TKA PAIN:  Are you having pain? Yes: NPRS scale: 3/10 Pain location: L knee Pain description: hurts with activity Aggravating factors: gait, therex Relieving factors: pain pills, ice  PRECAUTIONS: Fall  RED FLAGS: None   WEIGHT BEARING RESTRICTIONS: No  FALLS:  Has patient fallen in last 6 months? Yes. Number of falls multiple  LIVING ENVIRONMENT: Lives with: lives with their spouse Lives in: House/apartment Stairs: No Has following equipment at home: Single point cane and  Environmental consultant - 2 wheeled  OCCUPATION: retired  PLOF: Independent with basic ADLs, Independent with household mobility with device, Independent with transfers, Requires assistive device for independence, and Needs assistance with homemaking  PATIENT GOALS: return to gait with cane  NEXT MD VISIT: mid september  OBJECTIVE:   DIAGNOSTIC FINDINGS: na  PATIENT SURVEYS:  KOOS 12 72.9%  COGNITION: Overall cognitive status: Within functional limits for tasks assessed     SENSATION: Light touch: Impaired L plantar foot  EDEMA:  Mild L knee ant  MUSCLE LENGTH: Hamstrings: Right -25 deg; Left -30 deg  POSTURE: in standing weight bearing on B forefeet, forward lean, B hips and knees flexed slightly, thoracic kyphosis noted  PALPATION: Edematous L ant knee primarily, well healing scar L ant knee, raised, normal color, some warmth L knee as expected, patellar mobility normal  LOWER EXTREMITY ROM:  AA ROM Right eval Left eval  Hip flexion    Hip extension    Hip abduction    Hip adduction    Hip internal rotation    Hip external rotation    Knee flexion  122  Knee extension 0 -6  Ankle dorsiflexion    Ankle plantarflexion    Ankle inversion    Ankle eversion     (Blank rows = wfl)  LOWER EXTREMITY MMT: L SLR no lag, seated long arc quad L -22  MMT Right eval Left eval  Hip flexion 4+ 4-  Hip extension    Hip abduction    Hip adduction    Hip internal rotation    Hip external rotation    Knee flexion 4+ 4  Knee extension 4+ 4  Ankle dorsiflexion 3- 3-  Ankle plantarflexion 3- 2+  Ankle inversion    Ankle eversion     (Blank rows = not tested)  LOWER EXTREMITY SPECIAL TESTS:  NA  FUNCTIONAL TESTS:  30 seconds chair stand test 10 reps 10 meter walk test: 0.6 m/sec  GAIT: Distance walked: 90' Assistive device utilized: Environmental consultant - 2 wheeled Level of assistance: SBA Comments: B foot drop/slap, trendelenberg L, more pronounced when fatigued, flexed posture, narrow  support, with near scissoring.   TODAY'S TREATMENT:                                                                                                                              DATE: 05/24/23 Bike L2x74min Seated L LAQ x 15 Supine Quad set 10x5" L Supine SLR x 10 L Supine LTR x 10  both ways Supine hip ADD ball squeeze 2x10 Supine heel slide with RTB x 12 L  05/19/23 Nustep L4x51min Standing hip abduction x 10 B Standing hip extension x 10 B Heel raise x 10 B very challenging with R LE Standing marches x 10 B Seated R LAQ x 10  Seated ball squeeze 10x3" Seated hamstring curls RTB x 10  Manual therapy: Scar mobilization to distal scar   05/16/23  Evaluation, reviewed his current home program with home health, which included standing toe raises, standing hip abd, ext, and flexion, mini squats Advised to perform supine SLR 15 reps, as this ex was quite difficult for him  PATIENT EDUCATION:  Education details: HEP update- see below Person educated: Patient Education method: Explanation, Demonstration, and Tactile cues Education comprehension: verbalized understanding  HOME EXERCISE PROGRAM: Access Code: [email or web address] URL: [email or web address] Date: 05/19/2023 Prepared by: Verta Ellen  Exercises - Seated Hamstring Stretch with Chair  - 1 x daily - 7 x weekly - 3 sets - 30 seconds to 1 min hold - Seated Long Arc Quad  - 1 x daily - 7 x weekly - 3 sets - 10 reps - 3 sec hold - Seated Hip Adduction Squeeze with Ball  - 1 x daily - 7 x weekly - 3 sets - 10 reps - 3 sec hold  ASSESSMENT:  CLINICAL IMPRESSION: Pt a little more fatigued coming in today so we toned down therex today. His back was a lot more flared up so we did mostly supine exercises and LTR which helped his back pain. Added HS curls to HEP with RTB. Will benefit from skilled PT to address his deficits and recover his function L knee.   OBJECTIVE IMPAIRMENTS: Abnormal gait, decreased activity tolerance,  decreased balance, decreased coordination, decreased endurance, decreased mobility, difficulty walking, decreased ROM, decreased strength, increased edema, impaired flexibility, impaired sensation, postural dysfunction, and pain.   ACTIVITY LIMITATIONS: carrying, lifting, bending, standing, squatting, stairs, transfers, and locomotion level  PARTICIPATION LIMITATIONS: meal prep, cleaning, laundry, shopping, and community activity  PERSONAL FACTORS: Age, Behavior pattern, Education, Past/current experiences, Time since onset of injury/illness/exacerbation, and 3+ comorbidities: parkinson's, spinal stenosis, DM, Htn, h/o falls  are also affecting patient's functional outcome.   REHAB POTENTIAL: Good  CLINICAL DECISION MAKING: Evolving/moderate complexity  EVALUATION COMPLEXITY: Moderate   GOALS: Goals reviewed with patient? Yes  SHORT TERM GOALS: Target date: 2 weeks 05/30/23 I HEP Baseline: Goal status: INITIAL   LONG TERM GOALS: Target date: 07/11/23: 8 weeks  Gait speed, increase from 0.6 m/sec to 1 m/sec, safely with LAD for improved community ambulation Baseline:  Goal status: INITIAL  2.  Gait distance improve from 66' to over 300' without fatigue, with LAD for improved household and community ambulation Baseline:  Goal status: INITIAL  3.  30 x sit to stand improve from 10 reps to 13 reps Baseline:  Goal status: INITIAL  4.  L  knee ext improve from -6 to -3, maintain L knee flexion 122 or greater Baseline:  Goal status: INITIAL  5.  Strength improve L quads to 5/5 for improved gait tolerance and transfers from multiple surfaces Baseline: 4/5 Goal status: INITIAL   PLAN:  PT FREQUENCY: 2x/week  PT DURATION: 8 weeks  PLANNED INTERVENTIONS: Therapeutic exercises, Therapeutic activity, Neuromuscular re-education, Balance training, Gait training, Patient/Family education, Self Care, and Joint mobilization  PLAN FOR NEXT SESSION: initiate cardiovascular machines,  gross LE strengthening to tolerance   Annakate Soulier L Abbygale Lapid, PTA 05/24/2023, 11:02 AM

## 2023-05-25 ENCOUNTER — Other Ambulatory Visit: Payer: Self-pay

## 2023-05-25 ENCOUNTER — Ambulatory Visit: Payer: Medicare HMO

## 2023-05-25 DIAGNOSIS — M6281 Muscle weakness (generalized): Secondary | ICD-10-CM

## 2023-05-25 DIAGNOSIS — R2689 Other abnormalities of gait and mobility: Secondary | ICD-10-CM | POA: Diagnosis not present

## 2023-05-25 DIAGNOSIS — Z96651 Presence of right artificial knee joint: Secondary | ICD-10-CM

## 2023-05-25 DIAGNOSIS — R2681 Unsteadiness on feet: Secondary | ICD-10-CM

## 2023-05-25 DIAGNOSIS — R293 Abnormal posture: Secondary | ICD-10-CM

## 2023-05-25 NOTE — Therapy (Signed)
OUTPATIENT PHYSICAL THERAPY TREATMENT   Patient Name: Ronald [name] MRN: [id number] DOB:[birth year], 72 y.o., male Today's Date: 05/25/2023  END OF SESSION:  PT End of Session - 05/25/23 1117     Visit Number 4    Number of Visits 12    Date for PT Re-Evaluation 07/11/23    Progress Note Due on Visit 10    PT Start Time 1015    PT Stop Time 1110    PT Time Calculation (min) 55 min                Past Medical History:  Diagnosis Date   Anemia    Arthritis    Bell's palsy    CAD in native artery 06/04/2020   RCA infarct 2013.  S/p RCA and OM PCI   Diabetes mellitus type 2 in nonobese (HCC) 06/04/2020   Diabetes mellitus without complication (HCC)    Essential hypertension 06/04/2020   Floaters in visual field    Hypertension    Myocardial infarction [facility])    Parkinson's disease    Parkinson's disease 06/04/2020   Pure hypercholesterolemia 06/04/2020   Past Surgical History:  Procedure Laterality Date   APPENDECTOMY     back epidural   08/2017   BACK SURGERY     CORONARY ANGIOPLASTY WITH STENT PLACEMENT  08/19/2012   KNEE SURGERY     TOTAL KNEE ARTHROPLASTY Left 04/21/2023   Procedure: LEFT TOTAL KNEE ARTHROPLASTY;  Surgeon: Kathryne Hitch, MD;  Location: [facility];  Service: Orthopedics;  Laterality: Left;   Patient Active Problem List   Diagnosis Date Noted   Status post total left knee replacement 04/21/2023   High risk medication use 10/28/2021   Olecranon bursitis, left elbow 04/21/2021   Personal history of COVID-19 04/13/2021   Primary osteoarthritis, 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   Essential hypertension 06/04/2020   Pure hypercholesterolemia 06/04/2020   CAD in native artery 06/04/2020   Parkinson's disease 06/04/2020   Diabetes mellitus type 2 in nonobese (HCC) 06/04/2020   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   Iron deficiency anemia 11/14/2019   Unilateral primary osteoarthritis, left knee 04/01/2019   Chronic left-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 04/01/2019   Lumbar radiculopathy 08/21/2017   Chronic pain syndrome 08/04/2017   Facet arthropathy 08/04/2017   Lumbar paraspinal muscle spasm 08/04/2017   Anemia of chronic disease 07/03/2017   Dizziness 11/10/2016   Hyperlipidemia, unspecified 10/27/2016   Other intervertebral disc degeneration, lumbar region 06/30/2016   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 heart with stable angina pectoris (HCC) 02/26/2016   SOB (shortness of breath) 02/26/2016   Old MI (myocardial infarction) 02/26/2016   OAB (overactive bladder) 09/05/2013   Ischemic heart disease 09/21/2012   Bell's palsy 06/14/2012    PCP: Woodroe Chen, MD  REFERRING PROVIDER: Doneen Poisson, MD  REFERRING DIAG: s/p L TKA due to osteoarthritis L knee  THERAPY DIAG:  Other abnormalities of gait and mobility  Status post total right knee replacement  Unsteadiness on feet  Muscle weakness (generalized)  Abnormal posture  Rationale for Evaluation and Treatment: Rehabilitation  ONSET DATE: 04/21/23  SUBJECTIVE:   SUBJECTIVE STATEMENT: Pt reports "sore" lower back , L knee feels good  PERTINENT HISTORY: Degenerative changes L knee, underwent elective TKA PAIN:  Are you having pain? Yes: NPRS scale: 3/10 Pain location: L knee Pain description: hurts with activity Aggravating factors: gait, therex Relieving factors: pain pills, ice  PRECAUTIONS: Fall  RED FLAGS: None   WEIGHT  BEARING RESTRICTIONS: No  FALLS:  Has patient fallen in last 6 months? Yes. Number of falls multiple  LIVING ENVIRONMENT: Lives with: lives with their spouse Lives in: House/apartment Stairs: No Has following equipment at home: Single point cane and Environmental consultant - 2 wheeled  OCCUPATION: retired  PLOF: Independent with basic ADLs, Independent with household mobility with device, Independent with transfers, Requires assistive device for independence, and Needs assistance with  homemaking  PATIENT GOALS: return to gait with cane  NEXT MD VISIT: mid september  OBJECTIVE:   DIAGNOSTIC FINDINGS: na  PATIENT SURVEYS:  KOOS 12 72.9%  COGNITION: Overall cognitive status: Within functional limits for tasks assessed     SENSATION: Light touch: Impaired L plantar foot  EDEMA:  Mild L knee ant  MUSCLE LENGTH: Hamstrings: Right -25 deg; Left -30 deg  POSTURE: in standing weight bearing on B forefeet, forward lean, B hips and knees flexed slightly, thoracic kyphosis noted  PALPATION: Edematous L ant knee primarily, well healing scar L ant knee, raised, normal color, some warmth L knee as expected, patellar mobility normal  LOWER EXTREMITY ROM:  AA ROM Right eval Left eval  Hip flexion    Hip extension    Hip abduction    Hip adduction    Hip internal rotation    Hip external rotation    Knee flexion  122  Knee extension 0 -6  Ankle dorsiflexion    Ankle plantarflexion    Ankle inversion    Ankle eversion     (Blank rows = wfl)  LOWER EXTREMITY MMT: L SLR no lag, seated long arc quad L -22  MMT Right eval Left eval  Hip flexion 4+ 4-  Hip extension    Hip abduction    Hip adduction    Hip internal rotation    Hip external rotation    Knee flexion 4+ 4  Knee extension 4+ 4  Ankle dorsiflexion 3- 3-  Ankle plantarflexion 3- 2+  Ankle inversion    Ankle eversion     (Blank rows = not tested)  LOWER EXTREMITY SPECIAL TESTS:  NA  FUNCTIONAL TESTS:  30 seconds chair stand test 10 reps 10 meter walk test: 0.6 m/sec  GAIT: Distance walked: 90' Assistive device utilized: Environmental consultant - 2 wheeled Level of assistance: SBA Comments: B foot drop/slap, trendelenberg L, more pronounced when fatigued, flexed posture, narrow support, with near scissoring.   TODAY'S TREATMENT:                                                                                                                              DATE: 05/25/23: Therapeutic exercise:  instructed in the following exercises designed to address b LE strength and stability and L knee flexibility: Nustep 6 min level 5 Supine, moist heat under lumbar region with physioball(green, 65cm) under thighs,for 15 reps each:  LTR Alt SLR B knee to chest(heels on ball) Bridging Supine hooklying for hip abd/ER 15x, blue band  Long arc quads 5#, alternating legs 10 reps, 3 sets  Standing for hip abduction, 15 x each at counter Attempted hip ext L but became weak, requested sit down, monitored BP at  BP 82/48  once sitting Provided with glass of water, then assisted patient into supine position.  Monitored BP after 5 min supine: 122/60 Assisted to sitting and monitored after 3 min, 104/60      05/24/23 Bike L2x79min Seated L LAQ x 15 Supine Quad set 10x5" L Supine SLR x 10 L Supine LTR x 10 both ways Supine hip ADD ball squeeze 2x10 Supine heel slide with RTB x 12 L  05/19/23 Nustep L4x62min Standing hip abduction x 10 B Standing hip extension x 10 B Heel raise x 10 B very challenging with R LE Standing marches x 10 B Seated R LAQ x 10  Seated ball squeeze 10x3" Seated hamstring curls RTB x 10  Manual therapy: Scar mobilization to distal scar   05/16/23  Evaluation, reviewed his current home program with home health, which included standing toe raises, standing hip abd, ext, and flexion, mini squats Advised to perform supine SLR 15 reps, as this ex was quite difficult for him  PATIENT EDUCATION:  Education details: HEP update- see below Person educated: Patient Education method: Explanation, Demonstration, and Tactile cues Education comprehension: verbalized understanding  HOME EXERCISE PROGRAM: Access Code: [email or web address] URL: [email or web address] Date: 05/19/2023 Prepared by: Verta Ellen  Exercises - Seated Hamstring Stretch with Chair  - 1 x daily - 7 x weekly - 3 sets - 30 seconds to 1 min hold - Seated Long Arc Quad  - 1 x daily - 7 x weekly - 3  sets - 10 reps - 3 sec hold - Seated Hip Adduction Squeeze with Ball  - 1 x daily - 7 x weekly - 3 sets - 10 reps - 3 sec hold  ASSESSMENT:  CLINICAL IMPRESSION: Pt recovering from L TKA.  Progressed with endurance and LE strength, stability ex.  When performing standing ex the patient became light headed, was hypotensive.  Recovered well enough to exit safely.  States he is addressing episodes of lower blood pressure with his PCP, neurologist.  Fatigue is a significant limitation in regard to his function.  Will benefit from skilled PT to address his deficits and maximize his outcome  L knee.   OBJECTIVE IMPAIRMENTS: Abnormal gait, decreased activity tolerance, decreased balance, decreased coordination, decreased endurance, decreased mobility, difficulty walking, decreased ROM, decreased strength, increased edema, impaired flexibility, impaired sensation, postural dysfunction, and pain.   ACTIVITY LIMITATIONS: carrying, lifting, bending, standing, squatting, stairs, transfers, and locomotion level  PARTICIPATION LIMITATIONS: meal prep, cleaning, laundry, shopping, and community activity  PERSONAL FACTORS: Age, Behavior pattern, Education, Past/current experiences, Time since onset of injury/illness/exacerbation, and 3+ comorbidities: parkinson's, spinal stenosis, DM, Htn, h/o falls  are also affecting patient's functional outcome.   REHAB POTENTIAL: Good  CLINICAL DECISION MAKING: Evolving/moderate complexity  EVALUATION COMPLEXITY: Moderate   GOALS: Goals reviewed with patient? Yes  SHORT TERM GOALS: Target date: 2 weeks 05/30/23 I HEP Baseline: Goal status: INITIAL   LONG TERM GOALS: Target date: 07/11/23: 8 weeks  Gait speed, increase from 0.6 m/sec to 1 m/sec, safely with LAD for improved community ambulation Baseline:  Goal status: INITIAL  2.  Gait distance improve from 69' to over 300' without fatigue, with LAD for improved household and community ambulation Baseline:   Goal status: INITIAL  3.  30 x sit to stand improve from 10  reps to 13 reps Baseline:  Goal status: INITIAL  4.  L  knee ext improve from -6 to -3, maintain L knee flexion 122 or greater Baseline:  Goal status: INITIAL  5.  Strength improve L quads to 5/5 for improved gait tolerance and transfers from multiple surfaces Baseline: 4/5 Goal status: INITIAL   PLAN:  PT FREQUENCY: 2x/week  PT DURATION: 8 weeks  PLANNED INTERVENTIONS: Therapeutic exercises, Therapeutic activity, Neuromuscular re-education, Balance training, Gait training, Patient/Family education, Self Care, and Joint mobilization  PLAN FOR NEXT SESSION: Monitor BP throughout, initiate cardiovascular machines, gross LE strengthening to tolerance   Tramayne Sebesta L Sidney Silberman, PT, DPT, OCS 05/25/2023, 11:19 AM

## 2023-05-30 ENCOUNTER — Other Ambulatory Visit: Payer: Self-pay

## 2023-05-30 ENCOUNTER — Ambulatory Visit: Payer: Medicare HMO

## 2023-05-30 DIAGNOSIS — R2689 Other abnormalities of gait and mobility: Secondary | ICD-10-CM | POA: Diagnosis not present

## 2023-05-30 DIAGNOSIS — Z96651 Presence of right artificial knee joint: Secondary | ICD-10-CM

## 2023-05-30 DIAGNOSIS — M6281 Muscle weakness (generalized): Secondary | ICD-10-CM

## 2023-05-30 DIAGNOSIS — R2681 Unsteadiness on feet: Secondary | ICD-10-CM

## 2023-05-30 DIAGNOSIS — R293 Abnormal posture: Secondary | ICD-10-CM

## 2023-05-30 NOTE — Therapy (Signed)
OUTPATIENT PHYSICAL THERAPY TREATMENT   Patient Name: Ronald [name] MRN: [id number] DOB:1951/02/09, 72 y.o., male Today's Date: 05/30/2023  END OF SESSION:  PT End of Session - 05/30/23 1015     Visit Number 5    Number of Visits 12    Date for PT Re-Evaluation 07/11/23    Authorization Type [employer]    Progress Note Due on Visit 10    PT Start Time 1015    PT Stop Time 1100    PT Time Calculation (min) 45 min    Activity Tolerance Patient tolerated treatment well    Behavior During Therapy [facility] for tasks assessed/performed                 Past Medical History:  Diagnosis Date   Anemia    Arthritis    Bell's palsy    CAD in native artery 06/04/2020   RCA infarct 2013.  S/p RCA and OM PCI   Diabetes mellitus type 2 in nonobese (HCC) 06/04/2020   Diabetes mellitus without complication (HCC)    Essential hypertension 06/04/2020   Floaters in visual field    Hypertension    Myocardial infarction [facility])    Parkinson's disease    Parkinson's disease 06/04/2020   Pure hypercholesterolemia 06/04/2020   Past Surgical History:  Procedure Laterality Date   APPENDECTOMY     back epidural   08/2017   BACK SURGERY     CORONARY ANGIOPLASTY WITH STENT PLACEMENT  08/19/2012   KNEE SURGERY     TOTAL KNEE ARTHROPLASTY Left 04/21/2023   Procedure: LEFT TOTAL KNEE ARTHROPLASTY;  Surgeon: Kathryne Hitch, MD;  Location: [facility];  Service: Orthopedics;  Laterality: Left;   Patient Active Problem List   Diagnosis Date Noted   Status post total left knee replacement 04/21/2023   High risk medication use 10/28/2021   Olecranon bursitis, left elbow 04/21/2021   Personal history of COVID-19 04/13/2021   Primary osteoarthritis, 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   Essential hypertension 06/04/2020   Pure hypercholesterolemia 06/04/2020   CAD in native artery 06/04/2020   Parkinson's disease 06/04/2020   Diabetes mellitus type 2 in nonobese (HCC) 06/04/2020   Mild major  depression (HCC) 04/01/2020   Iron deficiency anemia 11/14/2019   Unilateral primary osteoarthritis, left knee 04/01/2019   Chronic left-sided low back pain with left-sided sciatica 04/01/2019   Lumbar radiculopathy 08/21/2017   Chronic pain syndrome 08/04/2017   Facet arthropathy 08/04/2017   Lumbar paraspinal muscle spasm 08/04/2017   Anemia of chronic disease 07/03/2017   Dizziness 11/10/2016   Hyperlipidemia, unspecified 10/27/2016   Other intervertebral disc degeneration, lumbar region 06/30/2016   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 heart with stable angina pectoris (HCC) 02/26/2016   SOB (shortness of breath) 02/26/2016   Old MI (myocardial infarction) 02/26/2016   OAB (overactive bladder) 09/05/2013   Ischemic heart disease 09/21/2012   Bell's palsy 06/14/2012    PCP: Woodroe Chen, MD  REFERRING PROVIDER: Doneen Poisson, MD  REFERRING DIAG: s/p L TKA due to osteoarthritis L knee  THERAPY DIAG:  Other abnormalities of gait and mobility  Status post total right knee replacement  Unsteadiness on feet  Muscle weakness (generalized)  Abnormal posture  Rationale for Evaluation and Treatment: Rehabilitation  ONSET DATE: 04/21/23  SUBJECTIVE:   SUBJECTIVE STATEMENT: Pt had iron infusion yesterday , was anemic hemagloin below 10/  feels less winded, fatigued today  PERTINENT HISTORY: Degenerative changes L knee, underwent elective TKA PAIN:  Are you  having pain? Yes: NPRS scale: 3/10 Pain location: L knee Pain description: hurts with activity Aggravating factors: gait, therex Relieving factors: pain pills, ice  PRECAUTIONS: Fall  RED FLAGS: None   WEIGHT BEARING RESTRICTIONS: No  FALLS:  Has patient fallen in last 6 months? Yes. Number of falls multiple  LIVING ENVIRONMENT: Lives with: lives with their spouse Lives in: House/apartment Stairs: No Has following equipment at home: Single point cane and Environmental consultant - 2  wheeled  OCCUPATION: retired  PLOF: Independent with basic ADLs, Independent with household mobility with device, Independent with transfers, Requires assistive device for independence, and Needs assistance with homemaking  PATIENT GOALS: return to gait with cane  NEXT MD VISIT: mid september  OBJECTIVE:   DIAGNOSTIC FINDINGS: na  PATIENT SURVEYS:  KOOS 12 72.9%  COGNITION: Overall cognitive status: Within functional limits for tasks assessed     SENSATION: Light touch: Impaired L plantar foot  EDEMA:  Mild L knee ant  MUSCLE LENGTH: Hamstrings: Right -25 deg; Left -30 deg  POSTURE: in standing weight bearing on B forefeet, forward lean, B hips and knees flexed slightly, thoracic kyphosis noted  PALPATION: Edematous L ant knee primarily, well healing scar L ant knee, raised, normal color, some warmth L knee as expected, patellar mobility normal  LOWER EXTREMITY ROM:  AA ROM Right eval Left eval  Hip flexion    Hip extension    Hip abduction    Hip adduction    Hip internal rotation    Hip external rotation    Knee flexion  122  Knee extension 0 -6  Ankle dorsiflexion    Ankle plantarflexion    Ankle inversion    Ankle eversion     (Blank rows = wfl)  LOWER EXTREMITY MMT: L SLR no lag, seated long arc quad L -22  MMT Right eval Left eval  Hip flexion 4+ 4-  Hip extension    Hip abduction    Hip adduction    Hip internal rotation    Hip external rotation    Knee flexion 4+ 4  Knee extension 4+ 4  Ankle dorsiflexion 3- 3-  Ankle plantarflexion 3- 2+  Ankle inversion    Ankle eversion     (Blank rows = not tested)  LOWER EXTREMITY SPECIAL TESTS:  NA  FUNCTIONAL TESTS:  30 seconds chair stand test 10 reps 10 meter walk test: 0.6 m/sec  GAIT: Distance walked: 90' Assistive device utilized: Environmental consultant - 2 wheeled Level of assistance: SBA Comments: B foot drop/slap, trendelenberg L, more pronounced when fatigued, flexed posture, narrow support,  with near scissoring.   TODAY'S TREATMENT:                                                                                                                              DATE: 05/30/23: Therapeutic exercise: Recumbent cycle, 6 min, level 3 Checked seated BP after completing recumbent cycle: 110/64  Standing at counter for penguins and  ant post rocks each leg, red t band around thighs, mass practice Lateral heel taps R from 2" block to isolate fine motor control, eccentrics L quads  Standing at counter for B heel raises, 20x BATCA B hamstring curls 20#, 15 x 2 BATCA B long arc quads, 20# 10 x 3 Reassessed BP  124/70   05/25/23: Therapeutic exercise: instructed in the following exercises designed to address b LE strength and stability and L knee flexibility: Nustep 6 min level 5 Supine, moist heat under lumbar region with physioball(green, 65cm) under thighs,for 15 reps each:  LTR Alt SLR B knee to chest(heels on ball) Bridging Supine hooklying for hip abd/ER 15x, blue band Long arc quads 5#, alternating legs 10 reps, 3 sets  Standing for hip abduction, 15 x each at counter Attempted hip ext L but became weak, requested sit down, monitored BP at  BP 82/48  once sitting Provided with glass of water, then assisted patient into supine position.  Monitored BP after 5 min supine: 122/60 Assisted to sitting and monitored after 3 min, 104/60      05/24/23 Bike L2x61min Seated L LAQ x 15 Supine Quad set 10x5" L Supine SLR x 10 L Supine LTR x 10 both ways Supine hip ADD ball squeeze 2x10 Supine heel slide with RTB x 12 L  05/19/23 Nustep L4x40min Standing hip abduction x 10 B Standing hip extension x 10 B Heel raise x 10 B very challenging with R LE Standing marches x 10 B Seated R LAQ x 10  Seated ball squeeze 10x3" Seated hamstring curls RTB x 10  Manual therapy: Scar mobilization to distal scar   05/16/23  Evaluation, reviewed his current home program with home health, which  included standing toe raises, standing hip abd, ext, and flexion, mini squats Advised to perform supine SLR 15 reps, as this ex was quite difficult for him  PATIENT EDUCATION:  Education details: HEP update- see below Person educated: Patient Education method: Explanation, Demonstration, and Tactile cues Education comprehension: verbalized understanding  HOME EXERCISE PROGRAM: Access Code: [email or web address] URL: [email or web address] Date: 05/19/2023 Prepared by: Verta Ellen  Exercises - Seated Hamstring Stretch with Chair  - 1 x daily - 7 x weekly - 3 sets - 30 seconds to 1 min hold - Seated Long Arc Quad  - 1 x daily - 7 x weekly - 3 sets - 10 reps - 3 sec hold - Seated Hip Adduction Squeeze with Ball  - 1 x daily - 7 x weekly - 3 sets - 10 reps - 3 sec hold  ASSESSMENT:  CLINICAL IMPRESSION: Pt recovering from L TKA.  Progressed with endurance and LE strength, stability ex.  Today.  He tolerated today's session much better than previous sessions, probably due to iron infusion yesterday.  Was able to perform several standing therex . L knee without pain today, advanced bulk strengthening as well.  Will benefit from skilled PT to address his deficits and maximize his outcome  L knee.   OBJECTIVE IMPAIRMENTS: Abnormal gait, decreased activity tolerance, decreased balance, decreased coordination, decreased endurance, decreased mobility, difficulty walking, decreased ROM, decreased strength, increased edema, impaired flexibility, impaired sensation, postural dysfunction, and pain.   ACTIVITY LIMITATIONS: carrying, lifting, bending, standing, squatting, stairs, transfers, and locomotion level  PARTICIPATION LIMITATIONS: meal prep, cleaning, laundry, shopping, and community activity  PERSONAL FACTORS: Age, Behavior pattern, Education, Past/current experiences, Time since onset of injury/illness/exacerbation, and 3+ comorbidities: parkinson's, spinal stenosis, DM, Htn, h/o falls  are  also affecting  patient's functional outcome.   REHAB POTENTIAL: Good  CLINICAL DECISION MAKING: Evolving/moderate complexity  EVALUATION COMPLEXITY: Moderate   GOALS: Goals reviewed with patient? Yes  SHORT TERM GOALS: Target date: 2 weeks 05/30/23 I HEP Baseline: Goal status: INITIAL   LONG TERM GOALS: Target date: 07/11/23: 8 weeks  Gait speed, increase from 0.6 m/sec to 1 m/sec, safely with LAD for improved community ambulation Baseline:  Goal status: INITIAL  2.  Gait distance improve from 46' to over 300' without fatigue, with LAD for improved household and community ambulation Baseline:  Goal status: INITIAL  3.  30 x sit to stand improve from 10 reps to 13 reps Baseline:  Goal status: INITIAL  4.  L  knee ext improve from -6 to -3, maintain L knee flexion 122 or greater Baseline:  Goal status: INITIAL  5.  Strength improve L quads to 5/5 for improved gait tolerance and transfers from multiple surfaces Baseline: 4/5 Goal status: INITIAL   PLAN:  PT FREQUENCY: 2x/week  PT DURATION: 8 weeks  PLANNED INTERVENTIONS: Therapeutic exercises, Therapeutic activity, Neuromuscular re-education, Balance training, Gait training, Patient/Family education, Self Care, and Joint mobilization  PLAN FOR NEXT SESSION: Monitor BP throughout, cardiovascular machines, gross LE strengthening to tolerance, stability and balance    Kristan Brummitt L Dontavis Tschantz, PT, DPT, OCS 05/30/2023, 12:07 PM

## 2023-05-31 ENCOUNTER — Encounter: Payer: Self-pay | Admitting: Orthopaedic Surgery

## 2023-05-31 ENCOUNTER — Ambulatory Visit (INDEPENDENT_AMBULATORY_CARE_PROVIDER_SITE_OTHER): Payer: Medicare HMO | Admitting: Orthopaedic Surgery

## 2023-05-31 DIAGNOSIS — Z96652 Presence of left artificial knee joint: Secondary | ICD-10-CM

## 2023-05-31 NOTE — Progress Notes (Signed)
The patient is 6 weeks status post a left total knee arthroplasty.  He is walking with a walking stick and states that he is doing well with good range of motion and strength.  On exam he does have excellent range of motion of that left operative knee.  There are some swelling and warmth to be expected but the knee is ligamentously stable and overall he looks way ahead of what most patients are at this point.  From my standpoint I do not need to see him for 3 months since he is doing so well.  At that visit we will have an AP and lateral of his left operative knee.

## 2023-06-02 ENCOUNTER — Ambulatory Visit: Payer: Medicare HMO

## 2023-06-02 DIAGNOSIS — Z96651 Presence of right artificial knee joint: Secondary | ICD-10-CM

## 2023-06-02 DIAGNOSIS — R293 Abnormal posture: Secondary | ICD-10-CM

## 2023-06-02 DIAGNOSIS — M6281 Muscle weakness (generalized): Secondary | ICD-10-CM

## 2023-06-02 DIAGNOSIS — R2689 Other abnormalities of gait and mobility: Secondary | ICD-10-CM

## 2023-06-02 DIAGNOSIS — R29818 Other symptoms and signs involving the nervous system: Secondary | ICD-10-CM

## 2023-06-02 DIAGNOSIS — R2681 Unsteadiness on feet: Secondary | ICD-10-CM

## 2023-06-02 NOTE — Therapy (Signed)
OUTPATIENT PHYSICAL THERAPY TREATMENT   Patient Name: Ronald [name] MRN: [id number] DOB:02/21/1951, 72 y.o., male Today's Date: 06/02/2023  END OF SESSION:  PT End of Session - 06/02/23 1058     Visit Number 6    Number of Visits 12    Date for PT Re-Evaluation 07/11/23    Authorization Type [employer]    Progress Note Due on Visit 10    PT Start Time 1018    PT Stop Time 1057    PT Time Calculation (min) 39 min    Activity Tolerance Patient tolerated treatment well    Behavior During Therapy WFL for tasks assessed/performed                  Past Medical History:  Diagnosis Date   Anemia    Arthritis    Bell's palsy    CAD in native artery 06/04/2020   RCA infarct 2013.  S/p RCA and OM PCI   Diabetes mellitus type 2 in nonobese (HCC) 06/04/2020   Diabetes mellitus without complication (HCC)    Essential hypertension 06/04/2020   Floaters in visual field    Hypertension    Myocardial infarction [facility])    Parkinson's disease    Parkinson's disease 06/04/2020   Pure hypercholesterolemia 06/04/2020   Past Surgical History:  Procedure Laterality Date   APPENDECTOMY     back epidural   08/2017   BACK SURGERY     CORONARY ANGIOPLASTY WITH STENT PLACEMENT  08/19/2012   KNEE SURGERY     TOTAL KNEE ARTHROPLASTY Left 04/21/2023   Procedure: LEFT TOTAL KNEE ARTHROPLASTY;  Surgeon: Kathryne Hitch, MD;  Location: [facility];  Service: Orthopedics;  Laterality: Left;   Patient Active Problem List   Diagnosis Date Noted   Status post total left knee replacement 04/21/2023   High risk medication use 10/28/2021   Olecranon bursitis, left elbow 04/21/2021   Personal history of COVID-19 04/13/2021   Primary osteoarthritis, 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   Essential hypertension 06/04/2020   Pure hypercholesterolemia 06/04/2020   CAD in native artery 06/04/2020   Parkinson's disease 06/04/2020   Diabetes mellitus type 2 in nonobese (HCC) 06/04/2020   Mild major  depression (HCC) 04/01/2020   Iron deficiency anemia 11/14/2019   Unilateral primary osteoarthritis, left knee 04/01/2019   Chronic left-sided low back pain with left-sided sciatica 04/01/2019   Lumbar radiculopathy 08/21/2017   Chronic pain syndrome 08/04/2017   Facet arthropathy 08/04/2017   Lumbar paraspinal muscle spasm 08/04/2017   Anemia of chronic disease 07/03/2017   Dizziness 11/10/2016   Hyperlipidemia, unspecified 10/27/2016   Other intervertebral disc degeneration, lumbar region 06/30/2016   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 heart with stable angina pectoris (HCC) 02/26/2016   SOB (shortness of breath) 02/26/2016   Old MI (myocardial infarction) 02/26/2016   OAB (overactive bladder) 09/05/2013   Ischemic heart disease 09/21/2012   Bell's palsy 06/14/2012    PCP: Woodroe Chen, MD  REFERRING PROVIDER: Doneen Poisson, MD  REFERRING DIAG: s/p L TKA due to osteoarthritis L knee  THERAPY DIAG:  Other abnormalities of gait and mobility  Status post total right knee replacement  Unsteadiness on feet  Muscle weakness (generalized)  Abnormal posture  Other symptoms and signs involving the nervous system  Rationale for Evaluation and Treatment: Rehabilitation  ONSET DATE: 04/21/23  SUBJECTIVE:   SUBJECTIVE STATEMENT: Pt feeling better today, c/o of L shoulder pain and LBP no knee pain.  PERTINENT HISTORY: Degenerative changes L knee,  underwent elective TKA PAIN:  Are you having pain? Yes: NPRS scale: 0/10 Pain location: L knee Pain description: hurts with activity Aggravating factors: gait, therex Relieving factors: pain pills, ice  PRECAUTIONS: Fall  RED FLAGS: None   WEIGHT BEARING RESTRICTIONS: No  FALLS:  Has patient fallen in last 6 months? Yes. Number of falls multiple  LIVING ENVIRONMENT: Lives with: lives with their spouse Lives in: House/apartment Stairs: No Has following equipment at home: Single point cane and  Environmental consultant - 2 wheeled  OCCUPATION: retired  PLOF: Independent with basic ADLs, Independent with household mobility with device, Independent with transfers, Requires assistive device for independence, and Needs assistance with homemaking  PATIENT GOALS: return to gait with cane  NEXT MD VISIT: mid september  OBJECTIVE:   DIAGNOSTIC FINDINGS: na  PATIENT SURVEYS:  KOOS 12 72.9%  COGNITION: Overall cognitive status: Within functional limits for tasks assessed     SENSATION: Light touch: Impaired L plantar foot  EDEMA:  Mild L knee ant  MUSCLE LENGTH: Hamstrings: Right -25 deg; Left -30 deg  POSTURE: in standing weight bearing on B forefeet, forward lean, B hips and knees flexed slightly, thoracic kyphosis noted  PALPATION: Edematous L ant knee primarily, well healing scar L ant knee, raised, normal color, some warmth L knee as expected, patellar mobility normal  LOWER EXTREMITY ROM:  AA ROM Right eval Left eval  Hip flexion    Hip extension    Hip abduction    Hip adduction    Hip internal rotation    Hip external rotation    Knee flexion  122  Knee extension 0 -6  Ankle dorsiflexion    Ankle plantarflexion    Ankle inversion    Ankle eversion     (Blank rows = wfl)  LOWER EXTREMITY MMT: L SLR no lag, seated long arc quad L -22  MMT Right eval Left eval  Hip flexion 4+ 4-  Hip extension    Hip abduction    Hip adduction    Hip internal rotation    Hip external rotation    Knee flexion 4+ 4  Knee extension 4+ 4  Ankle dorsiflexion 3- 3-  Ankle plantarflexion 3- 2+  Ankle inversion    Ankle eversion     (Blank rows = not tested)  LOWER EXTREMITY SPECIAL TESTS:  NA  FUNCTIONAL TESTS:  30 seconds chair stand test 10 reps 10 meter walk test: 0.6 m/sec  GAIT: Distance walked: 90' Assistive device utilized: Environmental consultant - 2 wheeled Level of assistance: SBA Comments: B foot drop/slap, trendelenberg L, more pronounced when fatigued, flexed posture, narrow  support, with near scissoring.   TODAY'S TREATMENT:                                                                                                                              DATE: 06/02/23 Bike L2x58min Sidesteps 3x down and back along counter  Retro step/ ant/post WS with B  UE support x 12 each Sit to stands x 10 with UE support Standing balance with EC and feet together 2 trials with CGA Knee flexion 25# 2x10 BLE Knee extension 20# BLE 2x10; 10# LLE 2x10  05/30/23: Therapeutic exercise: Recumbent cycle, 6 min, level 3 Checked seated BP after completing recumbent cycle: 110/64  Standing at counter for penguins and ant post rocks each leg, red t band around thighs, mass practice Lateral heel taps R from 2" block to isolate fine motor control, eccentrics L quads  Standing at counter for B heel raises, 20x BATCA B hamstring curls 20#, 15 x 2 BATCA B long arc quads, 20# 10 x 3 Reassessed BP  124/70   05/25/23: Therapeutic exercise: instructed in the following exercises designed to address b LE strength and stability and L knee flexibility: Nustep 6 min level 5 Supine, moist heat under lumbar region with physioball(green, 65cm) under thighs,for 15 reps each:  LTR Alt SLR B knee to chest(heels on ball) Bridging Supine hooklying for hip abd/ER 15x, blue band Long arc quads 5#, alternating legs 10 reps, 3 sets  Standing for hip abduction, 15 x each at counter Attempted hip ext L but became weak, requested sit down, monitored BP at  BP 82/48  once sitting Provided with glass of water, then assisted patient into supine position.  Monitored BP after 5 min supine: 122/60 Assisted to sitting and monitored after 3 min, 104/60      05/24/23 Bike L2x47min Seated L LAQ x 15 Supine Quad set 10x5" L Supine SLR x 10 L Supine LTR x 10 both ways Supine hip ADD ball squeeze 2x10 Supine heel slide with RTB x 12 L  05/19/23 Nustep L4x4min Standing hip abduction x 10 B Standing hip  extension x 10 B Heel raise x 10 B very challenging with R LE Standing marches x 10 B Seated R LAQ x 10  Seated ball squeeze 10x3" Seated hamstring curls RTB x 10  Manual therapy: Scar mobilization to distal scar   05/16/23  Evaluation, reviewed his current home program with home health, which included standing toe raises, standing hip abd, ext, and flexion, mini squats Advised to perform supine SLR 15 reps, as this ex was quite difficult for him  PATIENT EDUCATION:  Education details: HEP update- see below Person educated: Patient Education method: Explanation, Demonstration, and Tactile cues Education comprehension: verbalized understanding  HOME EXERCISE PROGRAM: Access Code: [email or web address] URL: [email or web address] Date: 05/19/2023 Prepared by: Verta Ellen  Exercises - Seated Hamstring Stretch with Chair  - 1 x daily - 7 x weekly - 3 sets - 30 seconds to 1 min hold - Seated Long Arc Quad  - 1 x daily - 7 x weekly - 3 sets - 10 reps - 3 sec hold - Seated Hip Adduction Squeeze with Ball  - 1 x daily - 7 x weekly - 3 sets - 10 reps - 3 sec hold  ASSESSMENT:  CLINICAL IMPRESSION: Progressed with endurance and LE strength, stability ex. No episodes of dizziness or lightheadedness today. Good tolerance for exercises but multiple breaks required throughout session. Cues given with weight shifts to avoid postural leaning anteriorly and along with CGA given with standing balance. Will benefit from skilled PT to address his deficits and maximize his outcome  L knee.   OBJECTIVE IMPAIRMENTS: Abnormal gait, decreased activity tolerance, decreased balance, decreased coordination, decreased endurance, decreased mobility, difficulty walking, decreased ROM, decreased strength, increased edema, impaired flexibility, impaired sensation, postural dysfunction, and pain.  ACTIVITY LIMITATIONS: carrying, lifting, bending, standing, squatting, stairs, transfers, and locomotion  level  PARTICIPATION LIMITATIONS: meal prep, cleaning, laundry, shopping, and community activity  PERSONAL FACTORS: Age, Behavior pattern, Education, Past/current experiences, Time since onset of injury/illness/exacerbation, and 3+ comorbidities: parkinson's, spinal stenosis, DM, Htn, h/o falls  are also affecting patient's functional outcome.   REHAB POTENTIAL: Good  CLINICAL DECISION MAKING: Evolving/moderate complexity  EVALUATION COMPLEXITY: Moderate   GOALS: Goals reviewed with patient? Yes  SHORT TERM GOALS: Target date: 2 weeks 05/30/23 I HEP Baseline: Goal status: MET- 06/02/23   LONG TERM GOALS: Target date: 07/11/23: 8 weeks  Gait speed, increase from 0.6 m/sec to 1 m/sec, safely with LAD for improved community ambulation Baseline:  Goal status: INITIAL  2.  Gait distance improve from 61' to over 300' without fatigue, with LAD for improved household and community ambulation Baseline:  Goal status: INITIAL  3.  30 x sit to stand improve from 10 reps to 13 reps Baseline:  Goal status: INITIAL  4.  L  knee ext improve from -6 to -3, maintain L knee flexion 122 or greater Baseline:  Goal status: INITIAL  5.  Strength improve L quads to 5/5 for improved gait tolerance and transfers from multiple surfaces Baseline: 4/5 Goal status: INITIAL   PLAN:  PT FREQUENCY: 2x/week  PT DURATION: 8 weeks  PLANNED INTERVENTIONS: Therapeutic exercises, Therapeutic activity, Neuromuscular re-education, Balance training, Gait training, Patient/Family education, Self Care, and Joint mobilization  PLAN FOR NEXT SESSION: Monitor BP throughout, cardiovascular machines, gross LE strengthening to tolerance, stability and balance    Lovene Maret L Tarell Schollmeyer, PTA 06/02/2023, 11:03 AM

## 2023-06-06 ENCOUNTER — Ambulatory Visit: Payer: Medicare HMO

## 2023-06-06 ENCOUNTER — Other Ambulatory Visit: Payer: Self-pay

## 2023-06-06 DIAGNOSIS — R2681 Unsteadiness on feet: Secondary | ICD-10-CM

## 2023-06-06 DIAGNOSIS — M6281 Muscle weakness (generalized): Secondary | ICD-10-CM

## 2023-06-06 DIAGNOSIS — Z96651 Presence of right artificial knee joint: Secondary | ICD-10-CM

## 2023-06-06 DIAGNOSIS — R293 Abnormal posture: Secondary | ICD-10-CM

## 2023-06-06 DIAGNOSIS — R2689 Other abnormalities of gait and mobility: Secondary | ICD-10-CM

## 2023-06-06 NOTE — Therapy (Signed)
OUTPATIENT PHYSICAL THERAPY TREATMENT   Patient Name: Ronald [name] MRN: [id number] DOB:29-Apr-1951, 72 y.o., male Today's Date: 06/06/2023  END OF SESSION:  PT End of Session - 06/06/23 1023     Visit Number 7    Number of Visits 12    Date for PT Re-Evaluation 07/11/23    Authorization Time Period 05/16/2023-04/14/2023    Authorization - Number of Visits 16    Progress Note Due on Visit 10    PT Start Time 1018    PT Stop Time 1100    PT Time Calculation (min) 42 min    Activity Tolerance Patient tolerated treatment well    Behavior During Therapy [facility] for tasks assessed/performed             Past Medical History:  Diagnosis Date   Anemia    Arthritis    Bell's palsy    CAD in native artery 06/04/2020   RCA infarct 2013.  S/p RCA and OM PCI   Diabetes mellitus type 2 in nonobese (HCC) 06/04/2020   Diabetes mellitus without complication (HCC)    Essential hypertension 06/04/2020   Floaters in visual field    Hypertension    Myocardial infarction [facility])    Parkinson's disease    Parkinson's disease 06/04/2020   Pure hypercholesterolemia 06/04/2020   Past Surgical History:  Procedure Laterality Date   APPENDECTOMY     back epidural   08/2017   BACK SURGERY     CORONARY ANGIOPLASTY WITH STENT PLACEMENT  08/19/2012   KNEE SURGERY     TOTAL KNEE ARTHROPLASTY Left 04/21/2023   Procedure: LEFT TOTAL KNEE ARTHROPLASTY;  Surgeon: Kathryne Hitch, MD;  Location: [facility];  Service: Orthopedics;  Laterality: Left;   Patient Active Problem List   Diagnosis Date Noted   Status post total left knee replacement 04/21/2023   High risk medication use 10/28/2021   Olecranon bursitis, left elbow 04/21/2021   Personal history of COVID-19 04/13/2021   Primary osteoarthritis, 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   Essential hypertension 06/04/2020   Pure hypercholesterolemia 06/04/2020   CAD in native artery 06/04/2020   Parkinson's disease 06/04/2020   Diabetes mellitus type 2 in  nonobese (HCC) 06/04/2020   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   Iron deficiency anemia 11/14/2019   Unilateral primary osteoarthritis, left knee 04/01/2019   Chronic left-sided low back pain with left-sided sciatica 04/01/2019   Lumbar radiculopathy 08/21/2017   Chronic pain syndrome 08/04/2017   Facet arthropathy 08/04/2017   Lumbar paraspinal muscle spasm 08/04/2017   Anemia of chronic disease 07/03/2017   Dizziness 11/10/2016   Hyperlipidemia, unspecified 10/27/2016   Other intervertebral disc degeneration, lumbar region 06/30/2016   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 heart with stable angina pectoris (HCC) 02/26/2016   SOB (shortness of breath) 02/26/2016   Old MI (myocardial infarction) 02/26/2016   OAB (overactive bladder) 09/05/2013   Ischemic heart disease 09/21/2012   Bell's palsy 06/14/2012    PCP: Woodroe Chen, MD  REFERRING PROVIDER: Doneen Poisson, MD  REFERRING DIAG: s/p L TKA due to osteoarthritis L knee  THERAPY DIAG:  Other abnormalities of gait and mobility  Status post total right knee replacement  Unsteadiness on feet  Muscle weakness (generalized)  Abnormal posture  Rationale for Evaluation and Treatment: Rehabilitation  ONSET DATE: 04/21/23  SUBJECTIVE:   SUBJECTIVE STATEMENT: Pt feeling off today, states blood pressure was high this am 150/90.  Prior to his BP meds, wants to have it checked while here today.  Still fatigues easily,  LB L LE and R knee ok   PERTINENT HISTORY: Degenerative changes L knee, underwent elective TKA PAIN:  Are you having pain? Yes: NPRS scale: 0/10 Pain location: L knee Pain description: hurts with activity Aggravating factors: gait, therex Relieving factors: pain pills, ice  PRECAUTIONS: Fall  RED FLAGS: None   WEIGHT BEARING RESTRICTIONS: No  FALLS:  Has patient fallen in last 6 months? Yes. Number of falls multiple  LIVING ENVIRONMENT: Lives with: lives with their  spouse Lives in: House/apartment Stairs: No Has following equipment at home: Single point cane and Environmental consultant - 2 wheeled  OCCUPATION: retired  PLOF: Independent with basic ADLs, Independent with household mobility with device, Independent with transfers, Requires assistive device for independence, and Needs assistance with homemaking  PATIENT GOALS: return to gait with cane  NEXT MD VISIT: mid september  OBJECTIVE:   DIAGNOSTIC FINDINGS: na  PATIENT SURVEYS:  KOOS 12 72.9%  COGNITION: Overall cognitive status: Within functional limits for tasks assessed     SENSATION: Light touch: Impaired L plantar foot  EDEMA:  Mild L knee ant  MUSCLE LENGTH: Hamstrings: Right -25 deg; Left -30 deg  POSTURE: in standing weight bearing on B forefeet, forward lean, B hips and knees flexed slightly, thoracic kyphosis noted  PALPATION: Edematous L ant knee primarily, well healing scar L ant knee, raised, normal color, some warmth L knee as expected, patellar mobility normal  LOWER EXTREMITY ROM:  AA ROM Right eval Left eval  Hip flexion    Hip extension    Hip abduction    Hip adduction    Hip internal rotation    Hip external rotation    Knee flexion  122  Knee extension 0 -6  Ankle dorsiflexion    Ankle plantarflexion    Ankle inversion    Ankle eversion     (Blank rows = wfl)  LOWER EXTREMITY MMT: L SLR no lag, seated long arc quad L -22  MMT Right eval Left eval  Hip flexion 4+ 4-  Hip extension    Hip abduction    Hip adduction    Hip internal rotation    Hip external rotation    Knee flexion 4+ 4  Knee extension 4+ 4  Ankle dorsiflexion 3- 3-  Ankle plantarflexion 3- 2+  Ankle inversion    Ankle eversion     (Blank rows = not tested)  LOWER EXTREMITY SPECIAL TESTS:  NA  FUNCTIONAL TESTS:  30 seconds chair stand test 10 reps 10 meter walk test: 0.6 m/sec  GAIT: Distance walked: 90' Assistive device utilized: Environmental consultant - 2 wheeled Level of assistance:  SBA Comments: B foot drop/slap, trendelenberg L, more pronounced when fatigued, flexed posture, narrow support, with near scissoring.   TODAY'S TREATMENT:  DATE: 06/06/23: Bike level 5, 6 min BP 116/72 taken after 2 min seated rest following bike Seated B knee flexion on BATCA 25#, 2 x 10 Seated B knee ext BATCA 20# B LE's 2 x 10 Seated unilateral knee ext 10#, 10 x 1 set each leg Standing, penguins and for/back rocks with knees extended with red t band around thighs to fatigue Patient c/o feeling dizzy, fatigued so rechecked BP and it was 110/58  Moved to mat table for supine bridging with feet over 65 cm physio ball and for supine alt SLR with feet on physioball, 10 reps each   06/02/23 Bike L2x38min Sidesteps 3x down and back along counter  Retro step/ ant/post WS with B UE support x 12 each Sit to stands x 10 with UE support Standing balance with EC and feet together 2 trials with CGA Knee flexion 25# 2x10 BLE Knee extension 20# BLE 2x10; 10# LLE 2x10  05/30/23: Therapeutic exercise: Recumbent cycle, 6 min, level 3 Checked seated BP after completing recumbent cycle: 110/64  Standing at counter for penguins and ant post rocks each leg, red t band around thighs, mass practice Lateral heel taps R from 2" block to isolate fine motor control, eccentrics L quads  Standing at counter for B heel raises, 20x BATCA B hamstring curls 20#, 15 x 2 BATCA B long arc quads, 20# 10 x 3 Reassessed BP  124/70   05/25/23: Therapeutic exercise: instructed in the following exercises designed to address b LE strength and stability and L knee flexibility: Nustep 6 min level 5 Supine, moist heat under lumbar region with physioball(green, 65cm) under thighs,for 15 reps each:  LTR Alt SLR B knee to chest(heels on ball) Bridging Supine hooklying for hip abd/ER 15x, blue  band Long arc quads 5#, alternating legs 10 reps, 3 sets  Standing for hip abduction, 15 x each at counter Attempted hip ext L but became weak, requested sit down, monitored BP at  BP 82/48  once sitting Provided with glass of water, then assisted patient into supine position.  Monitored BP after 5 min supine: 122/60 Assisted to sitting and monitored after 3 min, 104/60      05/24/23 Bike L2x30min Seated L LAQ x 15 Supine Quad set 10x5" L Supine SLR x 10 L Supine LTR x 10 both ways Supine hip ADD ball squeeze 2x10 Supine heel slide with RTB x 12 L  05/19/23 Nustep L4x67min Standing hip abduction x 10 B Standing hip extension x 10 B Heel raise x 10 B very challenging with R LE Standing marches x 10 B Seated R LAQ x 10  Seated ball squeeze 10x3" Seated hamstring curls RTB x 10  Manual therapy: Scar mobilization to distal scar   05/16/23  Evaluation, reviewed his current home program with home health, which included standing toe raises, standing hip abd, ext, and flexion, mini squats Advised to perform supine SLR 15 reps, as this ex was quite difficult for him  PATIENT EDUCATION:  Education details: HEP update- see below Person educated: Patient Education method: Explanation, Demonstration, and Tactile cues Education comprehension: verbalized understanding  HOME EXERCISE PROGRAM: Access Code: [email or web address] URL: [email or web address] Date: 05/19/2023 Prepared by: Verta Ellen  Exercises - Seated Hamstring Stretch with Chair  - 1 x daily - 7 x weekly - 3 sets - 30 seconds to 1 min hold - Seated Long Arc Quad  - 1 x daily - 7 x weekly - 3 sets - 10 reps - 3 sec hold -  Seated Hip Adduction Squeeze with Ball  - 1 x daily - 7 x weekly - 3 sets - 10 reps - 3 sec hold  ASSESSMENT:  CLINICAL IMPRESSION: Progressed with endurance and LE strength, stability ex. More dizzy today.  Provided with water this visit due to some postural changes in blood pressure.  Benefits  from skilled PT to continue to develop function, strength, stamina following L TKA.  Advised him to have hemoglobin checked again as he was low 2 weeks ago.Will benefit from skilled PT to address his deficits and maximize his outcome  L knee.   OBJECTIVE IMPAIRMENTS: Abnormal gait, decreased activity tolerance, decreased balance, decreased coordination, decreased endurance, decreased mobility, difficulty walking, decreased ROM, decreased strength, increased edema, impaired flexibility, impaired sensation, postural dysfunction, and pain.   ACTIVITY LIMITATIONS: carrying, lifting, bending, standing, squatting, stairs, transfers, and locomotion level  PARTICIPATION LIMITATIONS: meal prep, cleaning, laundry, shopping, and community activity  PERSONAL FACTORS: Age, Behavior pattern, Education, Past/current experiences, Time since onset of injury/illness/exacerbation, and 3+ comorbidities: parkinson's, spinal stenosis, DM, Htn, h/o falls  are also affecting patient's functional outcome.   REHAB POTENTIAL: Good  CLINICAL DECISION MAKING: Evolving/moderate complexity  EVALUATION COMPLEXITY: Moderate   GOALS: Goals reviewed with patient? Yes  SHORT TERM GOALS: Target date: 2 weeks 05/30/23 I HEP Baseline: Goal status: MET- 06/02/23   LONG TERM GOALS: Target date: 07/11/23: 8 weeks  Gait speed, increase from 0.6 m/sec to 1 m/sec, safely with LAD for improved community ambulation Baseline:  Goal status: INITIAL  2.  Gait distance improve from 20' to over 300' without fatigue, with LAD for improved household and community ambulation Baseline:  Goal status: INITIAL  3.  30 x sit to stand improve from 10 reps to 13 reps Baseline:  Goal status: INITIAL  4.  L  knee ext improve from -6 to -3, maintain L knee flexion 122 or greater Baseline:  Goal status: INITIAL  5.  Strength improve L quads to 5/5 for improved gait tolerance and transfers from multiple surfaces Baseline: 4/5 Goal status:  INITIAL   PLAN:  PT FREQUENCY: 2x/week  PT DURATION: 8 weeks  PLANNED INTERVENTIONS: Therapeutic exercises, Therapeutic activity, Neuromuscular re-education, Balance training, Gait training, Patient/Family education, Self Care, and Joint mobilization  PLAN FOR NEXT SESSION: Monitor BP throughout, cardiovascular machines, gross LE strengthening to tolerance, stability and balance    Jaylanie Boschee L Maria Coin, PT, DPT, OCS 06/06/2023, 1:25 PM

## 2023-06-08 ENCOUNTER — Ambulatory Visit: Payer: Medicare HMO

## 2023-06-08 DIAGNOSIS — R2681 Unsteadiness on feet: Secondary | ICD-10-CM

## 2023-06-08 DIAGNOSIS — M6281 Muscle weakness (generalized): Secondary | ICD-10-CM

## 2023-06-08 DIAGNOSIS — R2689 Other abnormalities of gait and mobility: Secondary | ICD-10-CM | POA: Diagnosis not present

## 2023-06-08 DIAGNOSIS — Z96651 Presence of right artificial knee joint: Secondary | ICD-10-CM

## 2023-06-08 DIAGNOSIS — R293 Abnormal posture: Secondary | ICD-10-CM

## 2023-06-08 NOTE — Therapy (Signed)
OUTPATIENT PHYSICAL THERAPY TREATMENT   Patient Name: Ronald [name] MRN: [id number] DOB:Jul 06, 1951, 72 y.o., male Today's Date: 06/08/2023  END OF SESSION:  PT End of Session - 06/08/23 0901     Visit Number 8    Number of Visits 12    Date for PT Re-Evaluation 07/11/23    Authorization Time Period 05/16/2023-04/14/2023    Authorization - Visit Number 8    Authorization - Number of Visits 16    Progress Note Due on Visit 10    PT Start Time 0855   pt late   PT Stop Time 0930    PT Time Calculation (min) 35 min    Activity Tolerance Patient tolerated treatment well    Behavior During Therapy [facility] for tasks assessed/performed              Past Medical History:  Diagnosis Date   Anemia    Arthritis    Bell's palsy    CAD in native artery 06/04/2020   RCA infarct 2013.  S/p RCA and OM PCI   Diabetes mellitus type 2 in nonobese (HCC) 06/04/2020   Diabetes mellitus without complication (HCC)    Essential hypertension 06/04/2020   Floaters in visual field    Hypertension    Myocardial infarction [facility])    Parkinson's disease    Parkinson's disease 06/04/2020   Pure hypercholesterolemia 06/04/2020   Past Surgical History:  Procedure Laterality Date   APPENDECTOMY     back epidural   08/2017   BACK SURGERY     CORONARY ANGIOPLASTY WITH STENT PLACEMENT  08/19/2012   KNEE SURGERY     TOTAL KNEE ARTHROPLASTY Left 04/21/2023   Procedure: LEFT TOTAL KNEE ARTHROPLASTY;  Surgeon: Kathryne Hitch, MD;  Location: [facility];  Service: Orthopedics;  Laterality: Left;   Patient Active Problem List   Diagnosis Date Noted   Status post total left knee replacement 04/21/2023   High risk medication use 10/28/2021   Olecranon bursitis, left elbow 04/21/2021   Personal history of COVID-19 04/13/2021   Primary osteoarthritis, 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   Essential hypertension 06/04/2020   Pure hypercholesterolemia 06/04/2020   CAD in native artery 06/04/2020   Parkinson's  disease 06/04/2020   Diabetes mellitus type 2 in nonobese (HCC) 06/04/2020   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   Iron deficiency anemia 11/14/2019   Unilateral primary osteoarthritis, left knee 04/01/2019   Chronic left-sided low back pain with left-sided sciatica 04/01/2019   Lumbar radiculopathy 08/21/2017   Chronic pain syndrome 08/04/2017   Facet arthropathy 08/04/2017   Lumbar paraspinal muscle spasm 08/04/2017   Anemia of chronic disease 07/03/2017   Dizziness 11/10/2016   Hyperlipidemia, unspecified 10/27/2016   Other intervertebral disc degeneration, lumbar region 06/30/2016   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 heart with stable angina pectoris (HCC) 02/26/2016   SOB (shortness of breath) 02/26/2016   Old MI (myocardial infarction) 02/26/2016   OAB (overactive bladder) 09/05/2013   Ischemic heart disease 09/21/2012   Bell's palsy 06/14/2012    PCP: Woodroe Chen, MD  REFERRING PROVIDER: Doneen Poisson, MD  REFERRING DIAG: s/p L TKA due to osteoarthritis L knee  THERAPY DIAG:  Other abnormalities of gait and mobility  Status post total right knee replacement  Unsteadiness on feet  Muscle weakness (generalized)  Abnormal posture  Rationale for Evaluation and Treatment: Rehabilitation  ONSET DATE: 04/21/23  SUBJECTIVE:   SUBJECTIVE STATEMENT: Pt reports his knee is doing just fine, no lightheadedness or dizziness. Did not  eat anything and only drank a cup of water before coming here.  PERTINENT HISTORY: Degenerative changes L knee, underwent elective TKA PAIN:  Are you having pain? Yes: NPRS scale: 0/10 Pain location: L knee Pain description: hurts with activity Aggravating factors: gait, therex Relieving factors: pain pills, ice  PRECAUTIONS: Fall  RED FLAGS: None   WEIGHT BEARING RESTRICTIONS: No  FALLS:  Has patient fallen in last 6 months? Yes. Number of falls multiple  LIVING ENVIRONMENT: Lives with: lives with  their spouse Lives in: House/apartment Stairs: No Has following equipment at home: Single point cane and Environmental consultant - 2 wheeled  OCCUPATION: retired  PLOF: Independent with basic ADLs, Independent with household mobility with device, Independent with transfers, Requires assistive device for independence, and Needs assistance with homemaking  PATIENT GOALS: return to gait with cane  NEXT MD VISIT: mid september  OBJECTIVE:   DIAGNOSTIC FINDINGS: na  PATIENT SURVEYS:  KOOS 12 72.9%  COGNITION: Overall cognitive status: Within functional limits for tasks assessed     SENSATION: Light touch: Impaired L plantar foot  EDEMA:  Mild L knee ant  MUSCLE LENGTH: Hamstrings: Right -25 deg; Left -30 deg  POSTURE: in standing weight bearing on B forefeet, forward lean, B hips and knees flexed slightly, thoracic kyphosis noted  PALPATION: Edematous L ant knee primarily, well healing scar L ant knee, raised, normal color, some warmth L knee as expected, patellar mobility normal  LOWER EXTREMITY ROM:  AA ROM Right eval Left eval Left 06/08/23  Hip flexion     Hip extension     Hip abduction     Hip adduction     Hip internal rotation     Hip external rotation     Knee flexion  122 118  Knee extension 0 -6 3  Ankle dorsiflexion     Ankle plantarflexion     Ankle inversion     Ankle eversion      (Blank rows = wfl)  LOWER EXTREMITY MMT: L SLR no lag, seated long arc quad L -22  MMT Right eval Left eval  Hip flexion 4+ 4-  Hip extension    Hip abduction    Hip adduction    Hip internal rotation    Hip external rotation    Knee flexion 4+ 4  Knee extension 4+ 4  Ankle dorsiflexion 3- 3-  Ankle plantarflexion 3- 2+  Ankle inversion    Ankle eversion     (Blank rows = not tested)  LOWER EXTREMITY SPECIAL TESTS:  NA  FUNCTIONAL TESTS:  30 seconds chair stand test 10 reps 10 meter walk test: 0.6 m/sec  GAIT: Distance walked: 90' Assistive device utilized: Environmental consultant  - 2 wheeled Level of assistance: SBA Comments: B foot drop/slap, trendelenberg L, more pronounced when fatigued, flexed posture, narrow support, with near scissoring.   TODAY'S TREATMENT:  DATE: 06/08/23 Bike L3x16min BP checked before exercises: 108/76- continued with exercise as patient non symptomatic and reports always having low BP Knee extension 25# 2x10  Knee flexion 25# 2x10 Squats 2 x 10  Toe taps onto 6' step 2x10 L 06/06/23: Bike level 5, 6 min BP 116/72 taken after 2 min seated rest following bike Seated B knee flexion on BATCA 25#, 2 x 10 Seated B knee ext BATCA 20# B LE's 2 x 10 Seated unilateral knee ext 10#, 10 x 1 set each leg Standing, penguins and for/back rocks with knees extended with red t band around thighs to fatigue Patient c/o feeling dizzy, fatigued so rechecked BP and it was 110/58  Moved to mat table for supine bridging with feet over 65 cm physio ball and for supine alt SLR with feet on physioball, 10 reps each   06/02/23 Bike L2x31min Sidesteps 3x down and back along counter  Retro step/ ant/post WS with B UE support x 12 each Sit to stands x 10 with UE support Standing balance with EC and feet together 2 trials with CGA Knee flexion 25# 2x10 BLE Knee extension 20# BLE 2x10; 10# LLE 2x10  05/30/23: Therapeutic exercise: Recumbent cycle, 6 min, level 3 Checked seated BP after completing recumbent cycle: 110/64  Standing at counter for penguins and ant post rocks each leg, red t band around thighs, mass practice Lateral heel taps R from 2" block to isolate fine motor control, eccentrics L quads  Standing at counter for B heel raises, 20x BATCA B hamstring curls 20#, 15 x 2 BATCA B long arc quads, 20# 10 x 3 Reassessed BP  124/70   05/25/23: Therapeutic exercise: instructed in the following exercises designed to address b LE  strength and stability and L knee flexibility: Nustep 6 min level 5 Supine, moist heat under lumbar region with physioball(green, 65cm) under thighs,for 15 reps each:  LTR Alt SLR B knee to chest(heels on ball) Bridging Supine hooklying for hip abd/ER 15x, blue band Long arc quads 5#, alternating legs 10 reps, 3 sets  Standing for hip abduction, 15 x each at counter Attempted hip ext L but became weak, requested sit down, monitored BP at  BP 82/48  once sitting Provided with glass of water, then assisted patient into supine position.  Monitored BP after 5 min supine: 122/60 Assisted to sitting and monitored after 3 min, 104/60      05/24/23 Bike L2x58min Seated L LAQ x 15 Supine Quad set 10x5" L Supine SLR x 10 L Supine LTR x 10 both ways Supine hip ADD ball squeeze 2x10 Supine heel slide with RTB x 12 L  05/19/23 Nustep L4x26min Standing hip abduction x 10 B Standing hip extension x 10 B Heel raise x 10 B very challenging with R LE Standing marches x 10 B Seated R LAQ x 10  Seated ball squeeze 10x3" Seated hamstring curls RTB x 10  Manual therapy: Scar mobilization to distal scar   05/16/23  Evaluation, reviewed his current home program with home health, which included standing toe raises, standing hip abd, ext, and flexion, mini squats Advised to perform supine SLR 15 reps, as this ex was quite difficult for him  PATIENT EDUCATION:  Education details: HEP update- see below Person educated: Patient Education method: Explanation, Demonstration, and Tactile cues Education comprehension: verbalized understanding  HOME EXERCISE PROGRAM: Access Code: [email or web address] URL: [email or web address] Date: 05/19/2023 Prepared by: Verta Ellen  Exercises - Seated Hamstring Stretch with Chair  -  1 x daily - 7 x weekly - 3 sets - 30 seconds to 1 min hold - Seated Long Arc Quad  - 1 x daily - 7 x weekly - 3 sets - 10 reps - 3 sec hold - Seated Hip Adduction Squeeze with  Ball  - 1 x daily - 7 x weekly - 3 sets - 10 reps - 3 sec hold  ASSESSMENT:  CLINICAL IMPRESSION: Pt arrived late to appointment today. He was able to participate in increased resistance training and progressed strengthening for B knees/hips. Cuing and supervision for safety with exercises for proper form and technique. L knee extension has improved from 6 deg to 3 deg today. Will benefit from skilled PT to address his deficits and maximize his outcome  L knee.   OBJECTIVE IMPAIRMENTS: Abnormal gait, decreased activity tolerance, decreased balance, decreased coordination, decreased endurance, decreased mobility, difficulty walking, decreased ROM, decreased strength, increased edema, impaired flexibility, impaired sensation, postural dysfunction, and pain.   ACTIVITY LIMITATIONS: carrying, lifting, bending, standing, squatting, stairs, transfers, and locomotion level  PARTICIPATION LIMITATIONS: meal prep, cleaning, laundry, shopping, and community activity  PERSONAL FACTORS: Age, Behavior pattern, Education, Past/current experiences, Time since onset of injury/illness/exacerbation, and 3+ comorbidities: parkinson's, spinal stenosis, DM, Htn, h/o falls  are also affecting patient's functional outcome.   REHAB POTENTIAL: Good  CLINICAL DECISION MAKING: Evolving/moderate complexity  EVALUATION COMPLEXITY: Moderate   GOALS: Goals reviewed with patient? Yes  SHORT TERM GOALS: Target date: 2 weeks 05/30/23 I HEP Baseline: Goal status: MET- 06/02/23   LONG TERM GOALS: Target date: 07/11/23: 8 weeks  Gait speed, increase from 0.6 m/sec to 1 m/sec, safely with LAD for improved community ambulation Baseline:  Goal status: INITIAL  2.  Gait distance improve from 23' to over 300' without fatigue, with LAD for improved household and community ambulation Baseline:  Goal status: INITIAL  3.  30 x sit to stand improve from 10 reps to 13 reps Baseline:  Goal status: INITIAL  4.  L  knee ext  improve from -6 to -3, maintain L knee flexion 122 or greater Baseline:  Goal status: PROGRESSING- 06/08/23  5.  Strength improve L quads to 5/5 for improved gait tolerance and transfers from multiple surfaces Baseline: 4/5 Goal status: INITIAL   PLAN:  PT FREQUENCY: 2x/week  PT DURATION: 8 weeks  PLANNED INTERVENTIONS: Therapeutic exercises, Therapeutic activity, Neuromuscular re-education, Balance training, Gait training, Patient/Family education, Self Care, and Joint mobilization  PLAN FOR NEXT SESSION: Monitor BP throughout, cardiovascular machines, gross LE strengthening to tolerance, stability and balance    Darleene Cleaver, PTA 06/08/2023, 9:32 AM

## 2023-06-13 ENCOUNTER — Other Ambulatory Visit: Payer: Self-pay

## 2023-06-13 ENCOUNTER — Ambulatory Visit: Payer: Medicare HMO

## 2023-06-13 DIAGNOSIS — Z96651 Presence of right artificial knee joint: Secondary | ICD-10-CM

## 2023-06-13 DIAGNOSIS — R2681 Unsteadiness on feet: Secondary | ICD-10-CM

## 2023-06-13 DIAGNOSIS — M6281 Muscle weakness (generalized): Secondary | ICD-10-CM

## 2023-06-13 DIAGNOSIS — R2689 Other abnormalities of gait and mobility: Secondary | ICD-10-CM | POA: Diagnosis not present

## 2023-06-13 DIAGNOSIS — R293 Abnormal posture: Secondary | ICD-10-CM

## 2023-06-13 NOTE — Therapy (Signed)
OUTPATIENT PHYSICAL THERAPY TREATMENT   Patient Name: Ronald [name] MRN: [id number] DOB:07-26-1951, 72 y.o., male Today's Date: 06/13/2023  END OF SESSION:  PT End of Session - 06/13/23 1022     Visit Number 9    Number of Visits 12    Date for PT Re-Evaluation 07/11/23    Authorization Type [employer]    Authorization Time Period 05/16/2023-04/14/2023    Progress Note Due on Visit 10    PT Start Time 1017    PT Stop Time 1100    PT Time Calculation (min) 43 min    Activity Tolerance Patient tolerated treatment well    Behavior During Therapy [facility] for tasks assessed/performed               Past Medical History:  Diagnosis Date   Anemia    Arthritis    Bell's palsy    CAD in native artery 06/04/2020   RCA infarct 2013.  S/p RCA and OM PCI   Diabetes mellitus type 2 in nonobese (HCC) 06/04/2020   Diabetes mellitus without complication (HCC)    Essential hypertension 06/04/2020   Floaters in visual field    Hypertension    Myocardial infarction [facility])    Parkinson's disease    Parkinson's disease 06/04/2020   Pure hypercholesterolemia 06/04/2020   Past Surgical History:  Procedure Laterality Date   APPENDECTOMY     back epidural   08/2017   BACK SURGERY     CORONARY ANGIOPLASTY WITH STENT PLACEMENT  08/19/2012   KNEE SURGERY     TOTAL KNEE ARTHROPLASTY Left 04/21/2023   Procedure: LEFT TOTAL KNEE ARTHROPLASTY;  Surgeon: Kathryne Hitch, MD;  Location: [facility];  Service: Orthopedics;  Laterality: Left;   Patient Active Problem List   Diagnosis Date Noted   Status post total left knee replacement 04/21/2023   High risk medication use 10/28/2021   Olecranon bursitis, left elbow 04/21/2021   Personal history of COVID-19 04/13/2021   Primary osteoarthritis, 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   Essential hypertension 06/04/2020   Pure hypercholesterolemia 06/04/2020   CAD in native artery 06/04/2020   Parkinson's disease 06/04/2020   Diabetes mellitus type 2  in nonobese (HCC) 06/04/2020   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   Iron deficiency anemia 11/14/2019   Unilateral primary osteoarthritis, left knee 04/01/2019   Chronic left-sided low back pain with left-sided sciatica 04/01/2019   Lumbar radiculopathy 08/21/2017   Chronic pain syndrome 08/04/2017   Facet arthropathy 08/04/2017   Lumbar paraspinal muscle spasm 08/04/2017   Anemia of chronic disease 07/03/2017   Dizziness 11/10/2016   Hyperlipidemia, unspecified 10/27/2016   Other intervertebral disc degeneration, lumbar region 06/30/2016   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 heart with stable angina pectoris (HCC) 02/26/2016   SOB (shortness of breath) 02/26/2016   Old MI (myocardial infarction) 02/26/2016   OAB (overactive bladder) 09/05/2013   Ischemic heart disease 09/21/2012   Bell's palsy 06/14/2012    PCP: Woodroe Chen, MD  REFERRING PROVIDER: Doneen Poisson, MD  REFERRING DIAG: s/p L TKA due to osteoarthritis L knee  THERAPY DIAG:  Other abnormalities of gait and mobility  Status post total right knee replacement  Unsteadiness on feet  Muscle weakness (generalized)  Abnormal posture  Rationale for Evaluation and Treatment: Rehabilitation  ONSET DATE: 04/21/23  SUBJECTIVE:   SUBJECTIVE STATEMENT: Just feels like my hips are really weak.  Affects my walking a lot PERTINENT HISTORY: Degenerative changes L knee, underwent elective TKA PAIN:  Are  you having pain? Yes: NPRS scale: 0/10 Pain location: L knee Pain description: hurts with activity Aggravating factors: gait, therex Relieving factors: pain pills, ice  PRECAUTIONS: Fall  RED FLAGS: None   WEIGHT BEARING RESTRICTIONS: No  FALLS:  Has patient fallen in last 6 months? Yes. Number of falls multiple  LIVING ENVIRONMENT: Lives with: lives with their spouse Lives in: House/apartment Stairs: No Has following equipment at home: Single point cane and Environmental consultant - 2  wheeled  OCCUPATION: retired  PLOF: Independent with basic ADLs, Independent with household mobility with device, Independent with transfers, Requires assistive device for independence, and Needs assistance with homemaking  PATIENT GOALS: return to gait with cane  NEXT MD VISIT: mid september  OBJECTIVE:   DIAGNOSTIC FINDINGS: na  PATIENT SURVEYS:  KOOS 12 72.9%  COGNITION: Overall cognitive status: Within functional limits for tasks assessed     SENSATION: Light touch: Impaired L plantar foot  EDEMA:  Mild L knee ant  MUSCLE LENGTH: Hamstrings: Right -25 deg; Left -30 deg  POSTURE: in standing weight bearing on B forefeet, forward lean, B hips and knees flexed slightly, thoracic kyphosis noted  PALPATION: Edematous L ant knee primarily, well healing scar L ant knee, raised, normal color, some warmth L knee as expected, patellar mobility normal  LOWER EXTREMITY ROM:  AA ROM Right eval Left eval Left 06/08/23  Hip flexion     Hip extension     Hip abduction     Hip adduction     Hip internal rotation     Hip external rotation     Knee flexion  122 118  Knee extension 0 -6 3  Ankle dorsiflexion     Ankle plantarflexion     Ankle inversion     Ankle eversion      (Blank rows = wfl)  LOWER EXTREMITY MMT: L SLR no lag, seated long arc quad L -22  MMT Right eval Left eval  Hip flexion 4+ 4-  Hip extension    Hip abduction    Hip adduction    Hip internal rotation    Hip external rotation    Knee flexion 4+ 4  Knee extension 4+ 4  Ankle dorsiflexion 3- 3-  Ankle plantarflexion 3- 2+  Ankle inversion    Ankle eversion     (Blank rows = not tested)  LOWER EXTREMITY SPECIAL TESTS:  NA  FUNCTIONAL TESTS:  30 seconds chair stand test 10 reps 10 meter walk test: 0.6 m/sec  GAIT: Distance walked: 90' Assistive device utilized: Environmental consultant - 2 wheeled Level of assistance: SBA Comments: B foot drop/slap, trendelenberg L, more pronounced when fatigued,  flexed posture, narrow support, with near scissoring.   TODAY'S TREATMENT:                                                                                                                              DATE: 06/13/23:Therapeutic ex:  instructed and progressed in the following  to improve strength, stability , function LE's Nustep level 5, 6 min Ue's and LE's BATCA leg press : 45#, 3 x 15 Standing for 3 way hip 10 x each leg BATCA B knee ext 20# 3x15 BATCA B knee flex 25# 3 x 15  06/08/23 Bike L3x68min BP checked before exercises: 108/76- continued with exercise as patient non symptomatic and reports always having low BP Knee extension 25# 2x10  Knee flexion 25# 2x10 Squats 2 x 10  Toe taps onto 6' step 2x10 L 06/06/23: Bike level 5, 6 min BP 116/72 taken after 2 min seated rest following bike Seated B knee flexion on BATCA 25#, 2 x 10 Seated B knee ext BATCA 20# B LE's 2 x 10 Seated unilateral knee ext 10#, 10 x 1 set each leg Standing, penguins and for/back rocks with knees extended with red t band around thighs to fatigue Patient c/o feeling dizzy, fatigued so rechecked BP and it was 110/58  Moved to mat table for supine bridging with feet over 65 cm physio ball and for supine alt SLR with feet on physioball, 10 reps each   06/02/23 Bike L2x62min Sidesteps 3x down and back along counter  Retro step/ ant/post WS with B UE support x 12 each Sit to stands x 10 with UE support Standing balance with EC and feet together 2 trials with CGA Knee flexion 25# 2x10 BLE Knee extension 20# BLE 2x10; 10# LLE 2x10  05/30/23: Therapeutic exercise: Recumbent cycle, 6 min, level 3 Checked seated BP after completing recumbent cycle: 110/64  Standing at counter for penguins and ant post rocks each leg, red t band around thighs, mass practice Lateral heel taps R from 2" block to isolate fine motor control, eccentrics L quads  Standing at counter for B heel raises, 20x BATCA B hamstring curls 20#,  15 x 2 BATCA B long arc quads, 20# 10 x 3 Reassessed BP  124/70   05/25/23: Therapeutic exercise: instructed in the following exercises designed to address b LE strength and stability and L knee flexibility: Nustep 6 min level 5 Supine, moist heat under lumbar region with physioball(green, 65cm) under thighs,for 15 reps each:  LTR Alt SLR B knee to chest(heels on ball) Bridging Supine hooklying for hip abd/ER 15x, blue band Long arc quads 5#, alternating legs 10 reps, 3 sets  Standing for hip abduction, 15 x each at counter Attempted hip ext L but became weak, requested sit down, monitored BP at  BP 82/48  once sitting Provided with glass of water, then assisted patient into supine position.  Monitored BP after 5 min supine: 122/60 Assisted to sitting and monitored after 3 min, 104/60      05/24/23 Bike L2x44min Seated L LAQ x 15 Supine Quad set 10x5" L Supine SLR x 10 L Supine LTR x 10 both ways Supine hip ADD ball squeeze 2x10 Supine heel slide with RTB x 12 L  05/19/23 Nustep L4x23min Standing hip abduction x 10 B Standing hip extension x 10 B Heel raise x 10 B very challenging with R LE Standing marches x 10 B Seated R LAQ x 10  Seated ball squeeze 10x3" Seated hamstring curls RTB x 10  Manual therapy: Scar mobilization to distal scar   05/16/23  Evaluation, reviewed his current home program with home health, which included standing toe raises, standing hip abd, ext, and flexion, mini squats Advised to perform supine SLR 15 reps, as this ex was quite difficult for him  PATIENT EDUCATION:  Education details: HEP update- see below Person educated: Patient Education method: Explanation, Demonstration, and Tactile cues Education comprehension: verbalized understanding  HOME EXERCISE PROGRAM: Access Code: [email or web address] URL: [email or web address] Date: 05/19/2023 Prepared by: Verta Ellen  Exercises - Seated Hamstring Stretch with Chair  - 1 x daily - 7  x weekly - 3 sets - 30 seconds to 1 min hold - Seated Long Arc Quad  - 1 x daily - 7 x weekly - 3 sets - 10 reps - 3 sec hold - Seated Hip Adduction Squeeze with Ball  - 1 x daily - 7 x weekly - 3 sets - 10 reps - 3 sec hold  ASSESSMENT:  CLINICAL IMPRESSION: Pt able to progress with bulk strengthening B LE's , not dizzy today.  Recovering very well from knee replacement.  Marked trendelenburg L hip which contributes to his stability with gait.  No pain R knee with today;s Rx. No episodes of dizziness either.  He is nearing the completion of his planned course of PT , will be reassessed next visit for progress.  Is to be complete Oct 22 if not before then.  OBJECTIVE IMPAIRMENTS: Abnormal gait, decreased activity tolerance, decreased balance, decreased coordination, decreased endurance, decreased mobility, difficulty walking, decreased ROM, decreased strength, increased edema, impaired flexibility, impaired sensation, postural dysfunction, and pain.   ACTIVITY LIMITATIONS: carrying, lifting, bending, standing, squatting, stairs, transfers, and locomotion level  PARTICIPATION LIMITATIONS: meal prep, cleaning, laundry, shopping, and community activity  PERSONAL FACTORS: Age, Behavior pattern, Education, Past/current experiences, Time since onset of injury/illness/exacerbation, and 3+ comorbidities: parkinson's, spinal stenosis, DM, Htn, h/o falls  are also affecting patient's functional outcome.   REHAB POTENTIAL: Good  CLINICAL DECISION MAKING: Evolving/moderate complexity  EVALUATION COMPLEXITY: Moderate   GOALS: Goals reviewed with patient? Yes  SHORT TERM GOALS: Target date: 2 weeks 05/30/23 I HEP Baseline: Goal status: MET- 06/02/23   LONG TERM GOALS: Target date: 07/11/23: 8 weeks  Gait speed, increase from 0.6 m/sec to 1 m/sec, safely with LAD for improved community ambulation Baseline:  Goal status: INITIAL  2.  Gait distance improve from 44' to over 300' without fatigue,  with LAD for improved household and community ambulation Baseline:  Goal status: INITIAL  3.  30 x sit to stand improve from 10 reps to 13 reps Baseline:  Goal status: INITIAL  4.  L  knee ext improve from -6 to -3, maintain L knee flexion 122 or greater Baseline:  Goal status: PROGRESSING- 06/08/23  5.  Strength improve L quads to 5/5 for improved gait tolerance and transfers from multiple surfaces Baseline: 4/5 Goal status: INITIAL   PLAN:  PT FREQUENCY: 2x/week  PT DURATION: 8 weeks  PLANNED INTERVENTIONS: Therapeutic exercises, Therapeutic activity, Neuromuscular re-education, Balance training, Gait training, Patient/Family education, Self Care, and Joint mobilization  PLAN FOR NEXT SESSION: Monitor BP throughout, cardiovascular machines, gross LE strengthening to tolerance, stability and balance    Jeannelle Wiens L Oren Barella, PT, DPT, OCS 06/13/2023, 11:45 AM

## 2023-07-03 ENCOUNTER — Encounter (HOSPITAL_BASED_OUTPATIENT_CLINIC_OR_DEPARTMENT_OTHER): Payer: Self-pay | Admitting: Family

## 2023-07-03 ENCOUNTER — Ambulatory Visit (HOSPITAL_BASED_OUTPATIENT_CLINIC_OR_DEPARTMENT_OTHER): Payer: Medicare HMO | Admitting: Family

## 2023-07-03 VITALS — BP 120/68 | HR 66 | Ht 68.0 in | Wt 182.2 lb

## 2023-07-03 DIAGNOSIS — I25118 Atherosclerotic heart disease of native coronary artery with other forms of angina pectoris: Secondary | ICD-10-CM | POA: Diagnosis not present

## 2023-07-03 DIAGNOSIS — E785 Hyperlipidemia, unspecified: Secondary | ICD-10-CM

## 2023-07-03 DIAGNOSIS — I10 Essential (primary) hypertension: Secondary | ICD-10-CM | POA: Diagnosis not present

## 2023-07-03 NOTE — Progress Notes (Signed)
Cardiology Office Note:  .   Date:  07/03/2023  ID:  Ronald [name], DOB 1951/03/30, MRN [id number] PCP: Woodroe Chen, MD  [facility] Providers Cardiologist:  None    History of Present Illness: .   Ronald [name] is a 72 y.o. male with history of CAD s/p MI 2013 with 3 stents to RCA and OM, diabetes, hyperlipidemia, hypertension, Parkinson's, depression.  Prior myalgias on simvastatin was switched to rosuvastatin.  Myoview 02/2018 mild posterior lateral ischemia and LVEF 65%.  Echo 06/2020 LVEF 60 to 65%, normal diastolic function, aortic valve sclerosis without stenosis.  Some orthostatic symptoms and lisinopril was reduced.  Last seen 10/31/2022 with echo due to dyspnea performed 11/03/2022 with LVEF 60 to 65%, no RWMA, indeterminate diastolic parameters, RV normal, mild MR.  Presents today for follow-up.  Since last seen had left knee surgery 04/21/2023.  Still feels a bit "wobbly "after surgery and is participating in PT.  Exertional dyspnea with more than usual activity but this is stable at baseline.  No chest pain, pressure, tightness.  No edema, orthopnea, PND.  Checks BP very intermittently at home with labile readings both hyper and hypotensive.  No near-syncope, syncope.  ROS: Please see the history of present illness.    All other systems reviewed and are negative.   Studies Reviewed: .        Cardiac Studies & Procedures       ECHOCARDIOGRAM  ECHOCARDIOGRAM COMPLETE 11/03/2022  Narrative ECHOCARDIOGRAM REPORT    Patient Name:   Ronald [name] Date of Exam: 11/03/2022 Medical Rec #:  [id number]       Height:       67.0 in Accession #:    [id number]      Weight:       181.0 lb Date of Birth:  08/22/1951       BSA:          1.938 m Patient Age:    71 years        BP:           110/74 mmHg Patient Gender: M               HR:           69 bpm. Exam Location:  [address]  Procedure: 2D Echo, Cardiac Doppler and Color Doppler  Indications:    R53.83  Fatigue; R60.0 Lower extremity edema; R06.02 SOB  History:        Patient has prior history of Echocardiogram examinations, most recent 06/29/2020. CAD and Previous Myocardial Infarction; Risk Factors:Hypertension, Diabetes, Dyslipidemia and Former Smoker. Parkinson's disease.  Sonographer:    Cathie Beams RCS Referring Phys: [phone] TESSA N CONTE  IMPRESSIONS   1. Left ventricular ejection fraction, by estimation, is 60 to 65%. The left ventricle has normal function. The left ventricle has no regional wall motion abnormalities. Left ventricular diastolic parameters are indeterminate. 2. Right ventricular systolic function is normal. The right ventricular size is normal. 3. The mitral valve is normal in structure. Mild mitral valve regurgitation. No evidence of mitral stenosis. 4. The aortic valve is normal in structure. Aortic valve regurgitation is not visualized. No aortic stenosis is present. 5. The inferior vena cava is normal in size with greater than 50% respiratory variability, suggesting right atrial pressure of 3 mmHg.  FINDINGS Left Ventricle: Left ventricular ejection fraction, by estimation, is 60 to 65%. The left ventricle has normal function. The left ventricle has no regional wall motion abnormalities. The left  ventricular internal cavity size was normal in size. There is no left ventricular hypertrophy. Left ventricular diastolic parameters are indeterminate.  Right Ventricle: The right ventricular size is normal. No increase in right ventricular wall thickness. Right ventricular systolic function is normal.  Left Atrium: Left atrial size was normal in size.  Right Atrium: Right atrial size was normal in size.  Pericardium: There is no evidence of pericardial effusion.  Mitral Valve: The mitral valve is normal in structure. Mild mitral valve regurgitation. No evidence of mitral valve stenosis.  Tricuspid Valve: The tricuspid valve is normal in structure. Tricuspid  valve regurgitation is mild . No evidence of tricuspid stenosis.  Aortic Valve: The aortic valve is normal in structure. Aortic valve regurgitation is not visualized. No aortic stenosis is present.  Pulmonic Valve: The pulmonic valve was normal in structure. Pulmonic valve regurgitation is trivial. No evidence of pulmonic stenosis.  Aorta: The aortic root is normal in size and structure.  Venous: The inferior vena cava is normal in size with greater than 50% respiratory variability, suggesting right atrial pressure of 3 mmHg.  IAS/Shunts: No atrial level shunt detected by color flow Doppler.   LEFT VENTRICLE PLAX 2D LVIDd:         4.60 cm   Diastology LVIDs:         2.70 cm   LV e' medial:    8.49 cm/s LV PW:         0.70 cm   LV E/e' medial:  10.3 LV IVS:        0.90 cm   LV e' lateral:   9.79 cm/s LVOT diam:     2.00 cm   LV E/e' lateral: 8.9 LV SV:         75 LV SV Index:   39 LVOT Area:     3.14 cm   RIGHT VENTRICLE RV Basal diam:  3.30 cm RV S prime:     15.80 cm/s TAPSE (M-mode): 3.1 cm  LEFT ATRIUM             Index        RIGHT ATRIUM           Index LA diam:        3.70 cm 1.91 cm/m   RA Area:     16.40 cm LA Vol (A2C):   63.2 ml 32.61 ml/m  RA Volume:   42.60 ml  21.98 ml/m LA Vol (A4C):   56.0 ml 28.89 ml/m LA Biplane Vol: 64.9 ml 33.48 ml/m AORTIC VALVE LVOT Vmax:   113.00 cm/s LVOT Vmean:  62.200 cm/s LVOT VTI:    0.238 m  AORTA Ao Root diam: 3.30 cm Ao Asc diam:  3.90 cm  MITRAL VALVE MV Area (PHT): 3.72 cm    SHUNTS MV Decel Time: 204 msec    Systemic VTI:  0.24 m MV E velocity: 87.50 cm/s  Systemic Diam: 2.00 cm MV A velocity: 98.00 cm/s MV E/A ratio:  0.89  Kardie Tobb DO Electronically signed by Thomasene Ripple DO Signature Date/Time: 11/03/2022/2:57:19 PM    Final             Risk Assessment/Calculations:             Physical Exam:   VS:  BP 120/68 (BP Location: Left Arm, Patient Position: Sitting, Cuff Size: Normal)   Pulse 66    Ht 5\' 8"  (1.727 m)   Wt 182 lb 3.2 oz (82.6 kg)   SpO2 97%  BMI 27.70 kg/m    Wt Readings from Last 3 Encounters:  07/03/23 182 lb 3.2 oz (82.6 kg)  04/21/23 175 lb 11.3 oz (79.7 kg)  04/11/23 175 lb 9.6 oz (79.7 kg)    GEN: Well nourished, well developed in no acute distress NECK: No JVD; No carotid bruits CARDIAC: RRR, no murmurs, rubs, gallops RESPIRATORY:  Clear to auscultation without rales, wheezing or rhonchi  ABDOMEN: Soft, non-tender, non-distended EXTREMITIES:  No edema; No deformity   ASSESSMENT AND PLAN: .    CAD / HLD, LDL goal <70 - Stable with no anginal symptoms. No indication for ischemic evaluation.  GDMT includes Aspirin, Rosuvastatin, Toprol. Recommend aiming for 150 minutes of moderate intensity activity per week and following a heart healthy diet.    HTN - BP well controlled. Continue current antihypertensive regimen.         Dispo: follow up in 6 months  Signed, Alver Sorrow, NP

## 2023-07-03 NOTE — Patient Instructions (Signed)
Medication Instructions:  Your physician recommends that you continue on your current medications as directed. Please refer to the Current Medication list given to you today.  Follow-Up: At [facility], you and your health needs are our priority.  As part of our continuing mission to provide you with exceptional heart care, we have created designated Provider Care Teams.  These Care Teams include your primary Cardiologist (physician) and Advanced Practice Providers (APPs -  Physician Assistants and Nurse Practitioners) who all work together to provide you with the care you need, when you need it.  We recommend signing up for the patient portal called "MyChart".  Sign up information is provided on this After Visit Summary.  MyChart is used to connect with patients for Virtual Visits (Telemedicine).  Patients are able to view lab/test results, encounter notes, upcoming appointments, etc.  Non-urgent messages can be sent to your provider as well.   To learn more about what you can do with MyChart, go to [email or web address].    Your next appointment:   6 months with Dr. Duke Salvia or Gillian Shields, NP

## 2023-07-17 ENCOUNTER — Encounter (HOSPITAL_BASED_OUTPATIENT_CLINIC_OR_DEPARTMENT_OTHER): Payer: Self-pay | Admitting: Cardiovascular Disease

## 2023-07-17 NOTE — Telephone Encounter (Signed)
Please advise if patient needs dental prophylaxis

## 2023-08-14 ENCOUNTER — Encounter: Payer: Self-pay | Admitting: Orthopaedic Surgery

## 2023-08-14 ENCOUNTER — Other Ambulatory Visit: Payer: Self-pay | Admitting: Adult Health

## 2023-08-30 ENCOUNTER — Ambulatory Visit: Payer: Medicare HMO | Admitting: Orthopaedic Surgery

## 2023-09-05 ENCOUNTER — Ambulatory Visit: Payer: Medicare HMO | Admitting: Neurology

## 2023-09-05 ENCOUNTER — Encounter: Payer: Self-pay | Admitting: Neurology

## 2023-09-05 VITALS — BP 186/94 | HR 59 | Temp 99.0°F | Ht 69.0 in | Wt 182.0 lb

## 2023-09-05 DIAGNOSIS — Z9181 History of falling: Secondary | ICD-10-CM

## 2023-09-05 DIAGNOSIS — R269 Unspecified abnormalities of gait and mobility: Secondary | ICD-10-CM

## 2023-09-05 DIAGNOSIS — M5441 Lumbago with sciatica, right side: Secondary | ICD-10-CM

## 2023-09-05 DIAGNOSIS — R296 Repeated falls: Secondary | ICD-10-CM

## 2023-09-05 DIAGNOSIS — G20B2 Parkinson's disease with dyskinesia, with fluctuations: Secondary | ICD-10-CM | POA: Diagnosis not present

## 2023-09-05 DIAGNOSIS — M545 Low back pain, unspecified: Secondary | ICD-10-CM

## 2023-09-05 DIAGNOSIS — G8929 Other chronic pain: Secondary | ICD-10-CM

## 2023-09-05 NOTE — Progress Notes (Signed)
Subjective:    Patient ID: Ronald [name] is a 72 y.o. male.  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  The patient is accompanied by his wife again today.  He was last seen in our clinic by Ronald Penny, NP on 03/01/2023, at which time he reported more trouble with his gait and balance.  He sustained some falls.  He was advised to continue with his C/L at 2 pills 5 times a day, stay well-hydrated and proceed with physical therapy.  A referral to PT was made.  Today, 09/05/2023: He reports ongoing issues with low back pain.  He had an MRI of the lumbar spine through orthopedics recently and it showed multilevel degenerative changes including neuroforaminal stenosis on multiple levels and lumbar spinal stenosis.  He had an epidural injection in the past.  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  He had left knee replacement with Dr. Magnus [name] in August 2024 and it is coming along okay but he has right knee pain.  He has fallen a few times, thankfully without any major injuries.  He uses a 2 wheeled walker inside the house and an upright walker outside the home to help alleviate some of the pressure in the lower back.  He has muscle spasms but is no longer on a muscle relaxer, he tried back send most recently.  He takes Aleve as needed.  He is not on any narcotic pain medication.  He has a tendency to be more interactive now per wife.  He sits more.  He does not exercise as regularly.  He does have mild dyskinesias in the lower  extremities.  He takes his levodopa 2 pills alternating with 1 pill every 3 hours starting at 9 AM.  Last 2 pills are at 9 PM.  Total dose is 8 pills/day.  Constipation is intermittent.    The patient's allergies, current medications, family history, past medical history, past social history, past surgical history and problem list were reviewed and updated as appropriate.    Previously (copied from previous notes for reference):    03/01/23 Ronald Penny, NP): <<Ronald [name] is a 72 y.o. male who has been followed in this office for Parkinson's disease. Returns today for follow-up.  He reports that he has had more trouble with his gait and balance.  Has had approximately 6 falls.  Currently using a cane when ambulating.  Reports that it is rare that he has a tremor.  But he has noticed it in the upper extremities at times.  Reports that he is sleeping well.  His wife reports that sometimes he is acting out his dreams at night.  He has had 1 occasion that he fell out of bed.  He remains on Sinemet.  They may be interested in research program specifically asking about [facility].>> 08/18/22: He reports reports feeling stable, still has back pain but tolerates it.  He uses a cane, has avoided using walker.  His birthday is tomorrow.  His wife is currently in [place], [place] to be with her uncle who is 92 years  old and in the hospital.  Her mom is also there currently.  Wife's uncle is going to be honored for being the first Geologist, engineering at [employer] in [place], they are going to build a statue and put it on campus, unfortunately he is in the hospital right now. Patient has had some balance issues and stumbles, he has wondered about a walker but his wife has felt that he should not get a walker so he has avoided it.  His wife will need hip replacement surgery soon.  He continues to take Sinemet 1-1/2 pills 5 times a day, 3 hourly starting at 9 AM.  He would be  willing to go to 2 pills alternating with 1 to avoid cutting the pills in half. I saw him on 02/10/2022, at which time he reported feeling fairly stable from the Parkinson's standpoint, he was taking Sinemet 1-1/2 pills 5 times a day but it was difficult to cut the pills in half.  He had a cystoscopy with Botox under urology on 01/21/2022, but still had significant nocturia about 4-5 times per average night.  Also had ongoing issues with low back pain.      I saw him on 10/07/2021, at which time he reported that increasing Sinemet to 4 times a day caused his joint pain to be worse.  He was on 2 pills 3 times daily.  I suggested he change his Sinemet regimen to 1-1/2 pills 5 times a day starting at 9 AM, 3 hourly. He had recently started [facility] per Cardiology.     I saw him on 06/03/2021, at which time he reported significant left knee pain.  He was utilizing a prescription knee brace.  He was using a cane.  He had a recent fall, landed on his back but did not hit his head and sustained no serious injuries thankfully.  Constipation was under decent control.  He was advised to increase his Sinemet from 1-1/2 pills 4 times daily to 2 pills in the morning and 2 pills in the evening, maintain 1-1/2 pills for the midday doses.    I saw him on 01/27/2021, at which time he reported doing fairly well, he had ongoing issues with knee pain and low back pain.  He reported having fallen.  He had not used a walker and he was not keen on starting to use a walker.  We talked about potentially utilizing Nourianz.     I saw him on 07/30/2020, at which time he reported feeling fairly stable.  He was taking Sinemet 2 pills 3 times daily.  He was trying to stay active and go to the gym about twice a week.  He reported occasional coughing when drinking water but did not want to seek formal evaluation with speech therapy or swallow study.  He had noticed some swelling around his ankles.  He was switched by his cardiologist from  Zocor to Crestor. He was advised to continue with generic Sinemet 2 pills 3 times daily.   I saw him on 01/28/2020, at which time he reported ongoing issues with intermittent constipation.  He had fallen a couple of times but thankfully without any major injuries.  We talked about the importance of fall prevention and constipation control.  He was not able to tolerate Sinemet at 2 pills 4 times daily and had scaled back to 2 pills 3 times daily.  He was advised to continue with this. He was advised to use his cane consistently.  I saw him on 09/30/2019, at which time he reported that his balance was worse and he had 2 falls because his left knee gave out.  He had arthritis issues with the knees and is seen orthopedics.  He had seen neurosurgery in [place] in September 2020.  He felt that overall his slowness was worse.  He was advised to increase his Sinemet to 2 pills 4 times daily from tid.      I saw him on 05/30/2019, at which time he reported ongoing problems with his low back pain.  He had tried medication including narcotic pain medication, muscle relaxer and gabapentin.  He was supposed to have a neurosurgical evaluation soon.  He had problems with constipation.  He was still taking Sinemet 2 pills 3 times daily with reasonably good results.  He also reported overall feeling more stressed.  He was advised to be more proactive about constipation issues and try to be more active physically with the help of a recumbent stationary bike if possible.   I saw him on 11/27/2018, at which time he was complaining of low back pain.  He had lumbar spine x-rays in November 2019 which showed some degenerative changes.  He had received a lumbar spine injection under Dr. Cleophas [name] in December which he felt was not that helpful.  He felt that the back pain was enough to limit his exercise.  He had not pursued his hobby of making pens very much either.  He was advised to continue with Sinemet 2 pills 3 times daily.   He was advised to try gabapentin low dose with gradual increase to up to 100 mg 3 times daily.    I saw him on 05/29/2018, at which time he reported having done well after interim back surgery on 03/06/2018. He also had radiofrequency surgery prior to that. He reported occasional dizziness and nausea, some loss of appetite. I suggested we change his Sinemet from 2 pills 3 times a day to 1-1/2 pills 4 times a day. He reported falls. He was physically quite active, was going to the [facility]. His falls primarily occurred when he was turning.    11/02/2017, at which time he reported more back pain. He had undergone an injection in December 2018 but had found no relief. [place]. He was active physically but had to take a break because of back pain. He was taking Mobic for back pain. He had a slow reduction and his hemoglobin and red cell counts and I counseled him on the daily use of meloxicam.     I saw him on 05/02/2017, at which time he was complaining of lower back pain. He had seen orthopedics for this. He also had right knee pain, status post injection. He had not fallen thankfully. He was taking Sinemet 2 pills 3 times a day.   I saw him on 10/31/2016, at which time he reported doing okay, no recent new symptoms, in particular no significant memory loss or mood disorder constipation. He did have an episode of chest pain recently. He saw his cardiologist. He felt to be a little dehydrated and blood pressure was on the lower end of normal. He was noted to have some orthostatic hypotension. He was started on northera 100 mg tid, but had not filled it yet. He was trying to stay active and going to the [facility] 3 times a week. He was also interested in pursuing boxing classes for PD. He had lost a little bit of weight  and reported some loss of appetite. I suggested he continue with Sinemet 2 pills 3 times a day. Advised to change positions slowly and be mindful of his blood pressure dropping potentially  with sudden changes of position and he was reminded to stay well-hydrated.   I first met him on 04/27/2016 at the request of his primary care physician, at which time he reported a prior diagnosis of Parkinson's disease in 2014 and symptoms dating back to early 2014. He was exercising regularly, he was doing well on symptomatic treatment with Sinemet and I suggested he continue with the medication, at 2 pills 3 times a day. He was reminded to stay active physically and mentally and stable hydrated and well-nourished.    04/27/2016: He was diagnosed with Parkinson's disease in 2014. Symptoms date back to beginning of 2014, and started with speech changes, was noted to dragging his feet.  He has been on Sinemet for the past 3 years, with gradual increase, currently 2 pills in the morning, 2 in the afternoon and 1 at night, was asked to reduce it, He is not sure why, seems to tolerate it well and believes it has helped. He is wondering however if there is any new medication available. He has not been on a dopamine agonist before from what I understand, no Azilect, no other MAO B inhibitor either.  He exercises regularly and is enrolled in the PD spinning class at 2 different [employer]. Lives with his wife of 37 years in [place], and they have 3 grown daughters, 2 GC. He is a nonsmoker, drinks alcohol infrequently, is a retired Environmental manager.  I reviewed your office note from 02/08/2016. He used to see Dr. Ala Dach, neurologist out of [facility] but Dr. Ala Dach retired. He then saw another neurologist in the same office but she left. Prior office records from his previous neurologists are not available for my review today. We will request office records from Dr. Marily Lente office, patient signed a ROI form today and is agreeable.   He denies any major memory or mood issues or sleep issues, does snore some, but no apneas reported, no hx of RBD. No FHx of PD.  He varies his C/L dose, 1st dose 6-9 AM, second dose around 12 or  1 PM and last dose around 8 PM.    He also plays golf, once or twice a week. He tries to stay well-hydrated, may not always drink enough water however.     His Past Medical History Is Significant For: Past Medical History:  Diagnosis Date   Anemia    Arthritis    Bell's palsy    CAD in native artery 06/04/2020   RCA infarct 2013.  S/p RCA and OM PCI   Diabetes mellitus type 2 in nonobese (HCC) 06/04/2020   Diabetes mellitus without complication (HCC)    Essential hypertension 06/04/2020   Floaters in visual field    Hypertension    Myocardial infarction [facility])    Parkinson's disease (HCC)    Parkinson's disease (HCC) 06/04/2020   Pure hypercholesterolemia 06/04/2020    His Past Surgical History Is Significant For: Past Surgical History:  Procedure Laterality Date   APPENDECTOMY     back epidural   08/2017   BACK SURGERY     CORONARY ANGIOPLASTY WITH STENT PLACEMENT  08/19/2012   KNEE SURGERY     TOTAL KNEE ARTHROPLASTY Left 04/21/2023   Procedure: LEFT TOTAL KNEE ARTHROPLASTY;  Surgeon: Kathryne Hitch, MD;  Location: [facility];  Service:  Orthopedics;  Laterality: Left;    His Family History Is Significant For: Family History  Problem Relation Age of Onset   Diabetes Mother    Sarcoidosis Mother    Stroke Father    Parkinson's disease Neg Hx     His Social History Is Significant For: Social History   Socioeconomic History   Marital status: Married    Spouse name: [name]    Number of children: 3   Years of education: Not on file   Highest education level: Not on file  Occupational History   Occupation: Retired   Tobacco Use   Smoking status: Former   Smokeless tobacco: Never  Advertising account planner   Vaping status: Never Used  Substance and Sexual Activity   Alcohol use: Yes    Comment: occ glass of wine for a special occasion   Drug use: No   Sexual activity: Not on file  Other Topics Concern   Not on file  Social History Narrative   Drinks about 1 cup of  coffee a day    Lives with wife Ronald [name]   Left handed   Social Drivers of Health   Financial Resource Strain: Low Risk  (05/15/2023)   Received from [facility]   Overall Financial Resource Strain (CARDIA)    Difficulty of Paying Living Expenses: Not hard at all  Food Insecurity: No Food Insecurity (05/15/2023)   [facility]   Hunger Vital Sign    Worried About Running Out of Food in the Last Year: Never true    Ran Out of Food in the Last Year: Never true  Transportation Needs: No Transportation Needs (05/15/2023)   Received from [facility] - Transportation    Lack of Transportation (Medical): No    Lack of Transportation (Non-Medical): No  Physical Activity: Unknown (05/15/2023)   Received from [facility]   Exercise Vital Sign    Days of Exercise per Week: Patient declined    Minutes of Exercise per Session: 10 min  Stress: No Stress Concern Present (05/15/2023)   Received from [facility] - Occupational Stress Questionnaire    Feeling of Stress : Not at all  Social Connections: Socially Integrated (05/15/2023)   Received from [facility]   Social Network    How would you rate your social network (family, work, friends)?: Good participation with social networks    His Allergies Are:  Allergies  Allergen Reactions   Sulfamethoxazole-Trimethoprim Swelling    Pt stated his lips became swollen  :   His Current Medications Are:  Outpatient Encounter Medications as of 09/05/2023  Medication Sig   aspirin EC 81 MG tablet Take 81 mg by mouth daily. Swallow whole.   carbidopa-levodopa (SINEMET IR) 25-100 MG tablet TAKE 2 TABLETS BY MOUTH AT 9AM, 1 TABLET AT 12PM NOON, 2 TABLETS AT 3PM, 1 TABLET AT 6PM, AND 2 TABLETS AT 9PM   metoprolol succinate (TOPROL-XL) 25 MG 24 hr tablet Take 25 mg by mouth daily.   naproxen sodium (ALEVE) 220 MG tablet Take 220 mg by mouth 2 (two) times daily as needed (pain).    rosuvastatin (CRESTOR) 20 MG tablet Take 1 tablet by mouth once daily   sertraline (ZOLOFT) 50 MG tablet Take 50 mg by mouth daily at 6 PM.   lisinopril (ZESTRIL) 5 MG tablet Take 1 tablet by mouth once daily (Patient not taking: Reported on 09/05/2023)   [DISCONTINUED] aspirin 81 MG chewable tablet  Chew 1 tablet (81 mg total) by mouth 2 (two) times daily. (Patient not taking: Reported on 09/05/2023)   [DISCONTINUED] methocarbamol (ROBAXIN) 500 MG tablet Take 500 mg by mouth as needed. (Patient not taking: Reported on 09/05/2023)   [DISCONTINUED] oxyCODONE (OXY IR/ROXICODONE) 5 MG immediate release tablet Take 1 tablet (5 mg total) by mouth every 6 (six) hours as needed for moderate pain (pain score 4-6). (Patient not taking: Reported on 09/05/2023)   No facility-administered encounter medications on file as of 09/05/2023.  :  Review of Systems:  Out of a complete 14 point review of systems, all are reviewed and negative with the exception of these symptoms as listed below:  Review of Systems  Neurological:        Patient is here with his wife for 6 month follow-up. He reports approximately 5-6 falls since his last visit in June. He has a new rollator that he uses out of the home. He uses his old walker and a cane at home. He has trouble with his turns. He denies any injuries with the falls. He had a L knee replacement on 04/21/23.    Objective:  Neurological Exam  Physical Exam Physical Examination:   Vitals:   09/05/23 1331 09/05/23 1334  BP: (!) 180/100 (!) 186/94  Pulse: (!) 59   Temp: 99 F (37.2 C)     General Examination: The patient is a very pleasant 72 y.o. male in no acute distress. He appears deconditioned.  Well-groomed.   HEENT: Normocephalic, atraumatic, pupils are equal, round and reactive to light, extraocular tracking is mild to moderately impaired, face is mildly asymmetric but stable, moderate facial masking and left hemifacial spasms noted, left blepharospasm  noted, mostly stable.  Moderate to severe hypophonia and mild intermittent dysarthria, stable.  Neck is moderately rigid.  Airway examination reveals stable findings, mild mouth dryness, adequate dental hygiene, tongue protrudes centrally and palate elevates symmetrically.  No obvious sialorrhea.  No carotid bruits.   Chest: Clear to auscultation without wheezing, rhonchi or crackles noted.   Heart: S1+S2+0, regular and normal without murmurs, rubs or gallops noted.    Abdomen: Soft, non-tender and non-distended.   Extremities: There is non-pitting puffiness in the lower extremities today.    Skin: Warm and dry without trophic changes noted.   Musculoskeletal: exam reveals right knee pain.  Status post left knee replacement.  Decreased range of motion in left shoulder.   Low back pain. Neurologically:  Mental status: The patient is awake, alert and oriented in all 4 spheres. His immediate and remote memory, attention, language skills and fund of knowledge are appropriate. There is no evidence of aphasia, agnosia, apraxia or anomia. Thought process is linear. Mood is normal and affect is normal.  Cranial nerves II - XII are as described above under HEENT exam.  Motor exam: Thinner muscle bulk, increased tone in the upper extremities, mild lower extremity dyskinesias.  Moderate impairment of fine motor skills. Overall moderate bradykinesia.  Intermittent resting tremor in both upper extremities. Gait, station, balance: He stands with mild to moderate difficulty, and pushes himself up, slightly wider stance, he has a moderately stooped posture, mild right upper body tilt.  He walks with an upright walker.  He maneuvers the walker quite well.      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  He had left knee replacement under Dr. Magnus [name].  He is advised to continue with his levodopa which is currently 2 pills alternating with 1 pill for a total of 5 doses, 8 pills/day.  Talked about the challenges of advancing Parkinson's disease and the importance of fall prevention again today.   He has mild dyskinesias.  We talked about the importance of staying active even when sitting down, he is encouraged to do some sit down exercises, utilizing a resistance band if possible.  He is advised stay well-hydrated and proactive about constipation issues.  He is encouraged to trial melatonin 5 to 10 mg at bedtime.  He is advised to follow-up to see the nurse practitioner in about 6 to 8 months, sooner if needed and make an appointment with orthopedics for his back pain.   I answered all their questions today and the patient and his wife were in agreement.  I spent 40 minutes in total face-to-face time and in reviewing records during pre-charting, more than 50% of which was spent in counseling and coordination of care, reviewing test results, reviewing medications and treatment regimen and/or in discussing or reviewing the diagnosis of PD, the prognosis and treatment options. Pertinent laboratory and imaging test results that were available during this visit with the patient were reviewed by me and considered in my medical decision making (see chart for details).

## 2023-09-05 NOTE — Patient Instructions (Addendum)
Please use a walker at all times.  Please follow-up with orthopedics for pain.  Your blood pressure is elevated today, likely due to your pain. We will maintain you on the levodopa, 2 pills alternating with 1 pill for total of 8 pills/day. You could trial melatonin again at night, 5 to 10 mg at bedtime. Please try to maintain some form of regular exercise even when sitting down, utilizing a resistance band.  Be proactive about constipation issues.

## 2023-09-16 ENCOUNTER — Other Ambulatory Visit: Payer: Self-pay | Admitting: Physician Assistant

## 2023-09-21 ENCOUNTER — Telehealth: Payer: Self-pay | Admitting: Physical Therapy

## 2023-09-21 DIAGNOSIS — M6281 Muscle weakness (generalized): Secondary | ICD-10-CM

## 2023-09-21 DIAGNOSIS — G20B2 Parkinson's disease with dyskinesia, with fluctuations: Secondary | ICD-10-CM

## 2023-09-21 DIAGNOSIS — R293 Abnormal posture: Secondary | ICD-10-CM

## 2023-09-21 DIAGNOSIS — R2681 Unsteadiness on feet: Secondary | ICD-10-CM

## 2023-09-21 NOTE — Telephone Encounter (Signed)
 Dr. Buck,  Mr. Life is scheduled for  PT return evaluation on 10/09/2023 as recommended when he was last discharged from therapy, due to progressive nature of diagnosis.  Pt was in agreement with this plan.  If you are in agreement, please send updated order for  PT via epic.  Thank you,  Greig Anon, PT 09/21/23 3:09 PM Phone: [phone] Fax: [phone]  [facility] at [facility] [address] [place], [address] [place], [place] [address] Phone # [phone] Fax # [phone]

## 2023-09-25 NOTE — Telephone Encounter (Signed)
 PT referral placed.

## 2023-10-09 ENCOUNTER — Ambulatory Visit: Payer: Medicare HMO | Attending: Neurology | Admitting: Physical Therapy

## 2023-10-19 NOTE — Therapy (Signed)
OUTPATIENT PHYSICAL THERAPY NEURO EVALUATION   Patient Name: Ronald [name] MRN: [id number] DOB:[birth year], 73 y.o., male Today's Date: 10/23/2023   PCP: Woodroe Chen, MD  REFERRING PROVIDER: Huston Foley, MD   END OF SESSION:  PT End of Session - 10/23/23 1200     Visit Number 1    Number of Visits 17    Date for PT Re-Evaluation 12/18/23    Authorization Type [employer]    Authorization Time Period --   auth submitted   PT Start Time [phone]    PT Stop Time 1016    PT Time Calculation (min) 45 min    Equipment Utilized During Treatment Gait belt    Activity Tolerance Patient tolerated treatment well    Behavior During Therapy WFL for tasks assessed/performed             Past Medical History:  Diagnosis Date   Anemia    Arthritis    Bell's palsy    CAD in native artery 06/04/2020   RCA infarct 2013.  S/p RCA and OM PCI   Diabetes mellitus type 2 in nonobese (HCC) 06/04/2020   Diabetes mellitus without complication (HCC)    Essential hypertension 06/04/2020   Floaters in visual field    Hypertension    Myocardial infarction [facility])    Parkinson's disease (HCC)    Parkinson's disease (HCC) 06/04/2020   Pure hypercholesterolemia 06/04/2020   Past Surgical History:  Procedure Laterality Date   APPENDECTOMY     back epidural   08/2017   BACK SURGERY     CORONARY ANGIOPLASTY WITH STENT PLACEMENT  08/19/2012   KNEE SURGERY     TOTAL KNEE ARTHROPLASTY Left 04/21/2023   Procedure: LEFT TOTAL KNEE ARTHROPLASTY;  Surgeon: Kathryne Hitch, MD;  Location: [facility];  Service: Orthopedics;  Laterality: Left;   Patient Active Problem List   Diagnosis Date Noted   Status post total left knee replacement 04/21/2023   High risk medication use 10/28/2021   Olecranon bursitis, left elbow 04/21/2021   Personal history of COVID-19 04/13/2021   Primary osteoarthritis, 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   Essential hypertension 06/04/2020   Pure hypercholesterolemia  06/04/2020   CAD in native artery 06/04/2020   Parkinson's disease (HCC) 06/04/2020   Diabetes mellitus type 2 in nonobese (HCC) 06/04/2020   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   Iron deficiency anemia 11/14/2019   Unilateral primary osteoarthritis, left knee 04/01/2019   Chronic left-sided low back pain with left-sided sciatica 04/01/2019   Lumbar radiculopathy 08/21/2017   Chronic pain syndrome 08/04/2017   Facet arthropathy 08/04/2017   Lumbar paraspinal muscle spasm 08/04/2017   Anemia of chronic disease 07/03/2017   Dizziness 11/10/2016   Hyperlipidemia, unspecified 10/27/2016   Other intervertebral disc degeneration, lumbar region 06/30/2016   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 heart with stable angina pectoris (HCC) 02/26/2016   SOB (shortness of breath) 02/26/2016   Old MI (myocardial infarction) 02/26/2016   OAB (overactive bladder) 09/05/2013   Ischemic heart disease 09/21/2012   Bell's palsy 06/14/2012    ONSET DATE: several years  REFERRING DIAG:  R26.81 (ICD-10-CM) - Unsteadiness on feet  M62.81 (ICD-10-CM) - Muscle weakness (generalized)  G20.B2 (ICD-10-CM) - Parkinson's disease with dyskinesia and fluctuating manifestations (HCC)  R29.3 (ICD-10-CM) - Abnormal posture    THERAPY DIAG:  Muscle weakness (generalized)  Unsteadiness on feet  Other abnormalities of gait and mobility  Other symptoms and signs involving the nervous system  Rationale for Evaluation and Treatment:  Rehabilitation  SUBJECTIVE:                                                                                                                                                                                             SUBJECTIVE STATEMENT: Patient reports that he has had at least a dozen falls in the past 6 months, occurring before and after his TKA in August. Tendency to fall backwards and reports that he has been able to get up from the ground independently. Reports  that the L knee is doing good; not having residual pain or buckling. Reports that his back is still a problem and has bulging discs. Reports N/T down R>L LEs but no radiation or B&B changes. reports that it is getting harder for him to put his pants on. Using [facility] when outside; not using AD inside the home. Reports occasional freezing episodes.   Pt accompanied by: self  PERTINENT HISTORY: CAD, DM, HTN, hx of back surgery; sciatica, L TKA 04/21/23  PAIN:  Are you having pain? Yes: NPRS scale: 5/10 Pain location: R LB Pain description: aching, shooting Aggravating factors: prolonged standing, lifting >25 lbs Relieving factors: taking some weight off  PRECAUTIONS: Fall  RED FLAGS: None   WEIGHT BEARING RESTRICTIONS: No  FALLS: Has patient fallen in last 6 months? Yes. Number of falls reports at least a dozen  LIVING ENVIRONMENT: Lives with: lives with their spouse Lives in: House/apartment Stairs:  2nd story with 6 steps upstairs and downstairs; 1 step to enter  Has following equipment at home: Single point cane, Walker - 2 wheeled, and upright walker, shower seat  PLOF: Independent  PATIENT GOALS: strengthening my legs and arms so I can lift things without falling   OBJECTIVE:  Note: Objective measures were completed at Evaluation unless otherwise noted.  DIAGNOSTIC FINDINGS: none recent  COGNITION: Overall cognitive status: Within functional limits for tasks assessed   SENSATION: Pt reports diminished sensation in B LEs but testing intact to light touch  COORDINATION: Alternating pronation/supination: very slightly slowed  Alternating toe tap: reduced ROM on R  Finger to nose: slight intention tremor B  MUSCLE TONE: slightly increased tone in R quad   POSTURE: L lean sitting in chair, flexed posture   LOWER EXTREMITY ROM:     Active  Right Eval Left Eval  Hip flexion    Hip extension    Hip abduction    Hip adduction    Hip internal rotation    Hip external  rotation    Knee flexion  106  Knee extension  2  Ankle dorsiflexion -9 1  Ankle plantarflexion  Ankle inversion    Ankle eversion     (Blank rows = not tested)  LOWER EXTREMITY MMT:    MMT (in sitting) Right Eval Left Eval  Hip flexion 4+ 5  Hip extension    Hip abduction 4 4+  Hip adduction 4 4+  Hip internal rotation    Hip external rotation    Knee flexion 4+ 4 *c/o muscle spasm in L toes  Knee extension 4+ 4+  Ankle dorsiflexion 2 4-  Ankle plantarflexion 2+ 4  Ankle inversion    Ankle eversion    (Blank rows = not tested)     GAIT: Gait pattern: audible R foot slap, R LE slightly scissoring, hip instability and imbalance  Assistive device utilized: Single point cane Level of assistance: CGA   FUNCTIONAL TESTS:  78M walk: 18.03 sec (1.82 ft/sec) 5xSTS: 23.84 sec some retropulsion on 1st 2 reps                                                                                                                               TREATMENT DATE: 10/23/23    PATIENT EDUCATION: Education details: prognosis, POC, edu on benefits of OT- pt reports he is interested; advised pt to f/u with spine MD d/t progression of R ankle weakness compared to PT eval in June 2024. Edu on risk of falls and potential of using AFO. Advised to use RW outside and indoors or at the very least a SPC indoors d/t fall frequency- pt agreeable Person educated: Patient Education method: Explanation, Demonstration, Tactile cues, Verbal cues, and Handouts Education comprehension: verbalized understanding and returned demonstration  HOME EXERCISE PROGRAM: Not initiated   GOALS: Goals reviewed with patient? Yes  SHORT TERM GOALS: Target date: 11/20/2023  Patient to be independent with initial HEP. Baseline: HEP initiated Goal status: INITIAL    LONG TERM GOALS: Target date: 12/18/2023  Patient to be independent with advanced HEP. Baseline: Not yet initiated  Goal status: INITIAL  Patient to  score at least 46/56 on Berg in order to decrease risk of falls.   Baseline: NT Goal status: INITIAL  Patient to demonstrate safe ambulation with mod I with LRAD on indoor and outdoor surfaces.  Baseline: currently unsteady with SPC indoors and requires CGA Goal status: INITIAL  Patient to demonstrate 5xSTS test in <15 sec in order to decrease risk of falls.   Baseline: 5xSTS: 23.84 sec some retropulsion on 1st 2 reps  Goal status: INITIAL  Patient to verbalize understanding of fall prevention in home environment information. Baseline: Not yet initiated Goal status: INITIAL  Patient to verbalize tips to reduce freezing/festination with gait and turns. Baseline: Not yet initiated  Goal status: INITIAL   ASSESSMENT:  CLINICAL IMPRESSION:  Patient is a 73 y/o M presenting to OPPT with c/o imbalance and falls occurring for at least the past 6 months. Of note, patient underwent L TKA since last PT POC at this clinic. Patient today  presenting with Slight discoordination, limited R>L ankle dorsiflexion AROM, marked R LE weakness, gait deviations, imbalance and increased time required with transfers and gait. Marked decline in R ankle strength and ROM was evident today- advised patient to f/u with his spine MD to discuss these changes as they are contributing to falls. Would benefit from skilled PT services 2 x/week for 8 weeks to address aforementioned impairments in order to optimize level of function.    OBJECTIVE IMPAIRMENTS: Abnormal gait, decreased balance, decreased coordination, difficulty walking, decreased ROM, decreased strength, decreased safety awareness, impaired flexibility, impaired tone, improper body mechanics, postural dysfunction, and pain.   ACTIVITY LIMITATIONS: carrying, lifting, bending, sitting, standing, squatting, sleeping, stairs, transfers, bed mobility, bathing, toileting, dressing, reach over head, hygiene/grooming, and locomotion level  PARTICIPATION  LIMITATIONS: meal prep, cleaning, laundry, shopping, community activity, and church  PERSONAL FACTORS: Age, Past/current experiences, Time since onset of injury/illness/exacerbation, and 3+ comorbidities: CAD, DM, HTN, hx of back surgery; sciatica, L TKA 04/21/23  are also affecting patient's functional outcome.   REHAB POTENTIAL: Good  CLINICAL DECISION MAKING: Evolving/moderate complexity  EVALUATION COMPLEXITY: Moderate  PLAN:  PT FREQUENCY: 2x/week  PT DURATION: 8 weeks  PLANNED INTERVENTIONS: 97164- PT Re-evaluation, 97110-Therapeutic exercises, 97530- Therapeutic activity, 97112- Neuromuscular re-education, 97535- Self Care, 16109- Manual therapy, [phone]- Gait training, [phone]- Orthotic Fit/training, Patient/Family education, Balance training, Stair training, Taping, Dry Needling, and DME instructions  PLAN FOR NEXT SESSION: work on maintaining stability when lifting; trial R AFO or foot up brace, R ankle strengthening, transfers, gait with walker?   Referring diagnosis?  REFERRING DIAG:  R26.81 (ICD-10-CM) - Unsteadiness on feet  M62.81 (ICD-10-CM) - Muscle weakness (generalized)  G20.B2 (ICD-10-CM) - Parkinson's disease with dyskinesia and fluctuating manifestations (HCC)  R29.3 (ICD-10-CM) - Abnormal posture   Treatment diagnosis? (if different than referring diagnosis)  Muscle weakness (generalized)  Unsteadiness on feet  Other abnormalities of gait and mobility  Other symptoms and signs involving the nervous system  What was this (referring dx) caused by? []  Surgery []  Fall []  Ongoing issue []  Arthritis [x]  Other: __insidious__________  Laterality: []  Rt []  Lt [x]  Both  Check all possible CPT codes:  *CHOOSE 10 OR LESS*    See Planned Interventions listed in the Plan section of the Evaluation.     Baldemar Friday, PT, DPT 10/23/23 12:42 PM  [facility] at [facility] [address] [place], [address] [place], [place]  [address] Phone # [phone] Fax # [phone]

## 2023-10-23 ENCOUNTER — Other Ambulatory Visit: Payer: Self-pay

## 2023-10-23 ENCOUNTER — Encounter: Payer: Self-pay | Admitting: Physical Therapy

## 2023-10-23 ENCOUNTER — Telehealth: Payer: Self-pay | Admitting: Physical Therapy

## 2023-10-23 ENCOUNTER — Ambulatory Visit: Payer: Medicare HMO | Attending: Neurology | Admitting: Physical Therapy

## 2023-10-23 DIAGNOSIS — M6281 Muscle weakness (generalized): Secondary | ICD-10-CM | POA: Diagnosis present

## 2023-10-23 DIAGNOSIS — R2681 Unsteadiness on feet: Secondary | ICD-10-CM | POA: Insufficient documentation

## 2023-10-23 DIAGNOSIS — R293 Abnormal posture: Secondary | ICD-10-CM | POA: Insufficient documentation

## 2023-10-23 DIAGNOSIS — M21371 Foot drop, right foot: Secondary | ICD-10-CM | POA: Insufficient documentation

## 2023-10-23 DIAGNOSIS — G20B2 Parkinson's disease with dyskinesia, with fluctuations: Secondary | ICD-10-CM | POA: Insufficient documentation

## 2023-10-23 DIAGNOSIS — R2689 Other abnormalities of gait and mobility: Secondary | ICD-10-CM | POA: Insufficient documentation

## 2023-10-23 DIAGNOSIS — R29818 Other symptoms and signs involving the nervous system: Secondary | ICD-10-CM | POA: Insufficient documentation

## 2023-10-23 NOTE — Telephone Encounter (Signed)
Dr. Frances Furbish,   Mr. Ronald [name] was evaluated by OPPT today for treatment of Parkinson's Disease.  The patient would benefit from OT evaluation for report of increased difficulty with dressing/ADLs.    If you agree, please place an order in [facility] in [facility] or fax the order to [phone].  Thank you!  Baldemar Friday, PT, DPT 10/23/23 12:46 PM  [facility] at [facility] [address] [place], [address] [place], [place] [address] Phone # [phone] Fax # [phone]

## 2023-10-26 ENCOUNTER — Telehealth: Payer: Self-pay

## 2023-10-26 ENCOUNTER — Ambulatory Visit: Payer: Medicare HMO

## 2023-10-26 DIAGNOSIS — M21371 Foot drop, right foot: Secondary | ICD-10-CM

## 2023-10-26 DIAGNOSIS — R2689 Other abnormalities of gait and mobility: Secondary | ICD-10-CM

## 2023-10-26 DIAGNOSIS — M6281 Muscle weakness (generalized): Secondary | ICD-10-CM | POA: Diagnosis not present

## 2023-10-26 DIAGNOSIS — R29818 Other symptoms and signs involving the nervous system: Secondary | ICD-10-CM

## 2023-10-26 DIAGNOSIS — G20B2 Parkinson's disease with dyskinesia, with fluctuations: Secondary | ICD-10-CM

## 2023-10-26 DIAGNOSIS — R2681 Unsteadiness on feet: Secondary | ICD-10-CM

## 2023-10-26 DIAGNOSIS — R293 Abnormal posture: Secondary | ICD-10-CM

## 2023-10-26 NOTE — Therapy (Signed)
 OUTPATIENT PHYSICAL THERAPY NEURO TREATMENT   Patient Name: Ronald [name] MRN: [id number] DOB:12/06/1950, 73 y.o., male Today's Date: 10/26/2023   PCP: Naomi Hitch, MD  REFERRING PROVIDER: Buck Saucer, MD   END OF SESSION:  PT End of Session - 10/26/23 1152     Visit Number 2    Number of Visits 17    Date for PT Re-Evaluation 12/18/23    Authorization Type [employer]    Authorization Time Period --   auth submitted   Authorization - Visit Number 1    PT Start Time 1145    PT Stop Time 1230    PT Time Calculation (min) 45 min    Equipment Utilized During Treatment Gait belt    Activity Tolerance Patient tolerated treatment well    Behavior During Therapy WFL for tasks assessed/performed             Past Medical History:  Diagnosis Date   Anemia    Arthritis    Bell's palsy    CAD in native artery 06/04/2020   RCA infarct 2013.  S/p RCA and OM PCI   Diabetes mellitus type 2 in nonobese (HCC) 06/04/2020   Diabetes mellitus without complication (HCC)    Essential hypertension 06/04/2020   Floaters in visual field    Hypertension    Myocardial infarction [facility])    Parkinson's disease (HCC)    Parkinson's disease (HCC) 06/04/2020   Pure hypercholesterolemia 06/04/2020   Past Surgical History:  Procedure Laterality Date   APPENDECTOMY     back epidural   08/2017   BACK SURGERY     CORONARY ANGIOPLASTY WITH STENT PLACEMENT  08/19/2012   KNEE SURGERY     TOTAL KNEE ARTHROPLASTY Left 04/21/2023   Procedure: LEFT TOTAL KNEE ARTHROPLASTY;  Surgeon: Vernetta Lonni GRADE, MD;  Location: [facility];  Service: Orthopedics;  Laterality: Left;   Patient Active Problem List   Diagnosis Date Noted   Status post total left knee replacement 04/21/2023   High risk medication use 10/28/2021   Olecranon bursitis, left elbow 04/21/2021   Personal history of COVID-19 04/13/2021   Primary osteoarthritis, 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   Essential hypertension 06/04/2020    Pure hypercholesterolemia 06/04/2020   CAD in native artery 06/04/2020   Parkinson's disease (HCC) 06/04/2020   Diabetes mellitus type 2 in nonobese (HCC) 06/04/2020   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   Iron deficiency anemia 11/14/2019   Unilateral primary osteoarthritis, left knee 04/01/2019   Chronic left-sided low back pain with left-sided sciatica 04/01/2019   Lumbar radiculopathy 08/21/2017   Chronic pain syndrome 08/04/2017   Facet arthropathy 08/04/2017   Lumbar paraspinal muscle spasm 08/04/2017   Anemia of chronic disease 07/03/2017   Dizziness 11/10/2016   Hyperlipidemia, unspecified 10/27/2016   Other intervertebral disc degeneration, lumbar region 06/30/2016   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 heart with stable angina pectoris (HCC) 02/26/2016   SOB (shortness of breath) 02/26/2016   Old MI (myocardial infarction) 02/26/2016   OAB (overactive bladder) 09/05/2013   Ischemic heart disease 09/21/2012   Bell's palsy 06/14/2012    ONSET DATE: several years  REFERRING DIAG:  R26.81 (ICD-10-CM) - Unsteadiness on feet  M62.81 (ICD-10-CM) - Muscle weakness (generalized)  G20.B2 (ICD-10-CM) - Parkinson's disease with dyskinesia and fluctuating manifestations (HCC)  R29.3 (ICD-10-CM) - Abnormal posture    THERAPY DIAG:  Muscle weakness (generalized)  Unsteadiness on feet  Other abnormalities of gait and mobility  Other symptoms and signs involving the  nervous system  Parkinson's disease with dyskinesia and fluctuating manifestations (HCC)  Abnormal posture  Rationale for Evaluation and Treatment: Rehabilitation  SUBJECTIVE:                                                                                                                                                                                             SUBJECTIVE STATEMENT: Patient reports that he has had at least a dozen falls in the past 6 months, occurring before and after his TKA  in August. Tendency to fall backwards and reports that he has been able to get up from the ground independently. Reports that the L knee is doing good; not having residual pain or buckling. Reports that his back is still a problem and has bulging discs. Reports N/T down R>L LEs but no radiation or B&B changes. reports that it is getting harder for him to put his pants on. Using [facility] when outside; not using AD inside the home. Reports occasional freezing episodes.   Pt accompanied by: self  PERTINENT HISTORY: CAD, DM, HTN, hx of back surgery; sciatica, L TKA 04/21/23  PAIN:  Are you having pain? Yes: NPRS scale: 5/10 Pain location: R LB Pain description: aching, shooting Aggravating factors: prolonged standing, lifting >25 lbs Relieving factors: taking some weight off  PRECAUTIONS: Fall  RED FLAGS: None   WEIGHT BEARING RESTRICTIONS: No  FALLS: Has patient fallen in last 6 months? Yes. Number of falls reports at least a dozen  LIVING ENVIRONMENT: Lives with: lives with their spouse Lives in: House/apartment Stairs:  2nd story with 6 steps upstairs and downstairs; 1 step to enter  Has following equipment at home: Single point cane, Walker - 2 wheeled, and upright walker, shower seat  PLOF: Independent  PATIENT GOALS: strengthening my legs and arms so I can lift things without falling   OBJECTIVE:   TODAY'S TREATMENT: 10/26/23 Activity Comments  Gait training Use of traditional AFO Use of ground-reaction AFO Using RW  Stride stance sit to stand 3x5   Static balance Feet together EO/EC Head turns EO/EC 3x  Gastroc stretch 1x60 sec   NU-step level 5 x 5 min Speed intervals able to sustain 147 SPM x 30 sec! Only able to tolerate 2 rounds        PATIENT EDUCATION: Education details: prognosis, POC, edu on benefits of OT- pt reports he is interested; advised pt to f/u with spine MD d/t progression of R ankle weakness compared to PT eval in June 2024. Edu on risk of falls and  potential of using AFO. Advised to use RW outside and indoors or at the very  least a SPC indoors d/t fall frequency- pt agreeable Person educated: Patient Education method: Explanation, Demonstration, Tactile cues, Verbal cues, and Handouts Education comprehension: verbalized understanding and returned demonstration  HOME EXERCISE PROGRAM: Access Code: [email or web address] URL: [email or web address] Date: 10/26/2023 Prepared by: Burnard Sandifer  Exercises - Sit to Stand in Stride with AFO and UE Assist in LE Alignment  - 1 x daily - 7 x weekly - 3-5 sets - 5 reps - Corner Balance Feet Together With Eyes Open  - 1 x daily - 7 x weekly - 1-3 sets - 30 sec hold - Corner Balance Feet Together With Eyes Closed  - 1 x daily - 7 x weekly - 1-3 sets - 30 sec hold - Corner Balance Feet Together: Eyes Open With Head Turns  - 1 x daily - 7 x weekly - 3 sets - 3 reps - Corner Balance Feet Together: Eyes Closed With Head Turns  - 1 x daily - 7 x weekly - 3 sets - 3 reps - Standing Gastroc Stretch  - 1 x daily - 7 x weekly - 3 sets - 60 sec hold  Note: Objective measures were completed at Evaluation unless otherwise noted.  DIAGNOSTIC FINDINGS: none recent  COGNITION: Overall cognitive status: Within functional limits for tasks assessed   SENSATION: Pt reports diminished sensation in B LEs but testing intact to light touch  COORDINATION: Alternating pronation/supination: very slightly slowed  Alternating toe tap: reduced ROM on R  Finger to nose: slight intention tremor B  MUSCLE TONE: slightly increased tone in R quad   POSTURE: L lean sitting in chair, flexed posture   LOWER EXTREMITY ROM:     Active  Right Eval Left Eval  Hip flexion    Hip extension    Hip abduction    Hip adduction    Hip internal rotation    Hip external rotation    Knee flexion  106  Knee extension  2  Ankle dorsiflexion -9 1  Ankle plantarflexion    Ankle inversion    Ankle eversion     (Blank rows =  not tested)  LOWER EXTREMITY MMT:    MMT (in sitting) Right Eval Left Eval  Hip flexion 4+ 5  Hip extension    Hip abduction 4 4+  Hip adduction 4 4+  Hip internal rotation    Hip external rotation    Knee flexion 4+ 4 *c/o muscle spasm in L toes  Knee extension 4+ 4+  Ankle dorsiflexion 2 4-  Ankle plantarflexion 2+ 4  Ankle inversion    Ankle eversion    (Blank rows = not tested)     GAIT: Gait pattern: audible R foot slap, R LE slightly scissoring, hip instability and imbalance  Assistive device utilized: Single point cane Level of assistance: CGA   FUNCTIONAL TESTS:  70M walk: 18.03 sec (1.82 ft/sec) 5xSTS: 23.84 sec some retropulsion on 1st 2 reps  TREATMENT DATE: 10/23/23      GOALS: Goals reviewed with patient? Yes  SHORT TERM GOALS: Target date: 11/20/2023  Patient to be independent with initial HEP. Baseline: HEP initiated Goal status: INITIAL    LONG TERM GOALS: Target date: 12/18/2023  Patient to be independent with advanced HEP. Baseline: Not yet initiated  Goal status: INITIAL  Patient to score at least 46/56 on Berg in order to decrease risk of falls.   Baseline: NT Goal status: INITIAL  Patient to demonstrate safe ambulation with mod I with LRAD on indoor and outdoor surfaces.  Baseline: currently unsteady with SPC indoors and requires CGA Goal status: INITIAL  Patient to demonstrate 5xSTS test in <15 sec in order to decrease risk of falls.   Baseline: 5xSTS: 23.84 sec some retropulsion on 1st 2 reps  Goal status: INITIAL  Patient to verbalize understanding of fall prevention in home environment information. Baseline: Not yet initiated Goal status: INITIAL  Patient to verbalize tips to reduce freezing/festination with gait and turns. Baseline: Not yet initiated  Goal status:  INITIAL   ASSESSMENT:  CLINICAL IMPRESSION: Demonstrates right foot drop/slap in initial contact. Trials with conventional AFO with modest improvement. Use of ground-reaction AFO on right ankle provided best support for foot clearance in swing and improved loading response mechanics and stability. Expresses interest in pursuing brace intervention.  Instructed in HEP activities to improve sit to stand transfers to improve strength and reduce retropulsion with good effect using stride stance and 20 seat height. Static standing balance to improve postural awareness with difficulty under eyes closed and head movement conditions requiring light UE support. Trial of HIIT intervals for general conditioning and benefit of rapid alternating movement. Continued sessions to progress POC details.    OBJECTIVE IMPAIRMENTS: Abnormal gait, decreased balance, decreased coordination, difficulty walking, decreased ROM, decreased strength, decreased safety awareness, impaired flexibility, impaired tone, improper body mechanics, postural dysfunction, and pain.   ACTIVITY LIMITATIONS: carrying, lifting, bending, sitting, standing, squatting, sleeping, stairs, transfers, bed mobility, bathing, toileting, dressing, reach over head, hygiene/grooming, and locomotion level  PARTICIPATION LIMITATIONS: meal prep, cleaning, laundry, shopping, community activity, and church  PERSONAL FACTORS: Age, Past/current experiences, Time since onset of injury/illness/exacerbation, and 3+ comorbidities: CAD, DM, HTN, hx of back surgery; sciatica, L TKA 04/21/23  are also affecting patient's functional outcome.   REHAB POTENTIAL: Good  CLINICAL DECISION MAKING: Evolving/moderate complexity  EVALUATION COMPLEXITY: Moderate  PLAN:  PT FREQUENCY: 2x/week  PT DURATION: 8 weeks  PLANNED INTERVENTIONS: 97164- PT Re-evaluation, 97110-Therapeutic exercises, 97530- Therapeutic activity, 97112- Neuromuscular re-education, 97535- Self Care,  97140- Manual therapy, [phone]- Gait training, [id number]- Orthotic Fit/training, Patient/Family education, Balance training, Stair training, Taping, Dry Needling, and DME instructions  PLAN FOR NEXT SESSION: work on maintaining stability when lifting; trial R AFO or foot up brace, R ankle strengthening, transfers, gait with walker?   12:46 PM, 10/26/23 M. Kelly Merlen Gurry, PT, DPT Physical Therapist- [place] Office Number: [phone]

## 2023-10-26 NOTE — Telephone Encounter (Signed)
 Hi Dr. Merrie,  Ronald [name] is being treated by physical therapy for weakness and deficits related to Parkinson's Disease.  He has right foot drop causing issues with transfers and gait.  Ronald [name] will benefit from use of Right ground-reaction AFO in order to improve safety with functional mobility.    Due to insurance regulations, patients must have a face-to-face visit with a physician within the last 6 months wherein the benefit of bracing has been discussed and documented in the patient file. You may want to have the patient schedule a new appointment with you, or if you feel comfortable you may addend your previous note with the patient, stating that orthotics were discussed.   If you agree, please submit request in EPIC under MD Order, Other Orders (list Right Ground-Reaction AFO in comments) and please include the ICD-10 code, MD signature, and NPI. You may also fax to [facility] at [phone].   Thank you, 12:50 PM, 10/26/23 M. Kelly Harvey Matlack, PT, DPT Physical Therapist- [place] Office Number: [phone]    [facility] [address] [place], [place]  [address] Phone:  [phone] Fax:  [phone]

## 2023-10-30 ENCOUNTER — Ambulatory Visit: Payer: Medicare HMO

## 2023-10-30 DIAGNOSIS — R2681 Unsteadiness on feet: Secondary | ICD-10-CM

## 2023-10-30 DIAGNOSIS — R29818 Other symptoms and signs involving the nervous system: Secondary | ICD-10-CM

## 2023-10-30 DIAGNOSIS — G20B2 Parkinson's disease with dyskinesia, with fluctuations: Secondary | ICD-10-CM

## 2023-10-30 DIAGNOSIS — M6281 Muscle weakness (generalized): Secondary | ICD-10-CM

## 2023-10-30 DIAGNOSIS — R293 Abnormal posture: Secondary | ICD-10-CM

## 2023-10-30 DIAGNOSIS — R2689 Other abnormalities of gait and mobility: Secondary | ICD-10-CM

## 2023-10-30 NOTE — Therapy (Signed)
 OUTPATIENT PHYSICAL THERAPY NEURO TREATMENT   Patient Name: Ronald [name] MRN: [id number] DOB:1950/11/15, 73 y.o., male Today's Date: 10/30/2023   PCP: Loree Roe, MD  REFERRING PROVIDER: Debbra Fairy, MD   END OF SESSION:  PT End of Session - 10/30/23 1146     Visit Number 3    Number of Visits 17    Date for PT Re-Evaluation 12/18/23    Authorization Type [employer]    Authorization Time Period --   auth submitted   Authorization - Visit Number 3    Progress Note Due on Visit 10    PT Start Time 1145    PT Stop Time 1230    PT Time Calculation (min) 45 min    Equipment Utilized During Treatment Gait belt    Activity Tolerance Patient tolerated treatment well    Behavior During Therapy WFL for tasks assessed/performed             Past Medical History:  Diagnosis Date   Anemia    Arthritis    Bell's palsy    CAD in native artery 06/04/2020   RCA infarct 2013.  S/p RCA and OM PCI   Diabetes mellitus type 2 in nonobese (HCC) 06/04/2020   Diabetes mellitus without complication (HCC)    Essential hypertension 06/04/2020   Floaters in visual field    Hypertension    Myocardial infarction [facility]    Parkinson's disease (HCC)    Parkinson's disease (HCC) 06/04/2020   Pure hypercholesterolemia 06/04/2020   Past Surgical History:  Procedure Laterality Date   APPENDECTOMY     back epidural   08/2017   BACK SURGERY     CORONARY ANGIOPLASTY WITH STENT PLACEMENT  08/19/2012   KNEE SURGERY     TOTAL KNEE ARTHROPLASTY Left 04/21/2023   Procedure: LEFT TOTAL KNEE ARTHROPLASTY;  Surgeon: Arnie Lao, MD;  Location: [facility];  Service: Orthopedics;  Laterality: Left;   Patient Active Problem List   Diagnosis Date Noted   Status post total left knee replacement 04/21/2023   High risk medication use 10/28/2021   Olecranon bursitis, left elbow 04/21/2021   Personal history of COVID-19 04/13/2021   Primary osteoarthritis, 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    Essential hypertension 06/04/2020   Pure hypercholesterolemia 06/04/2020   CAD in native artery 06/04/2020   Parkinson's disease (HCC) 06/04/2020   Diabetes mellitus type 2 in nonobese (HCC) 06/04/2020   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   Iron deficiency anemia 11/14/2019   Unilateral primary osteoarthritis, left knee 04/01/2019   Chronic left-sided low back pain with left-sided sciatica 04/01/2019   Lumbar radiculopathy 08/21/2017   Chronic pain syndrome 08/04/2017   Facet arthropathy 08/04/2017   Lumbar paraspinal muscle spasm 08/04/2017   Anemia of chronic disease 07/03/2017   Dizziness 11/10/2016   Hyperlipidemia, unspecified 10/27/2016   Other intervertebral disc degeneration, lumbar region 06/30/2016   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 heart with stable angina pectoris (HCC) 02/26/2016   SOB (shortness of breath) 02/26/2016   Old MI (myocardial infarction) 02/26/2016   OAB (overactive bladder) 09/05/2013   Ischemic heart disease 09/21/2012   Bell's palsy 06/14/2012    ONSET DATE: several years  REFERRING DIAG:  R26.81 (ICD-10-CM) - Unsteadiness on feet  M62.81 (ICD-10-CM) - Muscle weakness (generalized)  G20.B2 (ICD-10-CM) - Parkinson's disease with dyskinesia and fluctuating manifestations (HCC)  R29.3 (ICD-10-CM) - Abnormal posture    THERAPY DIAG:  Muscle weakness (generalized)  Unsteadiness on feet  Other abnormalities of gait  and mobility  Other symptoms and signs involving the nervous system  Parkinson's disease with dyskinesia and fluctuating manifestations (HCC)  Abnormal posture  Rationale for Evaluation and Treatment: Rehabilitation  SUBJECTIVE:                                                                                                                                                                                             SUBJECTIVE STATEMENT: Doing ok, no new issues   Pt accompanied by: self  PERTINENT HISTORY:  CAD, DM, HTN, hx of back surgery; sciatica, L TKA 04/21/23  PAIN:  Are you having pain? Yes: NPRS scale: 5/10 Pain location: R LB Pain description: aching, shooting Aggravating factors: prolonged standing, lifting >25 lbs Relieving factors: taking some weight off  PRECAUTIONS: Fall  RED FLAGS: None   WEIGHT BEARING RESTRICTIONS: No  FALLS: Has patient fallen in last 6 months? Yes. Number of falls reports at least a dozen  LIVING ENVIRONMENT: Lives with: lives with their spouse Lives in: House/apartment Stairs:  2nd story with 6 steps upstairs and downstairs; 1 step to enter  Has following equipment at home: Single point cane, Walker - 2 wheeled, and upright walker, shower seat  PLOF: Independent  PATIENT GOALS: strengthening my legs and arms so I can lift things without falling   OBJECTIVE:   TODAY'S TREATMENT: 10/30/23 Activity Comments  LE PRE -LAQ 3x10 5# -seated march 3x10 5# -hip add iso 3x10 -hip abd/ER 3x10 blue -hamstring curls 3x10 blue  Gait training -ground reaction AFO right ankle--discomfort from stay contacting medial ankle -PLS AFO improved comfort walk speed 2.4 ft/sec.                  TODAY'S TREATMENT: 10/26/23 Activity Comments  Gait training Use of traditional AFO Use of ground-reaction AFO Using RW  Stride stance sit to stand 3x5   Static balance Feet together EO/EC Head turns EO/EC 3x  Gastroc stretch 1x60 sec   NU-step level 5 x 5 min Speed intervals able to sustain 147 SPM x 30 sec! Only able to tolerate 2 rounds        PATIENT EDUCATION: Education details: prognosis, POC, edu on benefits of OT- pt reports he is interested; advised pt to f/u with spine MD d/t progression of R ankle weakness compared to PT eval in June 2024. Edu on risk of falls and potential of using AFO. Advised to use RW outside and indoors or at the very least a SPC indoors d/t fall frequency- pt agreeable Person educated: Patient Education method: Explanation,  Demonstration, Tactile cues, Verbal cues, and Handouts Education comprehension: verbalized understanding and  returned demonstration  HOME EXERCISE PROGRAM: Access Code: [email or web address] URL: [email or web address] Date: 10/26/2023 Prepared by: Tedd Favorite  Exercises - Sit to Stand in Stride with AFO and UE Assist in LE Alignment  - 1 x daily - 7 x weekly - 3-5 sets - 5 reps - Corner Balance Feet Together With Eyes Open  - 1 x daily - 7 x weekly - 1-3 sets - 30 sec hold - Corner Balance Feet Together With Eyes Closed  - 1 x daily - 7 x weekly - 1-3 sets - 30 sec hold - Corner Balance Feet Together: Eyes Open With Head Turns  - 1 x daily - 7 x weekly - 3 sets - 3 reps - Corner Balance Feet Together: Eyes Closed With Head Turns  - 1 x daily - 7 x weekly - 3 sets - 3 reps - Standing Gastroc Stretch  - 1 x daily - 7 x weekly - 3 sets - 60 sec hold  Note: Objective measures were completed at Evaluation unless otherwise noted.  DIAGNOSTIC FINDINGS: none recent  COGNITION: Overall cognitive status: Within functional limits for tasks assessed   SENSATION: Pt reports diminished sensation in B LEs but testing intact to light touch  COORDINATION: Alternating pronation/supination: very slightly slowed  Alternating toe tap: reduced ROM on R  Finger to nose: slight intention tremor B  MUSCLE TONE: slightly increased tone in R quad   POSTURE: L lean sitting in chair, flexed posture   LOWER EXTREMITY ROM:     Active  Right Eval Left Eval  Hip flexion    Hip extension    Hip abduction    Hip adduction    Hip internal rotation    Hip external rotation    Knee flexion  106  Knee extension  2  Ankle dorsiflexion -9 1  Ankle plantarflexion    Ankle inversion    Ankle eversion     (Blank rows = not tested)  LOWER EXTREMITY MMT:    MMT (in sitting) Right Eval Left Eval  Hip flexion 4+ 5  Hip extension    Hip abduction 4 4+  Hip adduction 4 4+  Hip internal rotation     Hip external rotation    Knee flexion 4+ 4 *c/o muscle spasm in L toes  Knee extension 4+ 4+  Ankle dorsiflexion 2 4-  Ankle plantarflexion 2+ 4  Ankle inversion    Ankle eversion    (Blank rows = not tested)     GAIT: Gait pattern: audible R foot slap, R LE slightly scissoring, hip instability and imbalance  Assistive device utilized: Single point cane Level of assistance: CGA   FUNCTIONAL TESTS:  50M walk: 18.03 sec (1.82 ft/sec) 5xSTS: 23.84 sec some retropulsion on 1st 2 reps  TREATMENT DATE: 10/23/23      GOALS: Goals reviewed with patient? Yes  SHORT TERM GOALS: Target date: 11/20/2023  Patient to be independent with initial HEP. Baseline: HEP initiated Goal status: INITIAL    LONG TERM GOALS: Target date: 12/18/2023  Patient to be independent with advanced HEP. Baseline: Not yet initiated  Goal status: INITIAL  Patient to score at least 46/56 on Berg in order to decrease risk of falls.   Baseline: NT Goal status: INITIAL  Patient to demonstrate safe ambulation with mod I with LRAD on indoor and outdoor surfaces.  Baseline: currently unsteady with SPC indoors and requires CGA Goal status: INITIAL  Patient to demonstrate 5xSTS test in <15 sec in order to decrease risk of falls.   Baseline: 5xSTS: 23.84 sec some retropulsion on 1st 2 reps  Goal status: INITIAL  Patient to verbalize understanding of fall prevention in home environment information. Baseline: Not yet initiated Goal status: INITIAL  Patient to verbalize tips to reduce freezing/festination with gait and turns. Baseline: Not yet initiated  Goal status: INITIAL   ASSESSMENT:  CLINICAL IMPRESSION: Session initiated with seated open chain strength exercises against heavy resistance to improve/facilitate motor control and activity tolerance.  RLE demo weakness > LLE  and exhibits significant atrophy to right tibialis anterior with resulting foot drop in gait.  Trials with ground-reaction AFO on right w/ improved foot clearance and stability in loading response, but stay contacts posterior-medial ankle causing discomfort and slower speed. Trials with PLS AFO right w/ improved comfort and gait speed.  Discussed merits and limitations of each device. Pt reports he will f/u w/ MD regarding presentation of foot drop and parasthesias affecting RLE. Continued sessions to progress POC details to improve mobility and reduce risk for falls.    OBJECTIVE IMPAIRMENTS: Abnormal gait, decreased balance, decreased coordination, difficulty walking, decreased ROM, decreased strength, decreased safety awareness, impaired flexibility, impaired tone, improper body mechanics, postural dysfunction, and pain.   ACTIVITY LIMITATIONS: carrying, lifting, bending, sitting, standing, squatting, sleeping, stairs, transfers, bed mobility, bathing, toileting, dressing, reach over head, hygiene/grooming, and locomotion level  PARTICIPATION LIMITATIONS: meal prep, cleaning, laundry, shopping, community activity, and church  PERSONAL FACTORS: Age, Past/current experiences, Time since onset of injury/illness/exacerbation, and 3+ comorbidities: CAD, DM, HTN, hx of back surgery; sciatica, L TKA 04/21/23  are also affecting patient's functional outcome.   REHAB POTENTIAL: Good  CLINICAL DECISION MAKING: Evolving/moderate complexity  EVALUATION COMPLEXITY: Moderate  PLAN:  PT FREQUENCY: 2x/week  PT DURATION: 8 weeks  PLANNED INTERVENTIONS: 97164- PT Re-evaluation, 97110-Therapeutic exercises, 97530- Therapeutic activity, 97112- Neuromuscular re-education, 97535- Self Care, 95284- Manual therapy, [phone]- Gait training, [phone]- Orthotic Fit/training, Patient/Family education, Balance training, Stair training, Taping, Dry Needling, and DME instructions  PLAN FOR NEXT SESSION: work on maintaining  stability when lifting; balance training   11:47 AM, 10/30/23 M. Kelly Jailan Trimm, PT, DPT Physical Therapist- [place] Office Number: [phone]

## 2023-11-02 ENCOUNTER — Ambulatory Visit: Payer: Medicare HMO

## 2023-11-02 ENCOUNTER — Telehealth: Payer: Self-pay | Admitting: Neurology

## 2023-11-02 DIAGNOSIS — M6281 Muscle weakness (generalized): Secondary | ICD-10-CM

## 2023-11-02 DIAGNOSIS — R2681 Unsteadiness on feet: Secondary | ICD-10-CM

## 2023-11-02 DIAGNOSIS — R29818 Other symptoms and signs involving the nervous system: Secondary | ICD-10-CM

## 2023-11-02 DIAGNOSIS — R2689 Other abnormalities of gait and mobility: Secondary | ICD-10-CM

## 2023-11-02 DIAGNOSIS — G20B2 Parkinson's disease with dyskinesia, with fluctuations: Secondary | ICD-10-CM

## 2023-11-02 NOTE — Addendum Note (Signed)
Addended by: Bertram Savin on: 11/02/2023 04:51 PM   Modules accepted: Orders

## 2023-11-02 NOTE — Therapy (Signed)
OUTPATIENT PHYSICAL THERAPY NEURO TREATMENT   Patient Name: Ronald [name] MRN: [id number] DOB:1951-07-13, 73 y.o., male Today's Date: 11/02/2023   PCP: Woodroe Chen, MD  REFERRING PROVIDER: Huston Foley, MD   END OF SESSION:  PT End of Session - 11/02/23 1152     Visit Number 4    Number of Visits 17    Date for PT Re-Evaluation 12/18/23    Authorization Type [employer]    Authorization Time Period --   auth submitted   Progress Note Due on Visit 10    PT Start Time 1151    PT Stop Time 1231    PT Time Calculation (min) 40 min    Equipment Utilized During Treatment Gait belt    Activity Tolerance Patient tolerated treatment well    Behavior During Therapy WFL for tasks assessed/performed             Past Medical History:  Diagnosis Date   Anemia    Arthritis    Bell's palsy    CAD in native artery 06/04/2020   RCA infarct 2013.  S/p RCA and OM PCI   Diabetes mellitus type 2 in nonobese (HCC) 06/04/2020   Diabetes mellitus without complication (HCC)    Essential hypertension 06/04/2020   Floaters in visual field    Hypertension    Myocardial infarction [facility])    Parkinson's disease (HCC)    Parkinson's disease (HCC) 06/04/2020   Pure hypercholesterolemia 06/04/2020   Past Surgical History:  Procedure Laterality Date   APPENDECTOMY     back epidural   08/2017   BACK SURGERY     CORONARY ANGIOPLASTY WITH STENT PLACEMENT  08/19/2012   KNEE SURGERY     TOTAL KNEE ARTHROPLASTY Left 04/21/2023   Procedure: LEFT TOTAL KNEE ARTHROPLASTY;  Surgeon: Kathryne Hitch, MD;  Location: [facility];  Service: Orthopedics;  Laterality: Left;   Patient Active Problem List   Diagnosis Date Noted   Status post total left knee replacement 04/21/2023   High risk medication use 10/28/2021   Olecranon bursitis, left elbow 04/21/2021   Personal history of COVID-19 04/13/2021   Primary osteoarthritis, 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   Essential hypertension 06/04/2020    Pure hypercholesterolemia 06/04/2020   CAD in native artery 06/04/2020   Parkinson's disease (HCC) 06/04/2020   Diabetes mellitus type 2 in nonobese (HCC) 06/04/2020   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   Iron deficiency anemia 11/14/2019   Unilateral primary osteoarthritis, left knee 04/01/2019   Chronic left-sided low back pain with left-sided sciatica 04/01/2019   Lumbar radiculopathy 08/21/2017   Chronic pain syndrome 08/04/2017   Facet arthropathy 08/04/2017   Lumbar paraspinal muscle spasm 08/04/2017   Anemia of chronic disease 07/03/2017   Dizziness 11/10/2016   Hyperlipidemia, unspecified 10/27/2016   Other intervertebral disc degeneration, lumbar region 06/30/2016   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 heart with stable angina pectoris (HCC) 02/26/2016   SOB (shortness of breath) 02/26/2016   Old MI (myocardial infarction) 02/26/2016   OAB (overactive bladder) 09/05/2013   Ischemic heart disease 09/21/2012   Bell's palsy 06/14/2012    ONSET DATE: several years  REFERRING DIAG:  R26.81 (ICD-10-CM) - Unsteadiness on feet  M62.81 (ICD-10-CM) - Muscle weakness (generalized)  G20.B2 (ICD-10-CM) - Parkinson's disease with dyskinesia and fluctuating manifestations (HCC)  R29.3 (ICD-10-CM) - Abnormal posture    THERAPY DIAG:  Muscle weakness (generalized)  Unsteadiness on feet  Other abnormalities of gait and mobility  Other symptoms and signs involving  the nervous system  Parkinson's disease with dyskinesia and fluctuating manifestations (HCC)  Rationale for Evaluation and Treatment: Rehabilitation  SUBJECTIVE:                                                                                                                                                                                             SUBJECTIVE STATEMENT:    Pt accompanied by: self  PERTINENT HISTORY: CAD, DM, HTN, hx of back surgery; sciatica, L TKA 04/21/23  PAIN:  Are you  having pain? Yes: NPRS scale: 5/10 Pain location: R LB Pain description: aching, shooting Aggravating factors: prolonged standing, lifting >25 lbs Relieving factors: taking some weight off  PRECAUTIONS: Fall  RED FLAGS: None   WEIGHT BEARING RESTRICTIONS: No  FALLS: Has patient fallen in last 6 months? Yes. Number of falls reports at least a dozen  LIVING ENVIRONMENT: Lives with: lives with their spouse Lives in: House/apartment Stairs:  2nd story with 6 steps upstairs and downstairs; 1 step to enter  Has following equipment at home: Single point cane, Walker - 2 wheeled, and upright walker, shower seat  PLOF: Independent  PATIENT GOALS: strengthening my legs and arms so I can lift things without falling   OBJECTIVE:   TODAY'S TREATMENT: 11/02/23 Activity Comments  Lifting/body mechanics Modified deadlift form 15#, adapted conditions  Seated PWR moves Slow rehearsal 1x10 w/ flow                     PATIENT EDUCATION: Education details: prognosis, POC, edu on benefits of OT- pt reports he is interested; advised pt to f/u with spine MD d/t progression of R ankle weakness compared to PT eval in June 2024. Edu on risk of falls and potential of using AFO. Advised to use RW outside and indoors or at the very least a SPC indoors d/t fall frequency- pt agreeable Person educated: Patient Education method: Explanation, Demonstration, Tactile cues, Verbal cues, and Handouts Education comprehension: verbalized understanding and returned demonstration  HOME EXERCISE PROGRAM: Access Code: [email or web address] URL: [email or web address] Date: 10/26/2023 Prepared by: Shary Decamp  Exercises - Sit to Stand in Stride with AFO and UE Assist in LE Alignment  - 1 x daily - 7 x weekly - 3-5 sets - 5 reps - Corner Balance Feet Together With Eyes Open  - 1 x daily - 7 x weekly - 1-3 sets - 30 sec hold - Corner Balance Feet Together With Eyes Closed  - 1 x daily - 7 x weekly - 1-3  sets - 30 sec hold - Corner Balance Feet Together: Eyes Open With Head Turns  - 1  x daily - 7 x weekly - 3 sets - 3 reps - Corner Balance Feet Together: Eyes Closed With Head Turns  - 1 x daily - 7 x weekly - 3 sets - 3 reps - Standing Gastroc Stretch  - 1 x daily - 7 x weekly - 3 sets - 60 sec hold  Note: Objective measures were completed at Evaluation unless otherwise noted.  DIAGNOSTIC FINDINGS: none recent  COGNITION: Overall cognitive status: Within functional limits for tasks assessed   SENSATION: Pt reports diminished sensation in B LEs but testing intact to light touch  COORDINATION: Alternating pronation/supination: very slightly slowed  Alternating toe tap: reduced ROM on R  Finger to nose: slight intention tremor B  MUSCLE TONE: slightly increased tone in R quad   POSTURE: L lean sitting in chair, flexed posture   LOWER EXTREMITY ROM:     Active  Right Eval Left Eval  Hip flexion    Hip extension    Hip abduction    Hip adduction    Hip internal rotation    Hip external rotation    Knee flexion  106  Knee extension  2  Ankle dorsiflexion -9 1  Ankle plantarflexion    Ankle inversion    Ankle eversion     (Blank rows = not tested)  LOWER EXTREMITY MMT:    MMT (in sitting) Right Eval Left Eval  Hip flexion 4+ 5  Hip extension    Hip abduction 4 4+  Hip adduction 4 4+  Hip internal rotation    Hip external rotation    Knee flexion 4+ 4 *c/o muscle spasm in L toes  Knee extension 4+ 4+  Ankle dorsiflexion 2 4-  Ankle plantarflexion 2+ 4  Ankle inversion    Ankle eversion    (Blank rows = not tested)     GAIT: Gait pattern: audible R foot slap, R LE slightly scissoring, hip instability and imbalance  Assistive device utilized: Single point cane Level of assistance: CGA   FUNCTIONAL TESTS:  39M walk: 18.03 sec (1.82 ft/sec) 5xSTS: 23.84 sec some retropulsion on 1st 2 reps                                                                                                                                TREATMENT DATE: 10/23/23      GOALS: Goals reviewed with patient? Yes  SHORT TERM GOALS: Target date: 11/20/2023  Patient to be independent with initial HEP. Baseline: HEP initiated Goal status: INITIAL    LONG TERM GOALS: Target date: 12/18/2023  Patient to be independent with advanced HEP. Baseline: Not yet initiated  Goal status: INITIAL  Patient to score at least 46/56 on Berg in order to decrease risk of falls.   Baseline: NT Goal status: INITIAL  Patient to demonstrate safe ambulation with mod I with LRAD on indoor and outdoor surfaces.  Baseline: currently unsteady with SPC indoors and requires CGA  Goal status: INITIAL  Patient to demonstrate 5xSTS test in <15 sec in order to decrease risk of falls.   Baseline: 5xSTS: 23.84 sec some retropulsion on 1st 2 reps  Goal status: INITIAL  Patient to verbalize understanding of fall prevention in home environment information. Baseline: Not yet initiated Goal status: INITIAL  Patient to verbalize tips to reduce freezing/festination with gait and turns. Baseline: Not yet initiated  Goal status: INITIAL   ASSESSMENT:  CLINICAL IMPRESSION: Pt reports difficulty with picking up items from ground with frequent anterior/posterior LOB.  Instructed in deadlift/squat lift mechanics to improve body mechanics and stability for lifting heavy items from ground. Weight placed on 4" height to reduce level of travel and cues for hip hinge and use of chair behind to reinforce with good form exhibited by tendency for retroLOB with limited tibial control observed RLE, marked improvement with use of Right AFO which helped control tibial advancement and eliminated instances of LOB.  Instructed in seated large amplitude movements to improve motor control/coordination with excellent return demonstration.  Pt would benefit from Right AFO to improve safety with transfers and gait/mobility.      OBJECTIVE IMPAIRMENTS: Abnormal gait, decreased balance, decreased coordination, difficulty walking, decreased ROM, decreased strength, decreased safety awareness, impaired flexibility, impaired tone, improper body mechanics, postural dysfunction, and pain.   ACTIVITY LIMITATIONS: carrying, lifting, bending, sitting, standing, squatting, sleeping, stairs, transfers, bed mobility, bathing, toileting, dressing, reach over head, hygiene/grooming, and locomotion level  PARTICIPATION LIMITATIONS: meal prep, cleaning, laundry, shopping, community activity, and church  PERSONAL FACTORS: Age, Past/current experiences, Time since onset of injury/illness/exacerbation, and 3+ comorbidities: CAD, DM, HTN, hx of back surgery; sciatica, L TKA 04/21/23  are also affecting patient's functional outcome.   REHAB POTENTIAL: Good  CLINICAL DECISION MAKING: Evolving/moderate complexity  EVALUATION COMPLEXITY: Moderate  PLAN:  PT FREQUENCY: 2x/week  PT DURATION: 8 weeks  PLANNED INTERVENTIONS: 97164- PT Re-evaluation, 97110-Therapeutic exercises, 97530- Therapeutic activity, 97112- Neuromuscular re-education, 97535- Self Care, 21308- Manual therapy, [phone]- Gait training, [phone]- Orthotic Fit/training, Patient/Family education, Balance training, Stair training, Taping, Dry Needling, and DME instructions  PLAN FOR NEXT SESSION: work on maintaining stability when lifting; balance training   11:53 AM, 11/02/23 M. Shary Decamp, PT, DPT Physical Therapist- [place] Office Number: [phone]

## 2023-11-02 NOTE — Telephone Encounter (Signed)
Called patient, documented in other phone note.

## 2023-11-02 NOTE — Telephone Encounter (Signed)
Pt states that from his PT he has been told he has a drop foot syndrome, right foot.   He needs Dr Frances Furbish to approve a ankle brace, please call pt to discuss.

## 2023-11-02 NOTE — Telephone Encounter (Signed)
Per Dr Frances Furbish, order placed and I also talked to the patient and let him know it has been faxed to PT @ [facility].   Received a fax receipt of confirmation.

## 2023-11-06 ENCOUNTER — Ambulatory Visit: Payer: Medicare HMO

## 2023-11-09 ENCOUNTER — Ambulatory Visit: Payer: Medicare HMO

## 2023-11-13 ENCOUNTER — Ambulatory Visit: Payer: Medicare HMO

## 2023-11-13 DIAGNOSIS — R2689 Other abnormalities of gait and mobility: Secondary | ICD-10-CM

## 2023-11-13 DIAGNOSIS — M21371 Foot drop, right foot: Secondary | ICD-10-CM

## 2023-11-13 DIAGNOSIS — R2681 Unsteadiness on feet: Secondary | ICD-10-CM

## 2023-11-13 DIAGNOSIS — R293 Abnormal posture: Secondary | ICD-10-CM

## 2023-11-13 DIAGNOSIS — G20B2 Parkinson's disease with dyskinesia, with fluctuations: Secondary | ICD-10-CM

## 2023-11-13 DIAGNOSIS — R29818 Other symptoms and signs involving the nervous system: Secondary | ICD-10-CM

## 2023-11-13 DIAGNOSIS — M6281 Muscle weakness (generalized): Secondary | ICD-10-CM | POA: Diagnosis not present

## 2023-11-13 NOTE — Therapy (Signed)
 OUTPATIENT PHYSICAL THERAPY NEURO TREATMENT   Patient Name: Ronald [name] MRN: [id number] DOB:04-Sep-1951, 73 y.o., male Today's Date: 11/13/2023   PCP: Woodroe Chen, MD  REFERRING PROVIDER: Huston Foley, MD   END OF SESSION:  PT End of Session - 11/13/23 1054     Visit Number 5    Number of Visits 17    Date for PT Re-Evaluation 12/18/23    Authorization Type [employer]    Authorization Time Period --   auth submitted   Authorization - Visit Number 4    Progress Note Due on Visit 10    PT Start Time 1100    PT Stop Time 1145    PT Time Calculation (min) 45 min    Equipment Utilized During Treatment Gait belt    Activity Tolerance Patient tolerated treatment well    Behavior During Therapy WFL for tasks assessed/performed             Past Medical History:  Diagnosis Date   Anemia    Arthritis    Bell's palsy    CAD in native artery 06/04/2020   RCA infarct 2013.  S/p RCA and OM PCI   Diabetes mellitus type 2 in nonobese (HCC) 06/04/2020   Diabetes mellitus without complication (HCC)    Essential hypertension 06/04/2020   Floaters in visual field    Hypertension    Myocardial infarction [facility])    Parkinson's disease (HCC)    Parkinson's disease (HCC) 06/04/2020   Pure hypercholesterolemia 06/04/2020   Past Surgical History:  Procedure Laterality Date   APPENDECTOMY     back epidural   08/2017   BACK SURGERY     CORONARY ANGIOPLASTY WITH STENT PLACEMENT  08/19/2012   KNEE SURGERY     TOTAL KNEE ARTHROPLASTY Left 04/21/2023   Procedure: LEFT TOTAL KNEE ARTHROPLASTY;  Surgeon: Kathryne Hitch, MD;  Location: [facility];  Service: Orthopedics;  Laterality: Left;   Patient Active Problem List   Diagnosis Date Noted   Status post total left knee replacement 04/21/2023   High risk medication use 10/28/2021   Olecranon bursitis, left elbow 04/21/2021   Personal history of COVID-19 04/13/2021   Primary osteoarthritis, 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    Essential hypertension 06/04/2020   Pure hypercholesterolemia 06/04/2020   CAD in native artery 06/04/2020   Parkinson's disease (HCC) 06/04/2020   Diabetes mellitus type 2 in nonobese (HCC) 06/04/2020   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   Iron deficiency anemia 11/14/2019   Unilateral primary osteoarthritis, left knee 04/01/2019   Chronic left-sided low back pain with left-sided sciatica 04/01/2019   Lumbar radiculopathy 08/21/2017   Chronic pain syndrome 08/04/2017   Facet arthropathy 08/04/2017   Lumbar paraspinal muscle spasm 08/04/2017   Anemia of chronic disease 07/03/2017   Dizziness 11/10/2016   Hyperlipidemia, unspecified 10/27/2016   Other intervertebral disc degeneration, lumbar region 06/30/2016   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 heart with stable angina pectoris (HCC) 02/26/2016   SOB (shortness of breath) 02/26/2016   Old MI (myocardial infarction) 02/26/2016   OAB (overactive bladder) 09/05/2013   Ischemic heart disease 09/21/2012   Bell's palsy 06/14/2012    ONSET DATE: several years  REFERRING DIAG:  R26.81 (ICD-10-CM) - Unsteadiness on feet  M62.81 (ICD-10-CM) - Muscle weakness (generalized)  G20.B2 (ICD-10-CM) - Parkinson's disease with dyskinesia and fluctuating manifestations (HCC)  R29.3 (ICD-10-CM) - Abnormal posture    THERAPY DIAG:  Muscle weakness (generalized)  Unsteadiness on feet  Other abnormalities of gait  and mobility  Other symptoms and signs involving the nervous system  Parkinson's disease with dyskinesia and fluctuating manifestations (HCC)  Abnormal posture  Right foot drop  Rationale for Evaluation and Treatment: Rehabilitation  SUBJECTIVE:                                                                                                                                                                                             SUBJECTIVE STATEMENT: Doing ok, called and obtained order for AFO.     Pt  accompanied by: self  PERTINENT HISTORY: CAD, DM, HTN, hx of back surgery; sciatica, L TKA 04/21/23  PAIN:  Are you having pain? Yes: NPRS scale: 5/10 Pain location: R LB Pain description: aching, shooting Aggravating factors: prolonged standing, lifting >25 lbs Relieving factors: taking some weight off  PRECAUTIONS: Fall  RED FLAGS: None   WEIGHT BEARING RESTRICTIONS: No  FALLS: Has patient fallen in last 6 months? Yes. Number of falls reports at least a dozen  LIVING ENVIRONMENT: Lives with: lives with their spouse Lives in: House/apartment Stairs:  2nd story with 6 steps upstairs and downstairs; 1 step to enter  Has following equipment at home: Single point cane, Walker - 2 wheeled, and upright walker, shower seat  PLOF: Independent  PATIENT GOALS: strengthening my legs and arms so I can lift things without falling   OBJECTIVE:   TODAY'S TREATMENT: 11/13/23 Activity Comments  Pt provides AFO Rx faxed from referring MD--provided contact info for local [employer] companies   Gait training  Trials w/ right AFO and SPC level surfaces. Uneven surfaces w/ cane  Lifting mechanics 1x10 modified set-up 6" box 1x10 from ground-level  Single leg support Trials of suitcase march--unable to maintain SLS RLE LLE on 6" step, trials of lift-of  Static balance Multisensory balance feet apart/together. LOB with head movements and eyes closed conditions        TODAY'S TREATMENT: 11/02/23 Activity Comments  Lifting/body mechanics Modified deadlift form 15#, adapted conditions  Seated PWR moves Slow rehearsal 1x10 w/ flow                     PATIENT EDUCATION: Education details: prognosis, POC, edu on benefits of OT- pt reports he is interested; advised pt to f/u with spine MD d/t progression of R ankle weakness compared to PT eval in June 2024. Edu on risk of falls and potential of using AFO. Advised to use RW outside and indoors or at the very least a SPC indoors d/t fall frequency-  pt agreeable Person educated: Patient Education method: Explanation, Demonstration, Tactile cues, Verbal cues, and Handouts Education  comprehension: verbalized understanding and returned demonstration  HOME EXERCISE PROGRAM: Access Code: [email or web address] URL: [email or web address] Date: 10/26/2023 Prepared by: Shary Decamp  Exercises - Sit to Stand in Stride with AFO and UE Assist in LE Alignment  - 1 x daily - 7 x weekly - 3-5 sets - 5 reps - Corner Balance Feet Together With Eyes Open  - 1 x daily - 7 x weekly - 1-3 sets - 30 sec hold - Corner Balance Feet Together With Eyes Closed  - 1 x daily - 7 x weekly - 1-3 sets - 30 sec hold - Corner Balance Feet Together: Eyes Open With Head Turns  - 1 x daily - 7 x weekly - 3 sets - 3 reps - Corner Balance Feet Together: Eyes Closed With Head Turns  - 1 x daily - 7 x weekly - 3 sets - 3 reps - Standing Gastroc Stretch  - 1 x daily - 7 x weekly - 3 sets - 60 sec hold  Note: Objective measures were completed at Evaluation unless otherwise noted.  DIAGNOSTIC FINDINGS: none recent  COGNITION: Overall cognitive status: Within functional limits for tasks assessed   SENSATION: Pt reports diminished sensation in B LEs but testing intact to light touch  COORDINATION: Alternating pronation/supination: very slightly slowed  Alternating toe tap: reduced ROM on R  Finger to nose: slight intention tremor B  MUSCLE TONE: slightly increased tone in R quad   POSTURE: L lean sitting in chair, flexed posture   LOWER EXTREMITY ROM:     Active  Right Eval Left Eval  Hip flexion    Hip extension    Hip abduction    Hip adduction    Hip internal rotation    Hip external rotation    Knee flexion  106  Knee extension  2  Ankle dorsiflexion -9 1  Ankle plantarflexion    Ankle inversion    Ankle eversion     (Blank rows = not tested)  LOWER EXTREMITY MMT:    MMT (in sitting) Right Eval Left Eval  Hip flexion 4+ 5  Hip extension     Hip abduction 4 4+  Hip adduction 4 4+  Hip internal rotation    Hip external rotation    Knee flexion 4+ 4 *c/o muscle spasm in L toes  Knee extension 4+ 4+  Ankle dorsiflexion 2 4-  Ankle plantarflexion 2+ 4  Ankle inversion    Ankle eversion    (Blank rows = not tested)     GAIT: Gait pattern: audible R foot slap, R LE slightly scissoring, hip instability and imbalance  Assistive device utilized: Single point cane Level of assistance: CGA   FUNCTIONAL TESTS:  30M walk: 18.03 sec (1.82 ft/sec) 5xSTS: 23.84 sec some retropulsion on 1st 2 reps  TREATMENT DATE: 10/23/23      GOALS: Goals reviewed with patient? Yes  SHORT TERM GOALS: Target date: 11/20/2023  Patient to be independent with initial HEP. Baseline: HEP initiated Goal status: INITIAL    LONG TERM GOALS: Target date: 12/18/2023  Patient to be independent with advanced HEP. Baseline: Not yet initiated  Goal status: INITIAL  Patient to score at least 46/56 on Berg in order to decrease risk of falls.   Baseline: NT Goal status: INITIAL  Patient to demonstrate safe ambulation with mod I with LRAD on indoor and outdoor surfaces.  Baseline: currently unsteady with SPC indoors and requires CGA Goal status: INITIAL  Patient to demonstrate 5xSTS test in <15 sec in order to decrease risk of falls.   Baseline: 5xSTS: 23.84 sec some retropulsion on 1st 2 reps  Goal status: INITIAL  Patient to verbalize understanding of fall prevention in home environment information. Baseline: Not yet initiated Goal status: INITIAL  Patient to verbalize tips to reduce freezing/festination with gait and turns. Baseline: Not yet initiated  Goal status: INITIAL   ASSESSMENT:  CLINICAL IMPRESSION: Continued gait training w/ SPC and R AFO to good effect at improving stability as without brace and  cane requiring SBA-CGA due to right lateral LOB/imbalance improved to modified independence with application of brace.  Continued with training in lifting techniques to improve functional performance and reduce risk for falls when lifting form ground level with best carryover and stability using R AFO which improved safety with transfers and gait.  Balance training to improve postural stability and facilitate righting reactions with difficulty under eyes closed and head movement conditions with tendency for retro-LOB.  Training in techniques for facilitation of righting reactions with present, but delayed strategies. Continued sessions to progress POC details to improve mobility and reduce risk for falls.    OBJECTIVE IMPAIRMENTS: Abnormal gait, decreased balance, decreased coordination, difficulty walking, decreased ROM, decreased strength, decreased safety awareness, impaired flexibility, impaired tone, improper body mechanics, postural dysfunction, and pain.   ACTIVITY LIMITATIONS: carrying, lifting, bending, sitting, standing, squatting, sleeping, stairs, transfers, bed mobility, bathing, toileting, dressing, reach over head, hygiene/grooming, and locomotion level  PARTICIPATION LIMITATIONS: meal prep, cleaning, laundry, shopping, community activity, and church  PERSONAL FACTORS: Age, Past/current experiences, Time since onset of injury/illness/exacerbation, and 3+ comorbidities: CAD, DM, HTN, hx of back surgery; sciatica, L TKA 04/21/23  are also affecting patient's functional outcome.   REHAB POTENTIAL: Good  CLINICAL DECISION MAKING: Evolving/moderate complexity  EVALUATION COMPLEXITY: Moderate  PLAN:  PT FREQUENCY: 2x/week  PT DURATION: 8 weeks  PLANNED INTERVENTIONS: 97164- PT Re-evaluation, 97110-Therapeutic exercises, 97530- Therapeutic activity, 97112- Neuromuscular re-education, 97535- Self Care, 16109- Manual therapy, [phone]- Gait training, [phone]- Orthotic Fit/training,  Patient/Family education, Balance training, Stair training, Taping, Dry Needling, and DME instructions  PLAN FOR NEXT SESSION: work on maintaining stability when lifting; balance training; supine PWR moves   10:55 AM, 11/13/23 M. Shary Decamp, PT, DPT Physical Therapist- [place] Office Number: [phone]

## 2023-11-16 ENCOUNTER — Ambulatory Visit: Payer: Medicare HMO

## 2023-11-16 DIAGNOSIS — M6281 Muscle weakness (generalized): Secondary | ICD-10-CM | POA: Diagnosis not present

## 2023-11-16 DIAGNOSIS — R29818 Other symptoms and signs involving the nervous system: Secondary | ICD-10-CM

## 2023-11-16 DIAGNOSIS — R293 Abnormal posture: Secondary | ICD-10-CM

## 2023-11-16 DIAGNOSIS — R2689 Other abnormalities of gait and mobility: Secondary | ICD-10-CM

## 2023-11-16 DIAGNOSIS — R2681 Unsteadiness on feet: Secondary | ICD-10-CM

## 2023-11-16 DIAGNOSIS — G20B2 Parkinson's disease with dyskinesia, with fluctuations: Secondary | ICD-10-CM

## 2023-11-16 NOTE — Therapy (Signed)
 OUTPATIENT PHYSICAL THERAPY NEURO TREATMENT   Patient Name: Ronald [name] MRN: [id number] DOB:[birth year], 73 y.o., male Today's Date: 11/16/2023   PCP: Woodroe Chen, MD  REFERRING PROVIDER: Huston Foley, MD   END OF SESSION:  PT End of Session - 11/16/23 1118     Visit Number 6    Number of Visits 17    Date for PT Re-Evaluation 12/18/23    Authorization Type [employer]    Authorization Time Period --   auth submitted   Authorization - Visit Number 5    Authorization - Number of Visits 12    Progress Note Due on Visit 10    PT Start Time 1115    PT Stop Time 1145    PT Time Calculation (min) 30 min    Equipment Utilized During Treatment Gait belt    Activity Tolerance Patient tolerated treatment well    Behavior During Therapy WFL for tasks assessed/performed             Past Medical History:  Diagnosis Date   Anemia    Arthritis    Bell's palsy    CAD in native artery 06/04/2020   RCA infarct 2013.  S/p RCA and OM PCI   Diabetes mellitus type 2 in nonobese (HCC) 06/04/2020   Diabetes mellitus without complication (HCC)    Essential hypertension 06/04/2020   Floaters in visual field    Hypertension    Myocardial infarction [facility])    Parkinson's disease (HCC)    Parkinson's disease (HCC) 06/04/2020   Pure hypercholesterolemia 06/04/2020   Past Surgical History:  Procedure Laterality Date   APPENDECTOMY     back epidural   08/2017   BACK SURGERY     CORONARY ANGIOPLASTY WITH STENT PLACEMENT  08/19/2012   KNEE SURGERY     TOTAL KNEE ARTHROPLASTY Left 04/21/2023   Procedure: LEFT TOTAL KNEE ARTHROPLASTY;  Surgeon: Kathryne Hitch, MD;  Location: [facility];  Service: Orthopedics;  Laterality: Left;   Patient Active Problem List   Diagnosis Date Noted   Status post total left knee replacement 04/21/2023   High risk medication use 10/28/2021   Olecranon bursitis, left elbow 04/21/2021   Personal history of COVID-19 04/13/2021   Primary  osteoarthritis, 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   Essential hypertension 06/04/2020   Pure hypercholesterolemia 06/04/2020   CAD in native artery 06/04/2020   Parkinson's disease (HCC) 06/04/2020   Diabetes mellitus type 2 in nonobese (HCC) 06/04/2020   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   Iron deficiency anemia 11/14/2019   Unilateral primary osteoarthritis, left knee 04/01/2019   Chronic left-sided low back pain with left-sided sciatica 04/01/2019   Lumbar radiculopathy 08/21/2017   Chronic pain syndrome 08/04/2017   Facet arthropathy 08/04/2017   Lumbar paraspinal muscle spasm 08/04/2017   Anemia of chronic disease 07/03/2017   Dizziness 11/10/2016   Hyperlipidemia, unspecified 10/27/2016   Other intervertebral disc degeneration, lumbar region 06/30/2016   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 heart with stable angina pectoris (HCC) 02/26/2016   SOB (shortness of breath) 02/26/2016   Old MI (myocardial infarction) 02/26/2016   OAB (overactive bladder) 09/05/2013   Ischemic heart disease 09/21/2012   Bell's palsy 06/14/2012    ONSET DATE: several years  REFERRING DIAG:  R26.81 (ICD-10-CM) - Unsteadiness on feet  M62.81 (ICD-10-CM) - Muscle weakness (generalized)  G20.B2 (ICD-10-CM) - Parkinson's disease with dyskinesia and fluctuating manifestations (HCC)  R29.3 (ICD-10-CM) - Abnormal posture    THERAPY DIAG:  Muscle weakness (generalized)  Unsteadiness on feet  Other abnormalities of gait and mobility  Other symptoms and signs involving the nervous system  Parkinson's disease with dyskinesia and fluctuating manifestations (HCC)  Abnormal posture  Rationale for Evaluation and Treatment: Rehabilitation  SUBJECTIVE:                                                                                                                                                                                             SUBJECTIVE STATEMENT: Doing ok, just the usual aches  and pains   Pt accompanied by: self  PERTINENT HISTORY: CAD, DM, HTN, hx of back surgery; sciatica, L TKA 04/21/23  PAIN:  Are you having pain? Yes: NPRS scale: 5/10 Pain location: R LB Pain description: aching, shooting Aggravating factors: prolonged standing, lifting >25 lbs Relieving factors: taking some weight off  PRECAUTIONS: Fall  RED FLAGS: None   WEIGHT BEARING RESTRICTIONS: No  FALLS: Has patient fallen in last 6 months? Yes. Number of falls reports at least a dozen  LIVING ENVIRONMENT: Lives with: lives with their spouse Lives in: House/apartment Stairs:  2nd story with 6 steps upstairs and downstairs; 1 step to enter  Has following equipment at home: Single point cane, Walker - 2 wheeled, and upright walker, shower seat  PLOF: Independent  PATIENT GOALS: strengthening my legs and arms so I can lift things without falling   OBJECTIVE:   TODAY'S TREATMENT: 11/16/23 Activity Comments  Seated PWR moves 1x10   Standing PWR moves 1x10 Chair in front for balance, limited RLE step due to weakness/instability  Supine PWR moves 1x10 Difficulty with bridge for step              TODAY'S TREATMENT: 11/13/23 Activity Comments  Pt provides AFO Rx faxed from referring MD--provided contact info for local P[employer] companies   Gait training  Trials w/ right AFO and SPC level surfaces. Uneven surfaces w/ cane  Lifting mechanics 1x10 modified set-up 6" box 1x10 from ground-level  Single leg support Trials of suitcase march--unable to maintain SLS RLE LLE on 6" step, trials of lift-of  Static balance Multisensory balance feet apart/together. LOB with head movements and eyes closed conditions              PATIENT EDUCATION: Education details: prognosis, POC, edu on benefits of OT- pt reports he is interested; advised pt to f/u with spine MD d/t progression of R ankle weakness compared to PT eval in June 2024. Edu on risk of falls and potential of using AFO. Advised to use  RW outside and indoors or at the very least a SPC indoors d/t fall frequency- pt  agreeable Person educated: Patient Education method: Explanation, Demonstration, Tactile cues, Verbal cues, and Handouts Education comprehension: verbalized understanding and returned demonstration  HOME EXERCISE PROGRAM: Access Code: [email or web address] URL: [email or web address] Date: 10/26/2023 Prepared by: Shary Decamp  Exercises - Sit to Stand in Stride with AFO and UE Assist in LE Alignment  - 1 x daily - 7 x weekly - 3-5 sets - 5 reps - Corner Balance Feet Together With Eyes Open  - 1 x daily - 7 x weekly - 1-3 sets - 30 sec hold - Corner Balance Feet Together With Eyes Closed  - 1 x daily - 7 x weekly - 1-3 sets - 30 sec hold - Corner Balance Feet Together: Eyes Open With Head Turns  - 1 x daily - 7 x weekly - 3 sets - 3 reps - Corner Balance Feet Together: Eyes Closed With Head Turns  - 1 x daily - 7 x weekly - 3 sets - 3 reps - Standing Gastroc Stretch  - 1 x daily - 7 x weekly - 3 sets - 60 sec hold  Note: Objective measures were completed at Evaluation unless otherwise noted.  DIAGNOSTIC FINDINGS: none recent  COGNITION: Overall cognitive status: Within functional limits for tasks assessed   SENSATION: Pt reports diminished sensation in B LEs but testing intact to light touch  COORDINATION: Alternating pronation/supination: very slightly slowed  Alternating toe tap: reduced ROM on R  Finger to nose: slight intention tremor B  MUSCLE TONE: slightly increased tone in R quad   POSTURE: L lean sitting in chair, flexed posture   LOWER EXTREMITY ROM:     Active  Right Eval Left Eval  Hip flexion    Hip extension    Hip abduction    Hip adduction    Hip internal rotation    Hip external rotation    Knee flexion  106  Knee extension  2  Ankle dorsiflexion -9 1  Ankle plantarflexion    Ankle inversion    Ankle eversion     (Blank rows = not tested)  LOWER EXTREMITY MMT:     MMT (in sitting) Right Eval Left Eval  Hip flexion 4+ 5  Hip extension    Hip abduction 4 4+  Hip adduction 4 4+  Hip internal rotation    Hip external rotation    Knee flexion 4+ 4 *c/o muscle spasm in L toes  Knee extension 4+ 4+  Ankle dorsiflexion 2 4-  Ankle plantarflexion 2+ 4  Ankle inversion    Ankle eversion    (Blank rows = not tested)     GAIT: Gait pattern: audible R foot slap, R LE slightly scissoring, hip instability and imbalance  Assistive device utilized: Single point cane Level of assistance: CGA   FUNCTIONAL TESTS:  62M walk: 18.03 sec (1.82 ft/sec) 5xSTS: 23.84 sec some retropulsion on 1st 2 reps  TREATMENT DATE: 10/23/23      GOALS: Goals reviewed with patient? Yes  SHORT TERM GOALS: Target date: 11/20/2023  Patient to be independent with initial HEP. Baseline: HEP initiated Goal status: INITIAL    LONG TERM GOALS: Target date: 12/18/2023  Patient to be independent with advanced HEP. Baseline: Not yet initiated  Goal status: INITIAL  Patient to score at least 46/56 on Berg in order to decrease risk of falls.   Baseline: NT Goal status: INITIAL  Patient to demonstrate safe ambulation with mod I with LRAD on indoor and outdoor surfaces.  Baseline: currently unsteady with SPC indoors and requires CGA Goal status: INITIAL  Patient to demonstrate 5xSTS test in <15 sec in order to decrease risk of falls.   Baseline: 5xSTS: 23.84 sec some retropulsion on 1st 2 reps  Goal status: INITIAL  Patient to verbalize understanding of fall prevention in home environment information. Baseline: Not yet initiated Goal status: INITIAL  Patient to verbalize tips to reduce freezing/festination with gait and turns. Baseline: Not yet initiated  Goal status: INITIAL   ASSESSMENT:  CLINICAL IMPRESSION: Instruction in seated  and standing large amplitude movements to improve motor control, balance, and flexibility with great recall and reduced need for cues. Instances of LOB during standing activities seemingly from his issues of RLE weakness and instructed in set-up for safe home performance using chair/bed behind and furniture to front for UE support as needed. Instructed in supine large amplitude movements to improve bed mobility to enable change of position to reduce his issue of freezing in bed with good carryover to transferring supine to sit. Continued sessions to progress POC details to improve mobility and reduce risk for falls   OBJECTIVE IMPAIRMENTS: Abnormal gait, decreased balance, decreased coordination, difficulty walking, decreased ROM, decreased strength, decreased safety awareness, impaired flexibility, impaired tone, improper body mechanics, postural dysfunction, and pain.   ACTIVITY LIMITATIONS: carrying, lifting, bending, sitting, standing, squatting, sleeping, stairs, transfers, bed mobility, bathing, toileting, dressing, reach over head, hygiene/grooming, and locomotion level  PARTICIPATION LIMITATIONS: meal prep, cleaning, laundry, shopping, community activity, and church  PERSONAL FACTORS: Age, Past/current experiences, Time since onset of injury/illness/exacerbation, and 3+ comorbidities: CAD, DM, HTN, hx of back surgery; sciatica, L TKA 04/21/23  are also affecting patient's functional outcome.   REHAB POTENTIAL: Good  CLINICAL DECISION MAKING: Evolving/moderate complexity  EVALUATION COMPLEXITY: Moderate  PLAN:  PT FREQUENCY: 2x/week  PT DURATION: 8 weeks  PLANNED INTERVENTIONS: 97164- PT Re-evaluation, 97110-Therapeutic exercises, 97530- Therapeutic activity, 97112- Neuromuscular re-education, 97535- Self Care, 16109- Manual therapy, [phone]- Gait training, [phone]- Orthotic Fit/training, Patient/Family education, Balance training, Stair training, Taping, Dry Needling, and DME  instructions  PLAN FOR NEXT SESSION: work on maintaining stability when lifting; balance training; supine PWR moves   11:18 AM, 11/16/23 M. Shary Decamp, PT, DPT Physical Therapist- [place] Office Number: [phone]

## 2023-11-20 ENCOUNTER — Ambulatory Visit: Payer: Medicare HMO | Attending: Neurology

## 2023-11-20 DIAGNOSIS — G20B2 Parkinson's disease with dyskinesia, with fluctuations: Secondary | ICD-10-CM

## 2023-11-20 DIAGNOSIS — M6281 Muscle weakness (generalized): Secondary | ICD-10-CM

## 2023-11-20 DIAGNOSIS — R2681 Unsteadiness on feet: Secondary | ICD-10-CM | POA: Diagnosis present

## 2023-11-20 DIAGNOSIS — R293 Abnormal posture: Secondary | ICD-10-CM

## 2023-11-20 DIAGNOSIS — R29818 Other symptoms and signs involving the nervous system: Secondary | ICD-10-CM | POA: Diagnosis present

## 2023-11-20 DIAGNOSIS — M21371 Foot drop, right foot: Secondary | ICD-10-CM | POA: Diagnosis present

## 2023-11-20 DIAGNOSIS — R2689 Other abnormalities of gait and mobility: Secondary | ICD-10-CM | POA: Diagnosis present

## 2023-11-20 NOTE — Therapy (Signed)
 OUTPATIENT PHYSICAL THERAPY NEURO TREATMENT   Patient Name: Ronald [name] MRN: [id number] DOB:1951/02/25, 73 y.o., male Today's Date: 11/20/2023   PCP: Woodroe Chen, MD  REFERRING PROVIDER: Huston Foley, MD   END OF SESSION:  PT End of Session - 11/20/23 1101     Visit Number 7    Number of Visits 17    Date for PT Re-Evaluation 12/18/23    Authorization Type [employer]    Authorization Time Period --   auth submitted   Authorization - Visit Number 6    Authorization - Number of Visits 12    Progress Note Due on Visit 10    PT Start Time 1100    PT Stop Time 1145    PT Time Calculation (min) 45 min    Equipment Utilized During Treatment Gait belt    Activity Tolerance Patient tolerated treatment well    Behavior During Therapy WFL for tasks assessed/performed             Past Medical History:  Diagnosis Date   Anemia    Arthritis    Bell's palsy    CAD in native artery 06/04/2020   RCA infarct 2013.  S/p RCA and OM PCI   Diabetes mellitus type 2 in nonobese (HCC) 06/04/2020   Diabetes mellitus without complication (HCC)    Essential hypertension 06/04/2020   Floaters in visual field    Hypertension    Myocardial infarction [facility])    Parkinson's disease (HCC)    Parkinson's disease (HCC) 06/04/2020   Pure hypercholesterolemia 06/04/2020   Past Surgical History:  Procedure Laterality Date   APPENDECTOMY     back epidural   08/2017   BACK SURGERY     CORONARY ANGIOPLASTY WITH STENT PLACEMENT  08/19/2012   KNEE SURGERY     TOTAL KNEE ARTHROPLASTY Left 04/21/2023   Procedure: LEFT TOTAL KNEE ARTHROPLASTY;  Surgeon: Kathryne Hitch, MD;  Location: [facility];  Service: Orthopedics;  Laterality: Left;   Patient Active Problem List   Diagnosis Date Noted   Status post total left knee replacement 04/21/2023   High risk medication use 10/28/2021   Olecranon bursitis, left elbow 04/21/2021   Personal history of COVID-19 04/13/2021   Primary  osteoarthritis, 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   Essential hypertension 06/04/2020   Pure hypercholesterolemia 06/04/2020   CAD in native artery 06/04/2020   Parkinson's disease (HCC) 06/04/2020   Diabetes mellitus type 2 in nonobese (HCC) 06/04/2020   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   Iron deficiency anemia 11/14/2019   Unilateral primary osteoarthritis, left knee 04/01/2019   Chronic left-sided low back pain with left-sided sciatica 04/01/2019   Lumbar radiculopathy 08/21/2017   Chronic pain syndrome 08/04/2017   Facet arthropathy 08/04/2017   Lumbar paraspinal muscle spasm 08/04/2017   Anemia of chronic disease 07/03/2017   Dizziness 11/10/2016   Hyperlipidemia, unspecified 10/27/2016   Other intervertebral disc degeneration, lumbar region 06/30/2016   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 heart with stable angina pectoris (HCC) 02/26/2016   SOB (shortness of breath) 02/26/2016   Old MI (myocardial infarction) 02/26/2016   OAB (overactive bladder) 09/05/2013   Ischemic heart disease 09/21/2012   Bell's palsy 06/14/2012    ONSET DATE: several years  REFERRING DIAG:  R26.81 (ICD-10-CM) - Unsteadiness on feet  M62.81 (ICD-10-CM) - Muscle weakness (generalized)  G20.B2 (ICD-10-CM) - Parkinson's disease with dyskinesia and fluctuating manifestations (HCC)  R29.3 (ICD-10-CM) - Abnormal posture    THERAPY DIAG:  Muscle weakness (generalized)  Unsteadiness on feet  Other abnormalities of gait and mobility  Other symptoms and signs involving the nervous system  Parkinson's disease with dyskinesia and fluctuating manifestations (HCC)  Abnormal posture  Right foot drop  Rationale for Evaluation and Treatment: Rehabilitation  SUBJECTIVE:                                                                                                                                                                                             SUBJECTIVE STATEMENT: Had a fall  over the weekend in the yard bending over to pick up weedeater and fell forward, no injury, able to self-recover.    Pt accompanied by: self  PERTINENT HISTORY: CAD, DM, HTN, hx of back surgery; sciatica, L TKA 04/21/23  PAIN:  Are you having pain? Yes: NPRS scale: 5/10 Pain location: R LB Pain description: aching, shooting Aggravating factors: prolonged standing, lifting >25 lbs Relieving factors: taking some weight off  PRECAUTIONS: Fall  RED FLAGS: None   WEIGHT BEARING RESTRICTIONS: No  FALLS: Has patient fallen in last 6 months? Yes. Number of falls reports at least a dozen  LIVING ENVIRONMENT: Lives with: lives with their spouse Lives in: House/apartment Stairs:  2nd story with 6 steps upstairs and downstairs; 1 step to enter  Has following equipment at home: Single point cane, Walker - 2 wheeled, and upright walker, shower seat  PLOF: Independent  PATIENT GOALS: strengthening my legs and arms so I can lift things without falling   OBJECTIVE:   TODAY'S TREATMENT: 11/20/23 Activity Comments  Supine PWR moves 1x10--excellent recall, 75% recall, cues for step sequence  Deadlift 2x10 15#, great hip hinge form  Paloff press 1x10 10#, 15#  Stiff arm pull down 1x10 15, 20#  Lateral steps over cones x 2 min 5# ankle weights, counter top support        TODAY'S TREATMENT: 11/16/23 Activity Comments  Seated PWR moves 1x10   Standing PWR moves 1x10 Chair in front for balance, limited RLE step due to weakness/instability  Supine PWR moves 1x10 Difficulty with bridge for step              TODAY'S TREATMENT: 11/13/23 Activity Comments  Pt provides AFO Rx faxed from referring MD--provided contact info for local [employer] companies   Gait training  Trials w/ right AFO and SPC level surfaces. Uneven surfaces w/ cane  Lifting mechanics 1x10 modified set-up 6" box 1x10 from ground-level  Single leg support Trials of suitcase march--unable to maintain SLS RLE LLE on 6" step, trials  of lift-of  Static balance Multisensory balance feet apart/together. LOB with head movements and eyes  closed conditions              PATIENT EDUCATION: Education details: prognosis, POC, edu on benefits of OT- pt reports he is interested; advised pt to f/u with spine MD d/t progression of R ankle weakness compared to PT eval in June 2024. Edu on risk of falls and potential of using AFO. Advised to use RW outside and indoors or at the very least a SPC indoors d/t fall frequency- pt agreeable Person educated: Patient Education method: Explanation, Demonstration, Tactile cues, Verbal cues, and Handouts Education comprehension: verbalized understanding and returned demonstration  HOME EXERCISE PROGRAM: Access Code: [email or web address] URL: [email or web address] Date: 10/26/2023 Prepared by: Shary Decamp  Exercises - Sit to Stand in Stride with AFO and UE Assist in LE Alignment  - 1 x daily - 7 x weekly - 3-5 sets - 5 reps - Corner Balance Feet Together With Eyes Open  - 1 x daily - 7 x weekly - 1-3 sets - 30 sec hold - Corner Balance Feet Together With Eyes Closed  - 1 x daily - 7 x weekly - 1-3 sets - 30 sec hold - Corner Balance Feet Together: Eyes Open With Head Turns  - 1 x daily - 7 x weekly - 3 sets - 3 reps - Corner Balance Feet Together: Eyes Closed With Head Turns  - 1 x daily - 7 x weekly - 3 sets - 3 reps - Standing Gastroc Stretch  - 1 x daily - 7 x weekly - 3 sets - 60 sec hold  Note: Objective measures were completed at Evaluation unless otherwise noted.  DIAGNOSTIC FINDINGS: none recent  COGNITION: Overall cognitive status: Within functional limits for tasks assessed   SENSATION: Pt reports diminished sensation in B LEs but testing intact to light touch  COORDINATION: Alternating pronation/supination: very slightly slowed  Alternating toe tap: reduced ROM on R  Finger to nose: slight intention tremor B  MUSCLE TONE: slightly increased tone in R quad    POSTURE: L lean sitting in chair, flexed posture   LOWER EXTREMITY ROM:     Active  Right Eval Left Eval  Hip flexion    Hip extension    Hip abduction    Hip adduction    Hip internal rotation    Hip external rotation    Knee flexion  106  Knee extension  2  Ankle dorsiflexion -9 1  Ankle plantarflexion    Ankle inversion    Ankle eversion     (Blank rows = not tested)  LOWER EXTREMITY MMT:    MMT (in sitting) Right Eval Left Eval  Hip flexion 4+ 5  Hip extension    Hip abduction 4 4+  Hip adduction 4 4+  Hip internal rotation    Hip external rotation    Knee flexion 4+ 4 *c/o muscle spasm in L toes  Knee extension 4+ 4+  Ankle dorsiflexion 2 4-  Ankle plantarflexion 2+ 4  Ankle inversion    Ankle eversion    (Blank rows = not tested)     GAIT: Gait pattern: audible R foot slap, R LE slightly scissoring, hip instability and imbalance  Assistive device utilized: Single point cane Level of assistance: CGA   FUNCTIONAL TESTS:  26M walk: 18.03 sec (1.82 ft/sec) 5xSTS: 23.84 sec some retropulsion on 1st 2 reps  TREATMENT DATE: 10/23/23      GOALS: Goals reviewed with patient? Yes  SHORT TERM GOALS: Target date: 11/20/2023  Patient to be independent with initial HEP. Baseline: HEP initiated Goal status: INITIAL    LONG TERM GOALS: Target date: 12/18/2023  Patient to be independent with advanced HEP. Baseline: Not yet initiated  Goal status: INITIAL  Patient to score at least 46/56 on Berg in order to decrease risk of falls.   Baseline: NT Goal status: INITIAL  Patient to demonstrate safe ambulation with mod I with LRAD on indoor and outdoor surfaces.  Baseline: currently unsteady with SPC indoors and requires CGA Goal status: INITIAL  Patient to demonstrate 5xSTS test in <15 sec in order to decrease risk of falls.    Baseline: 5xSTS: 23.84 sec some retropulsion on 1st 2 reps  Goal status: INITIAL  Patient to verbalize understanding of fall prevention in home environment information. Baseline: Not yet initiated Goal status: INITIAL  Patient to verbalize tips to reduce freezing/festination with gait and turns. Baseline: Not yet initiated  Goal status: INITIAL   ASSESSMENT:  CLINICAL IMPRESSION: Good recall to supine large amplitude movements to improve flexibility and coordination for bed mobility/position change with 25% cues for full sequence.  Review of lifting mechanics to improve balance/safety with picking things up from ground with light tactile cues to right LE to control tibial advancement. Core strength exercises to improve proximal stability and step over sequence to increase step height and single limb support. Continued sessions to meet POC requirements and reduce risk for falls with mobility   OBJECTIVE IMPAIRMENTS: Abnormal gait, decreased balance, decreased coordination, difficulty walking, decreased ROM, decreased strength, decreased safety awareness, impaired flexibility, impaired tone, improper body mechanics, postural dysfunction, and pain.   ACTIVITY LIMITATIONS: carrying, lifting, bending, sitting, standing, squatting, sleeping, stairs, transfers, bed mobility, bathing, toileting, dressing, reach over head, hygiene/grooming, and locomotion level  PARTICIPATION LIMITATIONS: meal prep, cleaning, laundry, shopping, community activity, and church  PERSONAL FACTORS: Age, Past/current experiences, Time since onset of injury/illness/exacerbation, and 3+ comorbidities: CAD, DM, HTN, hx of back surgery; sciatica, L TKA 04/21/23  are also affecting patient's functional outcome.   REHAB POTENTIAL: Good  CLINICAL DECISION MAKING: Evolving/moderate complexity  EVALUATION COMPLEXITY: Moderate  PLAN:  PT FREQUENCY: 2x/week  PT DURATION: 8 weeks  PLANNED INTERVENTIONS: 97164- PT  Re-evaluation, 97110-Therapeutic exercises, 97530- Therapeutic activity, 97112- Neuromuscular re-education, 97535- Self Care, 78295- Manual therapy, [phone]- Gait training, [phone]- Orthotic Fit/training, Patient/Family education, Balance training, Stair training, Taping, Dry Needling, and DME instructions  PLAN FOR NEXT SESSION: work on maintaining stability when lifting; balance training; supine PWR moves   11:01 AM, 11/20/23 M. Shary Decamp, PT, DPT Physical Therapist- [place] Office Number: [phone]

## 2023-11-23 ENCOUNTER — Ambulatory Visit: Payer: Medicare HMO

## 2023-11-23 DIAGNOSIS — R29818 Other symptoms and signs involving the nervous system: Secondary | ICD-10-CM

## 2023-11-23 DIAGNOSIS — M21371 Foot drop, right foot: Secondary | ICD-10-CM

## 2023-11-23 DIAGNOSIS — R2689 Other abnormalities of gait and mobility: Secondary | ICD-10-CM

## 2023-11-23 DIAGNOSIS — M6281 Muscle weakness (generalized): Secondary | ICD-10-CM | POA: Diagnosis not present

## 2023-11-23 DIAGNOSIS — R2681 Unsteadiness on feet: Secondary | ICD-10-CM

## 2023-11-23 DIAGNOSIS — G20B2 Parkinson's disease with dyskinesia, with fluctuations: Secondary | ICD-10-CM

## 2023-11-23 DIAGNOSIS — R293 Abnormal posture: Secondary | ICD-10-CM

## 2023-11-23 NOTE — Therapy (Signed)
 OUTPATIENT PHYSICAL THERAPY NEURO TREATMENT   Patient Name: Ronald [name] MRN: [id number] DOB:09/06/1951, 73 y.o., male Today's Date: 11/23/2023   PCP: Woodroe Chen, MD  REFERRING PROVIDER: Huston Foley, MD   END OF SESSION:  PT End of Session - 11/23/23 1102     Visit Number 8    Number of Visits 17    Date for PT Re-Evaluation 12/18/23    Authorization Type [employer]    Authorization Time Period --   auth submitted   Authorization - Visit Number 7    Authorization - Number of Visits 12    Progress Note Due on Visit 10    PT Start Time 1100    PT Stop Time 1145    PT Time Calculation (min) 45 min    Equipment Utilized During Treatment Gait belt    Activity Tolerance Patient tolerated treatment well    Behavior During Therapy WFL for tasks assessed/performed             Past Medical History:  Diagnosis Date   Anemia    Arthritis    Bell's palsy    CAD in native artery 06/04/2020   RCA infarct 2013.  S/p RCA and OM PCI   Diabetes mellitus type 2 in nonobese (HCC) 06/04/2020   Diabetes mellitus without complication (HCC)    Essential hypertension 06/04/2020   Floaters in visual field    Hypertension    Myocardial infarction [facility])    Parkinson's disease (HCC)    Parkinson's disease (HCC) 06/04/2020   Pure hypercholesterolemia 06/04/2020   Past Surgical History:  Procedure Laterality Date   APPENDECTOMY     back epidural   08/2017   BACK SURGERY     CORONARY ANGIOPLASTY WITH STENT PLACEMENT  08/19/2012   KNEE SURGERY     TOTAL KNEE ARTHROPLASTY Left 04/21/2023   Procedure: LEFT TOTAL KNEE ARTHROPLASTY;  Surgeon: Kathryne Hitch, MD;  Location: [facility];  Service: Orthopedics;  Laterality: Left;   Patient Active Problem List   Diagnosis Date Noted   Status post total left knee replacement 04/21/2023   High risk medication use 10/28/2021   Olecranon bursitis, left elbow 04/21/2021   Personal history of COVID-19 04/13/2021   Primary  osteoarthritis, 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   Essential hypertension 06/04/2020   Pure hypercholesterolemia 06/04/2020   CAD in native artery 06/04/2020   Parkinson's disease (HCC) 06/04/2020   Diabetes mellitus type 2 in nonobese (HCC) 06/04/2020   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   Iron deficiency anemia 11/14/2019   Unilateral primary osteoarthritis, left knee 04/01/2019   Chronic left-sided low back pain with left-sided sciatica 04/01/2019   Lumbar radiculopathy 08/21/2017   Chronic pain syndrome 08/04/2017   Facet arthropathy 08/04/2017   Lumbar paraspinal muscle spasm 08/04/2017   Anemia of chronic disease 07/03/2017   Dizziness 11/10/2016   Hyperlipidemia, unspecified 10/27/2016   Other intervertebral disc degeneration, lumbar region 06/30/2016   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 heart with stable angina pectoris (HCC) 02/26/2016   SOB (shortness of breath) 02/26/2016   Old MI (myocardial infarction) 02/26/2016   OAB (overactive bladder) 09/05/2013   Ischemic heart disease 09/21/2012   Bell's palsy 06/14/2012    ONSET DATE: several years  REFERRING DIAG:  R26.81 (ICD-10-CM) - Unsteadiness on feet  M62.81 (ICD-10-CM) - Muscle weakness (generalized)  G20.B2 (ICD-10-CM) - Parkinson's disease with dyskinesia and fluctuating manifestations (HCC)  R29.3 (ICD-10-CM) - Abnormal posture    THERAPY DIAG:  Muscle weakness (generalized)  Unsteadiness on feet  Other abnormalities of gait and mobility  Other symptoms and signs involving the nervous system  Parkinson's disease with dyskinesia and fluctuating manifestations (HCC)  Abnormal posture  Right foot drop  Rationale for Evaluation and Treatment: Rehabilitation  SUBJECTIVE:                                                                                                                                                                                             SUBJECTIVE STATEMENT: Had a fall  this AM when holding dog bowls and went to turn to the right when foot got caught, no injury sustained. Got measured for AFO yesterday, will have it in 3 weeks   Pt accompanied by: self  PERTINENT HISTORY: CAD, DM, HTN, hx of back surgery; sciatica, L TKA 04/21/23  PAIN:  Are you having pain? Yes: NPRS scale: 5/10 Pain location: R LB Pain description: aching, shooting Aggravating factors: prolonged standing, lifting >25 lbs Relieving factors: taking some weight off  PRECAUTIONS: Fall  RED FLAGS: None   WEIGHT BEARING RESTRICTIONS: No  FALLS: Has patient fallen in last 6 months? Yes. Number of falls reports at least a dozen  LIVING ENVIRONMENT: Lives with: lives with their spouse Lives in: House/apartment Stairs:  2nd story with 6 steps upstairs and downstairs; 1 step to enter  Has following equipment at home: Single point cane, Walker - 2 wheeled, and upright walker, shower seat  PLOF: Independent  PATIENT GOALS: strengthening my legs and arms so I can lift things without falling   OBJECTIVE:   TODAY'S TREATMENT: 11/23/23 Activity Comments  Gait training Instruction in AFO donning/doffing and training in gait mechanics  Sidestepping x 2 min 4# along counter  Sidestepping over cones x 2 min X 60 sec w/ 4# X 60 sec no weight  High step over cones x 2 min Cane and counter for UE support  Pivot turns         TODAY'S TREATMENT: 11/20/23 Activity Comments  Supine PWR moves 1x10--excellent recall, 75% recall, cues for step sequence  Deadlift 2x10 15#, great hip hinge form  Paloff press 1x10 10#, 15#  Stiff arm pull down 1x10 15, 20#  Lateral steps over cones x 2 min 5# ankle weights, counter top support             PATIENT EDUCATION: Education details: prognosis, POC, edu on benefits of OT- pt reports he is interested; advised pt to f/u with spine MD d/t progression of R ankle weakness compared to PT eval in June 2024. Edu on risk of falls and potential of using AFO.  Advised to  use RW outside and indoors or at the very least a SPC indoors d/t fall frequency- pt agreeable Person educated: Patient Education method: Explanation, Demonstration, Tactile cues, Verbal cues, and Handouts Education comprehension: verbalized understanding and returned demonstration  HOME EXERCISE PROGRAM: Access Code: [email or web address] URL: [email or web address] Date: 10/26/2023 Prepared by: Shary Decamp  Exercises - Sit to Stand in Stride with AFO and UE Assist in LE Alignment  - 1 x daily - 7 x weekly - 3-5 sets - 5 reps - Corner Balance Feet Together With Eyes Open  - 1 x daily - 7 x weekly - 1-3 sets - 30 sec hold - Corner Balance Feet Together With Eyes Closed  - 1 x daily - 7 x weekly - 1-3 sets - 30 sec hold - Corner Balance Feet Together: Eyes Open With Head Turns  - 1 x daily - 7 x weekly - 3 sets - 3 reps - Corner Balance Feet Together: Eyes Closed With Head Turns  - 1 x daily - 7 x weekly - 3 sets - 3 reps - Standing Gastroc Stretch  - 1 x daily - 7 x weekly - 3 sets - 60 sec hold  Note: Objective measures were completed at Evaluation unless otherwise noted.  DIAGNOSTIC FINDINGS: none recent  COGNITION: Overall cognitive status: Within functional limits for tasks assessed   SENSATION: Pt reports diminished sensation in B LEs but testing intact to light touch  COORDINATION: Alternating pronation/supination: very slightly slowed  Alternating toe tap: reduced ROM on R  Finger to nose: slight intention tremor B  MUSCLE TONE: slightly increased tone in R quad   POSTURE: L lean sitting in chair, flexed posture   LOWER EXTREMITY ROM:     Active  Right Eval Left Eval  Hip flexion    Hip extension    Hip abduction    Hip adduction    Hip internal rotation    Hip external rotation    Knee flexion  106  Knee extension  2  Ankle dorsiflexion -9 1  Ankle plantarflexion    Ankle inversion    Ankle eversion     (Blank rows = not tested)  LOWER  EXTREMITY MMT:    MMT (in sitting) Right Eval Left Eval  Hip flexion 4+ 5  Hip extension    Hip abduction 4 4+  Hip adduction 4 4+  Hip internal rotation    Hip external rotation    Knee flexion 4+ 4 *c/o muscle spasm in L toes  Knee extension 4+ 4+  Ankle dorsiflexion 2 4-  Ankle plantarflexion 2+ 4  Ankle inversion    Ankle eversion    (Blank rows = not tested)     GAIT: Gait pattern: audible R foot slap, R LE slightly scissoring, hip instability and imbalance  Assistive device utilized: Single point cane Level of assistance: CGA   FUNCTIONAL TESTS:  67M walk: 18.03 sec (1.82 ft/sec) 5xSTS: 23.84 sec some retropulsion on 1st 2 reps  TREATMENT DATE: 10/23/23      GOALS: Goals reviewed with patient? Yes  SHORT TERM GOALS: Target date: 11/20/2023  Patient to be independent with initial HEP. Baseline: HEP initiated Goal status: MET    LONG TERM GOALS: Target date: 12/18/2023  Patient to be independent with advanced HEP. Baseline: Not yet initiated  Goal status: INITIAL  Patient to score at least 46/56 on Berg in order to decrease risk of falls.   Baseline: NT Goal status: INITIAL  Patient to demonstrate safe ambulation with mod I with LRAD on indoor and outdoor surfaces.  Baseline: currently unsteady with SPC indoors and requires CGA Goal status: INITIAL  Patient to demonstrate 5xSTS test in <15 sec in order to decrease risk of falls.   Baseline: 5xSTS: 23.84 sec some retropulsion on 1st 2 reps  Goal status: INITIAL  Patient to verbalize understanding of fall prevention in home environment information. Baseline: Not yet initiated Goal status: INITIAL  Patient to verbalize tips to reduce freezing/festination with gait and turns. Baseline: Not yet initiated  Goal status: INITIAL   ASSESSMENT:  CLINICAL IMPRESSION: Reports  additional fall when turning to the right and foot drop caused him to trip and fall right, no injury sustained.  Repeat gait  training trial with AFO to right foot with improved stability in swing and loading response as evident by ability to maintain heel strike initial contact and more narrow BOS without foot drop and even able to safely ambulate without AD provided support of AFO.  Demo independence with initial HEP. Dynamic balance activities to improve single limb support and change of direction whilst wearing AFO to improve safety with household activities with ability to do so modified independent with AFO vs SBA-CGA without. Continued sessions to progress POC details to improve mobility and reduce risk for falls.    OBJECTIVE IMPAIRMENTS: Abnormal gait, decreased balance, decreased coordination, difficulty walking, decreased ROM, decreased strength, decreased safety awareness, impaired flexibility, impaired tone, improper body mechanics, postural dysfunction, and pain.   ACTIVITY LIMITATIONS: carrying, lifting, bending, sitting, standing, squatting, sleeping, stairs, transfers, bed mobility, bathing, toileting, dressing, reach over head, hygiene/grooming, and locomotion level  PARTICIPATION LIMITATIONS: meal prep, cleaning, laundry, shopping, community activity, and church  PERSONAL FACTORS: Age, Past/current experiences, Time since onset of injury/illness/exacerbation, and 3+ comorbidities: CAD, DM, HTN, hx of back surgery; sciatica, L TKA 04/21/23  are also affecting patient's functional outcome.   REHAB POTENTIAL: Good  CLINICAL DECISION MAKING: Evolving/moderate complexity  EVALUATION COMPLEXITY: Moderate  PLAN:  PT FREQUENCY: 2x/week  PT DURATION: 8 weeks  PLANNED INTERVENTIONS: 97164- PT Re-evaluation, 97110-Therapeutic exercises, 97530- Therapeutic activity, 97112- Neuromuscular re-education, 97535- Self Care, 65784- Manual therapy, [phone]- Gait training, [phone]- Orthotic  Fit/training, Patient/Family education, Balance training, Stair training, Taping, Dry Needling, and DME instructions  PLAN FOR NEXT SESSION: work on maintaining stability when lifting; balance training   11:02 AM, 11/23/23 M. Shary Decamp, PT, DPT Physical Therapist- [place] Office Number: [phone]

## 2023-11-27 ENCOUNTER — Ambulatory Visit: Payer: Medicare HMO

## 2023-11-27 DIAGNOSIS — G20B2 Parkinson's disease with dyskinesia, with fluctuations: Secondary | ICD-10-CM

## 2023-11-27 DIAGNOSIS — M6281 Muscle weakness (generalized): Secondary | ICD-10-CM | POA: Diagnosis not present

## 2023-11-27 DIAGNOSIS — R293 Abnormal posture: Secondary | ICD-10-CM

## 2023-11-27 DIAGNOSIS — R2689 Other abnormalities of gait and mobility: Secondary | ICD-10-CM

## 2023-11-27 DIAGNOSIS — R29818 Other symptoms and signs involving the nervous system: Secondary | ICD-10-CM

## 2023-11-27 DIAGNOSIS — M21371 Foot drop, right foot: Secondary | ICD-10-CM

## 2023-11-27 DIAGNOSIS — R2681 Unsteadiness on feet: Secondary | ICD-10-CM

## 2023-11-27 NOTE — Therapy (Signed)
 OUTPATIENT PHYSICAL THERAPY NEURO TREATMENT   Patient Name: Ronald [name] MRN: [id number] DOB:27-Oct-1950, 73 y.o., male Today's Date: 11/27/2023   PCP: Woodroe Chen, MD  REFERRING PROVIDER: Huston Foley, MD   END OF SESSION:  PT End of Session - 11/27/23 1109     Visit Number 9    Number of Visits 17    Date for PT Re-Evaluation 12/18/23    Authorization Type [employer]    Authorization Time Period --   auth submitted   Authorization - Visit Number 8    Authorization - Number of Visits 12    Progress Note Due on Visit 10    PT Start Time 1105    PT Stop Time 1145    PT Time Calculation (min) 40 min    Equipment Utilized During Treatment Gait belt    Activity Tolerance Patient tolerated treatment well    Behavior During Therapy WFL for tasks assessed/performed             Past Medical History:  Diagnosis Date   Anemia    Arthritis    Bell's palsy    CAD in native artery 06/04/2020   RCA infarct 2013.  S/p RCA and OM PCI   Diabetes mellitus type 2 in nonobese (HCC) 06/04/2020   Diabetes mellitus without complication (HCC)    Essential hypertension 06/04/2020   Floaters in visual field    Hypertension    Myocardial infarction [facility])    Parkinson's disease (HCC)    Parkinson's disease (HCC) 06/04/2020   Pure hypercholesterolemia 06/04/2020   Past Surgical History:  Procedure Laterality Date   APPENDECTOMY     back epidural   08/2017   BACK SURGERY     CORONARY ANGIOPLASTY WITH STENT PLACEMENT  08/19/2012   KNEE SURGERY     TOTAL KNEE ARTHROPLASTY Left 04/21/2023   Procedure: LEFT TOTAL KNEE ARTHROPLASTY;  Surgeon: Kathryne Hitch, MD;  Location: [facility];  Service: Orthopedics;  Laterality: Left;   Patient Active Problem List   Diagnosis Date Noted   Status post total left knee replacement 04/21/2023   High risk medication use 10/28/2021   Olecranon bursitis, left elbow 04/21/2021   Personal history of COVID-19 04/13/2021   Primary  osteoarthritis, 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   Essential hypertension 06/04/2020   Pure hypercholesterolemia 06/04/2020   CAD in native artery 06/04/2020   Parkinson's disease (HCC) 06/04/2020   Diabetes mellitus type 2 in nonobese (HCC) 06/04/2020   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   Iron deficiency anemia 11/14/2019   Unilateral primary osteoarthritis, left knee 04/01/2019   Chronic left-sided low back pain with left-sided sciatica 04/01/2019   Lumbar radiculopathy 08/21/2017   Chronic pain syndrome 08/04/2017   Facet arthropathy 08/04/2017   Lumbar paraspinal muscle spasm 08/04/2017   Anemia of chronic disease 07/03/2017   Dizziness 11/10/2016   Hyperlipidemia, unspecified 10/27/2016   Other intervertebral disc degeneration, lumbar region 06/30/2016   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 heart with stable angina pectoris (HCC) 02/26/2016   SOB (shortness of breath) 02/26/2016   Old MI (myocardial infarction) 02/26/2016   OAB (overactive bladder) 09/05/2013   Ischemic heart disease 09/21/2012   Bell's palsy 06/14/2012    ONSET DATE: several years  REFERRING DIAG:  R26.81 (ICD-10-CM) - Unsteadiness on feet  M62.81 (ICD-10-CM) - Muscle weakness (generalized)  G20.B2 (ICD-10-CM) - Parkinson's disease with dyskinesia and fluctuating manifestations (HCC)  R29.3 (ICD-10-CM) - Abnormal posture    THERAPY DIAG:  Muscle weakness (generalized)  Unsteadiness on feet  Other abnormalities of gait and mobility  Other symptoms and signs involving the nervous system  Parkinson's disease with dyskinesia and fluctuating manifestations (HCC)  Abnormal posture  Right foot drop  Rationale for Evaluation and Treatment: Rehabilitation  SUBJECTIVE:                                                                                                                                                                                             SUBJECTIVE STATEMENT: Weekend went  ok. No falls, using the AFO in progressive wearing fashion   Pt accompanied by: self  PERTINENT HISTORY: CAD, DM, HTN, hx of back surgery; sciatica, L TKA 04/21/23  PAIN:  Are you having pain? Yes: NPRS scale: 5/10 Pain location: R LB Pain description: aching, shooting Aggravating factors: prolonged standing, lifting >25 lbs Relieving factors: taking some weight off  PRECAUTIONS: Fall  RED FLAGS: None   WEIGHT BEARING RESTRICTIONS: No  FALLS: Has patient fallen in last 6 months? Yes. Number of falls reports at least a dozen  LIVING ENVIRONMENT: Lives with: lives with their spouse Lives in: House/apartment Stairs:  2nd story with 6 steps upstairs and downstairs; 1 step to enter  Has following equipment at home: Single point cane, Walker - 2 wheeled, and upright walker, shower seat  PLOF: Independent  PATIENT GOALS: strengthening my legs and arms so I can lift things without falling   OBJECTIVE:   TODAY'S TREATMENT: 11/27/23 Activity Comments  Deadlift 1x10 2" box, 15# KB 1x10 from ground level  Sit-stand 2x10 Front squat 2x5# dumbell  Throwing tasks 1x10 Front pass, granny toss, shot-pu, twist toss--green med Marine scientist board x 2 min. Cues for upright posture. Multisensory on foam               PATIENT EDUCATION: Education details: prognosis, POC, edu on benefits of OT- pt reports he is interested; advised pt to f/u with spine MD d/t progression of R ankle weakness compared to PT eval in June 2024. Edu on risk of falls and potential of using AFO. Advised to use RW outside and indoors or at the very least a SPC indoors d/t fall frequency- pt agreeable Person educated: Patient Education method: Explanation, Demonstration, Tactile cues, Verbal cues, and Handouts Education comprehension: verbalized understanding and returned demonstration  HOME EXERCISE PROGRAM: Access Code: [email or web address] URL: [email or web address] Date: 10/26/2023 Prepared by:  Shary Decamp  Exercises - Sit to Stand in Stride with AFO and UE Assist in LE Alignment  - 1 x daily - 7 x weekly - 3-5 sets - 5 reps - Corner Balance Feet Together  With Eyes Open  - 1 x daily - 7 x weekly - 1-3 sets - 30 sec hold - Corner Balance Feet Together With Eyes Closed  - 1 x daily - 7 x weekly - 1-3 sets - 30 sec hold - Corner Balance Feet Together: Eyes Open With Head Turns  - 1 x daily - 7 x weekly - 3 sets - 3 reps - Corner Balance Feet Together: Eyes Closed With Head Turns  - 1 x daily - 7 x weekly - 3 sets - 3 reps - Standing Gastroc Stretch  - 1 x daily - 7 x weekly - 3 sets - 60 sec hold  Note: Objective measures were completed at Evaluation unless otherwise noted.  DIAGNOSTIC FINDINGS: none recent  COGNITION: Overall cognitive status: Within functional limits for tasks assessed   SENSATION: Pt reports diminished sensation in B LEs but testing intact to light touch  COORDINATION: Alternating pronation/supination: very slightly slowed  Alternating toe tap: reduced ROM on R  Finger to nose: slight intention tremor B  MUSCLE TONE: slightly increased tone in R quad   POSTURE: L lean sitting in chair, flexed posture   LOWER EXTREMITY ROM:     Active  Right Eval Left Eval  Hip flexion    Hip extension    Hip abduction    Hip adduction    Hip internal rotation    Hip external rotation    Knee flexion  106  Knee extension  2  Ankle dorsiflexion -9 1  Ankle plantarflexion    Ankle inversion    Ankle eversion     (Blank rows = not tested)  LOWER EXTREMITY MMT:    MMT (in sitting) Right Eval Left Eval  Hip flexion 4+ 5  Hip extension    Hip abduction 4 4+  Hip adduction 4 4+  Hip internal rotation    Hip external rotation    Knee flexion 4+ 4 *c/o muscle spasm in L toes  Knee extension 4+ 4+  Ankle dorsiflexion 2 4-  Ankle plantarflexion 2+ 4  Ankle inversion    Ankle eversion    (Blank rows = not tested)     GAIT: Gait pattern: audible R  foot slap, R LE slightly scissoring, hip instability and imbalance  Assistive device utilized: Single point cane Level of assistance: CGA   FUNCTIONAL TESTS:  57M walk: 18.03 sec (1.82 ft/sec) 5xSTS: 23.84 sec some retropulsion on 1st 2 reps                                                                                                                               TREATMENT DATE: 10/23/23      GOALS: Goals reviewed with patient? Yes  SHORT TERM GOALS: Target date: 11/20/2023  Patient to be independent with initial HEP. Baseline: HEP initiated Goal status: MET    LONG TERM GOALS: Target date: 12/18/2023  Patient to be independent with advanced HEP. Baseline:  Not yet initiated  Goal status: INITIAL  Patient to score at least 46/56 on Berg in order to decrease risk of falls.   Baseline: NT Goal status: INITIAL  Patient to demonstrate safe ambulation with mod I with LRAD on indoor and outdoor surfaces.  Baseline: currently unsteady with SPC indoors and requires CGA Goal status: INITIAL  Patient to demonstrate 5xSTS test in <15 sec in order to decrease risk of falls.   Baseline: 5xSTS: 23.84 sec some retropulsion on 1st 2 reps  Goal status: INITIAL  Patient to verbalize understanding of fall prevention in home environment information. Baseline: Not yet initiated Goal status: INITIAL  Patient to verbalize tips to reduce freezing/festination with gait and turns. Baseline: Not yet initiated  Goal status: INITIAL   ASSESSMENT:  CLINICAL IMPRESSION: Continued with training for functional lifting with ability to progress to lifting weight from ground level (handle height 8") to improve body mechanics and stability for managaing weight from more precarious positions to reduce risk for falls with need for RLE stabilization to table approx 25% of trials. Continued with balance training to improve postural awareness/stability to facilitate righting reactions to reduce risk for  falls.  Retro-LOB with eyes closed conditions after 3-5 sec each trial.  Tolerated gross motor coordination activities quite well other than onset of fatigue requiring rest periods every 20 reps. Continued sessions to advance POC details to improve mobility, strength, and balance for reduced risk fro falls   OBJECTIVE IMPAIRMENTS: Abnormal gait, decreased balance, decreased coordination, difficulty walking, decreased ROM, decreased strength, decreased safety awareness, impaired flexibility, impaired tone, improper body mechanics, postural dysfunction, and pain.   ACTIVITY LIMITATIONS: carrying, lifting, bending, sitting, standing, squatting, sleeping, stairs, transfers, bed mobility, bathing, toileting, dressing, reach over head, hygiene/grooming, and locomotion level  PARTICIPATION LIMITATIONS: meal prep, cleaning, laundry, shopping, community activity, and church  PERSONAL FACTORS: Age, Past/current experiences, Time since onset of injury/illness/exacerbation, and 3+ comorbidities: CAD, DM, HTN, hx of back surgery; sciatica, L TKA 04/21/23  are also affecting patient's functional outcome.   REHAB POTENTIAL: Good  CLINICAL DECISION MAKING: Evolving/moderate complexity  EVALUATION COMPLEXITY: Moderate  PLAN:  PT FREQUENCY: 2x/week  PT DURATION: 8 weeks  PLANNED INTERVENTIONS: 97164- PT Re-evaluation, 97110-Therapeutic exercises, 97530- Therapeutic activity, 97112- Neuromuscular re-education, 97535- Self Care, 14782- Manual therapy, [phone]- Gait training, [phone]- Orthotic Fit/training, Patient/Family education, Balance training, Stair training, Taping, Dry Needling, and DME instructions  PLAN FOR NEXT SESSION: work on maintaining stability when lifting; balance training   11:09 AM, 11/27/23 M. Shary Decamp, PT, DPT Physical Therapist- [place] Office Number: [phone]

## 2023-11-28 ENCOUNTER — Other Ambulatory Visit: Payer: Self-pay | Admitting: Adult Health

## 2023-11-28 ENCOUNTER — Other Ambulatory Visit (HOSPITAL_BASED_OUTPATIENT_CLINIC_OR_DEPARTMENT_OTHER): Payer: Self-pay | Admitting: Cardiovascular Disease

## 2023-11-30 ENCOUNTER — Ambulatory Visit: Payer: Medicare HMO

## 2023-11-30 DIAGNOSIS — M6281 Muscle weakness (generalized): Secondary | ICD-10-CM | POA: Diagnosis not present

## 2023-11-30 DIAGNOSIS — M21371 Foot drop, right foot: Secondary | ICD-10-CM

## 2023-11-30 DIAGNOSIS — R293 Abnormal posture: Secondary | ICD-10-CM

## 2023-11-30 DIAGNOSIS — R29818 Other symptoms and signs involving the nervous system: Secondary | ICD-10-CM

## 2023-11-30 DIAGNOSIS — G20B2 Parkinson's disease with dyskinesia, with fluctuations: Secondary | ICD-10-CM

## 2023-11-30 DIAGNOSIS — R2689 Other abnormalities of gait and mobility: Secondary | ICD-10-CM

## 2023-11-30 DIAGNOSIS — R2681 Unsteadiness on feet: Secondary | ICD-10-CM

## 2023-11-30 NOTE — Therapy (Signed)
 OUTPATIENT PHYSICAL THERAPY NEURO TREATMENT and Progress Note   Patient Name: Ronald [name] MRN: [id number] DOB:1951-04-08, 73 y.o., male Today's Date: 11/30/2023   PCP: Woodroe Chen, MD REFERRING PROVIDER: Huston Foley, MD  Progress Note Reporting Period 10/23/23 to 11/30/23  See note below for Objective Data and Assessment of Progress/Goals.      END OF SESSION:  PT End of Session - 11/30/23 1111     Visit Number 10    Number of Visits 17    Date for PT Re-Evaluation 12/18/23    Authorization Type [employer]    Authorization Time Period --   auth submitted   Authorization - Visit Number 9    Authorization - Number of Visits 12    Progress Note Due on Visit 10    PT Start Time 1105    PT Stop Time 1145    PT Time Calculation (min) 40 min    Equipment Utilized During Treatment Gait belt    Activity Tolerance Patient tolerated treatment well    Behavior During Therapy WFL for tasks assessed/performed             Past Medical History:  Diagnosis Date   Anemia    Arthritis    Bell's palsy    CAD in native artery 06/04/2020   RCA infarct 2013.  S/p RCA and OM PCI   Diabetes mellitus type 2 in nonobese (HCC) 06/04/2020   Diabetes mellitus without complication (HCC)    Essential hypertension 06/04/2020   Floaters in visual field    Hypertension    Myocardial infarction [facility])    Parkinson's disease (HCC)    Parkinson's disease (HCC) 06/04/2020   Pure hypercholesterolemia 06/04/2020   Past Surgical History:  Procedure Laterality Date   APPENDECTOMY     back epidural   08/2017   BACK SURGERY     CORONARY ANGIOPLASTY WITH STENT PLACEMENT  08/19/2012   KNEE SURGERY     TOTAL KNEE ARTHROPLASTY Left 04/21/2023   Procedure: LEFT TOTAL KNEE ARTHROPLASTY;  Surgeon: Kathryne Hitch, MD;  Location: [facility];  Service: Orthopedics;  Laterality: Left;   Patient Active Problem List   Diagnosis Date Noted   Status post total left knee replacement 04/21/2023    High risk medication use 10/28/2021   Olecranon bursitis, left elbow 04/21/2021   Personal history of COVID-19 04/13/2021   Primary osteoarthritis, 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   Essential hypertension 06/04/2020   Pure hypercholesterolemia 06/04/2020   CAD in native artery 06/04/2020   Parkinson's disease (HCC) 06/04/2020   Diabetes mellitus type 2 in nonobese (HCC) 06/04/2020   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   Iron deficiency anemia 11/14/2019   Unilateral primary osteoarthritis, left knee 04/01/2019   Chronic left-sided low back pain with left-sided sciatica 04/01/2019   Lumbar radiculopathy 08/21/2017   Chronic pain syndrome 08/04/2017   Facet arthropathy 08/04/2017   Lumbar paraspinal muscle spasm 08/04/2017   Anemia of chronic disease 07/03/2017   Dizziness 11/10/2016   Hyperlipidemia, unspecified 10/27/2016   Other intervertebral disc degeneration, lumbar region 06/30/2016   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 heart with stable angina pectoris (HCC) 02/26/2016   SOB (shortness of breath) 02/26/2016   Old MI (myocardial infarction) 02/26/2016   OAB (overactive bladder) 09/05/2013   Ischemic heart disease 09/21/2012   Bell's palsy 06/14/2012    ONSET DATE: several years  REFERRING DIAG:  R26.81 (ICD-10-CM) - Unsteadiness on feet  M62.81 (ICD-10-CM) - Muscle weakness (generalized)  G20.B2 (ICD-10-CM) -  Parkinson's disease with dyskinesia and fluctuating manifestations (HCC)  R29.3 (ICD-10-CM) - Abnormal posture    THERAPY DIAG:  Muscle weakness (generalized)  Unsteadiness on feet  Other abnormalities of gait and mobility  Other symptoms and signs involving the nervous system  Parkinson's disease with dyskinesia and fluctuating manifestations (HCC)  Abnormal posture  Right foot drop  Rationale for Evaluation and Treatment: Rehabilitation  SUBJECTIVE:                                                                                                                                                                                              SUBJECTIVE STATEMENT: Doing well. The AFO is helpful for balance and walking   Pt accompanied by: self  PERTINENT HISTORY: CAD, DM, HTN, hx of back surgery; sciatica, L TKA 04/21/23  PAIN:  Are you having pain? Yes: NPRS scale: 5/10 Pain location: R LB Pain description: aching, shooting Aggravating factors: prolonged standing, lifting >25 lbs Relieving factors: taking some weight off  PRECAUTIONS: Fall  RED FLAGS: None   WEIGHT BEARING RESTRICTIONS: No  FALLS: Has patient fallen in last 6 months? Yes. Number of falls reports at least a dozen  LIVING ENVIRONMENT: Lives with: lives with their spouse Lives in: House/apartment Stairs:  2nd story with 6 steps upstairs and downstairs; 1 step to enter  Has following equipment at home: Single point cane, Walker - 2 wheeled, and upright walker, shower seat  PLOF: Independent  PATIENT GOALS: strengthening my legs and arms so I can lift things without falling   OBJECTIVE:   TODAY'S TREATMENT: 11/30/23 Activity Comments  Chair squats w/ front rack 3x10 7#  Modified deadlift 3x10 25#, 4" box--perfect hip hinge--strained back on final repetitions  Berg Balance Test 43/56  NU-step x 8 min Resistance 4, steady state 90+ SPM for conditioning            [facility] PT Assessment - 11/30/23 0001       Berg Balance Test   Sit to Stand Able to stand without using hands and stabilize independently    Standing Unsupported Able to stand safely 2 minutes    Sitting with Back Unsupported but Feet Supported on Floor or Stool Able to sit safely and securely 2 minutes    Stand to Sit Controls descent by using hands    Transfers Able to transfer safely, definite need of hands    Standing Unsupported with Eyes Closed Able to stand 10 seconds safely    Standing Unsupported with Feet Together Able to place feet together independently and stand 1 minute  safely    From Standing, Reach Forward  with Outstretched Arm Can reach forward >12 cm safely (5")    From Standing Position, Pick up Object from Floor Able to pick up shoe safely and easily    From Standing Position, Turn to Look Behind Over each Shoulder Looks behind from both sides and weight shifts well    Turn 360 Degrees Able to turn 360 degrees safely but slowly    Standing Unsupported, Alternately Place Feet on Step/Stool Able to complete >2 steps/needs minimal assist    Standing Unsupported, One Foot in Front Able to take small step independently and hold 30 seconds    Standing on One Leg Tries to lift leg/unable to hold 3 seconds but remains standing independently    Total Score 43                    PATIENT EDUCATION: Education details: progress note details Person educated: Patient Education method: Explanation, Demonstration, Tactile cues, Verbal cues, and Handouts Education comprehension: verbalized understanding and returned demonstration  HOME EXERCISE PROGRAM: Access Code: [email or web address] URL: [email or web address] Date: 10/26/2023 Prepared by: Shary Decamp  Exercises - Sit to Stand in Stride with AFO and UE Assist in LE Alignment  - 1 x daily - 7 x weekly - 3-5 sets - 5 reps - Corner Balance Feet Together With Eyes Open  - 1 x daily - 7 x weekly - 1-3 sets - 30 sec hold - Corner Balance Feet Together With Eyes Closed  - 1 x daily - 7 x weekly - 1-3 sets - 30 sec hold - Corner Balance Feet Together: Eyes Open With Head Turns  - 1 x daily - 7 x weekly - 3 sets - 3 reps - Corner Balance Feet Together: Eyes Closed With Head Turns  - 1 x daily - 7 x weekly - 3 sets - 3 reps - Standing Gastroc Stretch  - 1 x daily - 7 x weekly - 3 sets - 60 sec hold  Note: Objective measures were completed at Evaluation unless otherwise noted.  DIAGNOSTIC FINDINGS: none recent  COGNITION: Overall cognitive status: Within functional limits for tasks  assessed   SENSATION: Pt reports diminished sensation in B LEs but testing intact to light touch  COORDINATION: Alternating pronation/supination: very slightly slowed  Alternating toe tap: reduced ROM on R  Finger to nose: slight intention tremor B  MUSCLE TONE: slightly increased tone in R quad   POSTURE: L lean sitting in chair, flexed posture   LOWER EXTREMITY ROM:     Active  Right Eval Left Eval  Hip flexion    Hip extension    Hip abduction    Hip adduction    Hip internal rotation    Hip external rotation    Knee flexion  106  Knee extension  2  Ankle dorsiflexion -9 1  Ankle plantarflexion    Ankle inversion    Ankle eversion     (Blank rows = not tested)  LOWER EXTREMITY MMT:    MMT (in sitting) Right Eval Left Eval  Hip flexion 4+ 5  Hip extension    Hip abduction 4 4+  Hip adduction 4 4+  Hip internal rotation    Hip external rotation    Knee flexion 4+ 4 *c/o muscle spasm in L toes  Knee extension 4+ 4+  Ankle dorsiflexion 2 4-  Ankle plantarflexion 2+ 4  Ankle inversion    Ankle eversion    (Blank rows = not tested)     GAIT: Gait  pattern: audible R foot slap, R LE slightly scissoring, hip instability and imbalance  Assistive device utilized: Single point cane Level of assistance: CGA   FUNCTIONAL TESTS:  75M walk: 18.03 sec (1.82 ft/sec) 5xSTS: 23.84 sec some retropulsion on 1st 2 reps                                                                                                                               TREATMENT DATE: 10/23/23      GOALS: Goals reviewed with patient? Yes  SHORT TERM GOALS: Target date: 11/20/2023  Patient to be independent with initial HEP. Baseline: HEP initiated Goal status: MET    LONG TERM GOALS: Target date: 12/18/2023  Patient to be independent with advanced HEP. Baseline: Not yet initiated  Goal status: IN PROGRESS  Patient to score at least 46/56 on Berg in order to decrease risk of falls.    Baseline: 43/56 Goal status: IN PROGRESS  Patient to demonstrate safe ambulation with mod I with LRAD on indoor and outdoor surfaces.  Baseline: currently unsteady with SPC indoors and requires CGA Goal status: IN PROGRESS  Patient to demonstrate 5xSTS test in <15 sec in order to decrease risk of falls.   Baseline: 5xSTS: 23.84 sec some retropulsion on 1st 2 reps  Goal status: IN PROGRESS  Patient to verbalize understanding of fall prevention in home environment information. Baseline: Not yet initiated Goal status: INITIAL  Patient to verbalize tips to reduce freezing/festination with gait and turns. Baseline: Not yet initiated  Goal status: INITIAL   ASSESSMENT:  CLINICAL IMPRESSION: Initiated session with resistance training to improve LE power with increase in resistance for chair squats and increased weight to 25# modified deadlift with good form demonstrated ubt unfortunately back strain lower left lumber towards end of reps.  Berg Balance Test performed with score 43/56 with greatest difficulty under conditions of narrow BOS and/or single limb support.  Ended session with NU-step and moist heat to back for conditioning. Did not attempt 5xSTS due to onset of back strain. Continued sessions to advance POC details to improve mobility and reduce risk for falls.    OBJECTIVE IMPAIRMENTS: Abnormal gait, decreased balance, decreased coordination, difficulty walking, decreased ROM, decreased strength, decreased safety awareness, impaired flexibility, impaired tone, improper body mechanics, postural dysfunction, and pain.   ACTIVITY LIMITATIONS: carrying, lifting, bending, sitting, standing, squatting, sleeping, stairs, transfers, bed mobility, bathing, toileting, dressing, reach over head, hygiene/grooming, and locomotion level  PARTICIPATION LIMITATIONS: meal prep, cleaning, laundry, shopping, community activity, and church  PERSONAL FACTORS: Age, Past/current experiences, Time  since onset of injury/illness/exacerbation, and 3+ comorbidities: CAD, DM, HTN, hx of back surgery; sciatica, L TKA 04/21/23  are also affecting patient's functional outcome.   REHAB POTENTIAL: Good  CLINICAL DECISION MAKING: Evolving/moderate complexity  EVALUATION COMPLEXITY: Moderate  PLAN:  PT FREQUENCY: 2x/week  PT DURATION: 8 weeks  PLANNED INTERVENTIONS: 97164- PT Re-evaluation, 97110-Therapeutic exercises, 97530- Therapeutic activity, [id number]- Neuromuscular re-education, 97535-  Self Care, 54098- Manual therapy, [phone]- Gait training, [phone]- Orthotic Fit/training, Patient/Family education, Balance training, Stair training, Taping, Dry Needling, and DME instructions  PLAN FOR NEXT SESSION: work on maintaining stability when lifting; balance training   11:12 AM, 11/30/23 M. Shary Decamp, PT, DPT Physical Therapist- [place] Office Number: [phone]

## 2023-12-04 ENCOUNTER — Ambulatory Visit: Payer: Medicare HMO

## 2023-12-04 DIAGNOSIS — M6281 Muscle weakness (generalized): Secondary | ICD-10-CM

## 2023-12-04 DIAGNOSIS — R2681 Unsteadiness on feet: Secondary | ICD-10-CM

## 2023-12-04 DIAGNOSIS — R293 Abnormal posture: Secondary | ICD-10-CM

## 2023-12-04 DIAGNOSIS — M21371 Foot drop, right foot: Secondary | ICD-10-CM

## 2023-12-04 DIAGNOSIS — R29818 Other symptoms and signs involving the nervous system: Secondary | ICD-10-CM

## 2023-12-04 DIAGNOSIS — R2689 Other abnormalities of gait and mobility: Secondary | ICD-10-CM

## 2023-12-04 DIAGNOSIS — G20B2 Parkinson's disease with dyskinesia, with fluctuations: Secondary | ICD-10-CM

## 2023-12-04 NOTE — Therapy (Signed)
 OUTPATIENT PHYSICAL THERAPY NEURO TREATMENT   Patient Name: Ronald [name] MRN: [id number] DOB:[birth year], 73 y.o., male Today's Date: 12/04/2023   PCP: Woodroe Chen, MD REFERRING PROVIDER: Huston Foley, MD   END OF SESSION:  PT End of Session - 12/04/23 1105     Visit Number 11    Number of Visits 17    Date for PT Re-Evaluation 12/18/23    Authorization Type [employer]    Authorization Time Period --   auth submitted   Authorization - Visit Number 10    Authorization - Number of Visits 12    Progress Note Due on Visit 20    PT Start Time 1102    PT Stop Time 1145    PT Time Calculation (min) 43 min    Equipment Utilized During Treatment Gait belt    Activity Tolerance Patient tolerated treatment well    Behavior During Therapy WFL for tasks assessed/performed             Past Medical History:  Diagnosis Date   Anemia    Arthritis    Bell's palsy    CAD in native artery 06/04/2020   RCA infarct 2013.  S/p RCA and OM PCI   Diabetes mellitus type 2 in nonobese (HCC) 06/04/2020   Diabetes mellitus without complication (HCC)    Essential hypertension 06/04/2020   Floaters in visual field    Hypertension    Myocardial infarction [facility])    Parkinson's disease (HCC)    Parkinson's disease (HCC) 06/04/2020   Pure hypercholesterolemia 06/04/2020   Past Surgical History:  Procedure Laterality Date   APPENDECTOMY     back epidural   08/2017   BACK SURGERY     CORONARY ANGIOPLASTY WITH STENT PLACEMENT  08/19/2012   KNEE SURGERY     TOTAL KNEE ARTHROPLASTY Left 04/21/2023   Procedure: LEFT TOTAL KNEE ARTHROPLASTY;  Surgeon: Kathryne Hitch, MD;  Location: [facility];  Service: Orthopedics;  Laterality: Left;   Patient Active Problem List   Diagnosis Date Noted   Status post total left knee replacement 04/21/2023   High risk medication use 10/28/2021   Olecranon bursitis, left elbow 04/21/2021   Personal history of COVID-19 04/13/2021   Primary  osteoarthritis, 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   Essential hypertension 06/04/2020   Pure hypercholesterolemia 06/04/2020   CAD in native artery 06/04/2020   Parkinson's disease (HCC) 06/04/2020   Diabetes mellitus type 2 in nonobese (HCC) 06/04/2020   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   Iron deficiency anemia 11/14/2019   Unilateral primary osteoarthritis, left knee 04/01/2019   Chronic left-sided low back pain with left-sided sciatica 04/01/2019   Lumbar radiculopathy 08/21/2017   Chronic pain syndrome 08/04/2017   Facet arthropathy 08/04/2017   Lumbar paraspinal muscle spasm 08/04/2017   Anemia of chronic disease 07/03/2017   Dizziness 11/10/2016   Hyperlipidemia, unspecified 10/27/2016   Other intervertebral disc degeneration, lumbar region 06/30/2016   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 heart with stable angina pectoris (HCC) 02/26/2016   SOB (shortness of breath) 02/26/2016   Old MI (myocardial infarction) 02/26/2016   OAB (overactive bladder) 09/05/2013   Ischemic heart disease 09/21/2012   Bell's palsy 06/14/2012    ONSET DATE: several years  REFERRING DIAG:  R26.81 (ICD-10-CM) - Unsteadiness on feet  M62.81 (ICD-10-CM) - Muscle weakness (generalized)  G20.B2 (ICD-10-CM) - Parkinson's disease with dyskinesia and fluctuating manifestations (HCC)  R29.3 (ICD-10-CM) - Abnormal posture    THERAPY DIAG:  Muscle weakness (generalized)  Unsteadiness on feet  Other abnormalities of gait and mobility  Other symptoms and signs involving the nervous system  Parkinson's disease with dyskinesia and fluctuating manifestations (HCC)  Abnormal posture  Right foot drop  Rationale for Evaluation and Treatment: Rehabilitation  SUBJECTIVE:                                                                                                                                                                                             SUBJECTIVE STATEMENT: The AFO  rubbed the shin uncomfortably (ground-reaction style). Back is feeling better   Pt accompanied by: self  PERTINENT HISTORY: CAD, DM, HTN, hx of back surgery; sciatica, L TKA 04/21/23  PAIN:  Are you having pain? Yes: NPRS scale: 5/10 Pain location: R LB Pain description: aching, shooting Aggravating factors: prolonged standing, lifting >25 lbs Relieving factors: taking some weight off  PRECAUTIONS: Fall  RED FLAGS: None   WEIGHT BEARING RESTRICTIONS: No  FALLS: Has patient fallen in last 6 months? Yes. Number of falls reports at least a dozen  LIVING ENVIRONMENT: Lives with: lives with their spouse Lives in: House/apartment Stairs:  2nd story with 6 steps upstairs and downstairs; 1 step to enter  Has following equipment at home: Single point cane, Walker - 2 wheeled, and upright walker, shower seat  PLOF: Independent  PATIENT GOALS: strengthening my legs and arms so I can lift things without falling   OBJECTIVE:   TODAY'S TREATMENT: 12/04/22 Activity Comments  Seated PWR moves 1x10 warm-up 1x10 w/ red resistance  Standing PWR moves 1x10 w/ red resistance   Recumbent cycling Attempted but right ankle weakness/lack of control limits foot on pedal  NU-step speed intervals x 8 min. Resistance level 4-6 2 min warm-up 30 sec sprint (110-135 SPM); 30 sec recovery           TODAY'S TREATMENT: 11/30/23 Activity Comments  Chair squats w/ front rack 3x10 7#  Modified deadlift 3x10 25#, 4" box--perfect hip hinge--strained back on final repetitions  Berg Balance Test 43/56  NU-step x 8 min Resistance 4, steady state 90+ SPM for conditioning                     PATIENT EDUCATION: Education details: progress note details Person educated: Patient Education method: Explanation, Demonstration, Tactile cues, Verbal cues, and Handouts Education comprehension: verbalized understanding and returned demonstration  HOME EXERCISE PROGRAM: Access Code: [email or web address] URL:  [email or web address] Date: 10/26/2023 Prepared by: Shary Decamp  Exercises - Sit to Stand in Stride with AFO and UE Assist in LE Alignment  - 1 x daily - 7 x weekly - 3-5  sets - 5 reps - Corner Balance Feet Together With Eyes Open  - 1 x daily - 7 x weekly - 1-3 sets - 30 sec hold - Corner Balance Feet Together With Eyes Closed  - 1 x daily - 7 x weekly - 1-3 sets - 30 sec hold - Corner Balance Feet Together: Eyes Open With Head Turns  - 1 x daily - 7 x weekly - 3 sets - 3 reps - Corner Balance Feet Together: Eyes Closed With Head Turns  - 1 x daily - 7 x weekly - 3 sets - 3 reps - Standing Gastroc Stretch  - 1 x daily - 7 x weekly - 3 sets - 60 sec hold  Note: Objective measures were completed at Evaluation unless otherwise noted.  DIAGNOSTIC FINDINGS: none recent  COGNITION: Overall cognitive status: Within functional limits for tasks assessed   SENSATION: Pt reports diminished sensation in B LEs but testing intact to light touch  COORDINATION: Alternating pronation/supination: very slightly slowed  Alternating toe tap: reduced ROM on R  Finger to nose: slight intention tremor B  MUSCLE TONE: slightly increased tone in R quad   POSTURE: L lean sitting in chair, flexed posture   LOWER EXTREMITY ROM:     Active  Right Eval Left Eval  Hip flexion    Hip extension    Hip abduction    Hip adduction    Hip internal rotation    Hip external rotation    Knee flexion  106  Knee extension  2  Ankle dorsiflexion -9 1  Ankle plantarflexion    Ankle inversion    Ankle eversion     (Blank rows = not tested)  LOWER EXTREMITY MMT:    MMT (in sitting) Right Eval Left Eval  Hip flexion 4+ 5  Hip extension    Hip abduction 4 4+  Hip adduction 4 4+  Hip internal rotation    Hip external rotation    Knee flexion 4+ 4 *c/o muscle spasm in L toes  Knee extension 4+ 4+  Ankle dorsiflexion 2 4-  Ankle plantarflexion 2+ 4  Ankle inversion    Ankle eversion     (Blank rows = not tested)     GAIT: Gait pattern: audible R foot slap, R LE slightly scissoring, hip instability and imbalance  Assistive device utilized: Single point cane Level of assistance: CGA   FUNCTIONAL TESTS:  65M walk: 18.03 sec (1.82 ft/sec) 5xSTS: 23.84 sec some retropulsion on 1st 2 reps                                                                                                                               TREATMENT DATE: 10/23/23      GOALS: Goals reviewed with patient? Yes  SHORT TERM GOALS: Target date: 11/20/2023  Patient to be independent with initial HEP. Baseline: HEP initiated Goal status: MET    LONG TERM GOALS: Target date: 12/18/2023  Patient to be independent with advanced HEP. Baseline: Not yet initiated  Goal status: IN PROGRESS  Patient to score at least 46/56 on Berg in order to decrease risk of falls.   Baseline: 43/56 Goal status: IN PROGRESS  Patient to demonstrate safe ambulation with mod I with LRAD on indoor and outdoor surfaces.  Baseline: currently unsteady with SPC indoors and requires CGA Goal status: IN PROGRESS  Patient to demonstrate 5xSTS test in <15 sec in order to decrease risk of falls.   Baseline: 5xSTS: 23.84 sec some retropulsion on 1st 2 reps  Goal status: IN PROGRESS  Patient to verbalize understanding of fall prevention in home environment information. Baseline: Not yet initiated Goal status: INITIAL  Patient to verbalize tips to reduce freezing/festination with gait and turns. Baseline: Not yet initiated  Goal status: INITIAL   ASSESSMENT:  CLINICAL IMPRESSION: Instructed in seated and standing large amplitude movements with addition of resistance for improved upper body strength/endurance/coordination with good performance throughout and verbalizes understanding for home performance. Ended session with HIIT cardio performing 30 sec intervals 1:1 for improved LE power, rapid alternating movement, and  general conditioning. Instructed in optimal format for multimodal training, e.g. power/strength movements first and then high intensity cardio and ending with steady-state cardio if desired.    OBJECTIVE IMPAIRMENTS: Abnormal gait, decreased balance, decreased coordination, difficulty walking, decreased ROM, decreased strength, decreased safety awareness, impaired flexibility, impaired tone, improper body mechanics, postural dysfunction, and pain.   ACTIVITY LIMITATIONS: carrying, lifting, bending, sitting, standing, squatting, sleeping, stairs, transfers, bed mobility, bathing, toileting, dressing, reach over head, hygiene/grooming, and locomotion level  PARTICIPATION LIMITATIONS: meal prep, cleaning, laundry, shopping, community activity, and church  PERSONAL FACTORS: Age, Past/current experiences, Time since onset of injury/illness/exacerbation, and 3+ comorbidities: CAD, DM, HTN, hx of back surgery; sciatica, L TKA 04/21/23  are also affecting patient's functional outcome.   REHAB POTENTIAL: Good  CLINICAL DECISION MAKING: Evolving/moderate complexity  EVALUATION COMPLEXITY: Moderate  PLAN:  PT FREQUENCY: 2x/week  PT DURATION: 8 weeks  PLANNED INTERVENTIONS: 97164- PT Re-evaluation, 97110-Therapeutic exercises, 97530- Therapeutic activity, 97112- Neuromuscular re-education, 97535- Self Care, 19147- Manual therapy, [phone]- Gait training, [phone]- Orthotic Fit/training, Patient/Family education, Balance training, Stair training, Taping, Dry Needling, and DME instructions  PLAN FOR NEXT SESSION: work on maintaining stability when lifting; balance training   11:05 AM, 12/04/23 M. Shary Decamp, PT, DPT Physical Therapist- [place] Office Number: [phone]

## 2023-12-07 ENCOUNTER — Ambulatory Visit: Payer: Medicare HMO

## 2023-12-07 DIAGNOSIS — G20B2 Parkinson's disease with dyskinesia, with fluctuations: Secondary | ICD-10-CM

## 2023-12-07 DIAGNOSIS — R293 Abnormal posture: Secondary | ICD-10-CM

## 2023-12-07 DIAGNOSIS — M21371 Foot drop, right foot: Secondary | ICD-10-CM

## 2023-12-07 DIAGNOSIS — R2689 Other abnormalities of gait and mobility: Secondary | ICD-10-CM

## 2023-12-07 DIAGNOSIS — M6281 Muscle weakness (generalized): Secondary | ICD-10-CM | POA: Diagnosis not present

## 2023-12-07 DIAGNOSIS — R2681 Unsteadiness on feet: Secondary | ICD-10-CM

## 2023-12-07 DIAGNOSIS — R29818 Other symptoms and signs involving the nervous system: Secondary | ICD-10-CM

## 2023-12-07 NOTE — Therapy (Signed)
 OUTPATIENT PHYSICAL THERAPY NEURO TREATMENT   Patient Name: Ronald [name] MRN: [id number] DOB:01/18/1951, 73 y.o., male Today's Date: 12/07/2023   PCP: Woodroe Chen, MD REFERRING PROVIDER: Huston Foley, MD   END OF SESSION:  PT End of Session - 12/07/23 1103     Visit Number 12    Number of Visits 17    Date for PT Re-Evaluation 12/18/23    Authorization Type [employer]    Authorization Time Period --   auth submitted   Authorization - Number of Visits 13    Progress Note Due on Visit 20    PT Start Time 1102    PT Stop Time 1145    PT Time Calculation (min) 43 min    Equipment Utilized During Treatment Gait belt    Activity Tolerance Patient tolerated treatment well    Behavior During Therapy WFL for tasks assessed/performed             Past Medical History:  Diagnosis Date   Anemia    Arthritis    Bell's palsy    CAD in native artery 06/04/2020   RCA infarct 2013.  S/p RCA and OM PCI   Diabetes mellitus type 2 in nonobese (HCC) 06/04/2020   Diabetes mellitus without complication (HCC)    Essential hypertension 06/04/2020   Floaters in visual field    Hypertension    Myocardial infarction [facility])    Parkinson's disease (HCC)    Parkinson's disease (HCC) 06/04/2020   Pure hypercholesterolemia 06/04/2020   Past Surgical History:  Procedure Laterality Date   APPENDECTOMY     back epidural   08/2017   BACK SURGERY     CORONARY ANGIOPLASTY WITH STENT PLACEMENT  08/19/2012   KNEE SURGERY     TOTAL KNEE ARTHROPLASTY Left 04/21/2023   Procedure: LEFT TOTAL KNEE ARTHROPLASTY;  Surgeon: Kathryne Hitch, MD;  Location: [facility];  Service: Orthopedics;  Laterality: Left;   Patient Active Problem List   Diagnosis Date Noted   Status post total left knee replacement 04/21/2023   High risk medication use 10/28/2021   Olecranon bursitis, left elbow 04/21/2021   Personal history of COVID-19 04/13/2021   Primary osteoarthritis, 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    Essential hypertension 06/04/2020   Pure hypercholesterolemia 06/04/2020   CAD in native artery 06/04/2020   Parkinson's disease (HCC) 06/04/2020   Diabetes mellitus type 2 in nonobese (HCC) 06/04/2020   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   Iron deficiency anemia 11/14/2019   Unilateral primary osteoarthritis, left knee 04/01/2019   Chronic left-sided low back pain with left-sided sciatica 04/01/2019   Lumbar radiculopathy 08/21/2017   Chronic pain syndrome 08/04/2017   Facet arthropathy 08/04/2017   Lumbar paraspinal muscle spasm 08/04/2017   Anemia of chronic disease 07/03/2017   Dizziness 11/10/2016   Hyperlipidemia, unspecified 10/27/2016   Other intervertebral disc degeneration, lumbar region 06/30/2016   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 heart with stable angina pectoris (HCC) 02/26/2016   SOB (shortness of breath) 02/26/2016   Old MI (myocardial infarction) 02/26/2016   OAB (overactive bladder) 09/05/2013   Ischemic heart disease 09/21/2012   Bell's palsy 06/14/2012    ONSET DATE: several years  REFERRING DIAG:  R26.81 (ICD-10-CM) - Unsteadiness on feet  M62.81 (ICD-10-CM) - Muscle weakness (generalized)  G20.B2 (ICD-10-CM) - Parkinson's disease with dyskinesia and fluctuating manifestations (HCC)  R29.3 (ICD-10-CM) - Abnormal posture    THERAPY DIAG:  Muscle weakness (generalized)  Unsteadiness on feet  Other abnormalities of gait  and mobility  Other symptoms and signs involving the nervous system  Parkinson's disease with dyskinesia and fluctuating manifestations (HCC)  Abnormal posture  Right foot drop  Rationale for Evaluation and Treatment: Rehabilitation  SUBJECTIVE:                                                                                                                                                                                             SUBJECTIVE STATEMENT: Doing fine, no new issues   Pt accompanied by:  self  PERTINENT HISTORY: CAD, DM, HTN, hx of back surgery; sciatica, L TKA 04/21/23  PAIN:  Are you having pain? Yes: NPRS scale: 5/10 Pain location: R LB Pain description: aching, shooting Aggravating factors: prolonged standing, lifting >25 lbs Relieving factors: taking some weight off  PRECAUTIONS: Fall  RED FLAGS: None   WEIGHT BEARING RESTRICTIONS: No  FALLS: Has patient fallen in last 6 months? Yes. Number of falls reports at least a dozen  LIVING ENVIRONMENT: Lives with: lives with their spouse Lives in: House/apartment Stairs:  2nd story with 6 steps upstairs and downstairs; 1 step to enter  Has following equipment at home: Single point cane, Walker - 2 wheeled, and upright walker, shower seat  PLOF: Independent  PATIENT GOALS: strengthening my legs and arms so I can lift things without falling   OBJECTIVE:   TODAY'S TREATMENT: 12/07/23 Activity Comments  Sidestepping x 2 min 4#  Alt march 3x10 4# decreased height due to LBP  LAQ 3x10 4#  Multisensory balance            TODAY'S TREATMENT: 12/04/22 Activity Comments  Seated PWR moves 1x10 warm-up 1x10 w/ red resistance  Standing PWR moves 1x10 w/ red resistance   Recumbent cycling Attempted but right ankle weakness/lack of control limits foot on pedal  NU-step speed intervals x 8 min. Resistance level 4-6 2 min warm-up 30 sec sprint (110-135 SPM); 30 sec recovery                       PATIENT EDUCATION: Education details: progress note details Person educated: Patient Education method: Explanation, Demonstration, Tactile cues, Verbal cues, and Handouts Education comprehension: verbalized understanding and returned demonstration  HOME EXERCISE PROGRAM: Access Code: [email or web address] URL: [email or web address] Date: 10/26/2023 Prepared by: Shary Decamp  Exercises - Sit to Stand in Stride with AFO and UE Assist in LE Alignment  - 1 x daily - 7 x weekly - 3-5 sets - 5 reps - Corner  Balance Feet Together With Eyes Open  - 1 x daily - 7 x weekly - 1-3 sets - 30 sec hold -  Corner Balance Feet Together With Eyes Closed  - 1 x daily - 7 x weekly - 1-3 sets - 30 sec hold - Corner Balance Feet Together: Eyes Open With Head Turns  - 1 x daily - 7 x weekly - 3 sets - 3 reps - Corner Balance Feet Together: Eyes Closed With Head Turns  - 1 x daily - 7 x weekly - 3 sets - 3 reps - Standing Gastroc Stretch  - 1 x daily - 7 x weekly - 3 sets - 60 sec hold  Note: Objective measures were completed at Evaluation unless otherwise noted.  DIAGNOSTIC FINDINGS: none recent  COGNITION: Overall cognitive status: Within functional limits for tasks assessed   SENSATION: Pt reports diminished sensation in B LEs but testing intact to light touch  COORDINATION: Alternating pronation/supination: very slightly slowed  Alternating toe tap: reduced ROM on R  Finger to nose: slight intention tremor B  MUSCLE TONE: slightly increased tone in R quad   POSTURE: L lean sitting in chair, flexed posture   LOWER EXTREMITY ROM:     Active  Right Eval Left Eval  Hip flexion    Hip extension    Hip abduction    Hip adduction    Hip internal rotation    Hip external rotation    Knee flexion  106  Knee extension  2  Ankle dorsiflexion -9 1  Ankle plantarflexion    Ankle inversion    Ankle eversion     (Blank rows = not tested)  LOWER EXTREMITY MMT:    MMT (in sitting) Right Eval Left Eval  Hip flexion 4+ 5  Hip extension    Hip abduction 4 4+  Hip adduction 4 4+  Hip internal rotation    Hip external rotation    Knee flexion 4+ 4 *c/o muscle spasm in L toes  Knee extension 4+ 4+  Ankle dorsiflexion 2 4-  Ankle plantarflexion 2+ 4  Ankle inversion    Ankle eversion    (Blank rows = not tested)     GAIT: Gait pattern: audible R foot slap, R LE slightly scissoring, hip instability and imbalance  Assistive device utilized: Single point cane Level of assistance:  CGA   FUNCTIONAL TESTS:  55M walk: 18.03 sec (1.82 ft/sec) 5xSTS: 23.84 sec some retropulsion on 1st 2 reps                                                                                                                               TREATMENT DATE: 10/23/23      GOALS: Goals reviewed with patient? Yes  SHORT TERM GOALS: Target date: 11/20/2023  Patient to be independent with initial HEP. Baseline: HEP initiated Goal status: MET    LONG TERM GOALS: Target date: 12/18/2023  Patient to be independent with advanced HEP. Baseline: Not yet initiated  Goal status: IN PROGRESS  Patient to score at least 46/56 on Berg in order to  decrease risk of falls.   Baseline: 43/56 Goal status: IN PROGRESS  Patient to demonstrate safe ambulation with mod I with LRAD on indoor and outdoor surfaces.  Baseline: currently unsteady with SPC indoors and requires CGA Goal status: IN PROGRESS  Patient to demonstrate 5xSTS test in <15 sec in order to decrease risk of falls.   Baseline: 5xSTS: 23.84 sec some retropulsion on 1st 2 reps  Goal status: IN PROGRESS  Patient to verbalize understanding of fall prevention in home environment information. Baseline: Not yet initiated Goal status: INITIAL  Patient to verbalize tips to reduce freezing/festination with gait and turns. Baseline: Not yet initiated  Goal status: INITIAL   ASSESSMENT:  CLINICAL IMPRESSION: Activities to improve single limb support and ability for negotiating obstacles to improve step height to reduce risk for tripping/falls w/ ability to progress from BUE support to light single UE support with progressions.  Improved ability with condition 4 M-CTSIB with mild-mod sway x 30 sec with 1 instance retro-LOB with tactile cues to correct.  Difficulty with demands of ankle strategy likely due in part to RLE neuropathy.  Continued sessions to progress more dynamic balance training with emphasis on maintaining balance with  lifting/carrying from ground level.    OBJECTIVE IMPAIRMENTS: Abnormal gait, decreased balance, decreased coordination, difficulty walking, decreased ROM, decreased strength, decreased safety awareness, impaired flexibility, impaired tone, improper body mechanics, postural dysfunction, and pain.   ACTIVITY LIMITATIONS: carrying, lifting, bending, sitting, standing, squatting, sleeping, stairs, transfers, bed mobility, bathing, toileting, dressing, reach over head, hygiene/grooming, and locomotion level  PARTICIPATION LIMITATIONS: meal prep, cleaning, laundry, shopping, community activity, and church  PERSONAL FACTORS: Age, Past/current experiences, Time since onset of injury/illness/exacerbation, and 3+ comorbidities: CAD, DM, HTN, hx of back surgery; sciatica, L TKA 04/21/23  are also affecting patient's functional outcome.   REHAB POTENTIAL: Good  CLINICAL DECISION MAKING: Evolving/moderate complexity  EVALUATION COMPLEXITY: Moderate  PLAN:  PT FREQUENCY: 2x/week  PT DURATION: 8 weeks  PLANNED INTERVENTIONS: 97164- PT Re-evaluation, 97110-Therapeutic exercises, 97530- Therapeutic activity, 97112- Neuromuscular re-education, 97535- Self Care, 65784- Manual therapy, [phone]- Gait training, [phone]- Orthotic Fit/training, Patient/Family education, Balance training, Stair training, Taping, Dry Needling, and DME instructions  PLAN FOR NEXT SESSION: work on maintaining stability when lifting; balance training   11:03 AM, 12/07/23 M. Shary Decamp, PT, DPT Physical Therapist- [place] Office Number: [phone]

## 2023-12-11 ENCOUNTER — Ambulatory Visit: Payer: Medicare HMO

## 2023-12-11 DIAGNOSIS — M6281 Muscle weakness (generalized): Secondary | ICD-10-CM | POA: Diagnosis not present

## 2023-12-11 DIAGNOSIS — R2681 Unsteadiness on feet: Secondary | ICD-10-CM

## 2023-12-11 DIAGNOSIS — M21371 Foot drop, right foot: Secondary | ICD-10-CM

## 2023-12-11 DIAGNOSIS — R293 Abnormal posture: Secondary | ICD-10-CM

## 2023-12-11 DIAGNOSIS — R2689 Other abnormalities of gait and mobility: Secondary | ICD-10-CM

## 2023-12-11 DIAGNOSIS — G20B2 Parkinson's disease with dyskinesia, with fluctuations: Secondary | ICD-10-CM

## 2023-12-11 DIAGNOSIS — R29818 Other symptoms and signs involving the nervous system: Secondary | ICD-10-CM

## 2023-12-11 NOTE — Therapy (Signed)
 OUTPATIENT PHYSICAL THERAPY NEURO TREATMENT   Patient Name: Ronald [name] MRN: [id number] DOB:1951-06-20, 73 y.o., male Today's Date: 12/11/2023   PCP: Woodroe Chen, MD REFERRING PROVIDER: Huston Foley, MD   END OF SESSION:  PT End of Session - 12/11/23 1104     Visit Number 13    Number of Visits 17    Date for PT Re-Evaluation 12/18/23    Authorization Type [employer]    Authorization Time Period --   auth submitted   Authorization - Number of Visits 14    Progress Note Due on Visit 20    PT Start Time 1103    PT Stop Time 1145    PT Time Calculation (min) 42 min    Equipment Utilized During Treatment Gait belt    Activity Tolerance Patient tolerated treatment well    Behavior During Therapy WFL for tasks assessed/performed             Past Medical History:  Diagnosis Date   Anemia    Arthritis    Bell's palsy    CAD in native artery 06/04/2020   RCA infarct 2013.  S/p RCA and OM PCI   Diabetes mellitus type 2 in nonobese (HCC) 06/04/2020   Diabetes mellitus without complication (HCC)    Essential hypertension 06/04/2020   Floaters in visual field    Hypertension    Myocardial infarction [facility])    Parkinson's disease (HCC)    Parkinson's disease (HCC) 06/04/2020   Pure hypercholesterolemia 06/04/2020   Past Surgical History:  Procedure Laterality Date   APPENDECTOMY     back epidural   08/2017   BACK SURGERY     CORONARY ANGIOPLASTY WITH STENT PLACEMENT  08/19/2012   KNEE SURGERY     TOTAL KNEE ARTHROPLASTY Left 04/21/2023   Procedure: LEFT TOTAL KNEE ARTHROPLASTY;  Surgeon: Kathryne Hitch, MD;  Location: [facility];  Service: Orthopedics;  Laterality: Left;   Patient Active Problem List   Diagnosis Date Noted   Status post total left knee replacement 04/21/2023   High risk medication use 10/28/2021   Olecranon bursitis, left elbow 04/21/2021   Personal history of COVID-19 04/13/2021   Primary osteoarthritis, 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    Essential hypertension 06/04/2020   Pure hypercholesterolemia 06/04/2020   CAD in native artery 06/04/2020   Parkinson's disease (HCC) 06/04/2020   Diabetes mellitus type 2 in nonobese (HCC) 06/04/2020   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   Iron deficiency anemia 11/14/2019   Unilateral primary osteoarthritis, left knee 04/01/2019   Chronic left-sided low back pain with left-sided sciatica 04/01/2019   Lumbar radiculopathy 08/21/2017   Chronic pain syndrome 08/04/2017   Facet arthropathy 08/04/2017   Lumbar paraspinal muscle spasm 08/04/2017   Anemia of chronic disease 07/03/2017   Dizziness 11/10/2016   Hyperlipidemia, unspecified 10/27/2016   Other intervertebral disc degeneration, lumbar region 06/30/2016   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 heart with stable angina pectoris (HCC) 02/26/2016   SOB (shortness of breath) 02/26/2016   Old MI (myocardial infarction) 02/26/2016   OAB (overactive bladder) 09/05/2013   Ischemic heart disease 09/21/2012   Bell's palsy 06/14/2012    ONSET DATE: several years  REFERRING DIAG:  R26.81 (ICD-10-CM) - Unsteadiness on feet  M62.81 (ICD-10-CM) - Muscle weakness (generalized)  G20.B2 (ICD-10-CM) - Parkinson's disease with dyskinesia and fluctuating manifestations (HCC)  R29.3 (ICD-10-CM) - Abnormal posture    THERAPY DIAG:  Muscle weakness (generalized)  Unsteadiness on feet  Other abnormalities of gait  and mobility  Other symptoms and signs involving the nervous system  Parkinson's disease with dyskinesia and fluctuating manifestations (HCC)  Abnormal posture  Right foot drop  Rationale for Evaluation and Treatment: Rehabilitation  SUBJECTIVE:                                                                                                                                                                                             SUBJECTIVE STATEMENT: Had a fall on Friday when the right foot got caught  and tripped with fall forward, no injury sustained   Pt accompanied by: self  PERTINENT HISTORY: CAD, DM, HTN, hx of back surgery; sciatica, L TKA 04/21/23  PAIN:  Are you having pain? Yes: NPRS scale: 5/10 Pain location: R LB Pain description: aching, shooting Aggravating factors: prolonged standing, lifting >25 lbs Relieving factors: taking some weight off  PRECAUTIONS: Fall  RED FLAGS: None   WEIGHT BEARING RESTRICTIONS: No  FALLS: Has patient fallen in last 6 months? Yes. Number of falls reports at least a dozen  LIVING ENVIRONMENT: Lives with: lives with their spouse Lives in: House/apartment Stairs:  2nd story with 6 steps upstairs and downstairs; 1 step to enter  Has following equipment at home: Single point cane, Walker - 2 wheeled, and upright walker, shower seat  PLOF: Independent  PATIENT GOALS: strengthening my legs and arms so I can lift things without falling   OBJECTIVE:   TODAY'S TREATMENT: 12/11/23 Activity Comments  Suitcase deadlift 4x5 reps 15# KB on 4" step--right lateral LOB with weight on right  STS 3x5 15# front hold KB, 22" seat height  Push-release testing All directions  Multisensory balance  Difficulty with eyes closed conditions. -standing on incline 2x30 sec eyes open/closed -4square stepping 3x clockwise/counter.  Greater difficulty with leading right (clockwise)  Freezing of gait strategies                    PATIENT EDUCATION: Education details: progress note details Person educated: Patient Education method: Explanation, Demonstration, Tactile cues, Verbal cues, and Handouts Education comprehension: verbalized understanding and returned demonstration  HOME EXERCISE PROGRAM: Access Code: [email or web address] URL: [email or web address] Date: 10/26/2023 Prepared by: Shary Decamp  Exercises - Sit to Stand in Stride with AFO and UE Assist in LE Alignment  - 1 x daily - 7 x weekly - 3-5 sets - 5 reps - Corner Balance  Feet Together With Eyes Open  - 1 x daily - 7 x weekly - 1-3 sets - 30 sec hold - Corner Balance Feet Together With Eyes Closed  - 1 x daily - 7 x weekly - 1-3  sets - 30 sec hold - Corner Balance Feet Together: Eyes Open With Head Turns  - 1 x daily - 7 x weekly - 3 sets - 3 reps - Corner Balance Feet Together: Eyes Closed With Head Turns  - 1 x daily - 7 x weekly - 3 sets - 3 reps - Standing Gastroc Stretch  - 1 x daily - 7 x weekly - 3 sets - 60 sec hold  Note: Objective measures were completed at Evaluation unless otherwise noted.  DIAGNOSTIC FINDINGS: none recent  COGNITION: Overall cognitive status: Within functional limits for tasks assessed   SENSATION: Pt reports diminished sensation in B LEs but testing intact to light touch  COORDINATION: Alternating pronation/supination: very slightly slowed  Alternating toe tap: reduced ROM on R  Finger to nose: slight intention tremor B  MUSCLE TONE: slightly increased tone in R quad   POSTURE: L lean sitting in chair, flexed posture   LOWER EXTREMITY ROM:     Active  Right Eval Left Eval  Hip flexion    Hip extension    Hip abduction    Hip adduction    Hip internal rotation    Hip external rotation    Knee flexion  106  Knee extension  2  Ankle dorsiflexion -9 1  Ankle plantarflexion    Ankle inversion    Ankle eversion     (Blank rows = not tested)  LOWER EXTREMITY MMT:    MMT (in sitting) Right Eval Left Eval  Hip flexion 4+ 5  Hip extension    Hip abduction 4 4+  Hip adduction 4 4+  Hip internal rotation    Hip external rotation    Knee flexion 4+ 4 *c/o muscle spasm in L toes  Knee extension 4+ 4+  Ankle dorsiflexion 2 4-  Ankle plantarflexion 2+ 4  Ankle inversion    Ankle eversion    (Blank rows = not tested)     GAIT: Gait pattern: audible R foot slap, R LE slightly scissoring, hip instability and imbalance  Assistive device utilized: Single point cane Level of assistance: CGA   FUNCTIONAL  TESTS:  47M walk: 18.03 sec (1.82 ft/sec) 5xSTS: 23.84 sec some retropulsion on 1st 2 reps                                                                                                                               TREATMENT DATE: 10/23/23      GOALS: Goals reviewed with patient? Yes  SHORT TERM GOALS: Target date: 11/20/2023  Patient to be independent with initial HEP. Baseline: HEP initiated Goal status: MET    LONG TERM GOALS: Target date: 12/18/2023  Patient to be independent with advanced HEP. Baseline: Not yet initiated  Goal status: IN PROGRESS  Patient to score at least 46/56 on Berg in order to decrease risk of falls.   Baseline: 43/56 Goal status: IN PROGRESS  Patient to demonstrate safe ambulation  with mod I with LRAD on indoor and outdoor surfaces.  Baseline: currently unsteady with SPC indoors and requires CGA Goal status: IN PROGRESS  Patient to demonstrate 5xSTS test in <15 sec in order to decrease risk of falls.   Baseline: 5xSTS: 23.84 sec some retropulsion on 1st 2 reps  Goal status: IN PROGRESS  Patient to verbalize understanding of fall prevention in home environment information. Baseline: Not yet initiated Goal status: INITIAL  Patient to verbalize tips to reduce freezing/festination with gait and turns. Baseline: Not yet initiated  Goal status: INITIAL   ASSESSMENT:  CLINICAL IMPRESSION: Continued with practice for lifting from ground level to improve mobility and reduce tendency for LOB with emphasis on unilateral loading to replicate these demands with tendency for right lateral LOB when resistance applied to ipsilat side, presumably from right side weakness from lumbar spine issues.  Push-release testing with good single step amplitude for anterior LOB with about 50% success to lateral direction and non-functional stepping with posterior LOB.  Multisensory balance training to improve static stability/postural awareness with increased difficulty  under eyes closed conditions with pt reporting internal feeling of dizziness/limited awareness. Additional balance activities to improve single limb support with greatest issue being RLE stability.  Review freezing of gait strategies for negotiating turns.  Continued sessions to advance POC details to improve mobility and reduce risk for falls   OBJECTIVE IMPAIRMENTS: Abnormal gait, decreased balance, decreased coordination, difficulty walking, decreased ROM, decreased strength, decreased safety awareness, impaired flexibility, impaired tone, improper body mechanics, postural dysfunction, and pain.   ACTIVITY LIMITATIONS: carrying, lifting, bending, sitting, standing, squatting, sleeping, stairs, transfers, bed mobility, bathing, toileting, dressing, reach over head, hygiene/grooming, and locomotion level  PARTICIPATION LIMITATIONS: meal prep, cleaning, laundry, shopping, community activity, and church  PERSONAL FACTORS: Age, Past/current experiences, Time since onset of injury/illness/exacerbation, and 3+ comorbidities: CAD, DM, HTN, hx of back surgery; sciatica, L TKA 04/21/23  are also affecting patient's functional outcome.   REHAB POTENTIAL: Good  CLINICAL DECISION MAKING: Evolving/moderate complexity  EVALUATION COMPLEXITY: Moderate  PLAN:  PT FREQUENCY: 2x/week  PT DURATION: 8 weeks  PLANNED INTERVENTIONS: 97164- PT Re-evaluation, 97110-Therapeutic exercises, 97530- Therapeutic activity, 97112- Neuromuscular re-education, 97535- Self Care, 16109- Manual therapy, [phone]- Gait training, [phone]- Orthotic Fit/training, Patient/Family education, Balance training, Stair training, Taping, Dry Needling, and DME instructions  PLAN FOR NEXT SESSION: work on maintaining stability when lifting; balance training   11:05 AM, 12/11/23 M. Shary Decamp, PT, DPT Physical Therapist- [place] Office Number: [phone]

## 2023-12-14 ENCOUNTER — Ambulatory Visit: Payer: Medicare HMO

## 2023-12-14 DIAGNOSIS — R293 Abnormal posture: Secondary | ICD-10-CM

## 2023-12-14 DIAGNOSIS — M21371 Foot drop, right foot: Secondary | ICD-10-CM

## 2023-12-14 DIAGNOSIS — M6281 Muscle weakness (generalized): Secondary | ICD-10-CM | POA: Diagnosis not present

## 2023-12-14 DIAGNOSIS — R29818 Other symptoms and signs involving the nervous system: Secondary | ICD-10-CM

## 2023-12-14 DIAGNOSIS — R2689 Other abnormalities of gait and mobility: Secondary | ICD-10-CM

## 2023-12-14 DIAGNOSIS — R2681 Unsteadiness on feet: Secondary | ICD-10-CM

## 2023-12-14 DIAGNOSIS — G20B2 Parkinson's disease with dyskinesia, with fluctuations: Secondary | ICD-10-CM

## 2023-12-14 NOTE — Therapy (Signed)
 OUTPATIENT PHYSICAL THERAPY NEURO TREATMENT   Patient Name: Ronald [name] MRN: [id number] DOB:03/05/1951, 73 y.o., male Today's Date: 12/14/2023   PCP: Woodroe Chen, MD REFERRING PROVIDER: Huston Foley, MD   END OF SESSION:  PT End of Session - 12/14/23 1105     Visit Number 14    Number of Visits 17    Date for PT Re-Evaluation 12/18/23    Authorization Type [employer]    Authorization Time Period --   auth submitted   Authorization - Visit Number 11    Authorization - Number of Visits 14    Progress Note Due on Visit 20    PT Start Time 1100    PT Stop Time 1145    PT Time Calculation (min) 45 min    Equipment Utilized During Treatment Gait belt    Activity Tolerance Patient tolerated treatment well    Behavior During Therapy WFL for tasks assessed/performed             Past Medical History:  Diagnosis Date   Anemia    Arthritis    Bell's palsy    CAD in native artery 06/04/2020   RCA infarct 2013.  S/p RCA and OM PCI   Diabetes mellitus type 2 in nonobese (HCC) 06/04/2020   Diabetes mellitus without complication (HCC)    Essential hypertension 06/04/2020   Floaters in visual field    Hypertension    Myocardial infarction [facility])    Parkinson's disease (HCC)    Parkinson's disease (HCC) 06/04/2020   Pure hypercholesterolemia 06/04/2020   Past Surgical History:  Procedure Laterality Date   APPENDECTOMY     back epidural   08/2017   BACK SURGERY     CORONARY ANGIOPLASTY WITH STENT PLACEMENT  08/19/2012   KNEE SURGERY     TOTAL KNEE ARTHROPLASTY Left 04/21/2023   Procedure: LEFT TOTAL KNEE ARTHROPLASTY;  Surgeon: Kathryne Hitch, MD;  Location: [facility];  Service: Orthopedics;  Laterality: Left;   Patient Active Problem List   Diagnosis Date Noted   Status post total left knee replacement 04/21/2023   High risk medication use 10/28/2021   Olecranon bursitis, left elbow 04/21/2021   Personal history of COVID-19 04/13/2021   Primary  osteoarthritis, 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   Essential hypertension 06/04/2020   Pure hypercholesterolemia 06/04/2020   CAD in native artery 06/04/2020   Parkinson's disease (HCC) 06/04/2020   Diabetes mellitus type 2 in nonobese (HCC) 06/04/2020   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   Iron deficiency anemia 11/14/2019   Unilateral primary osteoarthritis, left knee 04/01/2019   Chronic left-sided low back pain with left-sided sciatica 04/01/2019   Lumbar radiculopathy 08/21/2017   Chronic pain syndrome 08/04/2017   Facet arthropathy 08/04/2017   Lumbar paraspinal muscle spasm 08/04/2017   Anemia of chronic disease 07/03/2017   Dizziness 11/10/2016   Hyperlipidemia, unspecified 10/27/2016   Other intervertebral disc degeneration, lumbar region 06/30/2016   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 heart with stable angina pectoris (HCC) 02/26/2016   SOB (shortness of breath) 02/26/2016   Old MI (myocardial infarction) 02/26/2016   OAB (overactive bladder) 09/05/2013   Ischemic heart disease 09/21/2012   Bell's palsy 06/14/2012    ONSET DATE: several years  REFERRING DIAG:  R26.81 (ICD-10-CM) - Unsteadiness on feet  M62.81 (ICD-10-CM) - Muscle weakness (generalized)  G20.B2 (ICD-10-CM) - Parkinson's disease with dyskinesia and fluctuating manifestations (HCC)  R29.3 (ICD-10-CM) - Abnormal posture    THERAPY DIAG:  Muscle weakness (generalized)  Unsteadiness on feet  Other abnormalities of gait and mobility  Other symptoms and signs involving the nervous system  Parkinson's disease with dyskinesia and fluctuating manifestations (HCC)  Abnormal posture  Right foot drop  Rationale for Evaluation and Treatment: Rehabilitation  SUBJECTIVE:                                                                                                                                                                                             SUBJECTIVE STATEMENT: Got the AFO  but not the one I was expecting (ground-reaction AFO) and its rubbed a sore on front of shin   Pt accompanied by: self  PERTINENT HISTORY: CAD, DM, HTN, hx of back surgery; sciatica, L TKA 04/21/23  PAIN:  Are you having pain? Yes: NPRS scale: 5/10 Pain location: R LB Pain description: aching, shooting Aggravating factors: prolonged standing, lifting >25 lbs Relieving factors: taking some weight off  PRECAUTIONS: Fall  RED FLAGS: None   WEIGHT BEARING RESTRICTIONS: No  FALLS: Has patient fallen in last 6 months? Yes. Number of falls reports at least a dozen  LIVING ENVIRONMENT: Lives with: lives with their spouse Lives in: House/apartment Stairs:  2nd story with 6 steps upstairs and downstairs; 1 step to enter  Has following equipment at home: Single point cane, Walker - 2 wheeled, and upright walker, shower seat  PLOF: Independent  PATIENT GOALS: strengthening my legs and arms so I can lift things without falling   OBJECTIVE:   TODAY'S TREATMENT: 12/14/23 Activity Comments  Modified deadlift 1x10 15# -traditional -suitcase-stride stance  Standing paloff 2x10 + 10 sec last rep hold  10#  HEP review 100% recall  Pivot turns 1x10 -Large amplitude transferring cones from counter to counter -lateral cross and reach with red t-band resistance  Gait training Pt wearing his recently acquired ground reaction AFO on right but unable to tolerate fit due to anterior plate rubbing and causing discomfort for proper fit. Trials with posterior leaf spring AFO with improved foot clearance and some continued Trendelenberg from hip but maintaining adequate foot clearance for swing. Trials in stepping over and around obstacles with CGA for facilitating tight change of direction        TODAY'S TREATMENT: 12/11/23 Activity Comments  Suitcase deadlift 4x5 reps 15# KB on 4" step--right lateral LOB with weight on right  STS 3x5 15# front hold KB, 22" seat height  Push-release testing All  directions  Multisensory balance  Difficulty with eyes closed conditions. -standing on incline 2x30 sec eyes open/closed -4square stepping 3x clockwise/counter.  Greater difficulty with leading right (clockwise)  Freezing of gait strategies  PATIENT EDUCATION: Education details: progress note details Person educated: Patient Education method: Explanation, Demonstration, Tactile cues, Verbal cues, and Handouts Education comprehension: verbalized understanding and returned demonstration  HOME EXERCISE PROGRAM: Access Code: [email or web address] URL: [email or web address] Date: 10/26/2023 Prepared by: Shary Decamp  Exercises - Sit to Stand in Stride with AFO and UE Assist in LE Alignment  - 1 x daily - 7 x weekly - 3-5 sets - 5 reps - Corner Balance Feet Together With Eyes Open  - 1 x daily - 7 x weekly - 1-3 sets - 30 sec hold - Corner Balance Feet Together With Eyes Closed  - 1 x daily - 7 x weekly - 1-3 sets - 30 sec hold - Corner Balance Feet Together: Eyes Open With Head Turns  - 1 x daily - 7 x weekly - 3 sets - 3 reps - Corner Balance Feet Together: Eyes Closed With Head Turns  - 1 x daily - 7 x weekly - 3 sets - 3 reps - Standing Gastroc Stretch  - 1 x daily - 7 x weekly - 3 sets - 60 sec hold  Note: Objective measures were completed at Evaluation unless otherwise noted.  DIAGNOSTIC FINDINGS: none recent  COGNITION: Overall cognitive status: Within functional limits for tasks assessed   SENSATION: Pt reports diminished sensation in B LEs but testing intact to light touch  COORDINATION: Alternating pronation/supination: very slightly slowed  Alternating toe tap: reduced ROM on R  Finger to nose: slight intention tremor B  MUSCLE TONE: slightly increased tone in R quad   POSTURE: L lean sitting in chair, flexed posture   LOWER EXTREMITY ROM:     Active  Right Eval Left Eval  Hip flexion    Hip extension    Hip abduction    Hip  adduction    Hip internal rotation    Hip external rotation    Knee flexion  106  Knee extension  2  Ankle dorsiflexion -9 1  Ankle plantarflexion    Ankle inversion    Ankle eversion     (Blank rows = not tested)  LOWER EXTREMITY MMT:    MMT (in sitting) Right Eval Left Eval  Hip flexion 4+ 5  Hip extension    Hip abduction 4 4+  Hip adduction 4 4+  Hip internal rotation    Hip external rotation    Knee flexion 4+ 4 *c/o muscle spasm in L toes  Knee extension 4+ 4+  Ankle dorsiflexion 2 4-  Ankle plantarflexion 2+ 4  Ankle inversion    Ankle eversion    (Blank rows = not tested)     GAIT: Gait pattern: audible R foot slap, R LE slightly scissoring, hip instability and imbalance  Assistive device utilized: Single point cane Level of assistance: CGA   FUNCTIONAL TESTS:  12M walk: 18.03 sec (1.82 ft/sec) 5xSTS: 23.84 sec some retropulsion on 1st 2 reps  TREATMENT DATE: 10/23/23      GOALS: Goals reviewed with patient? Yes  SHORT TERM GOALS: Target date: 11/20/2023  Patient to be independent with initial HEP. Baseline: HEP initiated Goal status: MET    LONG TERM GOALS: Target date: 12/18/2023  Patient to be independent with advanced HEP. Baseline: Not yet initiated  Goal status: IN PROGRESS  Patient to score at least 46/56 on Berg in order to decrease risk of falls.   Baseline: 43/56 Goal status: IN PROGRESS  Patient to demonstrate safe ambulation with mod I with LRAD on indoor and outdoor surfaces.  Baseline: currently unsteady with SPC indoors and requires CGA Goal status: IN PROGRESS  Patient to demonstrate 5xSTS test in <15 sec in order to decrease risk of falls.   Baseline: 5xSTS: 23.84 sec some retropulsion on 1st 2 reps  Goal status: IN PROGRESS  Patient to verbalize understanding of fall prevention in home  environment information. Baseline: Not yet initiated Goal status: INITIAL  Patient to verbalize tips to reduce freezing/festination with gait and turns. Baseline: Not yet initiated  Goal status: INITIAL   ASSESSMENT:  CLINICAL IMPRESSION: Improved performance for modified lifting techniques whilst using right AFO as no LOB experienced under demands today lifting from 4" box and demo good postural stability throughout with occasional tactile cue for lumbar/hip extension. Continued with standing balance activities withstanding resistance to improve static balance and trunk strength followed by review of HEP for multisensory balance activities with difficulty maintaining balance under demands of eyes closed and head movement conditions.  Practice for large amplitude pivot turns to improve coordination and facilitating wide BOS vs tendency for festinating/freezing of gait with good carryover and reinforced with pivot turn under resistance to improve ability to carry/transfer items with some instances of unsteadiness requiring UE support.  Will perform re-assessment at next session due to end of certification period to make best determination of next course of action   OBJECTIVE IMPAIRMENTS: Abnormal gait, decreased balance, decreased coordination, difficulty walking, decreased ROM, decreased strength, decreased safety awareness, impaired flexibility, impaired tone, improper body mechanics, postural dysfunction, and pain.   ACTIVITY LIMITATIONS: carrying, lifting, bending, sitting, standing, squatting, sleeping, stairs, transfers, bed mobility, bathing, toileting, dressing, reach over head, hygiene/grooming, and locomotion level  PARTICIPATION LIMITATIONS: meal prep, cleaning, laundry, shopping, community activity, and church  PERSONAL FACTORS: Age, Past/current experiences, Time since onset of injury/illness/exacerbation, and 3+ comorbidities: CAD, DM, HTN, hx of back surgery; sciatica, L TKA 04/21/23   are also affecting patient's functional outcome.   REHAB POTENTIAL: Good  CLINICAL DECISION MAKING: Evolving/moderate complexity  EVALUATION COMPLEXITY: Moderate  PLAN:  PT FREQUENCY: 2x/week  PT DURATION: 8 weeks  PLANNED INTERVENTIONS: 97164- PT Re-evaluation, 97110-Therapeutic exercises, 97530- Therapeutic activity, 97112- Neuromuscular re-education, 97535- Self Care, 82956- Manual therapy, [phone]- Gait training, [phone]- Orthotic Fit/training, Patient/Family education, Balance training, Stair training, Taping, Dry Needling, and DME instructions  PLAN FOR NEXT SESSION: RE-assessment   11:05 AM, 12/14/23 M. Shary Decamp, PT, DPT Physical Therapist- [place] Office Number: [phone]

## 2023-12-18 ENCOUNTER — Ambulatory Visit: Payer: Medicare HMO

## 2023-12-18 DIAGNOSIS — M6281 Muscle weakness (generalized): Secondary | ICD-10-CM

## 2023-12-18 DIAGNOSIS — R2689 Other abnormalities of gait and mobility: Secondary | ICD-10-CM

## 2023-12-18 DIAGNOSIS — G20B2 Parkinson's disease with dyskinesia, with fluctuations: Secondary | ICD-10-CM

## 2023-12-18 DIAGNOSIS — R293 Abnormal posture: Secondary | ICD-10-CM

## 2023-12-18 DIAGNOSIS — R29818 Other symptoms and signs involving the nervous system: Secondary | ICD-10-CM

## 2023-12-18 DIAGNOSIS — R2681 Unsteadiness on feet: Secondary | ICD-10-CM

## 2023-12-18 DIAGNOSIS — M21371 Foot drop, right foot: Secondary | ICD-10-CM

## 2023-12-18 NOTE — Therapy (Signed)
 OUTPATIENT PHYSICAL THERAPY NEURO TREATMENT, Progress Note, and Recertification   Patient Name: Ronald [name] MRN: [id number] DOB:[birth year], 73 y.o., male Today's Date: 12/18/2023   PCP: Woodroe Chen, MD REFERRING PROVIDER: Huston Foley, MD  Progress Note Reporting Period 11/30/23 to 12/18/23  See note below for Objective Data and Assessment of Progress/Goals.     END OF SESSION:  PT End of Session - 12/18/23 1051     Visit Number 15    Number of Visits 24    Date for PT Re-Evaluation 02/12/24    Authorization Type [employer]    Authorization Time Period auth submitted 12/18/23   auth submitted   Authorization - Visit Number --    Authorization - Number of Visits --    Progress Note Due on Visit 25    PT Start Time 1100    PT Stop Time 1145    PT Time Calculation (min) 45 min    Equipment Utilized During Treatment Gait belt    Activity Tolerance Patient tolerated treatment well    Behavior During Therapy WFL for tasks assessed/performed             Past Medical History:  Diagnosis Date   Anemia    Arthritis    Bell's palsy    CAD in native artery 06/04/2020   RCA infarct 2013.  S/p RCA and OM PCI   Diabetes mellitus type 2 in nonobese (HCC) 06/04/2020   Diabetes mellitus without complication (HCC)    Essential hypertension 06/04/2020   Floaters in visual field    Hypertension    Myocardial infarction [facility])    Parkinson's disease (HCC)    Parkinson's disease (HCC) 06/04/2020   Pure hypercholesterolemia 06/04/2020   Past Surgical History:  Procedure Laterality Date   APPENDECTOMY     back epidural   08/2017   BACK SURGERY     CORONARY ANGIOPLASTY WITH STENT PLACEMENT  08/19/2012   KNEE SURGERY     TOTAL KNEE ARTHROPLASTY Left 04/21/2023   Procedure: LEFT TOTAL KNEE ARTHROPLASTY;  Surgeon: Kathryne Hitch, MD;  Location: [facility];  Service: Orthopedics;  Laterality: Left;   Patient Active Problem List   Diagnosis Date Noted   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   High risk medication use 10/28/2021   Olecranon bursitis, left elbow 04/21/2021   Personal history of COVID-19 04/13/2021   Primary osteoarthritis, 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   Essential hypertension 06/04/2020   Pure hypercholesterolemia 06/04/2020   CAD in native artery 06/04/2020   Parkinson's disease (HCC) 06/04/2020   Diabetes mellitus type 2 in nonobese (HCC) 06/04/2020   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   Iron deficiency anemia 11/14/2019   Unilateral primary osteoarthritis, left knee 04/01/2019   Chronic left-sided low back pain with left-sided sciatica 04/01/2019   Lumbar radiculopathy 08/21/2017   Chronic pain syndrome 08/04/2017   Facet arthropathy 08/04/2017   Lumbar paraspinal muscle spasm 08/04/2017   Anemia of chronic disease 07/03/2017   Dizziness 11/10/2016   Hyperlipidemia, unspecified 10/27/2016   Other intervertebral disc degeneration, lumbar region 06/30/2016   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 heart with stable angina pectoris (HCC) 02/26/2016   SOB (shortness of breath) 02/26/2016   Old MI (myocardial infarction) 02/26/2016   OAB (overactive bladder) 09/05/2013   Ischemic heart disease 09/21/2012   Bell's palsy 06/14/2012    ONSET DATE: several years  REFERRING DIAG:  R26.81 (ICD-10-CM) - Unsteadiness on feet  M62.81 (ICD-10-CM) - Muscle weakness (generalized)  G20.B2 (ICD-10-CM) - Parkinson's disease with dyskinesia and fluctuating manifestations (HCC)  R29.3 (ICD-10-CM) - Abnormal posture    THERAPY DIAG:  Muscle weakness (generalized)  Unsteadiness on feet  Other abnormalities of gait and mobility  Other symptoms and signs involving the nervous system  Parkinson's disease with dyskinesia and fluctuating manifestations (HCC)  Abnormal posture  Right foot drop  Rationale for Evaluation and Treatment: Rehabilitation  SUBJECTIVE:                                                                                                                                                                                              SUBJECTIVE STATEMENT: Had a quiet weekend.  Returning to [employer] on Wednesday.    Pt accompanied by: self  PERTINENT HISTORY: CAD, DM, HTN, hx of back surgery; sciatica, L TKA 04/21/23  PAIN:  Are you having pain? Yes: NPRS scale: 5/10 Pain location: R LB Pain description: aching, shooting Aggravating factors: prolonged standing, lifting >25 lbs Relieving factors: taking some weight off  PRECAUTIONS: Fall  RED FLAGS: None   WEIGHT BEARING RESTRICTIONS: No  FALLS: Has patient fallen in last 6 months? Yes. Number of falls reports at least a dozen  LIVING ENVIRONMENT: Lives with: lives with their spouse Lives in: House/apartment Stairs:  2nd story with 6 steps upstairs and downstairs; 1 step to enter  Has following equipment at home: Single point cane, Walker - 2 wheeled, and upright walker, shower seat  PLOF: Independent  PATIENT GOALS: strengthening my legs and arms so I can lift things without falling   OBJECTIVE:   TODAY'S TREATMENT: 12/18/23 Activity Comments  Berg Balance Test 47/56  5xSTS test 21 sec w/out UE support, good control concentric/eccentric  Mini-BESTest 13/28  Discussion and collaboration for updated plan of treatment goals                  [facility] PT Assessment - 12/18/23 0001       Standardized Balance Assessment   Standardized Balance Assessment Berg Balance Test;Five Times Sit to Stand;Mini-BESTest    Five times sit to stand comments  21 sec   with good postural control throughout     [employer] Test   Sit to Stand Able to stand without using hands and stabilize independently    Standing Unsupported Able to stand safely 2 minutes    Sitting with Back Unsupported but Feet Supported on Floor or Stool Able to sit safely and securely 2 minutes    Stand to Sit Sits safely with minimal use of hands     Transfers Able to transfer safely, minor use of hands  Standing Unsupported with Eyes Closed Able to stand 10 seconds safely    Standing Unsupported with Feet Together Able to place feet together independently and stand 1 minute safely    From Standing, Reach Forward with Outstretched Arm Can reach forward >12 cm safely (5")    From Standing Position, Pick up Object from Floor Able to pick up shoe safely and easily    From Standing Position, Turn to Look Behind Over each Shoulder Looks behind from both sides and weight shifts well    Turn 360 Degrees Able to turn 360 degrees safely but slowly    Standing Unsupported, Alternately Place Feet on Step/Stool Able to complete 4 steps without aid or supervision    Standing Unsupported, One Foot in Front Able to take small step independently and hold 30 seconds    Standing on One Leg Able to lift leg independently and hold equal to or more than 3 seconds    Total Score 47      Mini-BESTest   Sit To Stand Normal: Comes to stand without use of hands and stabilizes independently.    Rise to Toes < 3 s.    Stand on one leg (left) Moderate: < 20 s    Stand on one leg (right) Severe: Unable    Stand on one leg - lowest score 0    Compensatory Stepping Correction - Forward Normal: Recovers independently with a single, large step (second realignement is allowed).    Compensatory Stepping Correction - Backward Moderate: More than one step is required to recover equilibrium    Compensatory Stepping Correction - Left Lateral Severe: Falls, or cannot step    Compensatory Stepping Correction - Right Lateral Normal: Recovers independently with 1 step (crossover or lateral OK)    Stepping Corredtion Lateral - lowest score 0    Stance - Feet together, eyes open, firm surface  Normal: 30s    Stance - Feet together, eyes closed, foam surface  Severe: Unable    Incline - Eyes Closed Moderate: Stands independently < 30s OR aligns with surface    Change in Gait Speed  Moderate: Unable to change walking speed or signs of imbalance    Walk with head turns - Horizontal Moderate: performs head turns with reduction in gait speed.    Walk with pivot turns Moderate:Turns with feet close SLOW (>4 steps) with good balance.    Step over obstacles Normal: Able to step over box with minimal change of gait speed and with good balance.    Timed UP & GO with Dual Task Severe: Stops counting while walking OR stops walking while counting.   regular: 10.66 sec. TUG cog:13.09   Mini-BEST total score 13                PATIENT EDUCATION: Education details: progress note details Person educated: Patient Education method: Explanation, Demonstration, Tactile cues, Verbal cues, and Handouts Education comprehension: verbalized understanding and returned demonstration  HOME EXERCISE PROGRAM: Access Code: [email or web address] URL: [email or web address] Date: 10/26/2023 Prepared by: Shary Decamp  Exercises - Sit to Stand in Stride with AFO and UE Assist in LE Alignment  - 1 x daily - 7 x weekly - 3-5 sets - 5 reps - Corner Balance Feet Together With Eyes Open  - 1 x daily - 7 x weekly - 1-3 sets - 30 sec hold - Corner Balance Feet Together With Eyes Closed  - 1 x daily - 7 x weekly - 1-3 sets - 30 sec hold -  Corner Balance Feet Together: Eyes Open With Head Turns  - 1 x daily - 7 x weekly - 3 sets - 3 reps - Corner Balance Feet Together: Eyes Closed With Head Turns  - 1 x daily - 7 x weekly - 3 sets - 3 reps - Standing Gastroc Stretch  - 1 x daily - 7 x weekly - 3 sets - 60 sec hold  Note: Objective measures were completed at Evaluation unless otherwise noted.  DIAGNOSTIC FINDINGS: none recent  COGNITION: Overall cognitive status: Within functional limits for tasks assessed   SENSATION: Pt reports diminished sensation in B LEs but testing intact to light touch  COORDINATION: Alternating pronation/supination: very slightly slowed  Alternating toe tap: reduced  ROM on R  Finger to nose: slight intention tremor B  MUSCLE TONE: slightly increased tone in R quad   POSTURE: L lean sitting in chair, flexed posture   LOWER EXTREMITY ROM:     Active  Right Eval Left Eval  Hip flexion    Hip extension    Hip abduction    Hip adduction    Hip internal rotation    Hip external rotation    Knee flexion  106  Knee extension  2  Ankle dorsiflexion -9 1  Ankle plantarflexion    Ankle inversion    Ankle eversion     (Blank rows = not tested)  LOWER EXTREMITY MMT:    MMT (in sitting) Right Eval Left Eval  Hip flexion 4+ 5  Hip extension    Hip abduction 4 4+  Hip adduction 4 4+  Hip internal rotation    Hip external rotation    Knee flexion 4+ 4 *c/o muscle spasm in L toes  Knee extension 4+ 4+  Ankle dorsiflexion 2 4-  Ankle plantarflexion 2+ 4  Ankle inversion    Ankle eversion    (Blank rows = not tested)     GAIT: Gait pattern: audible R foot slap, R LE slightly scissoring, hip instability and imbalance  Assistive device utilized: Single point cane Level of assistance: CGA   FUNCTIONAL TESTS:  82M walk: 18.03 sec (1.82 ft/sec) 5xSTS: 23.84 sec some retropulsion on 1st 2 reps                                                                                                                               TREATMENT DATE: 10/23/23      GOALS: Goals reviewed with patient? Yes  SHORT TERM GOALS: Target date: 11/20/2023  Patient to be independent with initial HEP. Baseline: HEP initiated Goal status: MET    LONG TERM GOALS: Target date: 02/12/2024    Patient to be independent with advanced HEP. Baseline:  Goal status: IN PROGRESS  Patient to score at least 46/56 on Berg in order to decrease risk of falls.   Baseline: 43/56; (12/18/23) 47/56 Goal status: MET  Patient to demonstrate safe ambulation with mod I with LRAD on  indoor and outdoor surfaces.  Baseline: (12/18/23) modified independent w/ cane and right AFO  various surfaces Goal status: MET  Patient to demonstrate 5xSTS test in <15 sec in order to decrease risk of falls.   Baseline: 5xSTS: 23.84 sec some retropulsion on 1st 2 reps; (12/18/23) 21 sec-good control Goal status: IN PROGRESS  Patient to verbalize understanding of fall prevention in home environment information. Baseline: independent teach-back Goal status: MET  Patient to verbalize tips to reduce freezing/festination with gait and turns. Baseline: teach-back of 4's, improved with R AFO but requires 4-6 steps for 180 degree turn Goal status: IN PROGRESS  -  Demo improved balance and reduced risk for falls per score 18/28 Mini-BESTest to improve safety with mobility  -  Baseline: 13/28  -  Goal status: INITIAL  -  Demo ability to maintain balance during golfing activities to enable return to leisure  -  Baseline: unable  -  Goal status: INITIAL    ASSESSMENT:  CLINICAL IMPRESSION: Progress note and recertification completed on this date.  Improved performance under [employer] from previous 43/56 (indicative of high risk for falls) to 47/56 indicating low risk for falls. Improved control under 5xSTS test conditions with ability to perform without UE support and great eccentric control throughout and no retro-pulsion.  Mini-BESTest performed for further assessment and reveals score 13/28 indicating high risk for falls and revealing for ongoing deficits with postural control and deficits with dynamic balance/gait.  His ability to perform pivots/turns is markedly improved with use of right AFO  and reports reduced instances of freezing using device vs without but does demonstrate deficits in turning requiring 4-6 steps to negotiate 180 degree turn.  Pt reports ongoing activity/participation limitations due to mobility deficits reporting inability to play golf due to balance issues as well as noting decreased endurance to enable putting on a practice green as well.  Pt would  benefit from ongoing PT sessions to address deficits and limitations to further reduce fall risk and enable greater activity tolerance/participation.   OBJECTIVE IMPAIRMENTS: Abnormal gait, decreased balance, decreased coordination, difficulty walking, decreased ROM, decreased strength, decreased safety awareness, impaired flexibility, impaired tone, improper body mechanics, postural dysfunction, and pain.   ACTIVITY LIMITATIONS: carrying, lifting, bending, sitting, standing, squatting, sleeping, stairs, transfers, bed mobility, bathing, toileting, dressing, reach over head, hygiene/grooming, and locomotion level  PARTICIPATION LIMITATIONS: meal prep, cleaning, laundry, shopping, community activity, and church  PERSONAL FACTORS: Age, Past/current experiences, Time since onset of injury/illness/exacerbation, and 3+ comorbidities: CAD, DM, HTN, hx of back surgery; sciatica, L TKA 04/21/23  are also affecting patient's functional outcome.   REHAB POTENTIAL: Good  CLINICAL DECISION MAKING: Evolving/moderate complexity  EVALUATION COMPLEXITY: Moderate  PLAN:  PT FREQUENCY: 1-2x/week  PT DURATION: 8 weeks  PLANNED INTERVENTIONS: 97164- PT Re-evaluation, 97110-Therapeutic exercises, 97530- Therapeutic activity, 97112- Neuromuscular re-education, 97535- Self Care, 11914- Manual therapy, [phone]- Gait training, [phone]- Orthotic Fit/training, Patient/Family education, Balance training, Stair training, Taping, Dry Needling, and DME instructions  PLAN FOR NEXT SESSION: golf activities, stool/cane for putting green for seated rest breaks (or modified rollator with club caddy)    11:55 AM, 12/18/23 M. Shary Decamp, PT, DPT Physical Therapist- [place] Office Number: [phone]    Referring diagnosis?  R26.81 (ICD-10-CM) - Unsteadiness on feet  M62.81 (ICD-10-CM) - Muscle weakness (generalized)  G20.B2 (ICD-10-CM) - Parkinson's disease with dyskinesia and fluctuating manifestations (HCC)  R29.3  (ICD-10-CM) - Abnormal posture   Treatment diagnosis? (if different than referring diagnosis) Muscle weakness (generalized)  Unsteadiness on feet  Other abnormalities of gait and mobility  Other symptoms and signs involving the nervous system  Parkinson's disease with dyskinesia and fluctuating manifestations (HCC)  Abnormal posture  Right foot drop What was this (referring dx) caused by? []  Surgery []  Fall [x]  Ongoing issue []  Arthritis []  Other: ____________  Laterality: []  Rt []  Lt [x]  Both  Check all possible CPT codes:  *CHOOSE 10 OR LESS*    See Planned Interventions listed in the Plan section of the Evaluation.

## 2023-12-21 ENCOUNTER — Ambulatory Visit: Payer: Medicare HMO | Attending: Neurology

## 2023-12-21 DIAGNOSIS — R2689 Other abnormalities of gait and mobility: Secondary | ICD-10-CM | POA: Diagnosis present

## 2023-12-21 DIAGNOSIS — R293 Abnormal posture: Secondary | ICD-10-CM | POA: Diagnosis present

## 2023-12-21 DIAGNOSIS — G20B2 Parkinson's disease with dyskinesia, with fluctuations: Secondary | ICD-10-CM | POA: Insufficient documentation

## 2023-12-21 DIAGNOSIS — M6281 Muscle weakness (generalized): Secondary | ICD-10-CM | POA: Diagnosis present

## 2023-12-21 DIAGNOSIS — R29818 Other symptoms and signs involving the nervous system: Secondary | ICD-10-CM | POA: Insufficient documentation

## 2023-12-21 DIAGNOSIS — M21371 Foot drop, right foot: Secondary | ICD-10-CM | POA: Diagnosis present

## 2023-12-21 DIAGNOSIS — R2681 Unsteadiness on feet: Secondary | ICD-10-CM | POA: Diagnosis present

## 2023-12-21 NOTE — Therapy (Signed)
 OUTPATIENT PHYSICAL THERAPY NEURO TREATMENT,   Patient Name: Ronald [name] MRN: [id number] DOB:1951/08/27, 73 y.o., male Today's Date: 12/21/2023   PCP: Woodroe Chen, MD REFERRING PROVIDER: Huston Foley, MD     END OF SESSION:  PT End of Session - 12/21/23 1103     Visit Number 16    Number of Visits 24    Date for PT Re-Evaluation 02/12/24    Authorization Type [employer]    Authorization Time Period 9 visits from 3/31 - 5/26   auth submitted   Authorization - Visit Number 1    Authorization - Number of Visits 9    Progress Note Due on Visit 25    PT Start Time 1103    PT Stop Time 1145    PT Time Calculation (min) 42 min    Equipment Utilized During Treatment Gait belt    Activity Tolerance Patient tolerated treatment well    Behavior During Therapy WFL for tasks assessed/performed             Past Medical History:  Diagnosis Date   Anemia    Arthritis    Bell's palsy    CAD in native artery 06/04/2020   RCA infarct 2013.  S/p RCA and OM PCI   Diabetes mellitus type 2 in nonobese (HCC) 06/04/2020   Diabetes mellitus without complication (HCC)    Essential hypertension 06/04/2020   Floaters in visual field    Hypertension    Myocardial infarction [facility])    Parkinson's disease (HCC)    Parkinson's disease (HCC) 06/04/2020   Pure hypercholesterolemia 06/04/2020   Past Surgical History:  Procedure Laterality Date   APPENDECTOMY     back epidural   08/2017   BACK SURGERY     CORONARY ANGIOPLASTY WITH STENT PLACEMENT  08/19/2012   KNEE SURGERY     TOTAL KNEE ARTHROPLASTY Left 04/21/2023   Procedure: LEFT TOTAL KNEE ARTHROPLASTY;  Surgeon: Kathryne Hitch, MD;  Location: [facility];  Service: Orthopedics;  Laterality: Left;   Patient Active Problem List   Diagnosis Date Noted   Status post total left knee replacement 04/21/2023   High risk medication use 10/28/2021   Olecranon bursitis, left elbow 04/21/2021   Personal history of COVID-19  04/13/2021   Primary osteoarthritis, 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   Essential hypertension 06/04/2020   Pure hypercholesterolemia 06/04/2020   CAD in native artery 06/04/2020   Parkinson's disease (HCC) 06/04/2020   Diabetes mellitus type 2 in nonobese (HCC) 06/04/2020   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   Iron deficiency anemia 11/14/2019   Unilateral primary osteoarthritis, left knee 04/01/2019   Chronic left-sided low back pain with left-sided sciatica 04/01/2019   Lumbar radiculopathy 08/21/2017   Chronic pain syndrome 08/04/2017   Facet arthropathy 08/04/2017   Lumbar paraspinal muscle spasm 08/04/2017   Anemia of chronic disease 07/03/2017   Dizziness 11/10/2016   Hyperlipidemia, unspecified 10/27/2016   Other intervertebral disc degeneration, lumbar region 06/30/2016   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 heart with stable angina pectoris (HCC) 02/26/2016   SOB (shortness of breath) 02/26/2016   Old MI (myocardial infarction) 02/26/2016   OAB (overactive bladder) 09/05/2013   Ischemic heart disease 09/21/2012   Bell's palsy 06/14/2012    ONSET DATE: several years  REFERRING DIAG:  R26.81 (ICD-10-CM) - Unsteadiness on feet  M62.81 (ICD-10-CM) - Muscle weakness (generalized)  G20.B2 (ICD-10-CM) - Parkinson's disease with dyskinesia and fluctuating manifestations (HCC)  R29.3 (ICD-10-CM) - Abnormal posture  THERAPY DIAG:  Muscle weakness (generalized)  Unsteadiness on feet  Other abnormalities of gait and mobility  Other symptoms and signs involving the nervous system  Parkinson's disease with dyskinesia and fluctuating manifestations (HCC)  Abnormal posture  Right foot drop  Rationale for Evaluation and Treatment: Rehabilitation  SUBJECTIVE:                                                                                                                                                                                             SUBJECTIVE  STATEMENT: Had AFO adjusted to add more padding, still rubbing same spot. Feeling a little lightheaded   Pt accompanied by: self  PERTINENT HISTORY: CAD, DM, HTN, hx of back surgery; sciatica, L TKA 04/21/23  PAIN:  Are you having pain? Yes: NPRS scale: 5/10 Pain location: R LB Pain description: aching, shooting Aggravating factors: prolonged standing, lifting >25 lbs Relieving factors: taking some weight off  PRECAUTIONS: Fall  RED FLAGS: None   WEIGHT BEARING RESTRICTIONS: No  FALLS: Has patient fallen in last 6 months? Yes. Number of falls reports at least a dozen  LIVING ENVIRONMENT: Lives with: lives with their spouse Lives in: House/apartment Stairs:  2nd story with 6 steps upstairs and downstairs; 1 step to enter  Has following equipment at home: Single point cane, Walker - 2 wheeled, and upright walker, shower seat  PLOF: Independent  PATIENT GOALS: strengthening my legs and arms so I can lift things without falling   OBJECTIVE:   TODAY'S TREATMENT: 12/21/23 Activity Comments  Vitals: 108/65 mmHg, 71 bpm, 96%   Seated LE OKC PRE 10#, 3x10 -LAQ -Hip add iso -hip abd blue -hamstring curls blue  110/68 mmHg, 67 bpm   Standing marches at counter 1x10 10#  Feeling light headed   Vitals: 89/56, 71 bpm Standing leaning against EOM x 1 min  Vitals: 95/63 mmHg, 70 bpm Standing leaning against EOM x 3 min  Pt education on home assessment for orthostatic hypotension Educated on importance of adequate hydration     TODAY'S TREATMENT: 12/18/23 Activity Comments  Berg Balance Test 47/56  5xSTS test 21 sec w/out UE support, good control concentric/eccentric  Mini-BESTest 13/28  Discussion and collaboration for updated plan of treatment goals                       PATIENT EDUCATION: Education details: progress note details Person educated: Patient Education method: Explanation, Demonstration, Tactile cues, Verbal cues, and Handouts Education comprehension:  verbalized understanding and returned demonstration  HOME EXERCISE PROGRAM: Access Code: [email or web address] URL: [email or web address] Date: 10/26/2023 Prepared by: Shary Decamp  Exercises -  Sit to Stand in Stride with AFO and UE Assist in LE Alignment  - 1 x daily - 7 x weekly - 3-5 sets - 5 reps - Corner Balance Feet Together With Eyes Open  - 1 x daily - 7 x weekly - 1-3 sets - 30 sec hold - Corner Balance Feet Together With Eyes Closed  - 1 x daily - 7 x weekly - 1-3 sets - 30 sec hold - Corner Balance Feet Together: Eyes Open With Head Turns  - 1 x daily - 7 x weekly - 3 sets - 3 reps - Corner Balance Feet Together: Eyes Closed With Head Turns  - 1 x daily - 7 x weekly - 3 sets - 3 reps - Standing Gastroc Stretch  - 1 x daily - 7 x weekly - 3 sets - 60 sec hold  Note: Objective measures were completed at Evaluation unless otherwise noted.  DIAGNOSTIC FINDINGS: none recent  COGNITION: Overall cognitive status: Within functional limits for tasks assessed   SENSATION: Pt reports diminished sensation in B LEs but testing intact to light touch  COORDINATION: Alternating pronation/supination: very slightly slowed  Alternating toe tap: reduced ROM on R  Finger to nose: slight intention tremor B  MUSCLE TONE: slightly increased tone in R quad   POSTURE: L lean sitting in chair, flexed posture   LOWER EXTREMITY ROM:     Active  Right Eval Left Eval  Hip flexion    Hip extension    Hip abduction    Hip adduction    Hip internal rotation    Hip external rotation    Knee flexion  106  Knee extension  2  Ankle dorsiflexion -9 1  Ankle plantarflexion    Ankle inversion    Ankle eversion     (Blank rows = not tested)  LOWER EXTREMITY MMT:    MMT (in sitting) Right Eval Left Eval  Hip flexion 4+ 5  Hip extension    Hip abduction 4 4+  Hip adduction 4 4+  Hip internal rotation    Hip external rotation    Knee flexion 4+ 4 *c/o muscle spasm in L toes  Knee  extension 4+ 4+  Ankle dorsiflexion 2 4-  Ankle plantarflexion 2+ 4  Ankle inversion    Ankle eversion    (Blank rows = not tested)     GAIT: Gait pattern: audible R foot slap, R LE slightly scissoring, hip instability and imbalance  Assistive device utilized: Single point cane Level of assistance: CGA   FUNCTIONAL TESTS:  10M walk: 18.03 sec (1.82 ft/sec) 5xSTS: 23.84 sec some retropulsion on 1st 2 reps                                                                                                                               TREATMENT DATE: 10/23/23      GOALS: Goals reviewed with patient? Yes  SHORT TERM GOALS: Target date:  11/20/2023  Patient to be independent with initial HEP. Baseline: HEP initiated Goal status: MET    LONG TERM GOALS: Target date: 02/12/2024    Patient to be independent with advanced HEP. Baseline:  Goal status: IN PROGRESS  Patient to score at least 46/56 on Berg in order to decrease risk of falls.   Baseline: 43/56; (12/18/23) 47/56 Goal status: MET  Patient to demonstrate safe ambulation with mod I with LRAD on indoor and outdoor surfaces.  Baseline: (12/18/23) modified independent w/ cane and right AFO various surfaces Goal status: MET  Patient to demonstrate 5xSTS test in <15 sec in order to decrease risk of falls.   Baseline: 5xSTS: 23.84 sec some retropulsion on 1st 2 reps; (12/18/23) 21 sec-good control Goal status: IN PROGRESS  Patient to verbalize understanding of fall prevention in home environment information. Baseline: independent teach-back Goal status: MET  Patient to verbalize tips to reduce freezing/festination with gait and turns. Baseline: teach-back of 4's, improved with R AFO but requires 4-6 steps for 180 degree turn Goal status: IN PROGRESS  -  Demo improved balance and reduced risk for falls per score 18/28 Mini-BESTest to improve safety with mobility  -  Baseline: 13/28  -  Goal status: INITIAL  -  Demo  ability to maintain balance during golfing activities to enable return to leisure  -  Baseline: unable  -  Goal status: INITIAL    ASSESSMENT:  CLINICAL IMPRESSION: Pt reports feeling lightheaded with standing/walking.  Vitals assessed in sitting and do reveal low baseline but largely asymptomatic with sitting.  Proceeded with seated LE OKC PRE to improve strength and as means for elevating BP. Initiated standing exercise and become symptomatic with lightheadedness/dizziness and standing x 1 min to 3 min reveals 21-point drop systolic and 12-point drop diastolic.  Pt reports he does not tend to drink much water on a day to day basis and encouraged more diligent efforts for hydration throughout the day. Instructed in and provided instructions for orthostatic measurements for see if a trend is evident. Continued sessions to progress POC details and continued monitoring   OBJECTIVE IMPAIRMENTS: Abnormal gait, decreased balance, decreased coordination, difficulty walking, decreased ROM, decreased strength, decreased safety awareness, impaired flexibility, impaired tone, improper body mechanics, postural dysfunction, and pain.   ACTIVITY LIMITATIONS: carrying, lifting, bending, sitting, standing, squatting, sleeping, stairs, transfers, bed mobility, bathing, toileting, dressing, reach over head, hygiene/grooming, and locomotion level  PARTICIPATION LIMITATIONS: meal prep, cleaning, laundry, shopping, community activity, and church  PERSONAL FACTORS: Age, Past/current experiences, Time since onset of injury/illness/exacerbation, and 3+ comorbidities: CAD, DM, HTN, hx of back surgery; sciatica, L TKA 04/21/23  are also affecting patient's functional outcome.   REHAB POTENTIAL: Good  CLINICAL DECISION MAKING: Evolving/moderate complexity  EVALUATION COMPLEXITY: Moderate  PLAN:  PT FREQUENCY: 1-2x/week  PT DURATION: 8 weeks  PLANNED INTERVENTIONS: 97164- PT Re-evaluation, 97110-Therapeutic  exercises, 97530- Therapeutic activity, 97112- Neuromuscular re-education, 97535- Self Care, 16109- Manual therapy, [phone]- Gait training, [phone]- Orthotic Fit/training, Patient/Family education, Balance training, Stair training, Taping, Dry Needling, and DME instructions  PLAN FOR NEXT SESSION: golf activities, stool/cane for putting green for seated rest breaks (or modified rollator with club caddy)    11:05 AM, 12/21/23 M. Shary Decamp, PT, DPT Physical Therapist- [place] Office Number: [phone]

## 2023-12-28 ENCOUNTER — Ambulatory Visit

## 2023-12-28 DIAGNOSIS — M21371 Foot drop, right foot: Secondary | ICD-10-CM

## 2023-12-28 DIAGNOSIS — R29818 Other symptoms and signs involving the nervous system: Secondary | ICD-10-CM

## 2023-12-28 DIAGNOSIS — G20B2 Parkinson's disease with dyskinesia, with fluctuations: Secondary | ICD-10-CM

## 2023-12-28 DIAGNOSIS — M6281 Muscle weakness (generalized): Secondary | ICD-10-CM | POA: Diagnosis not present

## 2023-12-28 DIAGNOSIS — R2689 Other abnormalities of gait and mobility: Secondary | ICD-10-CM

## 2023-12-28 DIAGNOSIS — R293 Abnormal posture: Secondary | ICD-10-CM

## 2023-12-28 DIAGNOSIS — R2681 Unsteadiness on feet: Secondary | ICD-10-CM

## 2023-12-28 NOTE — Therapy (Signed)
 OUTPATIENT PHYSICAL THERAPY NEURO TREATMENT,   Patient Name: Ronald [name] MRN: [id number] DOB:Jan 09, 1951, 73 y.o., male Today's Date: 12/28/2023   PCP: Woodroe Chen, MD REFERRING PROVIDER: Huston Foley, MD     END OF SESSION:  PT End of Session - 12/28/23 0934     Visit Number 17    Number of Visits 24    Date for PT Re-Evaluation 02/12/24    Authorization Type [employer]    Authorization Time Period 9 visits from 3/31 - 5/26   auth submitted   Authorization - Visit Number 2    Authorization - Number of Visits 9    Progress Note Due on Visit 25    PT Start Time 0930    PT Stop Time 1015    PT Time Calculation (min) 45 min    Equipment Utilized During Treatment Gait belt    Activity Tolerance Patient tolerated treatment well    Behavior During Therapy WFL for tasks assessed/performed             Past Medical History:  Diagnosis Date   Anemia    Arthritis    Bell's palsy    CAD in native artery 06/04/2020   RCA infarct 2013.  S/p RCA and OM PCI   Diabetes mellitus type 2 in nonobese (HCC) 06/04/2020   Diabetes mellitus without complication (HCC)    Essential hypertension 06/04/2020   Floaters in visual field    Hypertension    Myocardial infarction [facility])    Parkinson's disease (HCC)    Parkinson's disease (HCC) 06/04/2020   Pure hypercholesterolemia 06/04/2020   Past Surgical History:  Procedure Laterality Date   APPENDECTOMY     back epidural   08/2017   BACK SURGERY     CORONARY ANGIOPLASTY WITH STENT PLACEMENT  08/19/2012   KNEE SURGERY     TOTAL KNEE ARTHROPLASTY Left 04/21/2023   Procedure: LEFT TOTAL KNEE ARTHROPLASTY;  Surgeon: Kathryne Hitch, MD;  Location: [facility];  Service: Orthopedics;  Laterality: Left;   Patient Active Problem List   Diagnosis Date Noted   Status post total left knee replacement 04/21/2023   High risk medication use 10/28/2021   Olecranon bursitis, left elbow 04/21/2021   Personal history of COVID-19  04/13/2021   Primary osteoarthritis, 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   Essential hypertension 06/04/2020   Pure hypercholesterolemia 06/04/2020   CAD in native artery 06/04/2020   Parkinson's disease (HCC) 06/04/2020   Diabetes mellitus type 2 in nonobese (HCC) 06/04/2020   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   Iron deficiency anemia 11/14/2019   Unilateral primary osteoarthritis, left knee 04/01/2019   Chronic left-sided low back pain with left-sided sciatica 04/01/2019   Lumbar radiculopathy 08/21/2017   Chronic pain syndrome 08/04/2017   Facet arthropathy 08/04/2017   Lumbar paraspinal muscle spasm 08/04/2017   Anemia of chronic disease 07/03/2017   Dizziness 11/10/2016   Hyperlipidemia, unspecified 10/27/2016   Other intervertebral disc degeneration, lumbar region 06/30/2016   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 heart with stable angina pectoris (HCC) 02/26/2016   SOB (shortness of breath) 02/26/2016   Old MI (myocardial infarction) 02/26/2016   OAB (overactive bladder) 09/05/2013   Ischemic heart disease 09/21/2012   Bell's palsy 06/14/2012    ONSET DATE: several years  REFERRING DIAG:  R26.81 (ICD-10-CM) - Unsteadiness on feet  M62.81 (ICD-10-CM) - Muscle weakness (generalized)  G20.B2 (ICD-10-CM) - Parkinson's disease with dyskinesia and fluctuating manifestations (HCC)  R29.3 (ICD-10-CM) - Abnormal posture  THERAPY DIAG:  Muscle weakness (generalized)  Unsteadiness on feet  Other abnormalities of gait and mobility  Other symptoms and signs involving the nervous system  Parkinson's disease with dyskinesia and fluctuating manifestations (HCC)  Abnormal posture  Right foot drop  Rationale for Evaluation and Treatment: Rehabilitation  SUBJECTIVE:                                                                                                                                                                                             SUBJECTIVE  STATEMENT: Had a fall when bending forward cleaning up after mowing, no injury. The AFO continues to rub the shin. Haven't been lightheaded as of late.    Pt accompanied by: self  PERTINENT HISTORY: CAD, DM, HTN, hx of back surgery; sciatica, L TKA 04/21/23  PAIN:  Are you having pain? Yes: NPRS scale: 5/10 Pain location: R LB Pain description: aching, shooting Aggravating factors: prolonged standing, lifting >25 lbs Relieving factors: taking some weight off  PRECAUTIONS: Fall  RED FLAGS: None   WEIGHT BEARING RESTRICTIONS: No  FALLS: Has patient fallen in last 6 months? Yes. Number of falls reports at least a dozen  LIVING ENVIRONMENT: Lives with: lives with their spouse Lives in: House/apartment Stairs:  2nd story with 6 steps upstairs and downstairs; 1 step to enter  Has following equipment at home: Single point cane, Walker - 2 wheeled, and upright walker, shower seat  PLOF: Independent  PATIENT GOALS: strengthening my legs and arms so I can lift things without falling   OBJECTIVE:   TODAY'S TREATMENT: 12/28/23 Activity Comments  Supine PWR moves  1x10--twist modified for RLE hip/knee flexion to alleviate radicular pain  Thera act Instruction and modifications for donning/doffing AFO                 TODAY'S TREATMENT: 12/21/23 Activity Comments  Vitals: 108/65 mmHg, 71 bpm, 96%   Seated LE OKC PRE 10#, 3x10 -LAQ -Hip add iso -hip abd blue -hamstring curls blue  110/68 mmHg, 67 bpm   Standing marches at counter 1x10 10#  Feeling light headed   Vitals: 89/56, 71 bpm Standing leaning against EOM x 1 min  Vitals: 95/63 mmHg, 70 bpm Standing leaning against EOM x 3 min  Pt education on home assessment for orthostatic hypotension Educated on importance of adequate hydration     TODAY'S TREATMENT: 12/18/23 Activity Comments  Berg Balance Test 47/56  5xSTS test 21 sec w/out UE support, good control concentric/eccentric  Mini-BESTest 13/28  Discussion and  collaboration for updated plan of treatment goals  PATIENT EDUCATION: Education details: progress note details Person educated: Patient Education method: Explanation, Demonstration, Tactile cues, Verbal cues, and Handouts Education comprehension: verbalized understanding and returned demonstration  HOME EXERCISE PROGRAM: Access Code: [email or web address] URL: [email or web address] Date: 10/26/2023 Prepared by: Shary Decamp  Exercises - Sit to Stand in Stride with AFO and UE Assist in LE Alignment  - 1 x daily - 7 x weekly - 3-5 sets - 5 reps - Corner Balance Feet Together With Eyes Open  - 1 x daily - 7 x weekly - 1-3 sets - 30 sec hold - Corner Balance Feet Together With Eyes Closed  - 1 x daily - 7 x weekly - 1-3 sets - 30 sec hold - Corner Balance Feet Together: Eyes Open With Head Turns  - 1 x daily - 7 x weekly - 3 sets - 3 reps - Corner Balance Feet Together: Eyes Closed With Head Turns  - 1 x daily - 7 x weekly - 3 sets - 3 reps - Standing Gastroc Stretch  - 1 x daily - 7 x weekly - 3 sets - 60 sec hold  Note: Objective measures were completed at Evaluation unless otherwise noted.  DIAGNOSTIC FINDINGS: none recent  COGNITION: Overall cognitive status: Within functional limits for tasks assessed   SENSATION: Pt reports diminished sensation in B LEs but testing intact to light touch  COORDINATION: Alternating pronation/supination: very slightly slowed  Alternating toe tap: reduced ROM on R  Finger to nose: slight intention tremor B  MUSCLE TONE: slightly increased tone in R quad   POSTURE: L lean sitting in chair, flexed posture   LOWER EXTREMITY ROM:     Active  Right Eval Left Eval  Hip flexion    Hip extension    Hip abduction    Hip adduction    Hip internal rotation    Hip external rotation    Knee flexion  106  Knee extension  2  Ankle dorsiflexion -9 1  Ankle plantarflexion    Ankle inversion    Ankle eversion      (Blank rows = not tested)  LOWER EXTREMITY MMT:    MMT (in sitting) Right Eval Left Eval  Hip flexion 4+ 5  Hip extension    Hip abduction 4 4+  Hip adduction 4 4+  Hip internal rotation    Hip external rotation    Knee flexion 4+ 4 *c/o muscle spasm in L toes  Knee extension 4+ 4+  Ankle dorsiflexion 2 4-  Ankle plantarflexion 2+ 4  Ankle inversion    Ankle eversion    (Blank rows = not tested)     GAIT: Gait pattern: audible R foot slap, R LE slightly scissoring, hip instability and imbalance  Assistive device utilized: Single point cane Level of assistance: CGA   FUNCTIONAL TESTS:  80M walk: 18.03 sec (1.82 ft/sec) 5xSTS: 23.84 sec some retropulsion on 1st 2 reps  TREATMENT DATE: 10/23/23      GOALS: Goals reviewed with patient? Yes  SHORT TERM GOALS: Target date: 11/20/2023  Patient to be independent with initial HEP. Baseline: HEP initiated Goal status: MET    LONG TERM GOALS: Target date: 02/12/2024    Patient to be independent with advanced HEP. Baseline:  Goal status: IN PROGRESS  Patient to score at least 46/56 on Berg in order to decrease risk of falls.   Baseline: 43/56; (12/18/23) 47/56 Goal status: MET  Patient to demonstrate safe ambulation with mod I with LRAD on indoor and outdoor surfaces.  Baseline: (12/18/23) modified independent w/ cane and right AFO various surfaces Goal status: MET  Patient to demonstrate 5xSTS test in <15 sec in order to decrease risk of falls.   Baseline: 5xSTS: 23.84 sec some retropulsion on 1st 2 reps; (12/18/23) 21 sec-good control Goal status: IN PROGRESS  Patient to verbalize understanding of fall prevention in home environment information. Baseline: independent teach-back Goal status: MET  Patient to verbalize tips to reduce freezing/festination with gait and turns. Baseline:  teach-back of 4's, improved with R AFO but requires 4-6 steps for 180 degree turn Goal status: IN PROGRESS  -  Demo improved balance and reduced risk for falls per score 18/28 Mini-BESTest to improve safety with mobility  -  Baseline: 13/28  -  Goal status: INITIAL  -  Demo ability to maintain balance during golfing activities to enable return to leisure  -  Baseline: unable  -  Goal status: INITIAL    ASSESSMENT:  CLINICAL IMPRESSION: Initiated with supine large amplitude movements to improve coordination, flexibility, and enable greater ability for positioning in bed mobility w/ cues for sequence and full ROM for activities.  Pt notes ongoing discomfort from right AFO rubbing a sore on tibial crest.  Assisted pt in modifying padding and donning/doffing/positioning to improve fitment and reduce shearing for improved comfort and tolerance for wearing as device greatly helps his right foot drop and balance deficits.  Pt largely displeased with this ground-reaction AFO for his right foot drop and performed well with trials using posterior leaf spring. Provided with reference materials for alternative brace that he could speak with P&O about.    OBJECTIVE IMPAIRMENTS: Abnormal gait, decreased balance, decreased coordination, difficulty walking, decreased ROM, decreased strength, decreased safety awareness, impaired flexibility, impaired tone, improper body mechanics, postural dysfunction, and pain.   ACTIVITY LIMITATIONS: carrying, lifting, bending, sitting, standing, squatting, sleeping, stairs, transfers, bed mobility, bathing, toileting, dressing, reach over head, hygiene/grooming, and locomotion level  PARTICIPATION LIMITATIONS: meal prep, cleaning, laundry, shopping, community activity, and church  PERSONAL FACTORS: Age, Past/current experiences, Time since onset of injury/illness/exacerbation, and 3+ comorbidities: CAD, DM, HTN, hx of back surgery; sciatica, L TKA 04/21/23  are also  affecting patient's functional outcome.   REHAB POTENTIAL: Good  CLINICAL DECISION MAKING: Evolving/moderate complexity  EVALUATION COMPLEXITY: Moderate  PLAN:  PT FREQUENCY: 1-2x/week  PT DURATION: 8 weeks  PLANNED INTERVENTIONS: 97164- PT Re-evaluation, 97110-Therapeutic exercises, 97530- Therapeutic activity, 97112- Neuromuscular re-education, 97535- Self Care, 98119- Manual therapy, [phone]- Gait training, [phone]- Orthotic Fit/training, Patient/Family education, Balance training, Stair training, Taping, Dry Needling, and DME instructions  PLAN FOR NEXT SESSION: golf activities, stool/cane for putting green for seated rest breaks (or modified rollator with club caddy)    9:35 AM, 12/28/23 M. Shary Decamp, PT, DPT Physical Therapist- [place] Office Number: [phone]

## 2024-01-04 ENCOUNTER — Ambulatory Visit

## 2024-01-04 DIAGNOSIS — M21371 Foot drop, right foot: Secondary | ICD-10-CM

## 2024-01-04 DIAGNOSIS — R293 Abnormal posture: Secondary | ICD-10-CM

## 2024-01-04 DIAGNOSIS — G20B2 Parkinson's disease with dyskinesia, with fluctuations: Secondary | ICD-10-CM

## 2024-01-04 DIAGNOSIS — R29818 Other symptoms and signs involving the nervous system: Secondary | ICD-10-CM

## 2024-01-04 DIAGNOSIS — M6281 Muscle weakness (generalized): Secondary | ICD-10-CM | POA: Diagnosis not present

## 2024-01-04 DIAGNOSIS — R2689 Other abnormalities of gait and mobility: Secondary | ICD-10-CM

## 2024-01-04 DIAGNOSIS — R2681 Unsteadiness on feet: Secondary | ICD-10-CM

## 2024-01-04 NOTE — Therapy (Signed)
 OUTPATIENT PHYSICAL THERAPY NEURO TREATMENT,   Patient Name: Ronald [name] MRN: [id number] DOB:March 12, 1951, 73 y.o., male Today's Date: 01/04/2024   PCP: Woodroe Chen, MD REFERRING PROVIDER: Huston Foley, MD     END OF SESSION:  PT End of Session - 01/04/24 1055     Visit Number 18    Number of Visits 24    Date for PT Re-Evaluation 02/12/24    Authorization Type [employer]    Authorization Time Period 9 visits from 3/31 - 5/26   auth submitted   Authorization - Visit Number 3    Authorization - Number of Visits 9    Progress Note Due on Visit 25    PT Start Time 1100    PT Stop Time 1145    PT Time Calculation (min) 45 min    Equipment Utilized During Treatment Gait belt    Activity Tolerance Patient tolerated treatment well    Behavior During Therapy WFL for tasks assessed/performed             Past Medical History:  Diagnosis Date   Anemia    Arthritis    Bell's palsy    CAD in native artery 06/04/2020   RCA infarct 2013.  S/p RCA and OM PCI   Diabetes mellitus type 2 in nonobese (HCC) 06/04/2020   Diabetes mellitus without complication (HCC)    Essential hypertension 06/04/2020   Floaters in visual field    Hypertension    Myocardial infarction [facility])    Parkinson's disease (HCC)    Parkinson's disease (HCC) 06/04/2020   Pure hypercholesterolemia 06/04/2020   Past Surgical History:  Procedure Laterality Date   APPENDECTOMY     back epidural   08/2017   BACK SURGERY     CORONARY ANGIOPLASTY WITH STENT PLACEMENT  08/19/2012   KNEE SURGERY     TOTAL KNEE ARTHROPLASTY Left 04/21/2023   Procedure: LEFT TOTAL KNEE ARTHROPLASTY;  Surgeon: Kathryne Hitch, MD;  Location: [facility];  Service: Orthopedics;  Laterality: Left;   Patient Active Problem List   Diagnosis Date Noted   Status post total left knee replacement 04/21/2023   High risk medication use 10/28/2021   Olecranon bursitis, left elbow 04/21/2021   Personal history of COVID-19  04/13/2021   Primary osteoarthritis, 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   Essential hypertension 06/04/2020   Pure hypercholesterolemia 06/04/2020   CAD in native artery 06/04/2020   Parkinson's disease (HCC) 06/04/2020   Diabetes mellitus type 2 in nonobese (HCC) 06/04/2020   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   Iron deficiency anemia 11/14/2019   Unilateral primary osteoarthritis, left knee 04/01/2019   Chronic left-sided low back pain with left-sided sciatica 04/01/2019   Lumbar radiculopathy 08/21/2017   Chronic pain syndrome 08/04/2017   Facet arthropathy 08/04/2017   Lumbar paraspinal muscle spasm 08/04/2017   Anemia of chronic disease 07/03/2017   Dizziness 11/10/2016   Hyperlipidemia, unspecified 10/27/2016   Other intervertebral disc degeneration, lumbar region 06/30/2016   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 heart with stable angina pectoris (HCC) 02/26/2016   SOB (shortness of breath) 02/26/2016   Old MI (myocardial infarction) 02/26/2016   OAB (overactive bladder) 09/05/2013   Ischemic heart disease 09/21/2012   Bell's palsy 06/14/2012    ONSET DATE: several years  REFERRING DIAG:  R26.81 (ICD-10-CM) - Unsteadiness on feet  M62.81 (ICD-10-CM) - Muscle weakness (generalized)  G20.B2 (ICD-10-CM) - Parkinson's disease with dyskinesia and fluctuating manifestations (HCC)  R29.3 (ICD-10-CM) - Abnormal posture  THERAPY DIAG:  Muscle weakness (generalized)  Unsteadiness on feet  Other abnormalities of gait and mobility  Other symptoms and signs involving the nervous system  Parkinson's disease with dyskinesia and fluctuating manifestations (HCC)  Abnormal posture  Right foot drop  Rationale for Evaluation and Treatment: Rehabilitation  SUBJECTIVE:                                                                                                                                                                                             SUBJECTIVE  STATEMENT: Been going to the gym for general exercise.    Pt accompanied by: self  PERTINENT HISTORY: CAD, DM, HTN, hx of back surgery; sciatica, L TKA 04/21/23  PAIN:  Are you having pain? Yes: NPRS scale: 5/10 Pain location: R LB Pain description: aching, shooting Aggravating factors: prolonged standing, lifting >25 lbs Relieving factors: taking some weight off  PRECAUTIONS: Fall  RED FLAGS: None   WEIGHT BEARING RESTRICTIONS: No  FALLS: Has patient fallen in last 6 months? Yes. Number of falls reports at least a dozen  LIVING ENVIRONMENT: Lives with: lives with their spouse Lives in: House/apartment Stairs:  2nd story with 6 steps upstairs and downstairs; 1 step to enter  Has following equipment at home: Single point cane, Walker - 2 wheeled, and upright walker, shower seat  PLOF: Independent  PATIENT GOALS: strengthening my legs and arms so I can lift things without falling   OBJECTIVE:   TODAY'S TREATMENT: 01/04/24 Activity Comments  Dynamic balance x 2 min -sidestepping -retrowalk -tandem walk  Partial lunge 1x10   Lunge to stand 1x10 W/ EOM and seat of chair for UE, therapist placing/pulling pad for knee cushion  Suitcase deadlift 1x10 15#, split stance  Modified deadlift 1x10 15# on 4" box  Suitcase carry 15# x 65 ft Incr fatigue to back. To improve postural stability against perturbations  Multisensory static balance x 6 min                PATIENT EDUCATION: Education details: progress note details Person educated: Patient Education method: Explanation, Demonstration, Tactile cues, Verbal cues, and Handouts Education comprehension: verbalized understanding and returned demonstration  HOME EXERCISE PROGRAM: Access Code: [email or web address] URL: [email or web address] Date: 10/26/2023 Prepared by: Shary Decamp  Exercises - Sit to Stand in Stride with AFO and UE Assist in LE Alignment  - 1 x daily - 7 x weekly - 3-5 sets - 5 reps - Corner  Balance Feet Together With Eyes Open  - 1 x daily - 7 x weekly - 1-3 sets - 30 sec hold - Corner Balance Feet Together With Eyes Closed  -  1 x daily - 7 x weekly - 1-3 sets - 30 sec hold - Corner Balance Feet Together: Eyes Open With Head Turns  - 1 x daily - 7 x weekly - 3 sets - 3 reps - Corner Balance Feet Together: Eyes Closed With Head Turns  - 1 x daily - 7 x weekly - 3 sets - 3 reps - Standing Gastroc Stretch  - 1 x daily - 7 x weekly - 3 sets - 60 sec hold  Note: Objective measures were completed at Evaluation unless otherwise noted.  DIAGNOSTIC FINDINGS: none recent  COGNITION: Overall cognitive status: Within functional limits for tasks assessed   SENSATION: Pt reports diminished sensation in B LEs but testing intact to light touch  COORDINATION: Alternating pronation/supination: very slightly slowed  Alternating toe tap: reduced ROM on R  Finger to nose: slight intention tremor B  MUSCLE TONE: slightly increased tone in R quad   POSTURE: L lean sitting in chair, flexed posture   LOWER EXTREMITY ROM:     Active  Right Eval Left Eval  Hip flexion    Hip extension    Hip abduction    Hip adduction    Hip internal rotation    Hip external rotation    Knee flexion  106  Knee extension  2  Ankle dorsiflexion -9 1  Ankle plantarflexion    Ankle inversion    Ankle eversion     (Blank rows = not tested)  LOWER EXTREMITY MMT:    MMT (in sitting) Right Eval Left Eval  Hip flexion 4+ 5  Hip extension    Hip abduction 4 4+  Hip adduction 4 4+  Hip internal rotation    Hip external rotation    Knee flexion 4+ 4 *c/o muscle spasm in L toes  Knee extension 4+ 4+  Ankle dorsiflexion 2 4-  Ankle plantarflexion 2+ 4  Ankle inversion    Ankle eversion    (Blank rows = not tested)     GAIT: Gait pattern: audible R foot slap, R LE slightly scissoring, hip instability and imbalance  Assistive device utilized: Single point cane Level of assistance:  CGA   FUNCTIONAL TESTS:  42M walk: 18.03 sec (1.82 ft/sec) 5xSTS: 23.84 sec some retropulsion on 1st 2 reps                                                                                                                               TREATMENT DATE: 10/23/23      GOALS: Goals reviewed with patient? Yes  SHORT TERM GOALS: Target date: 11/20/2023  Patient to be independent with initial HEP. Baseline: HEP initiated Goal status: MET    LONG TERM GOALS: Target date: 02/12/2024    Patient to be independent with advanced HEP. Baseline:  Goal status: IN PROGRESS  Patient to score at least 46/56 on Berg in order to decrease risk of falls.   Baseline: 43/56; (12/18/23) 47/56  Goal status: MET  Patient to demonstrate safe ambulation with mod I with LRAD on indoor and outdoor surfaces.  Baseline: (12/18/23) modified independent w/ cane and right AFO various surfaces Goal status: MET  Patient to demonstrate 5xSTS test in <15 sec in order to decrease risk of falls.   Baseline: 5xSTS: 23.84 sec some retropulsion on 1st 2 reps; (12/18/23) 21 sec-good control Goal status: IN PROGRESS  Patient to verbalize understanding of fall prevention in home environment information. Baseline: independent teach-back Goal status: MET  Patient to verbalize tips to reduce freezing/festination with gait and turns. Baseline: teach-back of 4's, improved with R AFO but requires 4-6 steps for 180 degree turn Goal status: IN PROGRESS  -  Demo improved balance and reduced risk for falls per score 18/28 Mini-BESTest to improve safety with mobility  -  Baseline: 13/28  -  Goal status: INITIAL  -  Demo ability to maintain balance during golfing activities to enable return to leisure  -  Baseline: unable  -  Goal status: INITIAL    ASSESSMENT:  CLINICAL IMPRESSION: Iniitated with dynamic balance to facilitate loading response for single limb support. Theraputic activities to improve floor to stand mobility  performing partial lunges proceeding to full lunge to stand w/ UE support and therapist sequencing/guarding with attempts at reduced UE support but ultimately requiring BUE for successful lunge to stand posture.  Functional lifting with offset and midline resistance to improve safety with picking up items from floor progressing from modified set-up to ground-level but with LOB when loaded on the right.  Multisensory balance to enhance postural stability w/ ability to maintain min-mod sway eyes closed on foam x 20 sec before retro-LOB.  Continued sessions to progress POC details to improve mobility, reduce risk for falls, and reinforce strategies for freezing of gait.    OBJECTIVE IMPAIRMENTS: Abnormal gait, decreased balance, decreased coordination, difficulty walking, decreased ROM, decreased strength, decreased safety awareness, impaired flexibility, impaired tone, improper body mechanics, postural dysfunction, and pain.   ACTIVITY LIMITATIONS: carrying, lifting, bending, sitting, standing, squatting, sleeping, stairs, transfers, bed mobility, bathing, toileting, dressing, reach over head, hygiene/grooming, and locomotion level  PARTICIPATION LIMITATIONS: meal prep, cleaning, laundry, shopping, community activity, and church  PERSONAL FACTORS: Age, Past/current experiences, Time since onset of injury/illness/exacerbation, and 3+ comorbidities: CAD, DM, HTN, hx of back surgery; sciatica, L TKA 04/21/23  are also affecting patient's functional outcome.   REHAB POTENTIAL: Good  CLINICAL DECISION MAKING: Evolving/moderate complexity  EVALUATION COMPLEXITY: Moderate  PLAN:  PT FREQUENCY: 1-2x/week  PT DURATION: 8 weeks  PLANNED INTERVENTIONS: 97164- PT Re-evaluation, 97110-Therapeutic exercises, 97530- Therapeutic activity, 97112- Neuromuscular re-education, 97535- Self Care, 13244- Manual therapy, [phone]- Gait training, [phone]- Orthotic Fit/training, Patient/Family education, Balance training, Stair  training, Taping, Dry Needling, and DME instructions  PLAN FOR NEXT SESSION: golf activities, stool/cane for putting green for seated rest breaks (or modified rollator with club caddy)    10:55 AM, 01/04/24 M. Kelly Nevan Creighton, PT, DPT Physical Therapist- [place] Office Number: [phone]

## 2024-01-10 ENCOUNTER — Ambulatory Visit

## 2024-01-10 DIAGNOSIS — G20B2 Parkinson's disease with dyskinesia, with fluctuations: Secondary | ICD-10-CM

## 2024-01-10 DIAGNOSIS — R2689 Other abnormalities of gait and mobility: Secondary | ICD-10-CM

## 2024-01-10 DIAGNOSIS — R2681 Unsteadiness on feet: Secondary | ICD-10-CM

## 2024-01-10 DIAGNOSIS — M21371 Foot drop, right foot: Secondary | ICD-10-CM

## 2024-01-10 DIAGNOSIS — R29818 Other symptoms and signs involving the nervous system: Secondary | ICD-10-CM

## 2024-01-10 DIAGNOSIS — M6281 Muscle weakness (generalized): Secondary | ICD-10-CM | POA: Diagnosis not present

## 2024-01-10 DIAGNOSIS — R293 Abnormal posture: Secondary | ICD-10-CM

## 2024-01-10 NOTE — Therapy (Signed)
 OUTPATIENT PHYSICAL THERAPY NEURO TREATMENT,   Patient Name: Ronald [name] MRN: [id number] DOB:1951/08/20, 73 y.o., male Today's Date: 01/10/2024   PCP: Loree Roe, MD REFERRING PROVIDER: Debbra Fairy, MD     END OF SESSION:  PT End of Session - 01/10/24 1020     Visit Number 19    Number of Visits 24    Date for PT Re-Evaluation 02/12/24    Authorization Type [employer]    Authorization Time Period 9 visits from 3/31 - 5/26   auth submitted   Authorization - Visit Number 4    Authorization - Number of Visits 9    Progress Note Due on Visit 25    PT Start Time 1018    PT Stop Time 1100    PT Time Calculation (min) 42 min    Equipment Utilized During Treatment Gait belt    Activity Tolerance Patient tolerated treatment well    Behavior During Therapy WFL for tasks assessed/performed             Past Medical History:  Diagnosis Date   Anemia    Arthritis    Bell's palsy    CAD in native artery 06/04/2020   RCA infarct 2013.  S/p RCA and OM PCI   Diabetes mellitus type 2 in nonobese (HCC) 06/04/2020   Diabetes mellitus without complication (HCC)    Essential hypertension 06/04/2020   Floaters in visual field    Hypertension    Myocardial infarction [facility])    Parkinson's disease (HCC)    Parkinson's disease (HCC) 06/04/2020   Pure hypercholesterolemia 06/04/2020   Past Surgical History:  Procedure Laterality Date   APPENDECTOMY     back epidural   08/2017   BACK SURGERY     CORONARY ANGIOPLASTY WITH STENT PLACEMENT  08/19/2012   KNEE SURGERY     TOTAL KNEE ARTHROPLASTY Left 04/21/2023   Procedure: LEFT TOTAL KNEE ARTHROPLASTY;  Surgeon: Arnie Lao, MD;  Location: [facility];  Service: Orthopedics;  Laterality: Left;   Patient Active Problem List   Diagnosis Date Noted   Status post total left knee replacement 04/21/2023   High risk medication use 10/28/2021   Olecranon bursitis, left elbow 04/21/2021   Personal history of COVID-19  04/13/2021   Primary osteoarthritis, 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   Essential hypertension 06/04/2020   Pure hypercholesterolemia 06/04/2020   CAD in native artery 06/04/2020   Parkinson's disease (HCC) 06/04/2020   Diabetes mellitus type 2 in nonobese (HCC) 06/04/2020   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   Iron deficiency anemia 11/14/2019   Unilateral primary osteoarthritis, left knee 04/01/2019   Chronic left-sided low back pain with left-sided sciatica 04/01/2019   Lumbar radiculopathy 08/21/2017   Chronic pain syndrome 08/04/2017   Facet arthropathy 08/04/2017   Lumbar paraspinal muscle spasm 08/04/2017   Anemia of chronic disease 07/03/2017   Dizziness 11/10/2016   Hyperlipidemia, unspecified 10/27/2016   Other intervertebral disc degeneration, lumbar region 06/30/2016   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 heart with stable angina pectoris (HCC) 02/26/2016   SOB (shortness of breath) 02/26/2016   Old MI (myocardial infarction) 02/26/2016   OAB (overactive bladder) 09/05/2013   Ischemic heart disease 09/21/2012   Bell's palsy 06/14/2012    ONSET DATE: several years  REFERRING DIAG:  R26.81 (ICD-10-CM) - Unsteadiness on feet  M62.81 (ICD-10-CM) - Muscle weakness (generalized)  G20.B2 (ICD-10-CM) - Parkinson's disease with dyskinesia and fluctuating manifestations (HCC)  R29.3 (ICD-10-CM) - Abnormal posture  THERAPY DIAG:  Muscle weakness (generalized)  Unsteadiness on feet  Other abnormalities of gait and mobility  Other symptoms and signs involving the nervous system  Parkinson's disease with dyskinesia and fluctuating manifestations (HCC)  Abnormal posture  Right foot drop  Rationale for Evaluation and Treatment: Rehabilitation  SUBJECTIVE:                                                                                                                                                                                             SUBJECTIVE  STATEMENT: Been busy with MD appointments this week    Pt accompanied by: self  PERTINENT HISTORY: CAD, DM, HTN, hx of back surgery; sciatica, L TKA 04/21/23  PAIN:  Are you having pain? Yes: NPRS scale: 5/10 Pain location: R LB Pain description: aching, shooting Aggravating factors: prolonged standing, lifting >25 lbs Relieving factors: taking some weight off  PRECAUTIONS: Fall  RED FLAGS: None   WEIGHT BEARING RESTRICTIONS: No  FALLS: Has patient fallen in last 6 months? Yes. Number of falls reports at least a dozen  LIVING ENVIRONMENT: Lives with: lives with their spouse Lives in: House/apartment Stairs:  2nd story with 6 steps upstairs and downstairs; 1 step to enter  Has following equipment at home: Single point cane, Walker - 2 wheeled, and upright walker, shower seat  PLOF: Independent  PATIENT GOALS: strengthening my legs and arms so I can lift things without falling   OBJECTIVE:   TODAY'S TREATMENT: 01/10/24 Activity Comments  Sit to stand x 5 PWR UP x 10 Instances of retro-LOB  Modified deadlift 1x10 15#, 4" box  Suitcase split stance deadlift 15#  Standing kicking physioball x 2 min   116/74 mmHg, 60 bpm Reports feeling lightheaded  Standing chest pass physioball x 60 sec on firm/foam HR of 64 bpm despite exertion  Push-release all directions 50%      TODAY'S TREATMENT: 01/04/24 Activity Comments  Dynamic balance x 2 min -sidestepping -retrowalk -tandem walk  Partial lunge 1x10   Lunge to stand 1x10 W/ EOM and seat of chair for UE, therapist placing/pulling pad for knee cushion  Suitcase deadlift 1x10 15#, split stance  Modified deadlift 1x10 15# on 4" box  Suitcase carry 15# x 65 ft Incr fatigue to back. To improve postural stability against perturbations  Multisensory static balance x 6 min                PATIENT EDUCATION: Education details: progress note details Person educated: Patient Education method: Explanation, Demonstration,  Tactile cues, Verbal cues, and Handouts Education comprehension: verbalized understanding and returned demonstration  HOME EXERCISE PROGRAM: Access Code:  [email or web address] URL: [email or web address] Date: 10/26/2023 Prepared by: Tedd Favorite  Exercises - Sit to Stand in Stride with AFO and UE Assist in LE Alignment  - 1 x daily - 7 x weekly - 3-5 sets - 5 reps - Corner Balance Feet Together With Eyes Open  - 1 x daily - 7 x weekly - 1-3 sets - 30 sec hold - Corner Balance Feet Together With Eyes Closed  - 1 x daily - 7 x weekly - 1-3 sets - 30 sec hold - Corner Balance Feet Together: Eyes Open With Head Turns  - 1 x daily - 7 x weekly - 3 sets - 3 reps - Corner Balance Feet Together: Eyes Closed With Head Turns  - 1 x daily - 7 x weekly - 3 sets - 3 reps - Standing Gastroc Stretch  - 1 x daily - 7 x weekly - 3 sets - 60 sec hold - Modified Deadlift with Pelvic Contraction  - 1 x daily - 7 x weekly - 3 sets - 10 reps -Split-stance suitcase deadlift 3x10  Note: Objective measures were completed at Evaluation unless otherwise noted.  DIAGNOSTIC FINDINGS: none recent  COGNITION: Overall cognitive status: Within functional limits for tasks assessed   SENSATION: Pt reports diminished sensation in B LEs but testing intact to light touch  COORDINATION: Alternating pronation/supination: very slightly slowed  Alternating toe tap: reduced ROM on R  Finger to nose: slight intention tremor B  MUSCLE TONE: slightly increased tone in R quad   POSTURE: L lean sitting in chair, flexed posture   LOWER EXTREMITY ROM:     Active  Right Eval Left Eval  Hip flexion    Hip extension    Hip abduction    Hip adduction    Hip internal rotation    Hip external rotation    Knee flexion  106  Knee extension  2  Ankle dorsiflexion -9 1  Ankle plantarflexion    Ankle inversion    Ankle eversion     (Blank rows = not tested)  LOWER EXTREMITY MMT:    MMT (in sitting) Right Eval  Left Eval  Hip flexion 4+ 5  Hip extension    Hip abduction 4 4+  Hip adduction 4 4+  Hip internal rotation    Hip external rotation    Knee flexion 4+ 4 *c/o muscle spasm in L toes  Knee extension 4+ 4+  Ankle dorsiflexion 2 4-  Ankle plantarflexion 2+ 4  Ankle inversion    Ankle eversion    (Blank rows = not tested)     GAIT: Gait pattern: audible R foot slap, R LE slightly scissoring, hip instability and imbalance  Assistive device utilized: Single point cane Level of assistance: CGA   FUNCTIONAL TESTS:  29M walk: 18.03 sec (1.82 ft/sec) 5xSTS: 23.84 sec some retropulsion on 1st 2 reps  TREATMENT DATE: 10/23/23      GOALS: Goals reviewed with patient? Yes  SHORT TERM GOALS: Target date: 11/20/2023  Patient to be independent with initial HEP. Baseline: HEP initiated Goal status: MET    LONG TERM GOALS: Target date: 02/12/2024    Patient to be independent with advanced HEP. Baseline:  Goal status: IN PROGRESS  Patient to score at least 46/56 on Berg in order to decrease risk of falls.   Baseline: 43/56; (12/18/23) 47/56 Goal status: MET  Patient to demonstrate safe ambulation with mod I with LRAD on indoor and outdoor surfaces.  Baseline: (12/18/23) modified independent w/ cane and right AFO various surfaces Goal status: MET  Patient to demonstrate 5xSTS test in <15 sec in order to decrease risk of falls.   Baseline: 5xSTS: 23.84 sec some retropulsion on 1st 2 reps; (12/18/23) 21 sec-good control Goal status: IN PROGRESS  Patient to verbalize understanding of fall prevention in home environment information. Baseline: independent teach-back Goal status: MET  Patient to verbalize tips to reduce freezing/festination with gait and turns. Baseline: teach-back of 4's, improved with R AFO but requires 4-6 steps for 180 degree turn Goal  status: IN PROGRESS  -  Demo improved balance and reduced risk for falls per score 18/28 Mini-BESTest to improve safety with mobility  -  Baseline: 13/28  -  Goal status: INITIAL  -  Demo ability to maintain balance during golfing activities to enable return to leisure  -  Baseline: unable  -  Goal status: INITIAL    ASSESSMENT:  CLINICAL IMPRESSION: Continued with training for functional lifting to improve safety with lowering and retrieving items from ground and provided demonstratoin/strategy for improved postural stability in squat to enable safe retrieval of dog dish on floor with good carryover/stability exhibited. Dynamic standing balance to facilitate weight shifting and single limb support (kicking ball) and then static balance withstanding postural perturbations (throwing ball) on firm and compliant surfaces with retro-LOB on compliant 3 instances.  Facilitation of righting reactions with push-release demonstrating 50% success with overcoming LOB.  Continued sessions to would benefit to enhance functional mobility and provide with additional strategies and solutions to reduce risk for falls with mobility and ADL.    OBJECTIVE IMPAIRMENTS: Abnormal gait, decreased balance, decreased coordination, difficulty walking, decreased ROM, decreased strength, decreased safety awareness, impaired flexibility, impaired tone, improper body mechanics, postural dysfunction, and pain.   ACTIVITY LIMITATIONS: carrying, lifting, bending, sitting, standing, squatting, sleeping, stairs, transfers, bed mobility, bathing, toileting, dressing, reach over head, hygiene/grooming, and locomotion level  PARTICIPATION LIMITATIONS: meal prep, cleaning, laundry, shopping, community activity, and church  PERSONAL FACTORS: Age, Past/current experiences, Time since onset of injury/illness/exacerbation, and 3+ comorbidities: CAD, DM, HTN, hx of back surgery; sciatica, L TKA 04/21/23  are also affecting patient's  functional outcome.   REHAB POTENTIAL: Good  CLINICAL DECISION MAKING: Evolving/moderate complexity  EVALUATION COMPLEXITY: Moderate  PLAN:  PT FREQUENCY: 1-2x/week  PT DURATION: 8 weeks  PLANNED INTERVENTIONS: 97164- PT Re-evaluation, 97110-Therapeutic exercises, 97530- Therapeutic activity, 97112- Neuromuscular re-education, 97535- Self Care, 24401- Manual therapy, [phone]- Gait training, [phone]- Orthotic Fit/training, Patient/Family education, Balance training, Stair training, Taping, Dry Needling, and DME instructions  PLAN FOR NEXT SESSION: golf activities, stool/cane for putting green for seated rest breaks (or modified rollator with club caddy)    10:21 AM, 01/10/24 M. Kelly Ayvion Kavanagh, PT, DPT Physical Therapist-  Office Number: [phone]

## 2024-01-17 ENCOUNTER — Ambulatory Visit

## 2024-01-17 DIAGNOSIS — R2681 Unsteadiness on feet: Secondary | ICD-10-CM

## 2024-01-17 DIAGNOSIS — M6281 Muscle weakness (generalized): Secondary | ICD-10-CM | POA: Diagnosis not present

## 2024-01-17 DIAGNOSIS — R293 Abnormal posture: Secondary | ICD-10-CM

## 2024-01-17 DIAGNOSIS — M21371 Foot drop, right foot: Secondary | ICD-10-CM

## 2024-01-17 DIAGNOSIS — R2689 Other abnormalities of gait and mobility: Secondary | ICD-10-CM

## 2024-01-17 DIAGNOSIS — R29818 Other symptoms and signs involving the nervous system: Secondary | ICD-10-CM

## 2024-01-17 DIAGNOSIS — G20B2 Parkinson's disease with dyskinesia, with fluctuations: Secondary | ICD-10-CM

## 2024-01-17 NOTE — Therapy (Signed)
 OUTPATIENT PHYSICAL THERAPY NEURO TREATMENT,   Patient Name: Ronald [name] MRN: [id number] DOB:08-04-1951, 73 y.o., male Today's Date: 01/17/2024   PCP: Loree Roe, MD REFERRING PROVIDER: Debbra Fairy, MD     END OF SESSION:  PT End of Session - 01/17/24 1157     Visit Number 20    Number of Visits 24    Date for PT Re-Evaluation 02/12/24    Authorization Type [employer]    Authorization Time Period 9 visits from 3/31 - 5/26   auth submitted   Authorization - Visit Number 5    Authorization - Number of Visits 9    Progress Note Due on Visit 25    PT Start Time 1155   late arrival   PT Stop Time 1230    PT Time Calculation (min) 35 min    Equipment Utilized During Treatment Gait belt    Activity Tolerance Patient tolerated treatment well    Behavior During Therapy WFL for tasks assessed/performed             Past Medical History:  Diagnosis Date   Anemia    Arthritis    Bell's palsy    CAD in native artery 06/04/2020   RCA infarct 2013.  S/p RCA and OM PCI   Diabetes mellitus type 2 in nonobese (HCC) 06/04/2020   Diabetes mellitus without complication (HCC)    Essential hypertension 06/04/2020   Floaters in visual field    Hypertension    Myocardial infarction [facility])    Parkinson's disease (HCC)    Parkinson's disease (HCC) 06/04/2020   Pure hypercholesterolemia 06/04/2020   Past Surgical History:  Procedure Laterality Date   APPENDECTOMY     back epidural   08/2017   BACK SURGERY     CORONARY ANGIOPLASTY WITH STENT PLACEMENT  08/19/2012   KNEE SURGERY     TOTAL KNEE ARTHROPLASTY Left 04/21/2023   Procedure: LEFT TOTAL KNEE ARTHROPLASTY;  Surgeon: Arnie Lao, MD;  Location: [facility];  Service: Orthopedics;  Laterality: Left;   Patient Active Problem List   Diagnosis Date Noted   Status post total left knee replacement 04/21/2023   High risk medication use 10/28/2021   Olecranon bursitis, left elbow 04/21/2021   Personal history of  COVID-19 04/13/2021   Primary osteoarthritis, 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   Essential hypertension 06/04/2020   Pure hypercholesterolemia 06/04/2020   CAD in native artery 06/04/2020   Parkinson's disease (HCC) 06/04/2020   Diabetes mellitus type 2 in nonobese (HCC) 06/04/2020   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   Iron deficiency anemia 11/14/2019   Unilateral primary osteoarthritis, left knee 04/01/2019   Chronic left-sided low back pain with left-sided sciatica 04/01/2019   Lumbar radiculopathy 08/21/2017   Chronic pain syndrome 08/04/2017   Facet arthropathy 08/04/2017   Lumbar paraspinal muscle spasm 08/04/2017   Anemia of chronic disease 07/03/2017   Dizziness 11/10/2016   Hyperlipidemia, unspecified 10/27/2016   Other intervertebral disc degeneration, lumbar region 06/30/2016   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 heart with stable angina pectoris (HCC) 02/26/2016   SOB (shortness of breath) 02/26/2016   Old MI (myocardial infarction) 02/26/2016   OAB (overactive bladder) 09/05/2013   Ischemic heart disease 09/21/2012   Bell's palsy 06/14/2012    ONSET DATE: several years  REFERRING DIAG:  R26.81 (ICD-10-CM) - Unsteadiness on feet  M62.81 (ICD-10-CM) - Muscle weakness (generalized)  G20.B2 (ICD-10-CM) - Parkinson's disease with dyskinesia and fluctuating manifestations (HCC)  R29.3 (ICD-10-CM) - Abnormal posture  THERAPY DIAG:  Muscle weakness (generalized)  Unsteadiness on feet  Other abnormalities of gait and mobility  Other symptoms and signs involving the nervous system  Parkinson's disease with dyskinesia and fluctuating manifestations (HCC)  Abnormal posture  Right foot drop  Rationale for Evaluation and Treatment: Rehabilitation  SUBJECTIVE:                                                                                                                                                                                              SUBJECTIVE STATEMENT: Running late   Pt accompanied by: self  PERTINENT HISTORY: CAD, DM, HTN, hx of back surgery; sciatica, L TKA 04/21/23  PAIN:  Are you having pain? Yes: NPRS scale: 5/10 Pain location: R LB Pain description: aching, shooting Aggravating factors: prolonged standing, lifting >25 lbs Relieving factors: taking some weight off  PRECAUTIONS: Fall  RED FLAGS: None   WEIGHT BEARING RESTRICTIONS: No  FALLS: Has patient fallen in last 6 months? Yes. Number of falls reports at least a dozen  LIVING ENVIRONMENT: Lives with: lives with their spouse Lives in: House/apartment Stairs:  2nd story with 6 steps upstairs and downstairs; 1 step to enter  Has following equipment at home: Single point cane, Walker - 2 wheeled, and upright walker, shower seat  PLOF: Independent  PATIENT GOALS: strengthening my legs and arms so I can lift things without falling   OBJECTIVE:   TODAY'S TREATMENT: 01/17/24 Activity Comments  Resisted sit-stand 2x10 Blue band 22" seat height  Deadlift w/ blue band 2x10   Push-release all directions 50% successful righting reactions  Multisensory balance   Postural perturbations         TODAY'S TREATMENT: 01/10/24 Activity Comments  Sit to stand x 5 PWR UP x 10 Instances of retro-LOB  Modified deadlift 1x10 15#, 4" box  Suitcase split stance deadlift 15#  Standing kicking physioball x 2 min   116/74 mmHg, 60 bpm Reports feeling lightheaded  Standing chest pass physioball x 60 sec on firm/foam HR of 64 bpm despite exertion  Push-release all directions 50%      TODAY'S TREATMENT: 01/04/24 Activity Comments  Dynamic balance x 2 min -sidestepping -retrowalk -tandem walk  Partial lunge 1x10   Lunge to stand 1x10 W/ EOM and seat of chair for UE, therapist placing/pulling pad for knee cushion  Suitcase deadlift 1x10 15#, split stance  Modified deadlift 1x10 15# on 4" box  Suitcase carry 15# x 65 ft Incr fatigue to back. To improve  postural stability against perturbations  Multisensory static balance x 6 min  PATIENT EDUCATION: Education details: progress note details Person educated: Patient Education method: Explanation, Demonstration, Tactile cues, Verbal cues, and Handouts Education comprehension: verbalized understanding and returned demonstration  HOME EXERCISE PROGRAM: Access Code: [email or web address] URL: [email or web address] Date: 10/26/2023 Prepared by: Tedd Favorite  Exercises - Sit to Stand in Stride with AFO and UE Assist in LE Alignment  - 1 x daily - 7 x weekly - 3-5 sets - 5 reps - Corner Balance Feet Together With Eyes Open  - 1 x daily - 7 x weekly - 1-3 sets - 30 sec hold - Corner Balance Feet Together With Eyes Closed  - 1 x daily - 7 x weekly - 1-3 sets - 30 sec hold - Corner Balance Feet Together: Eyes Open With Head Turns  - 1 x daily - 7 x weekly - 3 sets - 3 reps - Corner Balance Feet Together: Eyes Closed With Head Turns  - 1 x daily - 7 x weekly - 3 sets - 3 reps - Standing Gastroc Stretch  - 1 x daily - 7 x weekly - 3 sets - 60 sec hold - Modified Deadlift with Pelvic Contraction  - 1 x daily - 7 x weekly - 3 sets - 10 reps -Split-stance suitcase deadlift 3x10 - Deadlift with Resistance  - 1 x daily - 7 x weekly - 3 sets - 10 reps  Note: Objective measures were completed at Evaluation unless otherwise noted.  DIAGNOSTIC FINDINGS: none recent  COGNITION: Overall cognitive status: Within functional limits for tasks assessed   SENSATION: Pt reports diminished sensation in B LEs but testing intact to light touch  COORDINATION: Alternating pronation/supination: very slightly slowed  Alternating toe tap: reduced ROM on R  Finger to nose: slight intention tremor B  MUSCLE TONE: slightly increased tone in R quad   POSTURE: L lean sitting in chair, flexed posture   LOWER EXTREMITY ROM:     Active  Right Eval Left Eval  Hip flexion    Hip extension     Hip abduction    Hip adduction    Hip internal rotation    Hip external rotation    Knee flexion  106  Knee extension  2  Ankle dorsiflexion -9 1  Ankle plantarflexion    Ankle inversion    Ankle eversion     (Blank rows = not tested)  LOWER EXTREMITY MMT:    MMT (in sitting) Right Eval Left Eval  Hip flexion 4+ 5  Hip extension    Hip abduction 4 4+  Hip adduction 4 4+  Hip internal rotation    Hip external rotation    Knee flexion 4+ 4 *c/o muscle spasm in L toes  Knee extension 4+ 4+  Ankle dorsiflexion 2 4-  Ankle plantarflexion 2+ 4  Ankle inversion    Ankle eversion    (Blank rows = not tested)     GAIT: Gait pattern: audible R foot slap, R LE slightly scissoring, hip instability and imbalance  Assistive device utilized: Single point cane Level of assistance: CGA   FUNCTIONAL TESTS:  86M walk: 18.03 sec (1.82 ft/sec) 5xSTS: 23.84 sec some retropulsion on 1st 2 reps  TREATMENT DATE: 10/23/23      GOALS: Goals reviewed with patient? Yes  SHORT TERM GOALS: Target date: 11/20/2023  Patient to be independent with initial HEP. Baseline: HEP initiated Goal status: MET    LONG TERM GOALS: Target date: 02/12/2024    Patient to be independent with advanced HEP. Baseline:  Goal status: IN PROGRESS  Patient to score at least 46/56 on Berg in order to decrease risk of falls.   Baseline: 43/56; (12/18/23) 47/56 Goal status: MET  Patient to demonstrate safe ambulation with mod I with LRAD on indoor and outdoor surfaces.  Baseline: (12/18/23) modified independent w/ cane and right AFO various surfaces Goal status: MET  Patient to demonstrate 5xSTS test in <15 sec in order to decrease risk of falls.   Baseline: 5xSTS: 23.84 sec some retropulsion on 1st 2 reps; (12/18/23) 21 sec-good control Goal status: IN PROGRESS  Patient to  verbalize understanding of fall prevention in home environment information. Baseline: independent teach-back Goal status: MET  Patient to verbalize tips to reduce freezing/festination with gait and turns. Baseline: teach-back of 4's, improved with R AFO but requires 4-6 steps for 180 degree turn Goal status: IN PROGRESS  -  Demo improved balance and reduced risk for falls per score 18/28 Mini-BESTest to improve safety with mobility  -  Baseline: 13/28  -  Goal status: INITIAL  -  Demo ability to maintain balance during golfing activities to enable return to leisure  -  Baseline: unable  -  Goal status: INITIAL    ASSESSMENT:  CLINICAL IMPRESSION: Compound resistance training to improve BLE strength as well as overcompensate for retropulsion. Push-release activities and postural perturbations to facilitate righting reactions and anticipatory response to improve motor control and reaction to LOB with approx 50% success with increased diffculty to retro-LOB.  Instructed in deadlift with band for HEP for progressing strentgh training. Continued sessions to advance POC details to meet LTG and reduce risk for falls.    OBJECTIVE IMPAIRMENTS: Abnormal gait, decreased balance, decreased coordination, difficulty walking, decreased ROM, decreased strength, decreased safety awareness, impaired flexibility, impaired tone, improper body mechanics, postural dysfunction, and pain.   ACTIVITY LIMITATIONS: carrying, lifting, bending, sitting, standing, squatting, sleeping, stairs, transfers, bed mobility, bathing, toileting, dressing, reach over head, hygiene/grooming, and locomotion level  PARTICIPATION LIMITATIONS: meal prep, cleaning, laundry, shopping, community activity, and church  PERSONAL FACTORS: Age, Past/current experiences, Time since onset of injury/illness/exacerbation, and 3+ comorbidities: CAD, DM, HTN, hx of back surgery; sciatica, L TKA 04/21/23  are also affecting patient's functional  outcome.   REHAB POTENTIAL: Good  CLINICAL DECISION MAKING: Evolving/moderate complexity  EVALUATION COMPLEXITY: Moderate  PLAN:  PT FREQUENCY: 1-2x/week  PT DURATION: 8 weeks  PLANNED INTERVENTIONS: 97164- PT Re-evaluation, 97110-Therapeutic exercises, 97530- Therapeutic activity, 97112- Neuromuscular re-education, 97535- Self Care, 40981- Manual therapy, [phone]- Gait training, [phone]- Orthotic Fit/training, Patient/Family education, Balance training, Stair training, Taping, Dry Needling, and DME instructions  PLAN FOR NEXT SESSION: golf activities, stool/cane for putting green for seated rest breaks (or modified rollator with club caddy)    11:58 AM, 01/17/24 M. Kelly Milam Allbaugh, PT, DPT Physical Therapist-  Office Number: [phone]

## 2024-01-24 ENCOUNTER — Ambulatory Visit: Attending: Neurology

## 2024-01-24 DIAGNOSIS — R2681 Unsteadiness on feet: Secondary | ICD-10-CM | POA: Diagnosis present

## 2024-01-24 DIAGNOSIS — M21371 Foot drop, right foot: Secondary | ICD-10-CM

## 2024-01-24 DIAGNOSIS — R2689 Other abnormalities of gait and mobility: Secondary | ICD-10-CM

## 2024-01-24 DIAGNOSIS — G20B2 Parkinson's disease with dyskinesia, with fluctuations: Secondary | ICD-10-CM | POA: Diagnosis present

## 2024-01-24 DIAGNOSIS — R29818 Other symptoms and signs involving the nervous system: Secondary | ICD-10-CM

## 2024-01-24 DIAGNOSIS — M6281 Muscle weakness (generalized): Secondary | ICD-10-CM | POA: Diagnosis present

## 2024-01-24 DIAGNOSIS — R293 Abnormal posture: Secondary | ICD-10-CM

## 2024-01-24 NOTE — Therapy (Signed)
 OUTPATIENT PHYSICAL THERAPY NEURO TREATMENT,   Patient Name: Ronald [name] MRN: [id number] DOB:02/22/1951, 73 y.o., male Today's Date: 01/24/2024   PCP: Loree Roe, MD REFERRING PROVIDER: Debbra Fairy, MD     END OF SESSION:  PT End of Session - 01/24/24 1113     Visit Number 21    Number of Visits 24    Date for PT Re-Evaluation 02/12/24    Authorization Type [employer]    Authorization Time Period 9 visits from 3/31 - 5/26   auth submitted   Authorization - Number of Visits 9    Progress Note Due on Visit 25    PT Start Time 1105    PT Stop Time 1145    PT Time Calculation (min) 40 min    Equipment Utilized During Treatment Gait belt    Activity Tolerance Patient tolerated treatment well    Behavior During Therapy WFL for tasks assessed/performed             Past Medical History:  Diagnosis Date   Anemia    Arthritis    Bell's palsy    CAD in native artery 06/04/2020   RCA infarct 2013.  S/p RCA and OM PCI   Diabetes mellitus type 2 in nonobese (HCC) 06/04/2020   Diabetes mellitus without complication (HCC)    Essential hypertension 06/04/2020   Floaters in visual field    Hypertension    Myocardial infarction [facility])    Parkinson's disease (HCC)    Parkinson's disease (HCC) 06/04/2020   Pure hypercholesterolemia 06/04/2020   Past Surgical History:  Procedure Laterality Date   APPENDECTOMY     back epidural   08/2017   BACK SURGERY     CORONARY ANGIOPLASTY WITH STENT PLACEMENT  08/19/2012   KNEE SURGERY     TOTAL KNEE ARTHROPLASTY Left 04/21/2023   Procedure: LEFT TOTAL KNEE ARTHROPLASTY;  Surgeon: Arnie Lao, MD;  Location: [facility];  Service: Orthopedics;  Laterality: Left;   Patient Active Problem List   Diagnosis Date Noted   Status post total left knee replacement 04/21/2023   High risk medication use 10/28/2021   Olecranon bursitis, left elbow 04/21/2021   Personal history of COVID-19 04/13/2021   Primary osteoarthritis,  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   Essential hypertension 06/04/2020   Pure hypercholesterolemia 06/04/2020   CAD in native artery 06/04/2020   Parkinson's disease (HCC) 06/04/2020   Diabetes mellitus type 2 in nonobese (HCC) 06/04/2020   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   Iron deficiency anemia 11/14/2019   Unilateral primary osteoarthritis, left knee 04/01/2019   Chronic left-sided low back pain with left-sided sciatica 04/01/2019   Lumbar radiculopathy 08/21/2017   Chronic pain syndrome 08/04/2017   Facet arthropathy 08/04/2017   Lumbar paraspinal muscle spasm 08/04/2017   Anemia of chronic disease 07/03/2017   Dizziness 11/10/2016   Hyperlipidemia, unspecified 10/27/2016   Other intervertebral disc degeneration, lumbar region 06/30/2016   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 heart with stable angina pectoris (HCC) 02/26/2016   SOB (shortness of breath) 02/26/2016   Old MI (myocardial infarction) 02/26/2016   OAB (overactive bladder) 09/05/2013   Ischemic heart disease 09/21/2012   Bell's palsy 06/14/2012    ONSET DATE: several years  REFERRING DIAG:  R26.81 (ICD-10-CM) - Unsteadiness on feet  M62.81 (ICD-10-CM) - Muscle weakness (generalized)  G20.B2 (ICD-10-CM) - Parkinson's disease with dyskinesia and fluctuating manifestations (HCC)  R29.3 (ICD-10-CM) - Abnormal posture    THERAPY DIAG:  Muscle weakness (generalized)  Unsteadiness  on feet  Other abnormalities of gait and mobility  Other symptoms and signs involving the nervous system  Parkinson's disease with dyskinesia and fluctuating manifestations (HCC)  Abnormal posture  Right foot drop  Rationale for Evaluation and Treatment: Rehabilitation  SUBJECTIVE:                                                                                                                                                                                             SUBJECTIVE STATEMENT: The back and leg feel like  it's getting worse, intend to make appointment with Dr. Omar Bibber   Pt accompanied by: self  PERTINENT HISTORY: CAD, DM, HTN, hx of back surgery; sciatica, L TKA 04/21/23  PAIN:  Are you having pain? Yes: NPRS scale: 3-4/10 Pain location: R LB and RLE Pain description: aching, shooting Aggravating factors: prolonged standing, lifting >25 lbs Relieving factors: taking some weight off  PRECAUTIONS: Fall  RED FLAGS: None   WEIGHT BEARING RESTRICTIONS: No  FALLS: Has patient fallen in last 6 months? Yes. Number of falls reports at least a dozen  LIVING ENVIRONMENT: Lives with: lives with their spouse Lives in: House/apartment Stairs:  2nd story with 6 steps upstairs and downstairs; 1 step to enter  Has following equipment at home: Single point cane, Walker - 2 wheeled, and upright walker, shower seat  PLOF: Independent  PATIENT GOALS: strengthening my legs and arms so I can lift things without falling   OBJECTIVE:   TODAY'S TREATMENT: 01/24/24 Activity Comments  Standing PWR moves 1x10 Frequent retro-LOB, especially in RLE loading positions  Multisensory balance training To improve postural stability  Pt education discussion Regarding compensatory and adaptive strategies for home life and mobility such as AD use, assist for home upkeep, activity pacing/energy conservation techniques, etc              TODAY'S TREATMENT: 01/17/24 Activity Comments  Resisted sit-stand 2x10 Blue band 22" seat height  Deadlift w/ blue band 2x10   Push-release all directions 50% successful righting reactions  Multisensory balance   Postural perturbations             PATIENT EDUCATION: Education details: progress note details Person educated: Patient Education method: Explanation, Demonstration, Tactile cues, Verbal cues, and Handouts Education comprehension: verbalized understanding and returned demonstration  HOME EXERCISE PROGRAM: Access Code: [email or web address] URL:  [email or web address] Date: 10/26/2023 Prepared by: Tedd Favorite  Exercises - Sit to Stand in Stride with AFO and UE Assist in LE Alignment  - 1 x daily - 7 x weekly - 3-5 sets - 5 reps - Corner Balance Feet Together With Eyes Open  - 1  x daily - 7 x weekly - 1-3 sets - 30 sec hold - Corner Balance Feet Together With Eyes Closed  - 1 x daily - 7 x weekly - 1-3 sets - 30 sec hold - Corner Balance Feet Together: Eyes Open With Head Turns  - 1 x daily - 7 x weekly - 3 sets - 3 reps - Corner Balance Feet Together: Eyes Closed With Head Turns  - 1 x daily - 7 x weekly - 3 sets - 3 reps - Standing Gastroc Stretch  - 1 x daily - 7 x weekly - 3 sets - 60 sec hold - Modified Deadlift with Pelvic Contraction  - 1 x daily - 7 x weekly - 3 sets - 10 reps -Split-stance suitcase deadlift 3x10 - Deadlift with Resistance  - 1 x daily - 7 x weekly - 3 sets - 10 reps  Note: Objective measures were completed at Evaluation unless otherwise noted.  DIAGNOSTIC FINDINGS: none recent  COGNITION: Overall cognitive status: Within functional limits for tasks assessed   SENSATION: Pt reports diminished sensation in B LEs but testing intact to light touch  COORDINATION: Alternating pronation/supination: very slightly slowed  Alternating toe tap: reduced ROM on R  Finger to nose: slight intention tremor B  MUSCLE TONE: slightly increased tone in R quad   POSTURE: L lean sitting in chair, flexed posture   LOWER EXTREMITY ROM:     Active  Right Eval Left Eval  Hip flexion    Hip extension    Hip abduction    Hip adduction    Hip internal rotation    Hip external rotation    Knee flexion  106  Knee extension  2  Ankle dorsiflexion -9 1  Ankle plantarflexion    Ankle inversion    Ankle eversion     (Blank rows = not tested)  LOWER EXTREMITY MMT:    MMT (in sitting) Right Eval Left Eval  Hip flexion 4+ 5  Hip extension    Hip abduction 4 4+  Hip adduction 4 4+  Hip internal  rotation    Hip external rotation    Knee flexion 4+ 4 *c/o muscle spasm in L toes  Knee extension 4+ 4+  Ankle dorsiflexion 2 4-  Ankle plantarflexion 2+ 4  Ankle inversion    Ankle eversion    (Blank rows = not tested)     GAIT: Gait pattern: audible R foot slap, R LE slightly scissoring, hip instability and imbalance  Assistive device utilized: Single point cane Level of assistance: CGA   FUNCTIONAL TESTS:  36M walk: 18.03 sec (1.82 ft/sec) 5xSTS: 23.84 sec some retropulsion on 1st 2 reps                                                                                                                               TREATMENT DATE: 10/23/23      GOALS: Goals reviewed with patient? Yes  SHORT TERM GOALS: Target date: 11/20/2023  Patient to be independent with initial HEP. Baseline: HEP initiated Goal status: MET    LONG TERM GOALS: Target date: 02/12/2024    Patient to be independent with advanced HEP. Baseline:  Goal status: IN PROGRESS  Patient to score at least 46/56 on Berg in order to decrease risk of falls.   Baseline: 43/56; (12/18/23) 47/56 Goal status: MET  Patient to demonstrate safe ambulation with mod I with LRAD on indoor and outdoor surfaces.  Baseline: (12/18/23) modified independent w/ cane and right AFO various surfaces Goal status: MET  Patient to demonstrate 5xSTS test in <15 sec in order to decrease risk of falls.   Baseline: 5xSTS: 23.84 sec some retropulsion on 1st 2 reps; (12/18/23) 21 sec-good control Goal status: IN PROGRESS  Patient to verbalize understanding of fall prevention in home environment information. Baseline: independent teach-back Goal status: MET  Patient to verbalize tips to reduce freezing/festination with gait and turns. Baseline: teach-back of 4's, improved with R AFO but requires 4-6 steps for 180 degree turn Goal status: IN PROGRESS  -  Demo improved balance and reduced risk for falls per score 18/28 Mini-BESTest to  improve safety with mobility  -  Baseline: 13/28  -  Goal status: INITIAL  -  Demo ability to maintain balance during golfing activities to enable return to leisure  -  Baseline: unable  -  Goal status: INITIAL    ASSESSMENT:  CLINICAL IMPRESSION: Pt reports having off-day and reports increasing frustration related to his low back and RLE weakness.  He does exhibit significant atrophy of his right tibialis anterior and lateral compartment mm.  Discussed benefit of reaching out to MD for further evaluation of his lumbar spine and radiculopathy symptoms.  Demonstrates increased difficulty with standing balance today with multiple episodes of retro-LOB requiring BUE support to correct.  Discussed strategies for self-care and home mgmt to improve safety.    OBJECTIVE IMPAIRMENTS: Abnormal gait, decreased balance, decreased coordination, difficulty walking, decreased ROM, decreased strength, decreased safety awareness, impaired flexibility, impaired tone, improper body mechanics, postural dysfunction, and pain.   ACTIVITY LIMITATIONS: carrying, lifting, bending, sitting, standing, squatting, sleeping, stairs, transfers, bed mobility, bathing, toileting, dressing, reach over head, hygiene/grooming, and locomotion level  PARTICIPATION LIMITATIONS: meal prep, cleaning, laundry, shopping, community activity, and church  PERSONAL FACTORS: Age, Past/current experiences, Time since onset of injury/illness/exacerbation, and 3+ comorbidities: CAD, DM, HTN, hx of back surgery; sciatica, L TKA 04/21/23  are also affecting patient's functional outcome.   REHAB POTENTIAL: Good  CLINICAL DECISION MAKING: Evolving/moderate complexity  EVALUATION COMPLEXITY: Moderate  PLAN:  PT FREQUENCY: 1-2x/week  PT DURATION: 8 weeks  PLANNED INTERVENTIONS: 97164- PT Re-evaluation, 97110-Therapeutic exercises, 97530- Therapeutic activity, 97112- Neuromuscular re-education, 97535- Self Care, 16109- Manual therapy,  [phone]- Gait training, [phone]- Orthotic Fit/training, Patient/Family education, Balance training, Stair training, Taping, Dry Needling, and DME instructions  PLAN FOR NEXT SESSION: golf activities, stool/cane for putting green for seated rest breaks (or modified rollator with club caddy)    11:14 AM, 01/24/24 M. Kelly Fallon Howerter, PT, DPT Physical Therapist- [place] Office Number: [phone]

## 2024-01-31 ENCOUNTER — Ambulatory Visit

## 2024-01-31 ENCOUNTER — Telehealth: Payer: Self-pay | Admitting: Neurology

## 2024-01-31 DIAGNOSIS — M21371 Foot drop, right foot: Secondary | ICD-10-CM

## 2024-01-31 DIAGNOSIS — M6281 Muscle weakness (generalized): Secondary | ICD-10-CM

## 2024-01-31 DIAGNOSIS — G20B2 Parkinson's disease with dyskinesia, with fluctuations: Secondary | ICD-10-CM

## 2024-01-31 DIAGNOSIS — R293 Abnormal posture: Secondary | ICD-10-CM

## 2024-01-31 DIAGNOSIS — R2689 Other abnormalities of gait and mobility: Secondary | ICD-10-CM

## 2024-01-31 DIAGNOSIS — R2681 Unsteadiness on feet: Secondary | ICD-10-CM

## 2024-01-31 DIAGNOSIS — R29818 Other symptoms and signs involving the nervous system: Secondary | ICD-10-CM

## 2024-01-31 NOTE — Telephone Encounter (Signed)
 Pt in office requesting to get an MRI ordered from Dr. Omar Bibber, states he hasn't gotten one in 5 years from his neck to his knees and has had many new issues especially in his back and would like for Dr. Omar Bibber to see the difference.

## 2024-01-31 NOTE — Telephone Encounter (Signed)
 I called pt back.  He is stating that he is having more falls, takes sinemet  8-9 tabs daily for PD.  Has brace for foot drop from PT.  Has mentioned about wanting MRI, from message he left earlier, but I relayed that we have never ordered a MRI , this would be something that would be dicussed with provider.  I moved up appt to tomorrow at 1000. Arrive 0945.

## 2024-01-31 NOTE — Therapy (Signed)
 OUTPATIENT PHYSICAL THERAPY NEURO TREATMENT,   Patient Name: Ronald [name] MRN: [id number] DOB:[birth year], 73 y.o., male Today's Date: 01/31/2024   PCP: Loree Roe, MD REFERRING PROVIDER: Debbra Fairy, MD     END OF SESSION:  PT End of Session - 01/31/24 1103     Visit Number 22    Number of Visits 24    Date for PT Re-Evaluation 02/12/24    Authorization Type [employer]    Authorization Time Period 9 visits from 3/31 - 5/26   auth submitted   Authorization - Visit Number 7    Authorization - Number of Visits 9    Progress Note Due on Visit 25    PT Start Time 1100    PT Stop Time 1145    PT Time Calculation (min) 45 min    Equipment Utilized During Treatment Gait belt    Activity Tolerance Patient tolerated treatment well    Behavior During Therapy WFL for tasks assessed/performed             Past Medical History:  Diagnosis Date   Anemia    Arthritis    Bell's palsy    CAD in native artery 06/04/2020   RCA infarct 2013.  S/p RCA and OM PCI   Diabetes mellitus type 2 in nonobese (HCC) 06/04/2020   Diabetes mellitus without complication (HCC)    Essential hypertension 06/04/2020   Floaters in visual field    Hypertension    Myocardial infarction [facility])    Parkinson's disease (HCC)    Parkinson's disease (HCC) 06/04/2020   Pure hypercholesterolemia 06/04/2020   Past Surgical History:  Procedure Laterality Date   APPENDECTOMY     back epidural   08/2017   BACK SURGERY     CORONARY ANGIOPLASTY WITH STENT PLACEMENT  08/19/2012   KNEE SURGERY     TOTAL KNEE ARTHROPLASTY Left 04/21/2023   Procedure: LEFT TOTAL KNEE ARTHROPLASTY;  Surgeon: Arnie Lao, MD;  Location: [facility];  Service: Orthopedics;  Laterality: Left;   Patient Active Problem List   Diagnosis Date Noted   Status post total left knee replacement 04/21/2023   High risk medication use 10/28/2021   Olecranon bursitis, left elbow 04/21/2021   Personal history of COVID-19  04/13/2021   Primary osteoarthritis, 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   Essential hypertension 06/04/2020   Pure hypercholesterolemia 06/04/2020   CAD in native artery 06/04/2020   Parkinson's disease (HCC) 06/04/2020   Diabetes mellitus type 2 in nonobese (HCC) 06/04/2020   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   Iron deficiency anemia 11/14/2019   Unilateral primary osteoarthritis, left knee 04/01/2019   Chronic left-sided low back pain with left-sided sciatica 04/01/2019   Lumbar radiculopathy 08/21/2017   Chronic pain syndrome 08/04/2017   Facet arthropathy 08/04/2017   Lumbar paraspinal muscle spasm 08/04/2017   Anemia of chronic disease 07/03/2017   Dizziness 11/10/2016   Hyperlipidemia, unspecified 10/27/2016   Other intervertebral disc degeneration, lumbar region 06/30/2016   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 heart with stable angina pectoris (HCC) 02/26/2016   SOB (shortness of breath) 02/26/2016   Old MI (myocardial infarction) 02/26/2016   OAB (overactive bladder) 09/05/2013   Ischemic heart disease 09/21/2012   Bell's palsy 06/14/2012    ONSET DATE: several years  REFERRING DIAG:  R26.81 (ICD-10-CM) - Unsteadiness on feet  M62.81 (ICD-10-CM) - Muscle weakness (generalized)  G20.B2 (ICD-10-CM) - Parkinson's disease with dyskinesia and fluctuating manifestations (HCC)  R29.3 (ICD-10-CM) - Abnormal posture  THERAPY DIAG:  Muscle weakness (generalized)  Unsteadiness on feet  Other abnormalities of gait and mobility  Other symptoms and signs involving the nervous system  Parkinson's disease with dyskinesia and fluctuating manifestations (HCC)  Abnormal posture  Right foot drop  Rationale for Evaluation and Treatment: Rehabilitation  SUBJECTIVE:                                                                                                                                                                                             SUBJECTIVE  STATEMENT: No new issues   Pt accompanied by: self  PERTINENT HISTORY: CAD, DM, HTN, hx of back surgery; sciatica, L TKA 04/21/23  PAIN:  Are you having pain? Yes: NPRS scale: 3-4/10 Pain location: R LB and RLE Pain description: aching, shooting Aggravating factors: prolonged standing, lifting >25 lbs Relieving factors: taking some weight off  PRECAUTIONS: Fall  RED FLAGS: None   WEIGHT BEARING RESTRICTIONS: No  FALLS: Has patient fallen in last 6 months? Yes. Number of falls reports at least a dozen  LIVING ENVIRONMENT: Lives with: lives with their spouse Lives in: House/apartment Stairs: 2nd story with 6 steps upstairs and downstairs; 1 step to enter  Has following equipment at home: Single point cane, Walker - 2 wheeled, and upright walker, shower seat  PLOF: Independent  PATIENT GOALS: strengthening my legs and arms so I can lift things without falling   OBJECTIVE:    TODAY'S TREATMENT: 01/31/24 Activity Comments  Seated unilat row 1x20 15#  Seated row w/ bar 1x12-15 25#, 35#, 45#  Step-up with knee drive 1O10 8" step w/ HR to improve stair ambulation  Lateral step-up 2x10 8" step w/ HR to improve safety with getting in/out of car  Seated hamstring curls   LAQ 3x10 10#  Alt stair taps 2x10 10#, 8" stair         PATIENT EDUCATION: Education details: progress note details Person educated: Patient Education method: Explanation, Demonstration, Tactile cues, Verbal cues, and Handouts Education comprehension: verbalized understanding and returned demonstration  HOME EXERCISE PROGRAM: Access Code: [email or web address] URL: [email or web address] Date: 10/26/2023 Prepared by: Tedd Favorite  Exercises - Sit to Stand in Stride with AFO and UE Assist in LE Alignment  - 1 x daily - 7 x weekly - 3-5 sets - 5 reps - Corner Balance Feet Together With Eyes Open  - 1 x daily - 7 x weekly - 1-3 sets - 30 sec hold - Corner Balance Feet Together With Eyes Closed  - 1 x  daily - 7 x weekly - 1-3 sets - 30 sec hold - Corner Balance Feet Together: Eyes  Open With Head Turns  - 1 x daily - 7 x weekly - 3 sets - 3 reps - Corner Balance Feet Together: Eyes Closed With Head Turns  - 1 x daily - 7 x weekly - 3 sets - 3 reps - Standing Gastroc Stretch  - 1 x daily - 7 x weekly - 3 sets - 60 sec hold - Modified Deadlift with Pelvic Contraction  - 1 x daily - 7 x weekly - 3 sets - 10 reps -Split-stance suitcase deadlift 3x10 - Deadlift with Resistance  - 1 x daily - 7 x weekly - 3 sets - 10 reps  Note: Objective measures were completed at Evaluation unless otherwise noted.  DIAGNOSTIC FINDINGS: none recent  COGNITION: Overall cognitive status: Within functional limits for tasks assessed   SENSATION: Pt reports diminished sensation in B LEs but testing intact to light touch  COORDINATION: Alternating pronation/supination: very slightly slowed  Alternating toe tap: reduced ROM on R  Finger to nose: slight intention tremor B  MUSCLE TONE: slightly increased tone in R quad   POSTURE: L lean sitting in chair, flexed posture   LOWER EXTREMITY ROM:     Active  Right Eval Left Eval  Hip flexion    Hip extension    Hip abduction    Hip adduction    Hip internal rotation    Hip external rotation    Knee flexion  106  Knee extension  2  Ankle dorsiflexion -9 1  Ankle plantarflexion    Ankle inversion    Ankle eversion     (Blank rows = not tested)  LOWER EXTREMITY MMT:    MMT (in sitting) Right Eval Left Eval  Hip flexion 4+ 5  Hip extension    Hip abduction 4 4+  Hip adduction 4 4+  Hip internal rotation    Hip external rotation    Knee flexion 4+ 4 *c/o muscle spasm in L toes  Knee extension 4+ 4+  Ankle dorsiflexion 2 4-  Ankle plantarflexion 2+ 4  Ankle inversion    Ankle eversion    (Blank rows = not tested)     GAIT: Gait pattern: audible R foot slap, R LE slightly scissoring, hip instability and imbalance  Assistive device  utilized: Single point cane Level of assistance: CGA   FUNCTIONAL TESTS:  96M walk: 18.03 sec (1.82 ft/sec) 5xSTS: 23.84 sec some retropulsion on 1st 2 reps                                                                                                                               TREATMENT DATE: 10/23/23      GOALS: Goals reviewed with patient? Yes  SHORT TERM GOALS: Target date: 11/20/2023  Patient to be independent with initial HEP. Baseline: HEP initiated Goal status: MET    LONG TERM GOALS: Target date: 02/12/2024    Patient to be independent with advanced HEP. Baseline:  Goal status:  IN PROGRESS  Patient to score at least 46/56 on [place] in order to decrease risk of falls.   Baseline: 43/56; (12/18/23) 47/56 Goal status: MET  Patient to demonstrate safe ambulation with mod I with LRAD on indoor and outdoor surfaces.  Baseline: (12/18/23) modified independent w/ cane and right AFO various surfaces Goal status: MET  Patient to demonstrate 5xSTS test in <15 sec in order to decrease risk of falls.   Baseline: 5xSTS: 23.84 sec some retropulsion on 1st 2 reps; (12/18/23) 21 sec-good control Goal status: IN PROGRESS  Patient to verbalize understanding of fall prevention in home environment information. Baseline: independent teach-back Goal status: MET  Patient to verbalize tips to reduce freezing/festination with gait and turns. Baseline: teach-back of 4's, improved with R AFO but requires 4-6 steps for 180 degree turn Goal status: IN PROGRESS  -  Demo improved balance and reduced risk for falls per score 18/28 Mini-BESTest to improve safety with mobility  -  Baseline: 13/28  -  Goal status: INITIAL  -  Demo ability to maintain balance during golfing activities to enable return to leisure  -  Baseline: unable  -  Goal status: INITIAL    ASSESSMENT:  CLINICAL IMPRESSION: Focus on improving upper and BLE strength/power to improve mobility and activity tolerance  and with emphasis on compound movements for gym-program development.  Continued sessions to advance POC details to improve mobilty and stability with carrying items   OBJECTIVE IMPAIRMENTS: Abnormal gait, decreased balance, decreased coordination, difficulty walking, decreased ROM, decreased strength, decreased safety awareness, impaired flexibility, impaired tone, improper body mechanics, postural dysfunction, and pain.   ACTIVITY LIMITATIONS: carrying, lifting, bending, sitting, standing, squatting, sleeping, stairs, transfers, bed mobility, bathing, toileting, dressing, reach over head, hygiene/grooming, and locomotion level  PARTICIPATION LIMITATIONS: meal prep, cleaning, laundry, shopping, community activity, and church  PERSONAL FACTORS: Age, Past/current experiences, Time since onset of injury/illness/exacerbation, and 3+ comorbidities: CAD, DM, HTN, hx of back surgery; sciatica, L TKA 04/21/23 are also affecting patient's functional outcome.   REHAB POTENTIAL: Good  CLINICAL DECISION MAKING: Evolving/moderate complexity  EVALUATION COMPLEXITY: Moderate  PLAN:  PT FREQUENCY: 1-2x/week  PT DURATION: 8 weeks  PLANNED INTERVENTIONS: 97164- PT Re-evaluation, 97110-Therapeutic exercises, 97530- Therapeutic activity, 97112- Neuromuscular re-education, 97535- Self Care, 96045- Manual therapy, [phone]- Gait training, [phone]- Orthotic Fit/training, Patient/Family education, Balance training, Stair training, Taping, Dry Needling, and DME instructions  PLAN FOR NEXT SESSION: golf activities, stool/cane for putting green for seated rest breaks (or modified rollator with club caddy)    11:04 AM, 01/31/24 M. Kelly Hristopher Missildine, PT, DPT Physical Therapist- [place] Office Number: [phone]

## 2024-02-01 ENCOUNTER — Ambulatory Visit (INDEPENDENT_AMBULATORY_CARE_PROVIDER_SITE_OTHER): Admitting: Adult Health

## 2024-02-01 ENCOUNTER — Encounter: Payer: Self-pay | Admitting: Adult Health

## 2024-02-01 VITALS — BP 136/76 | HR 64 | Ht 69.0 in | Wt 183.2 lb

## 2024-02-01 DIAGNOSIS — M545 Low back pain, unspecified: Secondary | ICD-10-CM | POA: Diagnosis not present

## 2024-02-01 DIAGNOSIS — G8929 Other chronic pain: Secondary | ICD-10-CM | POA: Diagnosis not present

## 2024-02-01 DIAGNOSIS — G20B2 Parkinson's disease with dyskinesia, with fluctuations: Secondary | ICD-10-CM | POA: Diagnosis not present
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
## 2024-02-05 NOTE — Patient Instructions (Signed)
 Your Plan:  Continue Sinemet   MRI lumbar spine WO contrast If your symptoms worsen or you develop new symptoms please let us  know or seek care at your local urgent care or ED.     Thank you for coming to see us  at [facility]. I hope we have been able to provide you high quality care today.  You may receive a patient satisfaction survey over the next few weeks. We would appreciate your feedback and comments so that we may continue to improve ourselves and the health of our patients.

## 2024-02-07 ENCOUNTER — Ambulatory Visit

## 2024-02-07 ENCOUNTER — Encounter: Payer: Self-pay | Admitting: Adult Health

## 2024-02-07 DIAGNOSIS — R2689 Other abnormalities of gait and mobility: Secondary | ICD-10-CM

## 2024-02-07 DIAGNOSIS — M6281 Muscle weakness (generalized): Secondary | ICD-10-CM | POA: Diagnosis not present

## 2024-02-07 DIAGNOSIS — M21371 Foot drop, right foot: Secondary | ICD-10-CM

## 2024-02-07 DIAGNOSIS — R2681 Unsteadiness on feet: Secondary | ICD-10-CM

## 2024-02-07 DIAGNOSIS — R29818 Other symptoms and signs involving the nervous system: Secondary | ICD-10-CM

## 2024-02-07 DIAGNOSIS — G20B2 Parkinson's disease with dyskinesia, with fluctuations: Secondary | ICD-10-CM

## 2024-02-07 DIAGNOSIS — R293 Abnormal posture: Secondary | ICD-10-CM

## 2024-02-07 NOTE — Therapy (Signed)
 OUTPATIENT PHYSICAL THERAPY NEURO TREATMENT,  D/C Summary  Patient Name: Ronald [name] MRN: [id number] DOB:[birth year], 73 y.o., male Today's Date: 02/07/2024   PCP: Loree Roe, MD REFERRING PROVIDER: Debbra Fairy, MD  PHYSICAL THERAPY DISCHARGE SUMMARY  Visits from Start of Care: 23  Current functional level related to goals / functional outcomes: Progressed with goals/outcome measures, see below for details   Remaining deficits: RLE weakness, balance deficits, right foot drop   Education / Equipment: HEP and community resources for those w/ PD   Patient agrees to discharge. Patient goals were partially met. Patient is being discharged due to being pleased with the current functional level.    END OF SESSION:  PT End of Session - 02/07/24 1107     Visit Number 23    Number of Visits 24    Date for PT Re-Evaluation 02/12/24    Authorization Type [employer]    Authorization Time Period 9 visits from 3/31 - 5/26   auth submitted   Authorization - Visit Number 8    Authorization - Number of Visits 9    Progress Note Due on Visit 25    PT Start Time 1107    PT Stop Time 1145    PT Time Calculation (min) 38 min    Equipment Utilized During Treatment Gait belt    Activity Tolerance Patient tolerated treatment well    Behavior During Therapy WFL for tasks assessed/performed             Past Medical History:  Diagnosis Date   Anemia    Arthritis    Bell's palsy    CAD in native artery 06/04/2020   RCA infarct 2013.  S/p RCA and OM PCI   Diabetes mellitus type 2 in nonobese (HCC) 06/04/2020   Diabetes mellitus without complication (HCC)    Essential hypertension 06/04/2020   Floaters in visual field    Hypertension    Myocardial infarction [facility])    Parkinson's disease (HCC)    Parkinson's disease (HCC) 06/04/2020   Pure hypercholesterolemia 06/04/2020   Past Surgical History:  Procedure Laterality Date   APPENDECTOMY     back epidural   08/2017    BACK SURGERY     CORONARY ANGIOPLASTY WITH STENT PLACEMENT  08/19/2012   KNEE SURGERY     TOTAL KNEE ARTHROPLASTY Left 04/21/2023   Procedure: LEFT TOTAL KNEE ARTHROPLASTY;  Surgeon: Arnie Lao, MD;  Location: [facility];  Service: Orthopedics;  Laterality: Left;   Patient Active Problem List   Diagnosis Date Noted   Status post total left knee replacement 04/21/2023   High risk medication use 10/28/2021   Olecranon bursitis, left elbow 04/21/2021   Personal history of COVID-19 04/13/2021   Primary osteoarthritis, 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   Essential hypertension 06/04/2020   Pure hypercholesterolemia 06/04/2020   CAD in native artery 06/04/2020   Parkinson's disease (HCC) 06/04/2020   Diabetes mellitus type 2 in nonobese (HCC) 06/04/2020   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   Iron deficiency anemia 11/14/2019   Unilateral primary osteoarthritis, left knee 04/01/2019   Chronic left-sided low back pain with left-sided sciatica 04/01/2019   Lumbar radiculopathy 08/21/2017   Chronic pain syndrome 08/04/2017   Facet arthropathy 08/04/2017   Lumbar paraspinal muscle spasm 08/04/2017   Anemia of chronic disease 07/03/2017   Dizziness 11/10/2016   Hyperlipidemia, unspecified 10/27/2016   Other intervertebral disc degeneration, lumbar region 06/30/2016   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 heart with stable angina  pectoris (HCC) 02/26/2016   SOB (shortness of breath) 02/26/2016   Old MI (myocardial infarction) 02/26/2016   OAB (overactive bladder) 09/05/2013   Ischemic heart disease 09/21/2012   Bell's palsy 06/14/2012    ONSET DATE: several years  REFERRING DIAG:  R26.81 (ICD-10-CM) - Unsteadiness on feet  M62.81 (ICD-10-CM) - Muscle weakness (generalized)  G20.B2 (ICD-10-CM) - Parkinson's disease with dyskinesia and fluctuating manifestations (HCC)  R29.3 (ICD-10-CM) - Abnormal posture    THERAPY DIAG:  Muscle weakness  (generalized)  Unsteadiness on feet  Other abnormalities of gait and mobility  Other symptoms and signs involving the nervous system  Parkinson's disease with dyskinesia and fluctuating manifestations (HCC)  Abnormal posture  Right foot drop  Rationale for Evaluation and Treatment: Rehabilitation  SUBJECTIVE:                                                                                                                                                                                             SUBJECTIVE STATEMENT: Got evaluated by neuro for the back/foot drop. Will be sending referral for imaging and f/u with spine specialist   Pt accompanied by: self  PERTINENT HISTORY: CAD, DM, HTN, hx of back surgery; sciatica, L TKA 04/21/23  PAIN:  Are you having pain? Yes: NPRS scale: 2/10 Pain location: R LB and RLE Pain description: aching, shooting Aggravating factors: prolonged standing, lifting >25 lbs Relieving factors: taking some weight off  PRECAUTIONS: Fall  RED FLAGS: None   WEIGHT BEARING RESTRICTIONS: No  FALLS: Has patient fallen in last 6 months? Yes. Number of falls reports at least a dozen  LIVING ENVIRONMENT: Lives with: lives with their spouse Lives in: House/apartment Stairs: 2nd story with 6 steps upstairs and downstairs; 1 step to enter  Has following equipment at home: Single point cane, Walker - 2 wheeled, and upright walker, shower seat  PLOF: Independent  PATIENT GOALS: strengthening my legs and arms so I can lift things without falling   OBJECTIVE:   TODAY'S TREATMENT: 02/07/24 Activity Comments  POC performance and review                      [facility] PT Assessment - 02/07/24 0001       Standardized Balance Assessment   Five times sit to stand comments  16 sec   hands on knees     Mini-BESTest   Sit To Stand Normal: Comes to stand without use of hands and stabilizes independently.    Rise to Toes < 3 s.    Stand on one leg (left)  Moderate: < 20 s    Stand  on one leg (right) Severe: Unable    Stand on one leg - lowest score 0    Compensatory Stepping Correction - Forward Normal: Recovers independently with a single, large step (second realignement is allowed).    Compensatory Stepping Correction - Backward Moderate: More than one step is required to recover equilibrium    Compensatory Stepping Correction - Left Lateral Moderate: Several steps to recover equilibrium    Compensatory Stepping Correction - Right Lateral Normal: Recovers independently with 1 step (crossover or lateral OK)    Stepping Corredtion Lateral - lowest score 1    Stance - Feet together, eyes open, firm surface  Normal: 30s    Stance - Feet together, eyes closed, foam surface  Moderate: < 30s    Incline - Eyes Closed Moderate: Stands independently < 30s OR aligns with surface    Change in Gait Speed Normal: Significantly changes walkling speed without imbalance    Walk with head turns - Horizontal Normal: performs head turns with no change in gait speed and good balance    Walk with pivot turns Moderate:Turns with feet close SLOW (>4 steps) with good balance.    Step over obstacles Normal: Able to step over box with minimal change of gait speed and with good balance.    Timed UP & GO with Dual Task Moderate: Dual Task affects either counting OR walking (>10%) when compared to the TUG without Dual Task.   regular: 9.56; cognitive: 11.38   Mini-BEST total score 18                  PATIENT EDUCATION: Education details: progress note details Person educated: Patient Education method: Explanation, Demonstration, Tactile cues, Verbal cues, and Handouts Education comprehension: verbalized understanding and returned demonstration  HOME EXERCISE PROGRAM: Access Code: [email or web address] URL: [email or web address] Date: 10/26/2023 Prepared by: Tedd Favorite  Exercises - Sit to Stand in Stride with AFO and UE Assist in LE Alignment  - 1 x  daily - 7 x weekly - 3-5 sets - 5 reps - Corner Balance Feet Together With Eyes Open  - 1 x daily - 7 x weekly - 1-3 sets - 30 sec hold - Corner Balance Feet Together With Eyes Closed  - 1 x daily - 7 x weekly - 1-3 sets - 30 sec hold - Corner Balance Feet Together: Eyes Open With Head Turns  - 1 x daily - 7 x weekly - 3 sets - 3 reps - Corner Balance Feet Together: Eyes Closed With Head Turns  - 1 x daily - 7 x weekly - 3 sets - 3 reps - Standing Gastroc Stretch  - 1 x daily - 7 x weekly - 3 sets - 60 sec hold - Modified Deadlift with Pelvic Contraction  - 1 x daily - 7 x weekly - 3 sets - 10 reps -Split-stance suitcase deadlift 3x10 - Deadlift with Resistance  - 1 x daily - 7 x weekly - 3 sets - 10 reps  Note: Objective measures were completed at Evaluation unless otherwise noted.  DIAGNOSTIC FINDINGS: none recent  COGNITION: Overall cognitive status: Within functional limits for tasks assessed   SENSATION: Pt reports diminished sensation in B LEs but testing intact to light touch  COORDINATION: Alternating pronation/supination: very slightly slowed  Alternating toe tap: reduced ROM on R  Finger to nose: slight intention tremor B  MUSCLE TONE: slightly increased tone in R quad   POSTURE: L lean sitting in chair, flexed posture   LOWER EXTREMITY  ROM:     Active  Right Eval Left Eval  Hip flexion    Hip extension    Hip abduction    Hip adduction    Hip internal rotation    Hip external rotation    Knee flexion  106  Knee extension  2  Ankle dorsiflexion -9 1  Ankle plantarflexion    Ankle inversion    Ankle eversion     (Blank rows = not tested)  LOWER EXTREMITY MMT:    MMT (in sitting) Right Eval Left Eval  Hip flexion 4+ 5  Hip extension    Hip abduction 4 4+  Hip adduction 4 4+  Hip internal rotation    Hip external rotation    Knee flexion 4+ 4 *c/o muscle spasm in L toes  Knee extension 4+ 4+  Ankle dorsiflexion 2 4-  Ankle plantarflexion 2+ 4   Ankle inversion    Ankle eversion    (Blank rows = not tested)     GAIT: Gait pattern: audible R foot slap, R LE slightly scissoring, hip instability and imbalance  Assistive device utilized: Single point cane Level of assistance: CGA   FUNCTIONAL TESTS:  37M walk: 18.03 sec (1.82 ft/sec) 5xSTS: 23.84 sec some retropulsion on 1st 2 reps                                                                                                                               TREATMENT DATE: 10/23/23      GOALS: Goals reviewed with patient? Yes  SHORT TERM GOALS: Target date: 11/20/2023  Patient to be independent with initial HEP. Baseline: HEP initiated Goal status: MET    LONG TERM GOALS: Target date: 02/12/2024    Patient to be independent with advanced HEP. Baseline:  Goal status: MET  Patient to score at least 46/56 on Berg in order to decrease risk of falls.   Baseline: 43/56; (12/18/23) 47/56 Goal status: MET  Patient to demonstrate safe ambulation with mod I with LRAD on indoor and outdoor surfaces.  Baseline: (12/18/23) modified independent w/ cane and right AFO various surfaces Goal status: MET  Patient to demonstrate 5xSTS test in <15 sec in order to decrease risk of falls.   Baseline: 5xSTS: 23.84 sec some retropulsion on 1st 2 reps; (12/18/23) 21 sec-good control; (02/07/24) 16 sec Goal status: NOT MET  Patient to verbalize understanding of fall prevention in home environment information. Baseline: independent teach-back Goal status: MET  Patient to verbalize tips to reduce freezing/festination with gait and turns. Baseline: teach-back of 4's, improved with R AFO but requires 4-6 steps for 180 degree turn; 8 steps for 360 deg Goal status: MET  -  Demo improved balance and reduced risk for falls per score 18/28 Mini-BESTest to improve safety with mobility  -  Baseline: 13/28; (02/07/24) 18/28  -  Goal status: MET  -  Demo ability to maintain balance during golfing  activities to enable  return to leisure  -  Baseline: unable  -  Goal status: NOT MET    ASSESSMENT:  CLINICAL IMPRESSION: POC performance and review with improved performance observed 5xSTS test with decreased time from 22 to 16 sec and minimal use of UE indicating improved BLE strength.  Mini-BESTest score improved form 13 to 18/28 exhibiting improved facilitation of righting reactions and improvement in gait mechanics now that he is using right AFO to accommodate right foot drop.  Pt reports continued compliance with HEP and stationary bicycling at home.  Will f/u in clinic for screening services in 6 months.    OBJECTIVE IMPAIRMENTS: Abnormal gait, decreased balance, decreased coordination, difficulty walking, decreased ROM, decreased strength, decreased safety awareness, impaired flexibility, impaired tone, improper body mechanics, postural dysfunction, and pain.   ACTIVITY LIMITATIONS: carrying, lifting, bending, sitting, standing, squatting, sleeping, stairs, transfers, bed mobility, bathing, toileting, dressing, reach over head, hygiene/grooming, and locomotion level  PARTICIPATION LIMITATIONS: meal prep, cleaning, laundry, shopping, community activity, and church  PERSONAL FACTORS: Age, Past/current experiences, Time since onset of injury/illness/exacerbation, and 3+ comorbidities: CAD, DM, HTN, hx of back surgery; sciatica, L TKA 04/21/23 are also affecting patient's functional outcome.   REHAB POTENTIAL: Good  CLINICAL DECISION MAKING: Evolving/moderate complexity  EVALUATION COMPLEXITY: Moderate  PLAN:  PT FREQUENCY: 1-2x/week  PT DURATION: 8 weeks  PLANNED INTERVENTIONS: 97164- PT Re-evaluation, 97110-Therapeutic exercises, 97530- Therapeutic activity, 97112- Neuromuscular re-education, 97535- Self Care, 16109- Manual therapy, [phone]- Gait training, [phone]- Orthotic Fit/training, Patient/Family education, Balance training, Stair training, Taping, Dry Needling, and DME  instructions  PLAN FOR NEXT SESSION: f/u in 6 months screen   11:09 AM, 02/07/24 M. Kelly Joniya Boberg, PT, DPT Physical Therapist- [place] Office Number: [phone]

## 2024-02-14 ENCOUNTER — Telehealth: Payer: Self-pay | Admitting: Adult Health

## 2024-02-14 NOTE — Telephone Encounter (Signed)
 [place] Rivers: [id number] exp. 02/14/24-04/12/24 sent to GI [phone]

## 2024-02-26 ENCOUNTER — Ambulatory Visit
Admission: RE | Admit: 2024-02-26 | Discharge: 2024-02-26 | Disposition: A | Discharge status: Home / Self Care | Source: Ambulatory Visit | Attending: Adult Health

## 2024-02-26 DIAGNOSIS — M545 Low back pain, unspecified: Secondary | ICD-10-CM | POA: Diagnosis not present

## 2024-02-26 DIAGNOSIS — G8929 Other chronic pain: Secondary | ICD-10-CM

## 2024-02-28 ENCOUNTER — Other Ambulatory Visit: Payer: Self-pay | Admitting: Adult Health

## 2024-02-29 ENCOUNTER — Ambulatory Visit: Payer: Self-pay | Admitting: Adult Health

## 2024-03-19 ENCOUNTER — Ambulatory Visit: Payer: Medicare HMO | Admitting: Adult Health

## 2024-03-28 ENCOUNTER — Other Ambulatory Visit (HOSPITAL_BASED_OUTPATIENT_CLINIC_OR_DEPARTMENT_OTHER): Payer: Self-pay | Admitting: Cardiovascular Disease

## 2024-04-02 ENCOUNTER — Telehealth: Payer: Self-pay

## 2024-04-02 NOTE — Telephone Encounter (Signed)
   Pre-operative Risk Assessment    Patient Name: Ronald [name]  DOB: 1950/10/17 MRN: [id number]   Date of last office visit: 07/03/2023 C. Vannie, NP Date of next office visit: NA   Request for Surgical Clearance    Procedure:  Bilateral Upper Eyelid Blepharoplasty  Date of Surgery:  Clearance 05/15/24                                 Surgeon:  Dr. Refugio Nanny Surgeon's Group or Practice Name:  [facility]  Phone number:  [phone] Fax number:  [phone]   Type of Clearance Requested:   - Medical  - Pharmacy:  Hold Aspirin  81mg    Type of Anesthesia:  Not Indicated   Additional requests/questions:    [name]   04/02/2024, 10:37 AM

## 2024-04-03 ENCOUNTER — Telehealth: Payer: Self-pay

## 2024-04-03 NOTE — Telephone Encounter (Signed)
  Patient Consent for Virtual Visit   {Ronald [name] has provided verbal consent on 04/03/2024 for a virtual visit (video or telephone).   CONSENT FOR VIRTUAL VISIT FOR:  Ronald [name]  By participating in this virtual visit I agree to the following:  I hereby voluntarily request, consent and authorize [facility] and its employed or contracted physicians, physician assistants, nurse practitioners or other licensed health care professionals (the Practitioner), to provide me with telemedicine health care services (the "Services) as deemed necessary by the treating Practitioner. I acknowledge and consent to receive the Services by the Practitioner via telemedicine. I understand that the telemedicine visit will involve communicating with the Practitioner through live audiovisual communication technology and the disclosure of certain medical information by electronic transmission. I acknowledge that I have been given the opportunity to request an in-person assessment or other available alternative prior to the telemedicine visit and am voluntarily participating in the telemedicine visit.  I understand that I have the right to withhold or withdraw my consent to the use of telemedicine in the course of my care at any time, without affecting my right to future care or treatment, and that the Practitioner or I may terminate the telemedicine visit at any time. I understand that I have the right to inspect all information obtained and/or recorded in the course of the telemedicine visit and may receive copies of available information for a reasonable fee.  I understand that some of the potential risks of receiving the Services via telemedicine include:  Delay or interruption in medical evaluation due to technological equipment failure or disruption; Information transmitted may not be sufficient (e.g. poor resolution of images) to allow for appropriate medical decision making by the Practitioner;  and/or  In rare instances, security protocols could fail, causing a breach of personal health information.  Furthermore, I acknowledge that it is my responsibility to provide information about my medical history, conditions and care that is complete and accurate to the best of my ability. I acknowledge that Practitioner's advice, recommendations, and/or decision may be based on factors not within their control, such as incomplete or inaccurate data provided by me or distortions of diagnostic images or specimens that may result from electronic transmissions. I understand that the practice of medicine is not an exact science and that Practitioner makes no warranties or guarantees regarding treatment outcomes. I acknowledge that a copy of this consent can be made available to me via my patient portal [facility] MyChart), or I can request a printed copy by calling the office of [facility].    I understand that my insurance will be billed for this visit.   I have read or had this consent read to me. I understand the contents of this consent, which adequately explains the benefits and risks of the Services being provided via telemedicine.  I have been provided ample opportunity to ask questions regarding this consent and the Services and have had my questions answered to my satisfaction. I give my informed consent for the services to be provided through the use of telemedicine in my medical care

## 2024-04-03 NOTE — Telephone Encounter (Signed)
 Patient agreeable with virtual telehealth appointment. Medication list and consent have both been reviewed. Patient voiced understanding.

## 2024-04-03 NOTE — Telephone Encounter (Signed)
   Name: Ronald [name]  DOB: Feb 12, 1951  MRN: [id number]  Primary Cardiologist: Annabella Scarce, MD   Preoperative team, please contact this patient and set up a phone call appointment for further preoperative risk assessment. Please obtain consent and complete medication review. Thank you for your help. Last seen by Reche Finder NP on 07/03/2023  I confirm that guidance regarding antiplatelet and oral anticoagulation therapy has been completed and, if necessary, noted below.  Per office protocol, if patient is without any new symptoms or concerns at the time of their virtual visit, he may hold aspirin  for 7 days prior to procedure. Please resume aspirin  as soon as possible postprocedure, at the discretion of the surgeon.    I also confirmed the patient resides in the state of [place] . As per [facility] Medical [facility] telemedicine laws, the patient must reside in the state in which the provider is licensed.   Lamarr Satterfield, NP 04/03/2024, 9:42 AM [facility]

## 2024-04-08 ENCOUNTER — Ambulatory Visit: Payer: Medicare HMO | Admitting: Adult Health

## 2024-04-15 ENCOUNTER — Ambulatory Visit: Admitting: Adult Health

## 2024-04-15 ENCOUNTER — Encounter: Payer: Self-pay | Admitting: Adult Health

## 2024-04-15 VITALS — BP 147/69 | HR 53 | Ht 68.0 in | Wt 183.0 lb

## 2024-04-15 DIAGNOSIS — M21371 Foot drop, right foot: Secondary | ICD-10-CM

## 2024-04-15 DIAGNOSIS — R296 Repeated falls: Secondary | ICD-10-CM

## 2024-04-15 DIAGNOSIS — G20B2 Parkinson's disease with dyskinesia, with fluctuations: Secondary | ICD-10-CM | POA: Diagnosis not present

## 2024-04-15 NOTE — Progress Notes (Signed)
 I reviewed the above note and documentation by the Nurse Practitioner and agree with the history, exam, assessment and plan as outlined above. I do recommend evaluation with ortho. I was available for consultation. True Mar, MD, PhD [facility])

## 2024-04-15 NOTE — Progress Notes (Addendum)
 PATIENT: Ronald [name] DOB: [birth year]  REASON FOR VISIT: follow up HISTORY FROM: patient PRIMARY NEUROLOGIST: Dr.Athar   Chief Complaint  Patient presents with   Follow-up    Pt in 20 alone Pt here for Parkinson f/u Pt states 4 falls in last six months Pt states difficulty with daily ADL'S      HISTORY OF PRESENT ILLNESS: Today 04/15/24:  Ronald [name] is a 73 y.o. male with a history of Parkinson's disease. Returns today for follow-up.  Overall he feels that he has been doing relatively well with the exception of falling.  He states that he has had 4 falls.  Fortunately no injuries.  All the falls occurred when he was not using his walker or cane.  He did do physical therapy and has a right AFO.  He reports that overall his tremor is mild.  He does have some trouble swallowing both liquid and food.  He uses caution when eating.  Not interested in doing a swallow evaluation.  We did do an MRI of the lumbar spine at the last visit that showed progressive degenerative changes.  He read his report on MyChart but did not see my messaging.  I reviewed the results with him.  Wife was on the phone briefly during her visit.  She did report concerns about the right foot drop and possibly in continuing exercising to improve the weakness.  We discussed physical therapy but patient deferred.  States that he will YouTube videos of exercising to do on his own.  MRI Lumbar Spine: MRI lumbar spine without contrast demonstrating: - Progressive multilevel degenerative spondylosis spanning from T12-L1 down to L5-S1 levels, with spinal stenosis and foraminal stenosis as above.  02/01/24: Ronald [name] is a 73 y.o. male with a history of parkinsons's disease. Returns today for follow-up.  He reports in regards to Parkinson's he has been doing well.  He reports a mild tremor at times.  At times he gets strangled with food or liquids.  Tends to drool at night.  His wife is on the phone today.  She  reports that he has been sleeping well.  He does get up frequently to go to the bathroom.  Occasionally he will act out his dreams but she reports this comes in waves.  At the last visit with Dr. Buck he was advised to try melatonin however the patient reports he has not tried this yet.  He has remained on Sinemet .  Today he is most concerned about right foot drop that started 3 to 4 months ago.  He has been working with physical therapy.  He is wearing a right AFO brace.  The patient does have extensive changes in the lumbar region.  He did have a laminectomy several years ago with Dr. Hillman through [facility].  He uses a cane when ambulating.  Reports that he has had some falls.  He does not contribute a certain fall or injury to weakness in the right foot and leg.  He has not followed up with orthopedics since Dr. Anderson retired.  Reports low back pain particularly on the right side that radiates to the buttocks.  Also reports that he will have some burning in tingling pain in the lower leg between the knee and the ankle on the right anterior portion of the leg   11/05/2022: He reports ongoing issues with low back pain.  He had an MRI of the lumbar spine through orthopedics recently and it showed multilevel degenerative changes including  neuroforaminal stenosis on multiple levels and lumbar spinal stenosis.  He had an epidural injection in the past.  Dr. Anderson retired last year.  He had left knee replacement with Dr. Vernetta in August 2024 and it is coming along okay but he has right knee pain.  He has fallen a few times, thankfully without any major injuries.  He uses a 2 wheeled walker inside the house and an upright walker outside the home to help alleviate some of the pressure in the lower back.  He has muscle spasms but is no longer on a muscle relaxer, he tried back send most recently.  He takes Aleve as needed.  He is not on any narcotic pain medication.  He has a tendency to be more  interactive now per wife.  He sits more.  He does not exercise as regularly.  He does have mild dyskinesias in the lower extremities.  He takes his levodopa  2 pills alternating with 1 pill every 3 hours starting at 9 AM.  Last 2 pills are at 9 PM.  Total dose is 8 pills/day.  Constipation is intermittent.  Ronald [name] is a 73 y.o. male who has been followed in this office for Parkinson's disease. Returns today for follow-up.  He reports that he has had more trouble with his gait and balance.  Has had approximately 6 falls.  Currently using a cane when ambulating.  Reports that it is rare that he has a tremor.  But he has noticed it in the upper extremities at times.  Reports that he is sleeping well.  His wife reports that sometimes he is acting out his dreams at night.  He has had 1 occasion that he fell out of bed.  He remains on Sinemet .  They may be interested in research program specifically asking about [employer].  HISTORY KATHLEN) 08/18/22: He reports reports feeling stable, still has back pain but tolerates it.  He uses a cane, has avoided using walker.  His birthday is tomorrow.  His wife is currently in [place], [place]  to be with her uncle who is 46 years old and in the hospital.  Her mom is also there currently.  Wife's uncle is going to be honored for being the first Geologist, engineering at [employer] in [place] , they are going to build a statue and put it on campus, unfortunately he is in the hospital right now. Patient has had some balance issues and stumbles, he has wondered about a walker but his wife has felt that he should not get a walker so he has avoided it.  His wife will need hip replacement surgery soon.  He continues to take Sinemet  1-1/2 pills 5 times a day, 3 hourly starting at 9 AM.  He would be willing to go to 2 pills alternating with 1 to avoid cutting the pills in half.  REVIEW OF SYSTEMS: Out of a complete 14 system review of symptoms, the  patient complains only of the following symptoms, and all other reviewed systems are negative.  ALLERGIES: Allergies  Allergen Reactions   Sulfamethoxazole-Trimethoprim Swelling    Pt stated his lips became swollen    HOME MEDICATIONS: Outpatient Medications Prior to Visit  Medication Sig Dispense Refill   aspirin  EC 81 MG tablet Take 81 mg by mouth daily. Swallow whole.     carbidopa -levodopa  (SINEMET  IR) 25-100 MG tablet TAKE 2 TABLETS AT 9AM, THEN TAKE ONE TAB AT 12PM NOON, AND TAKE 2 TABS AT 3PM,  THEN TAKE ONE TAB AT 6PM, AND TAKE 2 TABS AT 9PM 720 tablet 1   lisinopril  (ZESTRIL ) 5 MG tablet Take 1 tablet by mouth once daily 90 tablet 0   metoprolol  succinate (TOPROL -XL) 25 MG 24 hr tablet Take 25 mg by mouth daily.     naproxen sodium (ALEVE) 220 MG tablet Take 220 mg by mouth 2 (two) times daily as needed (pain). (Patient taking differently: Take 220 mg by mouth as needed (pain).)     rosuvastatin  (CRESTOR ) 20 MG tablet Take 1 tablet by mouth once daily 90 tablet 2   sertraline  (ZOLOFT ) 50 MG tablet Take 50 mg by mouth daily at 6 PM.     No facility-administered medications prior to visit.    PAST MEDICAL HISTORY: Past Medical History:  Diagnosis Date   Anemia    Arthritis    Bell's palsy    CAD in native artery 06/04/2020   RCA infarct 2013.  S/p RCA and OM PCI   Diabetes mellitus type 2 in nonobese (HCC) 06/04/2020   Diabetes mellitus without complication (HCC)    Essential hypertension 06/04/2020   Floaters in visual field    Hypertension    Myocardial infarction [facility])    Parkinson's disease (HCC)    Parkinson's disease (HCC) 06/04/2020   Pure hypercholesterolemia 06/04/2020    PAST SURGICAL HISTORY: Past Surgical History:  Procedure Laterality Date   APPENDECTOMY     back epidural   08/2017   BACK SURGERY     CORONARY ANGIOPLASTY WITH STENT PLACEMENT  08/19/2012   KNEE SURGERY     TOTAL KNEE ARTHROPLASTY Left 04/21/2023   Procedure: LEFT TOTAL KNEE  ARTHROPLASTY;  Surgeon: Vernetta Lonni GRADE, MD;  Location: [facility];  Service: Orthopedics;  Laterality: Left;    FAMILY HISTORY: Family History  Problem Relation Age of Onset   Diabetes Mother    Sarcoidosis Mother    Stroke Father    Parkinson's disease Neg Hx     SOCIAL HISTORY: Social History   Socioeconomic History   Marital status: Married    Spouse name: Kraig    Number of children: 3   Years of education: Not on file   Highest education level: Not on file  Occupational History   Occupation: Retired   Tobacco Use   Smoking status: Former   Smokeless tobacco: Never  Advertising account planner   Vaping status: Never Used  Substance and Sexual Activity   Alcohol use: Yes    Comment: occ glass of wine for a special occasion   Drug use: No   Sexual activity: Not on file  Other Topics Concern   Not on file  Social History Narrative   Drinks about 1 cup of coffee a day    Lives with wife Lonell   Left handed   Retired    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  (10/31/2023)   Received from [facility]   Overall Financial Resource Strain (CARDIA)    Difficulty of Paying Living Expenses: Not hard at all  Food Insecurity: No Food Insecurity (10/31/2023)   Received from [facility]   Hunger Vital Sign    Within the past 12 months, you worried that your food would run out before you got the money to buy more.: Never true    Within the past 12 months, the food you bought just didn't last and you didn't have money to get more.: Never true  Transportation Needs: No Transportation Needs (  10/31/2023)   Received from [facility]   PRAPARE - Transportation    Lack of Transportation (Medical): No    Lack of Transportation (Non-Medical): No  Physical Activity: Insufficiently Active (10/31/2023)   Received from [facility]   Exercise Vital Sign    On average, how many days per week do you engage in moderate to strenuous exercise (like a brisk walk)?: 2 days     On average, how many minutes do you engage in exercise at this level?: 40 min  Stress: No Stress Concern Present (10/31/2023)   Received from [facility] - Occupational Stress Questionnaire    Feeling of Stress : Only a little  Social Connections: Moderately Integrated (10/31/2023)   [facility] from [facility]   Social Network    How would you rate your social network (family, work, friends)?: Adequate participation with social networks  Intimate Partner Violence: Not At Risk (10/31/2023)   Received from [facility]   HITS    Over the last 12 months how often did your partner physically hurt you?: Never    Over the last 12 months how often did your partner insult you or talk down to you?: Never    Over the last 12 months how often did your partner threaten you with physical harm?: Never    Over the last 12 months how often did your partner scream or curse at you?: Never      PHYSICAL EXAM  Vitals:   04/15/24 0923  BP: (!) 147/69  Pulse: (!) 53  Weight: 183 lb (83 kg)  Height: 5' 8 (1.727 m)   Body mass index is 27.83 kg/m.  Generalized: Well developed, in no acute distress   Neurological examination  Mentation: Alert oriented to time, place, history taking. Follows all commands speech and language fluent Cranial nerve II-XII: Pupils were equal round reactive to light. Extraocular movements were full, visual field were full on confrontational test. Facial sensation and strength were normal. Uvula tongue midline. Head turning and shoulder shrug  were normal and symmetric. Motor: The motor testing reveals 5 over 5 strength BUE and LLE. Right foot drop.   Moderate impairment of finger taps and rapid alternating movement.  Sensory: Sensory testing is intact to soft touch on all 4 extremities. No evidence of extinction is noted.  Coordination: Cerebellar testing reveals good finger-nose-finger and heel-to-shin bilaterally.  Gait and  station: Patient uses a walker when ambulating.  Gait is slightly unsteady.  Right AFO brace. Reflexes: Deep tendon reflexes are symmetric but depressed in the lower extremities  DIAGNOSTIC DATA (LABS, IMAGING, TESTING) - I reviewed patient records, labs, notes, testing and imaging myself where available.      ASSESSMENT AND PLAN 73 y.o. year old male  has a past medical history of Anemia, Arthritis, Bell's palsy, CAD in native artery (06/04/2020), Diabetes mellitus type 2 in nonobese (HCC) (06/04/2020), Diabetes mellitus without complication (HCC), Essential hypertension (06/04/2020), Floaters in visual field, Hypertension, Myocardial infarction [facility]), Parkinson's disease (HCC), Parkinson's disease (HCC) (06/04/2020), and Pure hypercholesterolemia (06/04/2020). here with:  Parkinson's disease Right foot drop Degenerative changes in the lumbar spine  -Continue Sinemet  2 pills at 9 AM, 1 pill at 12 PM, 2 pills at 3 PM, 1 pill at 6 PM and 2 pills at 9 PM - Advised to stay well-hydrated - Reviewed imaging of the lumbar spine with him advised him to follow-up with orthopedics - MRI lumbar showed show left renal cyst- advised  to discuss with PCP - We discussed a swallow evaluation but patient deferred for now - Discussed physical therapy but the patient prefers to do his own exercising at home - Advised that certainly if symptoms worsen or he develops new symptoms he should let us  know - Follow-up in 6 months or sooner if needed    Duwaine Russell, MSN, NP-C 04/15/2024, 9:32 AM [facility] [address], [address] [place], [place] [address] [phone]  The patient's condition requires frequent monitoring and adjustments in the treatment plan, reflecting the ongoing complexity of care.  This provider is the continuing focal point for all needed services for this condition.

## 2024-04-29 ENCOUNTER — Ambulatory Visit: Attending: Internal Medicine

## 2024-04-29 DIAGNOSIS — Z01810 Encounter for preprocedural cardiovascular examination: Secondary | ICD-10-CM

## 2024-04-29 NOTE — Progress Notes (Signed)
   Patient Name: Ronald [name]  DOB: 13-Jun-1951 MRN: [id number]  Primary Cardiologist: Annabella Scarce, MD  Chart reviewed as part of pre-operative protocol coverage.   Patient was contacted today regarding bilateral upper eyelid blepharoplasty with Dr. Odella.  Patient reported that they have elected to cancel the procedure and are not planning to reschedule at this time.  Will no charge today's visit.  Informed patient that if they were to reschedule to please let us  know.  Patient appreciated the call.  Ankush Gintz D Dal Blew, NP 04/29/2024, 9:46 AM

## 2024-04-30 ENCOUNTER — Ambulatory Visit: Admitting: Adult Health

## 2024-05-29 ENCOUNTER — Encounter: Payer: Self-pay | Admitting: Adult Health

## 2024-05-29 ENCOUNTER — Telehealth: Payer: Self-pay | Admitting: Adult Health

## 2024-05-29 DIAGNOSIS — R296 Repeated falls: Secondary | ICD-10-CM

## 2024-05-29 DIAGNOSIS — M21371 Foot drop, right foot: Secondary | ICD-10-CM

## 2024-05-29 DIAGNOSIS — G20B2 Parkinson's disease with dyskinesia, with fluctuations: Secondary | ICD-10-CM

## 2024-05-29 NOTE — Telephone Encounter (Signed)
 Referral for neurology fax to [facility]

## 2024-05-29 NOTE — Telephone Encounter (Signed)
 From last note:

## 2024-05-30 ENCOUNTER — Telehealth: Payer: Self-pay | Admitting: Adult Health

## 2024-05-30 NOTE — Telephone Encounter (Signed)
 Referral for Physical Therapy  faxed to  [facility]  ([place])  [facility]  ([place]) Phone: [phone] Fax: [phone]

## 2024-07-11 ENCOUNTER — Other Ambulatory Visit: Payer: Self-pay | Admitting: Cardiovascular Disease

## 2024-07-16 ENCOUNTER — Other Ambulatory Visit (HOSPITAL_BASED_OUTPATIENT_CLINIC_OR_DEPARTMENT_OTHER): Payer: Self-pay | Admitting: Cardiovascular Disease

## 2024-07-22 ENCOUNTER — Encounter: Payer: Self-pay | Admitting: Radiology

## 2024-08-01 ENCOUNTER — Ambulatory Visit

## 2024-08-01 ENCOUNTER — Other Ambulatory Visit: Payer: Self-pay | Admitting: Cardiovascular Disease

## 2024-08-01 ENCOUNTER — Telehealth: Payer: Self-pay | Admitting: Neurology

## 2024-08-01 ENCOUNTER — Ambulatory Visit: Attending: Neurology | Admitting: Occupational Therapy

## 2024-08-01 ENCOUNTER — Telehealth: Payer: Self-pay | Admitting: Occupational Therapy

## 2024-08-01 ENCOUNTER — Ambulatory Visit: Admitting: Physical Therapy

## 2024-08-01 DIAGNOSIS — G20B2 Parkinson's disease with dyskinesia, with fluctuations: Secondary | ICD-10-CM

## 2024-08-01 DIAGNOSIS — R498 Other voice and resonance disorders: Secondary | ICD-10-CM

## 2024-08-01 DIAGNOSIS — R29818 Other symptoms and signs involving the nervous system: Secondary | ICD-10-CM | POA: Insufficient documentation

## 2024-08-01 DIAGNOSIS — R2689 Other abnormalities of gait and mobility: Secondary | ICD-10-CM

## 2024-08-01 DIAGNOSIS — R1310 Dysphagia, unspecified: Secondary | ICD-10-CM

## 2024-08-01 DIAGNOSIS — R279 Unspecified lack of coordination: Secondary | ICD-10-CM

## 2024-08-01 DIAGNOSIS — R471 Dysarthria and anarthria: Secondary | ICD-10-CM | POA: Insufficient documentation

## 2024-08-01 NOTE — Telephone Encounter (Signed)
 Referral to Neuro Rehabilitation  faxed to [facility] [place]     [facility] [place]  [phone] Fax:[phone]

## 2024-08-01 NOTE — Therapy (Signed)
 [facility] [address] [place], [place], [address] Phone: [phone]   Fax:  [phone]  Patient Details  Name: Ronald [name] MRN: [id number] Date of Birth: 1951/06/15 Referring Provider:  Buck Saucer, MD  Encounter Date: 08/01/2024  Speech Therapy Parkinson's Disease Screen   Decibel Level today: 68dB  (WNL=70-72 dB) with sound level meter 30cm away from pt's mouth. Pt's conversational volume is below WNL.  Pt does report difficulty with swallowing, which does warrant further evaluation  Pt does not endorse changes in cognition.   Pt would benefit from dysarthria evaluation and clinical swallow evaluation - please send referral to [facility] OP therapy services due to proximity to pt's home.   Domonique Brouillard, CCC-SLP 08/01/2024, 5:21 PM  [place] [facility] [address] [place], [place], [address] Phone: [phone]   Fax:  [phone]

## 2024-08-01 NOTE — Telephone Encounter (Signed)
 Referral placed to OT and ST at [facility] and [facility]SS ordered.

## 2024-08-01 NOTE — Therapy (Signed)
 [place] [facility] [address], [address] [place], [place], [address] Phone: [phone]   Fax:  [phone]  Patient Details  Name: Ronald [name] MRN: [id number] Date of Birth: 05-18-1951 Referring Provider:  Naomi Hitch, MD  Encounter Date: 08/01/2024  Occupational Therapy Parkinson's Disease Screen  Hand dominance:  left   Physical Performance Test item #2 (simulated eating):  16.75 sec  Physical Performance Test item #4 (donning/doffing jacket):  50.32 sec  Fastening/unfastening 3 buttons in:  53.50 sec  9-hole peg test:    RUE  37.0 sec        LUE  34.19 sec  Change in ability to perform ADLs/IADLs:  Pt reports difficulty with putting on socks, brushing teeth, shaving, getting jacket on/off.  Pt reports that he is slowing down.   Pt would benefit from occupational therapy evaluation due to reports of increased difficulty with aspects of ADLs and slowed measurements during simulated ADLs and coordination tasks above.    KAYLENE DOMINO, OT 08/01/2024, 12:05 PM  [place] [facility] [address] [place], [place], [address] Phone: [phone]   Fax:  [phone]

## 2024-08-01 NOTE — Telephone Encounter (Signed)
 Dr. Buck, Ronald [name] was seen for PT, OT, speech therapy screens in our multi-disciplinary clinic.  Occupational therapy is recommended for follow up due to reports of increased difficulty with aspects of ADLs and slowed measurements during simulated ADLs and coordination tasks.  Speech therapy is recommended for follow up due to low speech volume and an MBS is recommended due to difficulty during meals and with medicines.    If you agree, please send referral to [facility] as pt is currently receiving Physical therapy at their Outpatient center due to proximity to home.  Thank you.  Ronald [name], OT

## 2024-08-01 NOTE — Therapy (Signed)
 [place] [facility] [address] [place], [place], [address] Phone: [phone]   Fax:  [phone]  Patient Details  Name: Ronald [name] MRN: [id number] Date of Birth: Apr 11, 1951 Referring Provider:  Naomi Hitch, MD  Encounter Date: 08/01/2024  Physical Therapy Parkinson's Disease Screen-NO CHARGE   Spoke with patient after his OT and speech therapy screens.  He is currently being seen for PT at [facility] in [facility], which is closer to his home.  He will continue with that PT POC and therefore did not complete PT screen today.  Eva Vallee W., PT 08/01/2024, 12:00 PM  [place] [facility] [address] [place], [place], [address] Phone: [phone]   Fax:  [phone]

## 2024-08-01 NOTE — Telephone Encounter (Signed)
 Updated FAX #[phone]

## 2024-08-05 ENCOUNTER — Other Ambulatory Visit: Payer: Self-pay | Admitting: Cardiovascular Disease

## 2024-08-08 ENCOUNTER — Other Ambulatory Visit: Payer: Self-pay | Admitting: Cardiovascular Disease

## 2024-09-09 ENCOUNTER — Other Ambulatory Visit: Payer: Self-pay | Admitting: Cardiovascular Disease

## 2024-09-25 ENCOUNTER — Other Ambulatory Visit: Payer: Self-pay | Admitting: Cardiovascular Disease

## 2024-09-30 ENCOUNTER — Other Ambulatory Visit: Payer: Self-pay

## 2024-09-30 MED ORDER — CARBIDOPA-LEVODOPA 25-100 MG PO TABS: ORAL_TABLET | ORAL | 1 refills | Status: AC

## 2024-10-16 ENCOUNTER — Ambulatory Visit: Admitting: Neurology

## 2024-10-16 ENCOUNTER — Encounter: Payer: Self-pay | Admitting: Neurology

## 2024-10-16 VITALS — BP 101/61 | HR 67 | Ht 68.0 in | Wt 181.0 lb

## 2024-10-16 DIAGNOSIS — Z9181 History of falling: Secondary | ICD-10-CM | POA: Diagnosis not present

## 2024-10-16 DIAGNOSIS — G20B2 Parkinson's disease with dyskinesia, with fluctuations: Secondary | ICD-10-CM

## 2024-10-16 DIAGNOSIS — M19012 Primary osteoarthritis, left shoulder: Secondary | ICD-10-CM | POA: Diagnosis not present

## 2024-10-16 DIAGNOSIS — R296 Repeated falls: Secondary | ICD-10-CM | POA: Diagnosis not present

## 2024-10-16 DIAGNOSIS — M21371 Foot drop, right foot: Secondary | ICD-10-CM | POA: Diagnosis not present

## 2024-10-16 NOTE — Progress Notes (Signed)
 Subjective:    Patient ID: Ronald [name] is a 74 y.o. male.  HPI    True Mar, MD, PhD [facility] [address], 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  The patient is unaccompanied today.  He was last seen in our clinic by Duwaine Russell, NP in July 2025, at which time he reported more falls.  He was using a right AFO and had been in physical therapy.  He was doing exercises at home.  He was advised to continue with Sinemet  2 pills alternating with 1 pill for total dose of 8 pills/day.  He was advised to follow-up with orthopedics and PCP, MRI of the lumbar spine had shown a left renal cyst.  He declined a referral to physical therapy at the time.  Today, 10/16/2024: He reports a fall in November.  He fell outside as he was carrying a cardboard box to the trash.  He fell onto the box which in a way he braced his fall by not hitting the head on concrete.  He did not use his walker at the time and he is not sure if he was wearing his AFO at the time.  He did not have any sequelae, no residual headaches, no obvious lacerations.  He admits that he does not always hydrate well, he estimates that he drinks about 2 bottles worth of water per day on average.  He continues to take his Sinemet  on a scheduled basis about 3 hourly, 2 pills alternating with 1 pill for total of 5 doses and 8 pills/day.  He has had low back pain.  He had seen pain management and received a facet  injection into L4-5 and L5-S1 bilaterally on 09/10/2024 with bupivacaine .  He believes it helped.  He has left shoulder pain, has not seen orthopedics for this yet.   The patient's allergies, current medications, family history, past medical history, past social history, past surgical history and problem list were reviewed and updated as appropriate.    Previously (copied from previous notes for reference):    04/15/2024 (MM): <<Ronald [name] is a 74 y.o. male with a history of Parkinson's disease. Returns today for follow-up.  Overall he feels that he has been doing relatively well with the exception of falling.  He states that he has had 4 falls.  Fortunately no injuries.  All the falls occurred when he was not using his walker or cane.  He did do physical therapy and has a right AFO.  He reports that overall his tremor is mild.  He does have some trouble swallowing both liquid and food.  He uses caution when eating.  Not interested in doing a swallow evaluation.  We did do an MRI of the lumbar spine at the last visit that showed progressive degenerative changes.  He read his report on MyChart but did not see my messaging.  I reviewed the results with him.  Wife was on the phone briefly during her visit.  She did report concerns about the right foot drop and possibly in continuing exercising  to improve the weakness.  We discussed physical therapy but patient deferred.  States that he will YouTube videos of exercising to do on his own.   MRI Lumbar Spine: MRI lumbar spine without contrast demonstrating: - Progressive multilevel degenerative spondylosis spanning from T12-L1 down to L5-S1 levels, with spinal stenosis and foraminal stenosis as above.>>   02/01/24 (MM): <<Ronald [name] is a 74 y.o. male with a history of parkinsons's disease. Returns today for follow-up.  He reports in regards to Parkinson's he has been doing well.  He reports a mild tremor at times.  At times he gets strangled with  food or liquids.  Tends to drool at night.  His wife is on the phone today.  She reports that he has been sleeping well.  He does get up frequently to go to the bathroom.  Occasionally he will act out his dreams but she reports this comes in waves.  At the last visit with Dr. Buck he was advised to try melatonin however the patient reports he has not tried this yet.  He has remained on Sinemet .  Today he is most concerned about right foot drop that started 3 to 4 months ago.  He has been working with physical therapy.  He is wearing a right AFO brace.  The patient does have extensive changes in the lumbar region.  He did have a laminectomy several years ago with Dr. Hillman through [facility].  He uses a cane when ambulating.  Reports that he has had some falls.  He does not contribute a certain fall or injury to weakness in the right foot and leg.  He has not followed up with orthopedics since Dr. Anderson retired.  Reports low back pain particularly on the right side that radiates to the buttocks.  Also reports that he will have some burning in tingling pain in the lower leg between the knee and the ankle on the right anterior portion of the leg.>>    09/05/2023: He was last seen in our clinic by Duwaine Russell, NP on 03/01/2023, at which time he reported more trouble with his gait and balance.  He sustained some falls.  He was advised to continue with his C/L at 2 pills 5 times a day, stay well-hydrated and proceed with physical therapy.  A referral to PT was made.   He reports ongoing issues with low back pain.  He had an MRI of the lumbar spine through orthopedics recently and it showed multilevel degenerative changes including neuroforaminal stenosis on multiple levels and lumbar spinal stenosis.  He had an epidural injection in the past.  Dr. Anderson retired last year.  He had left knee replacement with Dr. Vernetta in August 2024 and it is coming along okay but he has right knee pain.  He has fallen a  few times, thankfully without any major injuries.  He uses a 2 wheeled walker inside the house and an upright walker outside the home to help alleviate some of the pressure in the lower back.  He has muscle spasms but is no longer on a muscle relaxer, he tried back send most recently.  He takes Aleve as needed.  He is not on any narcotic pain medication.  He has a tendency to be more interactive now per wife.  He sits more.  He does not exercise as regularly.  He does have mild dyskinesias in the lower extremities.  He takes his levodopa  2 pills alternating with 1 pill every 3 hours starting  at 9 AM.  Last 2 pills are at 9 PM.  Total dose is 8 pills/day.  Constipation is intermittent.     03/01/23 Ronald Russell, NP): <<Ronald [name] is a 74 y.o. male who has been followed in this office for Parkinson's disease. Returns today for follow-up.  He reports that he has had more trouble with his gait and balance.  Has had approximately 6 falls.  Currently using a cane when ambulating.  Reports that it is rare that he has a tremor.  But he has noticed it in the upper extremities at times.  Reports that he is sleeping well.  His wife reports that sometimes he is acting out his dreams at night.  He has had 1 occasion that he fell out of bed.  He remains on Sinemet .  They may be interested in research program specifically asking about [employer].>> 08/18/22: He reports reports feeling stable, still has back pain but tolerates it.  He uses a cane, has avoided using walker.  His birthday is tomorrow.  His wife is currently in [place], [place]  to be with her uncle who is 2 years old and in the hospital.  Her mom is also there currently.  Wife's uncle is going to be honored for being the first geologist, engineering at [employer] in  , they are going to build a statue and put it on campus, unfortunately he is in the hospital right now. Patient has had some balance issues and stumbles, he  has wondered about a walker but his wife has felt that he should not get a walker so he has avoided it.  His wife will need hip replacement surgery soon.  He continues to take Sinemet  1-1/2 pills 5 times a day, 3 hourly starting at 9 AM.  He would be willing to go to 2 pills alternating with 1 to avoid cutting the pills in half. I saw him on 02/10/2022, at which time he reported feeling fairly stable from the Parkinson's standpoint, he was taking Sinemet  1-1/2 pills 5 times a day but it was difficult to cut the pills in half.  He had a cystoscopy with Botox under urology on 01/21/2022, but still had significant nocturia about 4-5 times per average night.  Also had ongoing issues with low back pain.      I saw him on 10/07/2021, at which time he reported that increasing Sinemet  to 4 times a day caused his joint pain to be worse.  He was on 2 pills 3 times daily.  I suggested he change his Sinemet  regimen to 1-1/2 pills 5 times a day starting at 9 AM, 3 hourly. He had recently started ProAmantine per Cardiology.     I saw him on 06/03/2021, at which time he reported significant left knee pain.  He was utilizing a prescription knee brace.  He was using a cane.  He had a recent fall, landed on his back but did not hit his head and sustained no serious injuries thankfully.  Constipation was under decent control.  He was advised to increase his Sinemet  from 1-1/2 pills 4 times daily to 2 pills in the morning and 2 pills in the evening, maintain 1-1/2 pills for the midday doses.    I saw him on 01/27/2021, at which time he reported doing fairly well, he had ongoing issues with knee pain and low back pain.  He reported having fallen.  He had not used a walker and he was not keen  on starting to use a walker.  We talked about potentially utilizing Nourianz.     I saw him on 07/30/2020, at which time he reported feeling fairly stable.  He was taking Sinemet  2 pills 3 times daily.  He was trying to stay active and go to  the gym about twice a week.  He reported occasional coughing when drinking water but did not want to seek formal evaluation with speech therapy or swallow study.  He had noticed some swelling around his ankles.  He was switched by his cardiologist from Zocor to Crestor . He was advised to continue with generic Sinemet  2 pills 3 times daily.   I saw him on 01/28/2020, at which time he reported ongoing issues with intermittent constipation.  He had fallen a couple of times but thankfully without any major injuries.  We talked about the importance of fall prevention and constipation control.  He was not able to tolerate Sinemet  at 2 pills 4 times daily and had scaled back to 2 pills 3 times daily.  He was advised to continue with this. He was advised to use his cane consistently.   I saw him on 09/30/2019, at which time he reported that his balance was worse and he had 2 falls because his left knee gave out.  He had arthritis issues with the knees and is seen orthopedics.  He had seen neurosurgery in [place] in September 2020.  He felt that overall his slowness was worse.  He was advised to increase his Sinemet  to 2 pills 4 times daily from tid.      I saw him on 05/30/2019, at which time he reported ongoing problems with his low back pain.  He had tried medication including narcotic pain medication, muscle relaxer and gabapentin .  He was supposed to have a neurosurgical evaluation soon.  He had problems with constipation.  He was still taking Sinemet  2 pills 3 times daily with reasonably good results.  He also reported overall feeling more stressed.  He was advised to be more proactive about constipation issues and try to be more active physically with the help of a recumbent stationary bike if possible.   I saw him on 11/27/2018, at which time he was complaining of low back pain.  He had lumbar spine x-rays in November 2019 which showed some degenerative changes.  He had received a lumbar spine injection  under Dr. Anderson in December which he felt was not that helpful.  He felt that the back pain was enough to limit his exercise.  He had not pursued his hobby of making pens very much either.  He was advised to continue with Sinemet  2 pills 3 times daily.  He was advised to try gabapentin  low dose with gradual increase to up to 100 mg 3 times daily.    I saw him on 05/29/2018, at which time he reported having done well after interim back surgery on 03/06/2018. He also had radiofrequency surgery prior to that. He reported occasional dizziness and nausea, some loss of appetite. I suggested we change his Sinemet  from 2 pills 3 times a day to 1-1/2 pills 4 times a day. He reported falls. He was physically quite active, was going to the [facility]. His falls primarily occurred when he was turning.    11/02/2017, at which time he reported more back pain. He had undergone an injection in December 2018 but had found no relief. [place] He was active physically but had to take  a break because of back pain. He was taking Mobic for back pain. He had a slow reduction and his hemoglobin and red cell counts and I counseled him on the daily use of meloxicam.     I saw him on 05/02/2017, at which time he was complaining of lower back pain. He had seen orthopedics for this. He also had right knee pain, status post injection. He had not fallen thankfully. He was taking Sinemet  2 pills 3 times a day.   I saw him on 10/31/2016, at which time he reported doing okay, no recent new symptoms, in particular no significant memory loss or mood disorder constipation. He did have an episode of chest pain recently. He saw his cardiologist. He felt to be a little dehydrated and blood pressure was on the lower end of normal. He was noted to have some orthostatic hypotension. He was started on northera 100 mg tid, but had not filled it yet. He was trying to stay active and going to the [facility] 3 times a week. He was also interested in  pursuing boxing classes for PD. He had lost a little bit of weight and reported some loss of appetite. I suggested he continue with Sinemet  2 pills 3 times a day. Advised to change positions slowly and be mindful of his blood pressure dropping potentially with sudden changes of position and he was reminded to stay well-hydrated.   I first met him on 04/27/2016 at the request of his primary care physician, at which time he reported a prior diagnosis of Parkinson's disease in 2014 and symptoms dating back to early 2014. He was exercising regularly, he was doing well on symptomatic treatment with Sinemet  and I suggested he continue with the medication, at 2 pills 3 times a day. He was reminded to stay active physically and mentally and stable hydrated and well-nourished.    04/27/2016: He was diagnosed with Parkinson's disease in 2014. Symptoms date back to beginning of 2014, and started with speech changes, was noted to dragging his feet.  He has been on Sinemet  for the past 3 years, with gradual increase, currently 2 pills in the morning, 2 in the afternoon and 1 at night, was asked to reduce it, He is not sure why, seems to tolerate it well and believes it has helped. He is wondering however if there is any new medication available. He has not been on a dopamine agonist before from what I understand, no Azilect, no other MAO B inhibitor either.  He exercises regularly and is enrolled in the PD spinning class at 2 different [employer]. Lives with his wife of 37 years in [place], and they have 3 grown daughters, 2 GC. He is a nonsmoker, drinks alcohol infrequently, is a retired environmental manager.  I reviewed your office note from 02/08/2016. He used to see Dr. Primus, neurologist out of [facility] but Dr. Primus retired. He then saw another neurologist in the same office but she left. Prior office records from his previous neurologists are not available for my review today. We will request office records from Dr. Georgian  office, patient signed a ROI form today and is agreeable.   He denies any major memory or mood issues or sleep issues, does snore some, but no apneas reported, no hx of RBD. No FHx of PD.  He varies his C/L dose, 1st dose 6-9 AM, second dose around 12 or 1 PM and last dose around 8 PM.    He also plays golf, once  or twice a week. He tries to stay well-hydrated, may not always drink enough water however.     His Past Medical History Is Significant For: Past Medical History:  Diagnosis Date   Anemia    Arthritis    Bell's palsy    CAD in native artery 06/04/2020   RCA infarct 2013.  S/p RCA and OM PCI   Diabetes mellitus type 2 in nonobese (HCC) 06/04/2020   Diabetes mellitus without complication (HCC)    Essential hypertension 06/04/2020   Floaters in visual field    Hypertension    Myocardial infarction [facility])    Parkinson's disease (HCC)    Parkinson's disease (HCC) 06/04/2020   Pure hypercholesterolemia 06/04/2020    His Past Surgical History Is Significant For: Past Surgical History:  Procedure Laterality Date   APPENDECTOMY     back epidural   08/2017   BACK SURGERY     CORONARY ANGIOPLASTY WITH STENT PLACEMENT  08/19/2012   KNEE SURGERY     TOTAL KNEE ARTHROPLASTY Left 04/21/2023   Procedure: LEFT TOTAL KNEE ARTHROPLASTY;  Surgeon: Vernetta Lonni GRADE, MD;  Location: [facility];  Service: Orthopedics;  Laterality: Left;    His Family History Is Significant For: Family History  Problem Relation Age of Onset   Diabetes Mother    Sarcoidosis Mother    Stroke Father    Parkinson's disease Neg Hx     His Social History Is Significant For: Social History   Socioeconomic History   Marital status: Married    Spouse name: Kraig    Number of children: 3   Years of education: Not on file   Highest education level: Not on file  Occupational History   Occupation: Retired   Tobacco Use   Smoking status: Former   Smokeless tobacco: Never  Advertising Account Planner   Vaping status:  Never Used  Substance and Sexual Activity   Alcohol use: Yes    Comment: occ glass of wine for a special occasion   Drug use: No   Sexual activity: Not on file  Other Topics Concern   Not on file  Social History Narrative   Drinks about 1 cup of coffee a day    Lives with wife Ronald [name]   Left handed   Retired    Social Drivers of Health   Tobacco Use: Medium Risk (10/16/2024)   Patient History    Smoking Tobacco Use: Former    Smokeless Tobacco Use: Never    Passive Exposure: Not on Actuary Strain: Low Risk (04/26/2024)   Received from [facility]   Overall Financial Resource Strain (CARDIA)    How hard is it for you to pay for the very basics like food, housing, medical care, and heating?: Not hard at all  Food Insecurity: No Food Insecurity (04/26/2024)   Received from [facility]   Epic    Within the past 12 months, you worried that your food would run out before you got the money to buy more.: Never true    Within the past 12 months, the food you bought just didn't last and you didn't have money to get more.: Never true  Transportation Needs: No Transportation Needs (04/26/2024)   Received from [facility]    In the past 12 months, has lack of transportation kept you from medical appointments or from getting medications?: No    In the past 12 months, has lack of transportation kept you from meetings, work, or  from getting things needed for daily living?: No  Physical Activity: Insufficiently Active (04/26/2024)   Received from [facility]   Exercise Vital Sign    On average, how many days per week do you engage in moderate to strenuous exercise (like a brisk walk)?: 3 days    On average, how many minutes do you engage in exercise at this level?: 30 min  Stress: No Stress Concern Present (04/26/2024)   Received from [facility] Occupational [facility] - Occupational Stress Questionnaire    Do you feel stress - tense, restless,  nervous, or anxious, or unable to sleep at night because your mind is troubled all the time - these days?: Not at all  Social Connections: Socially Integrated (04/26/2024)   Received from [facility]   Social Network    How would you rate your social network (family, work, friends)?: Good participation with social networks  Depression (PHQ2-9): Not on file  Alcohol Screen: Not on file  Housing: Low Risk (04/26/2024)   Received from [facility]    In the last 12 months, was there a time when you were not able to pay the mortgage or rent on time?: No    In the past 12 months, how many times have you moved where you were living?: 0    At any time in the past 12 months, were you homeless or living in a shelter (including now)?: No  Utilities: Not At Risk (04/26/2024)   Received from [facility]    In the past 12 months has the electric, gas, oil, or water company threatened to shut off services in your home?: No  Health Literacy: Not on file    His Allergies Are:  Allergies[1]:   His Current Medications Are:  Outpatient Encounter Medications as of 10/16/2024  Medication Sig   aspirin  EC 81 MG tablet Take 81 mg by mouth daily. Swallow whole.   carbidopa -levodopa  (SINEMET  IR) 25-100 MG tablet TAKE 2 TABLETS AT 9AM, THEN TAKE ONE TAB AT 12PM NOON, AND TAKE 2 TABS AT 3PM, THEN TAKE ONE TAB AT 6PM, AND TAKE 2 TABS AT 9PM   lisinopril  (ZESTRIL ) 5 MG tablet Take 1 tablet by mouth once daily   metoprolol  succinate (TOPROL -XL) 25 MG 24 hr tablet Take 25 mg by mouth daily.   naproxen sodium (ALEVE) 220 MG tablet Take 220 mg by mouth 2 (two) times daily as needed (pain). (Patient taking differently: Take 220 mg by mouth as needed (pain).)   rosuvastatin  (CRESTOR ) 20 MG tablet Take 1 tablet by mouth once daily   sertraline  (ZOLOFT ) 50 MG tablet Take 50 mg by mouth daily at 6 PM.   No facility-administered encounter medications on file as of 10/16/2024.  :   Review of Systems:  Out  of a complete 14 point review of systems, all are reviewed and negative with the exception of these symptoms as listed below:   Review of Systems  Objective:  Neurological Exam  Physical Exam Physical Examination:   Vitals:   10/16/24 1010  BP: 101/61  Pulse: 67    General Examination: The patient is a very pleasant 74 y.o. male in no acute distress. He appears deconditioned.  Well-groomed.  Good spirits.   HEENT: Normocephalic, atraumatic, pupils are equal, round and reactive to light, extraocular tracking is mild to moderately impaired, face is mildly asymmetric but stable, moderate facial masking and left hemifacial spasms noted, left blepharospasm noted,  all appear stable.  Moderate to severe hypophonia and mild intermittent dysarthria, also stable.  Neck is moderately rigid.  Airway examination reveals stable findings, mild mouth dryness, adequate dental hygiene, tongue protrudes centrally and palate elevates symmetrically.  No obvious sialorrhea.  No carotid bruits.   Chest: Clear to auscultation without wheezing, rhonchi or crackles noted.   Heart: S1+S2+0, regular and normal without murmurs, rubs or gallops noted.    Abdomen: Soft, non-tender and non-distended.   Extremities: There is non-pitting puffiness in the lower extremities today, right more than left, could be from the AFO.    Skin: Warm and dry without trophic changes noted.   Musculoskeletal: exam reveals right knee pain.  Status post left knee replacement.  Decreased range of motion in left shoulder and crepitation noted with passive range of motion.    Neurologically:  Mental status: The patient is awake, alert and oriented in all 4 spheres. His immediate and remote memory, attention, language skills and fund of knowledge are appropriate. There is no evidence of aphasia, agnosia, apraxia or anomia. Thought process is linear. Mood is normal and affect is normal.  Some bradyphrenia noted. Cranial nerves II - XII  are as described above under HEENT exam.  Motor exam: Thinner muscle bulk, increased tone in the upper extremities, mild lower extremity dyskinesias.  Moderate impairment of fine motor skills. Overall moderate bradykinesia.  Intermittent resting tremor in both upper extremities. Gait, station, balance: He stands with mild to moderate difficulty, and pushes himself up, slightly wider stance, he has a moderately stooped posture, mild right upper body tilt.  He walks with a 2 wheeled walker.     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  Has seen pain management through [facility] and received a facet injection into the lumbar spine in December 2025 with some improvement noted.  He is advised to follow-up with his PCP regarding seeing orthopedics for his left shoulder pain.  He does have decreased range of motion in the left shoulder and worsening pain.  He is advised to use his walker at all times.  We talked about the importance of supportive care and fall prevention at length today.  I would like to prescribe a rolling walker with seat that he can use outside if need be.  He has an upright walker which is difficult to maneuver inside the home or outside for shorter distances.   He is advised to continue with his levodopa  which is currently 2 pills alternating with 1 pill for a total of 5 doses, 8 pills/day.  Talked about the challenges of advancing  Parkinson's disease in general.  He has developed mild dyskinesias which appear to be stable.  He is encouraged to increase his water intake to about 6 cups of water per day, if not 8 or 3-4 bottles, 16.9 ounce size each.  He is advised to follow-up to see the nurse practitioner in about 6 to 8 months, sooner if needed.  I answered all his questions today and  he was in agreement.  I spent 35 minutes in total face-to-face time and in reviewing records during pre-charting, more than 50% of which was spent in counseling and coordination of care, reviewing test results, reviewing medications and treatment regimen and/or in discussing or reviewing the diagnosis of Parkinson's disease, recurrent falls, the prognosis and treatment options. Pertinent laboratory and imaging test results that were available during this visit with the patient were reviewed by me and considered in my medical decision making (see chart for details).        [1]  Allergies Allergen Reactions   Sulfamethoxazole-Trimethoprim Swelling    Pt stated his lips became swollen

## 2024-10-21 ENCOUNTER — Other Ambulatory Visit: Payer: Self-pay | Admitting: Cardiovascular Disease

## 2024-10-24 MED ORDER — LISINOPRIL 5 MG PO TABS: 5.0000 mg | ORAL_TABLET | Freq: Every day | ORAL | 1 refills | Status: AC

## 2024-10-24 MED ORDER — ROSUVASTATIN CALCIUM 20 MG PO TABS: 20.0000 mg | ORAL_TABLET | Freq: Every day | ORAL | 1 refills | Status: AC

## 2024-10-24 NOTE — Addendum Note (Signed)
 Addended by: BLUFORD RAMP D on: 10/24/2024 12:07 PM   Modules accepted: Orders

## 2024-10-24 NOTE — Telephone Encounter (Signed)
 Pt had labs done on 10/15/2024 within 365 days within normal range in [facility] and a lipid panel within 12 months in [facility] on 04/22/2024

## 2024-12-17 ENCOUNTER — Ambulatory Visit: Admitting: Cardiology

## 2025-04-15 ENCOUNTER — Ambulatory Visit: Admitting: Neurology
